# Patient Record
Sex: Male | Born: 1950
Health system: Southern US, Community
[De-identification: ages and names within clinical notes are randomized; demographics above are authoritative.]

## PROBLEM LIST (undated history)

## (undated) DIAGNOSIS — I10 Essential (primary) hypertension: Secondary | ICD-10-CM

## (undated) DIAGNOSIS — I2510 Atherosclerotic heart disease of native coronary artery without angina pectoris: Secondary | ICD-10-CM

## (undated) DIAGNOSIS — J8410 Pulmonary fibrosis, unspecified: Secondary | ICD-10-CM

## (undated) DIAGNOSIS — K8020 Calculus of gallbladder without cholecystitis without obstruction: Secondary | ICD-10-CM

## (undated) DIAGNOSIS — G4730 Sleep apnea, unspecified: Secondary | ICD-10-CM

## (undated) DIAGNOSIS — D126 Benign neoplasm of colon, unspecified: Secondary | ICD-10-CM

## (undated) DIAGNOSIS — B20 Human immunodeficiency virus [HIV] disease: Secondary | ICD-10-CM

## (undated) DIAGNOSIS — R9430 Abnormal result of cardiovascular function study, unspecified: Secondary | ICD-10-CM

## (undated) DIAGNOSIS — R002 Palpitations: Secondary | ICD-10-CM

## (undated) DIAGNOSIS — K7460 Unspecified cirrhosis of liver: Secondary | ICD-10-CM

## (undated) DIAGNOSIS — Z21 Asymptomatic human immunodeficiency virus [HIV] infection status: Secondary | ICD-10-CM

## (undated) DIAGNOSIS — Z9861 Coronary angioplasty status: Secondary | ICD-10-CM

## (undated) DIAGNOSIS — J984 Other disorders of lung: Secondary | ICD-10-CM

## (undated) DIAGNOSIS — N200 Calculus of kidney: Secondary | ICD-10-CM

## (undated) DIAGNOSIS — B1920 Unspecified viral hepatitis C without hepatic coma: Secondary | ICD-10-CM

## (undated) DIAGNOSIS — E785 Hyperlipidemia, unspecified: Secondary | ICD-10-CM

## (undated) DIAGNOSIS — E782 Mixed hyperlipidemia: Secondary | ICD-10-CM

## (undated) DIAGNOSIS — E119 Type 2 diabetes mellitus without complications: Secondary | ICD-10-CM

## (undated) DIAGNOSIS — R0602 Shortness of breath: Secondary | ICD-10-CM

## (undated) DIAGNOSIS — I350 Nonrheumatic aortic (valve) stenosis: Secondary | ICD-10-CM

## (undated) DIAGNOSIS — K648 Other hemorrhoids: Secondary | ICD-10-CM

## (undated) HISTORY — DX: Nonrheumatic aortic (valve) stenosis: I35.0

## (undated) HISTORY — DX: Palpitations: R00.2

## (undated) HISTORY — DX: Essential (primary) hypertension: I10

## (undated) HISTORY — DX: Unspecified cirrhosis of liver: K74.60

## (undated) HISTORY — DX: Hyperlipidemia, unspecified: E78.5

## (undated) HISTORY — DX: Other hemorrhoids: K64.8

## (undated) HISTORY — DX: Atherosclerotic heart disease of native coronary artery without angina pectoris: I25.10

## (undated) HISTORY — DX: Abnormal result of cardiovascular function study, unspecified: R94.30

## (undated) HISTORY — PX: POLYPECTOMY: SHX149

## (undated) HISTORY — DX: Coronary angioplasty status: Z98.61

## (undated) HISTORY — DX: Other disorders of lung: J98.4

## (undated) HISTORY — PX: PERCUTANEOUS CORONARY STENT INTERVENTION (PCI-S): SHX6016

## (undated) HISTORY — DX: Calculus of gallbladder without cholecystitis without obstruction: K80.20

## (undated) HISTORY — PX: CHOLECYSTECTOMY: SHX55

## (undated) HISTORY — DX: Mixed hyperlipidemia: E78.2

## (undated) HISTORY — DX: Shortness of breath: R06.02

## (undated) HISTORY — DX: Sleep apnea, unspecified: G47.30

## (undated) HISTORY — DX: Asymptomatic human immunodeficiency virus (hiv) infection status: Z21

## (undated) HISTORY — DX: Human immunodeficiency virus (HIV) disease: B20

## (undated) HISTORY — DX: Calculus of kidney: N20.0

## (undated) HISTORY — PX: OTHER SURGICAL HISTORY: SHX169

## (undated) HISTORY — DX: Type 2 diabetes mellitus without complications: E11.9

## (undated) HISTORY — DX: Benign neoplasm of colon, unspecified: D12.6

## (undated) HISTORY — PX: UMBILICAL HERNIA REPAIR: SHX196

## (undated) HISTORY — DX: Unspecified viral hepatitis C without hepatic coma: B19.20

---

## 2013-04-23 HISTORY — PX: CARDIAC CATHETERIZATION: SHX172

## 2013-05-02 HISTORY — PX: COLONOSCOPY: SHX174

## 2016-04-02 DIAGNOSIS — B20 Human immunodeficiency virus [HIV] disease: Secondary | ICD-10-CM | POA: Diagnosis not present

## 2016-04-06 DIAGNOSIS — E782 Mixed hyperlipidemia: Secondary | ICD-10-CM | POA: Diagnosis not present

## 2016-04-06 DIAGNOSIS — I340 Nonrheumatic mitral (valve) insufficiency: Secondary | ICD-10-CM | POA: Diagnosis not present

## 2016-04-06 DIAGNOSIS — I2510 Atherosclerotic heart disease of native coronary artery without angina pectoris: Secondary | ICD-10-CM | POA: Diagnosis not present

## 2016-04-06 DIAGNOSIS — I10 Essential (primary) hypertension: Secondary | ICD-10-CM | POA: Diagnosis not present

## 2016-04-13 DIAGNOSIS — E559 Vitamin D deficiency, unspecified: Secondary | ICD-10-CM | POA: Diagnosis not present

## 2016-04-13 DIAGNOSIS — B20 Human immunodeficiency virus [HIV] disease: Secondary | ICD-10-CM | POA: Diagnosis not present

## 2016-04-13 DIAGNOSIS — R739 Hyperglycemia, unspecified: Secondary | ICD-10-CM | POA: Diagnosis not present

## 2016-04-13 DIAGNOSIS — E782 Mixed hyperlipidemia: Secondary | ICD-10-CM | POA: Diagnosis not present

## 2016-05-06 DIAGNOSIS — Z0000 Encounter for general adult medical examination without abnormal findings: Secondary | ICD-10-CM | POA: Diagnosis not present

## 2016-05-06 DIAGNOSIS — E119 Type 2 diabetes mellitus without complications: Secondary | ICD-10-CM | POA: Diagnosis not present

## 2016-05-06 DIAGNOSIS — E785 Hyperlipidemia, unspecified: Secondary | ICD-10-CM | POA: Diagnosis not present

## 2016-05-06 DIAGNOSIS — Z7189 Other specified counseling: Secondary | ICD-10-CM | POA: Diagnosis not present

## 2016-05-06 DIAGNOSIS — B20 Human immunodeficiency virus [HIV] disease: Secondary | ICD-10-CM | POA: Diagnosis not present

## 2016-05-06 DIAGNOSIS — M2550 Pain in unspecified joint: Secondary | ICD-10-CM | POA: Diagnosis not present

## 2016-07-27 DIAGNOSIS — D225 Melanocytic nevi of trunk: Secondary | ICD-10-CM | POA: Diagnosis not present

## 2016-07-27 DIAGNOSIS — L918 Other hypertrophic disorders of the skin: Secondary | ICD-10-CM | POA: Diagnosis not present

## 2016-07-27 DIAGNOSIS — D485 Neoplasm of uncertain behavior of skin: Secondary | ICD-10-CM | POA: Diagnosis not present

## 2016-07-27 DIAGNOSIS — L821 Other seborrheic keratosis: Secondary | ICD-10-CM | POA: Diagnosis not present

## 2016-07-27 DIAGNOSIS — L919 Hypertrophic disorder of the skin, unspecified: Secondary | ICD-10-CM | POA: Diagnosis not present

## 2016-07-27 DIAGNOSIS — L570 Actinic keratosis: Secondary | ICD-10-CM | POA: Diagnosis not present

## 2016-07-27 DIAGNOSIS — L812 Freckles: Secondary | ICD-10-CM | POA: Diagnosis not present

## 2016-08-10 DIAGNOSIS — R5381 Other malaise: Secondary | ICD-10-CM | POA: Diagnosis not present

## 2016-08-10 DIAGNOSIS — E559 Vitamin D deficiency, unspecified: Secondary | ICD-10-CM | POA: Diagnosis not present

## 2016-08-10 DIAGNOSIS — E119 Type 2 diabetes mellitus without complications: Secondary | ICD-10-CM | POA: Diagnosis not present

## 2016-08-10 DIAGNOSIS — B20 Human immunodeficiency virus [HIV] disease: Secondary | ICD-10-CM | POA: Diagnosis not present

## 2016-08-28 DIAGNOSIS — E785 Hyperlipidemia, unspecified: Secondary | ICD-10-CM | POA: Diagnosis not present

## 2016-08-28 DIAGNOSIS — E119 Type 2 diabetes mellitus without complications: Secondary | ICD-10-CM | POA: Diagnosis not present

## 2016-08-28 DIAGNOSIS — B20 Human immunodeficiency virus [HIV] disease: Secondary | ICD-10-CM | POA: Diagnosis not present

## 2016-08-28 DIAGNOSIS — N401 Enlarged prostate with lower urinary tract symptoms: Secondary | ICD-10-CM | POA: Diagnosis not present

## 2016-10-08 DIAGNOSIS — G4733 Obstructive sleep apnea (adult) (pediatric): Secondary | ICD-10-CM | POA: Diagnosis not present

## 2016-10-08 DIAGNOSIS — Z113 Encounter for screening for infections with a predominantly sexual mode of transmission: Secondary | ICD-10-CM | POA: Diagnosis not present

## 2016-10-08 DIAGNOSIS — Z111 Encounter for screening for respiratory tuberculosis: Secondary | ICD-10-CM | POA: Diagnosis not present

## 2016-10-08 DIAGNOSIS — R011 Cardiac murmur, unspecified: Secondary | ICD-10-CM | POA: Diagnosis not present

## 2016-10-08 DIAGNOSIS — B20 Human immunodeficiency virus [HIV] disease: Secondary | ICD-10-CM | POA: Diagnosis not present

## 2016-10-22 DIAGNOSIS — I10 Essential (primary) hypertension: Secondary | ICD-10-CM | POA: Diagnosis not present

## 2016-10-22 DIAGNOSIS — I2510 Atherosclerotic heart disease of native coronary artery without angina pectoris: Secondary | ICD-10-CM | POA: Diagnosis not present

## 2016-10-22 DIAGNOSIS — R0602 Shortness of breath: Secondary | ICD-10-CM | POA: Diagnosis not present

## 2016-10-22 DIAGNOSIS — Z9861 Coronary angioplasty status: Secondary | ICD-10-CM | POA: Diagnosis not present

## 2016-10-26 DIAGNOSIS — I350 Nonrheumatic aortic (valve) stenosis: Secondary | ICD-10-CM | POA: Diagnosis not present

## 2016-10-26 DIAGNOSIS — I517 Cardiomegaly: Secondary | ICD-10-CM | POA: Diagnosis not present

## 2016-11-06 DIAGNOSIS — Z23 Encounter for immunization: Secondary | ICD-10-CM | POA: Diagnosis not present

## 2016-12-29 DIAGNOSIS — E119 Type 2 diabetes mellitus without complications: Secondary | ICD-10-CM | POA: Diagnosis not present

## 2016-12-29 DIAGNOSIS — R5381 Other malaise: Secondary | ICD-10-CM | POA: Diagnosis not present

## 2016-12-29 DIAGNOSIS — Z111 Encounter for screening for respiratory tuberculosis: Secondary | ICD-10-CM | POA: Diagnosis not present

## 2016-12-29 DIAGNOSIS — E559 Vitamin D deficiency, unspecified: Secondary | ICD-10-CM | POA: Diagnosis not present

## 2016-12-29 DIAGNOSIS — B20 Human immunodeficiency virus [HIV] disease: Secondary | ICD-10-CM | POA: Diagnosis not present

## 2016-12-29 DIAGNOSIS — E785 Hyperlipidemia, unspecified: Secondary | ICD-10-CM | POA: Diagnosis not present

## 2016-12-29 DIAGNOSIS — Z113 Encounter for screening for infections with a predominantly sexual mode of transmission: Secondary | ICD-10-CM | POA: Diagnosis not present

## 2017-01-25 DIAGNOSIS — M1712 Unilateral primary osteoarthritis, left knee: Secondary | ICD-10-CM | POA: Diagnosis not present

## 2017-01-25 DIAGNOSIS — M170 Bilateral primary osteoarthritis of knee: Secondary | ICD-10-CM | POA: Diagnosis not present

## 2017-01-25 DIAGNOSIS — M1711 Unilateral primary osteoarthritis, right knee: Secondary | ICD-10-CM | POA: Diagnosis not present

## 2017-04-02 ENCOUNTER — Other Ambulatory Visit: Payer: Medicare Other

## 2017-04-02 ENCOUNTER — Ambulatory Visit: Payer: Medicare Other

## 2017-04-02 ENCOUNTER — Other Ambulatory Visit (HOSPITAL_COMMUNITY)
Admission: RE | Admit: 2017-04-02 | Discharge: 2017-04-02 | Disposition: A | Discharge status: Home / Self Care | Payer: Medicare Other | Source: Ambulatory Visit | Attending: Family | Admitting: Family

## 2017-04-02 DIAGNOSIS — Z79899 Other long term (current) drug therapy: Secondary | ICD-10-CM

## 2017-04-02 DIAGNOSIS — B20 Human immunodeficiency virus [HIV] disease: Secondary | ICD-10-CM

## 2017-04-02 LAB — T-HELPER CELL (CD4) - (RCID CLINIC ONLY)
CD4 % Helper T Cell: 51 % (ref 33–55)
CD4 T Cell Abs: 690 /uL (ref 400–2700)

## 2017-04-03 LAB — URINALYSIS
BILIRUBIN URINE: NEGATIVE
Hgb urine dipstick: NEGATIVE
Ketones, ur: NEGATIVE
Leukocytes, UA: NEGATIVE
Nitrite: NEGATIVE
PROTEIN: NEGATIVE
Specific Gravity, Urine: 1.04 — ABNORMAL HIGH (ref 1.001–1.03)
pH: 6.5 (ref 5.0–8.0)

## 2017-04-03 LAB — URINE CYTOLOGY ANCILLARY ONLY
CHLAMYDIA, DNA PROBE: NEGATIVE
Neisseria Gonorrhea: NEGATIVE

## 2017-04-06 LAB — HIV-1 RNA ULTRAQUANT REFLEX TO GENTYP+

## 2017-04-07 LAB — CBC WITH DIFFERENTIAL/PLATELET
Basophils Absolute: 48 cells/uL (ref 0–200)
Basophils Relative: 1 %
EOS PCT: 2.9 %
Eosinophils Absolute: 139 cells/uL (ref 15–500)
HCT: 44.2 % (ref 38.5–50.0)
Hemoglobin: 15.4 g/dL (ref 13.2–17.1)
Lymphs Abs: 1258 cells/uL (ref 850–3900)
MCH: 32.1 pg (ref 27.0–33.0)
MCHC: 34.8 g/dL (ref 32.0–36.0)
MCV: 92.1 fL (ref 80.0–100.0)
MONOS PCT: 8.7 %
MPV: 10.6 fL (ref 7.5–12.5)
Neutro Abs: 2938 cells/uL (ref 1500–7800)
Neutrophils Relative %: 61.2 %
PLATELETS: 105 10*3/uL — AB (ref 140–400)
RBC: 4.8 10*6/uL (ref 4.20–5.80)
RDW: 13.3 % (ref 11.0–15.0)
TOTAL LYMPHOCYTE: 26.2 %
WBC mixed population: 418 cells/uL (ref 200–950)
WBC: 4.8 10*3/uL (ref 3.8–10.8)

## 2017-04-07 LAB — COMPLETE METABOLIC PANEL WITH GFR
AG Ratio: 1.5 (calc) (ref 1.0–2.5)
ALKALINE PHOSPHATASE (APISO): 105 U/L (ref 40–115)
ALT: 29 U/L (ref 9–46)
AST: 39 U/L — AB (ref 10–35)
Albumin: 3.8 g/dL (ref 3.6–5.1)
BUN: 17 mg/dL (ref 7–25)
CO2: 30 mmol/L (ref 20–32)
Calcium: 9.3 mg/dL (ref 8.6–10.3)
Chloride: 103 mmol/L (ref 98–110)
Creat: 0.7 mg/dL (ref 0.70–1.25)
GFR, Est African American: 114 mL/min/{1.73_m2} (ref >=60)
GFR, Est Non African American: 98 mL/min/{1.73_m2} (ref >=60)
GLOBULIN: 2.6 g/dL (ref 1.9–3.7)
GLUCOSE: 118 mg/dL — AB (ref 65–99)
Potassium: 4.3 mmol/L (ref 3.5–5.3)
SODIUM: 140 mmol/L (ref 135–146)
Total Bilirubin: 2.6 mg/dL — ABNORMAL HIGH (ref 0.2–1.2)
Total Protein: 6.4 g/dL (ref 6.1–8.1)

## 2017-04-07 LAB — QUANTIFERON-TB GOLD PLUS
MITOGEN-NIL: 8.4 [IU]/mL
NIL: 0.02 IU/mL
QuantiFERON-TB Gold Plus: NEGATIVE
TB1-NIL: 0.03 IU/mL
TB2-NIL: 0.03 IU/mL

## 2017-04-07 LAB — HEPATITIS B SURFACE ANTIGEN: Hepatitis B Surface Ag: NONREACTIVE

## 2017-04-07 LAB — LIPID PANEL
CHOL/HDL RATIO: 1.4 (calc) (ref <5.0)
CHOLESTEROL: 126 mg/dL (ref <200)
HDL: 90 mg/dL (ref >40)
LDL Cholesterol (Calc): 23 mg/dL (calc)
Non-HDL Cholesterol (Calc): 36 mg/dL (calc) (ref <130)
TRIGLYCERIDES: 47 mg/dL (ref <150)

## 2017-04-07 LAB — HEPATITIS C ANTIBODY
Hepatitis C Ab: REACTIVE — AB
SIGNAL TO CUT-OFF: 18 — ABNORMAL HIGH (ref <1.00)

## 2017-04-07 LAB — HEPATITIS B CORE ANTIBODY, TOTAL: Hep B Core Total Ab: REACTIVE — AB

## 2017-04-07 LAB — HCV RNA,QUANTITATIVE REAL TIME PCR
HCV QUANT LOG: NOT DETECTED {Log_IU}/mL
HCV RNA, PCR, QN: NOT DETECTED [IU]/mL

## 2017-04-07 LAB — RPR: RPR Ser Ql: NONREACTIVE

## 2017-04-07 LAB — HEPATITIS B SURFACE ANTIBODY,QUALITATIVE: Hep B S Ab: NONREACTIVE

## 2017-04-07 LAB — HLA B*5701: HLA-B 5701 W/RFLX HLA-B HIGH: NEGATIVE

## 2017-04-07 LAB — HEPATITIS A ANTIBODY, TOTAL: HEPATITIS A AB,TOTAL: REACTIVE — AB

## 2017-04-13 NOTE — Progress Notes (Signed)
Subjective:    Patient ID: Tyler Hendricks, male    DOB: 1950-12-20, 67 y.o.   MRN: [id number]  Chief Complaint  Patient presents with  . HIV Positive/AIDS  . Knee Pain  . Hypertension  . Diabetes    HPI:  Tyler Hendricks is a 67 y.o. male who presents today to establish care for HIV care from his previous provider in [place], [place].  1.) HIV - Tyler Hendricks has been HIV positive since August of 1989 when he was screened for an insurance physical. His presumed risk factor was a long history of heroin usage which he indicates he has not used since 1976. HIs CD4 count at the time was 510 and was not started on ART as his CD4 was greater than 500. Over the course of time he has been on several ART regimens including Zerit/Stauvadine-Epivir, Atripla, Complera, and Odefsey. He did have some increasing numbness and tingling of his distal lower extremities likely associated with the Zerit. He has been well controlled with his regimen of Odefsey. His previous medical records were reviewed in detail.   He continues to take the [facility] as prescribed and denies adverse side effects or missed dosages. Denies symptoms of OI including fevers, chills, headaches, nausea, vomiting, or diarrhea. Night sweats on occasion.   2.) Hypertension - Currently prescribed Hyzaar and atenolol. Reports taking the medication as prescribed and denies adverse side effects. Blood pressures at home are. Denies headache, changes in vision, chest pain, or shortness of breath.  3.) Type 2 diabetes - Currently prescribed Januvia and Synjardy. Reports taking the medications as prescribed and denies adverse side effects. Blood sugars have been  4.) Health maintenance - He has received 2 dosages of Pneumovax (06/22/14, 03/03/2013) and his last Tdap was 06/23/13. Previous tested positive for Hepatitis C and completed treatment with most recent lab work showing no Hep C RNA present.   5.) Chronic knee pain -  Previously diagnosed with  osteoarthritis of the bilateral knees. He was previously treated with cortisone injections and occasional percocet. Not currently on medications. Described as stiff in the morning and improves and then worsens towards the end of the day. Has increased levels of pain with weather.    Allergies  Allergen Reactions  . Lopressor [Metoprolol Tartrate] Rash      Outpatient Medications Prior to Visit  Medication Sig Dispense Refill  . aspirin 81 MG tablet Take 81 mg by mouth daily.    . Multiple Vitamin (MULTIVITAMIN) tablet Take 1 tablet by mouth daily.    Marland Kitchen omega-3 acid ethyl esters (LOVAZA) 1 g capsule Take 1 g by mouth daily. Take 3 caps orally Every Morning    . amLODipine (NORVASC) 2.5 MG tablet TK 1 T PO QD  2  . BREO ELLIPTA 200-25 MCG/INH AEPB INL 1 PUFF PO D  12  . clopidogrel (PLAVIX) 75 MG tablet Take 75 mg by mouth daily.  2  . JANUVIA 100 MG tablet TK 1 T PO QD  3  . losartan-hydrochlorothiazide (HYZAAR) 100-25 MG tablet Take by mouth daily.  3  . predniSONE (STERAPRED UNI-PAK 21 TAB) 10 MG (21) TBPK tablet FPD  0  . rosuvastatin (CRESTOR) 10 MG tablet Take 20 mg by mouth daily.  3  . SHINGRIX injection ADM 0.5ML IM UTD  0  . SYNJARDY XR 12.07-998 MG TB24 TK 2 TS PO QD  3  . VASCEPA 1 g CAPS TK 2 CS PO BID  11  . Vitamin D,  Ergocalciferol, (DRISDOL) 50000 units CAPS capsule TK 1 C PO WEEKLY  3  . atenolol (TENORMIN) 50 MG tablet Take 50 mg by mouth daily.  3  . oxyCODONE-acetaminophen (PERCOCET) 10-325 MG tablet TK 1 T PO QID PRN  0  . oxyCODONE-acetaminophen (PERCOCET/ROXICET) 5-325 MG tablet TK 1 T PO QID PRN  0   No facility-administered medications prior to visit.      Past Medical History:  Diagnosis Date  . CAD (coronary artery disease)   . Diabetes mellitus without complication ([place])   . Hepatitis C   . HIV infection ([place])   . Hyperlipidemia   . Hypertension   . Sleep apnea     Past Surgical History:  Procedure Laterality Date  . PERCUTANEOUS CORONARY  STENT INTERVENTION (PCI-S)       Family History  Problem Relation Age of Onset  . Emphysema Mother   . Heart attack Father 31     Social History   Socioeconomic History  . Marital status: Married    Spouse name: Not on file  . Number of children: 2  . Years of education: Not on file  . Highest education level: 10th grade  Social Needs  . Financial resource strain: Not on file  . Food insecurity - worry: Never true  . Food insecurity - inability: Never true  . Transportation needs - medical: No  . Transportation needs - non-medical: No  Occupational History  . Not on file  Tobacco Use  . Smoking status: Never Smoker  . Smokeless tobacco: Never Used  Substance and Sexual Activity  . Alcohol use: Yes    Frequency: Never    Comment: Occasional  . Drug use: No  . Sexual activity: Yes  Other Topics Concern  . Not on file  Social History Narrative  . Not on file      Review of Systems  Constitutional: Negative for chills, fatigue and fever.  Respiratory: Negative for cough, chest tightness, shortness of breath and wheezing.   Cardiovascular: Negative for chest pain, palpitations and leg swelling.  Gastrointestinal: Negative for abdominal distention, abdominal pain, anal bleeding, blood in stool, constipation, diarrhea and nausea.  Genitourinary: Negative for discharge, dysuria, flank pain, frequency, hematuria, penile pain, penile swelling, testicular pain and urgency.  Neurological: Negative for headaches.       Objective:    BP 122/87   Pulse 65   Temp 98.3 F (36.8 C) (Oral)   Ht 6\' 1"  (1.854 m)   Wt 220 lb (99.8 kg)   BMI 29.03 kg/m  Nursing note and vital signs reviewed.  Physical Exam  Constitutional: He is oriented to person, place, and time. He appears well-developed and well-nourished. No distress.  HENT:  Mouth/Throat: Oropharynx is clear and moist.  Neck: Neck supple.  Cardiovascular: Normal rate, regular rhythm, normal heart sounds and  intact distal pulses. Exam reveals no gallop and no friction rub.  No murmur heard. Pulmonary/Chest: Effort normal and breath sounds normal. No respiratory distress. He has no wheezes. He has no rales. He exhibits no tenderness.  Abdominal: Soft. Bowel sounds are normal. He exhibits no distension.  Musculoskeletal:  Bilateral knees - No obvious defomity, discoloration or edema. Range of motion normal. Distal pulses and sensation are intact and appropriate.   Lymphadenopathy:    He has no cervical adenopathy.  Neurological: He is alert and oriented to person, place, and time.  Skin: Skin is warm and dry.  Psychiatric: He has a normal mood and affect. His  behavior is normal. Judgment and thought content normal.        Assessment & Plan:   Problem List Items Addressed This Visit      Cardiovascular and Mediastinum   Essential hypertension    Well controlled today. Continue losartan-hctz and amlodipine. Encouraged to monitor blood pressure at home and follow low sodium diet. Follow up with primary care.       Relevant Medications   rosuvastatin (CRESTOR) 10 MG tablet   losartan-hydrochlorothiazide (HYZAAR) 100-25 MG tablet   VASCEPA 1 g CAPS   amLODipine (NORVASC) 2.5 MG tablet   aspirin 81 MG tablet   omega-3 acid ethyl esters (LOVAZA) 1 g capsule     Endocrine   Type 2 diabetes mellitus with hyperglycemia, without long-term current use of insulin (HCC)    Continue Synjardy and Januvia. Referred to primary care for further management.       Relevant Medications   rosuvastatin (CRESTOR) 10 MG tablet   JANUVIA 100 MG tablet   losartan-hydrochlorothiazide (HYZAAR) 100-25 MG tablet   SYNJARDY XR 12.07-998 MG TB24   aspirin 81 MG tablet   Other Relevant Orders   Ambulatory referral to Internal Medicine     Musculoskeletal and Integument   Primary osteoarthritis of both knees    Symptoms and exam consistent with primary osteoarthritis of bilateral knees. Previous cortisone  injections helped. Discussed possibility of hyaluronic acid injections with recommended follow-up with orthopedics/sports medicine. He is on Plavix which limits his anti-inflammatory medication due to increased risk of bleeding. Previously on Percocet. Will prescribe small dose of tramadol as needed 1 time. Continue non-pharmacological treatments.      Relevant Medications   traMADol (ULTRAM) 50 MG tablet   predniSONE (STERAPRED UNI-PAK 21 TAB) 10 MG (21) TBPK tablet   aspirin 81 MG tablet   Other Relevant Orders   Ambulatory referral to Internal Medicine     Other   HIV disease ([place]) - Primary    HIV is stable with current medication regimen of Odefsey. VL is undetectable and CD4 count of 690. No adverse side effects for evidence of opportunistic infection. RPR completed and negative. Due for a booster dosage of Pneumovax. Continue Odefsey with follow up CD4 count and VL in 6 months.       Relevant Medications   SHINGRIX injection   Other Relevant Orders   T-helper cell (CD4)- ([facility] clinic only)   HIV 1 RNA quant-no reflex-bld    Other Visit Diagnoses    Healthcare maintenance           I have discontinued Denney Juarez's atenolol, oxyCODONE-acetaminophen, and oxyCODONE-acetaminophen. I am also having him start on traMADol. Additionally, I am having him maintain his clopidogrel, rosuvastatin, SHINGRIX, JANUVIA, predniSONE, losartan-hydrochlorothiazide, VASCEPA, BREO ELLIPTA, Vitamin D (Ergocalciferol), SYNJARDY XR, amLODipine, aspirin, omega-3 acid ethyl esters, and multivitamin.   Meds ordered this encounter  Medications  . traMADol (ULTRAM) 50 MG tablet    Sig: Take 1 tablet (50 mg total) by mouth every 6 (six) hours as needed for severe pain.    Dispense:  10 tablet    Refill:  0    Order Specific Question:   Supervising Provider    Answer:   Carlyle Basques [[id number]]     Follow-up: Return in about 6 months (around 10/13/2017), or if symptoms worsen or fail to  improve.  Mauricio Po, [place] for Infectious Disease

## 2017-04-15 ENCOUNTER — Encounter: Payer: Self-pay | Admitting: Family

## 2017-04-15 ENCOUNTER — Ambulatory Visit (INDEPENDENT_AMBULATORY_CARE_PROVIDER_SITE_OTHER): Payer: Medicare Other | Admitting: Pharmacist

## 2017-04-15 ENCOUNTER — Ambulatory Visit (INDEPENDENT_AMBULATORY_CARE_PROVIDER_SITE_OTHER): Payer: Medicare Other | Admitting: Family

## 2017-04-15 VITALS — BP 122/87 | HR 65 | Temp 98.3°F | Ht 73.0 in | Wt 220.0 lb

## 2017-04-15 DIAGNOSIS — B20 Human immunodeficiency virus [HIV] disease: Secondary | ICD-10-CM | POA: Diagnosis not present

## 2017-04-15 DIAGNOSIS — M170 Bilateral primary osteoarthritis of knee: Secondary | ICD-10-CM | POA: Diagnosis not present

## 2017-04-15 DIAGNOSIS — E1165 Type 2 diabetes mellitus with hyperglycemia: Secondary | ICD-10-CM

## 2017-04-15 DIAGNOSIS — I10 Essential (primary) hypertension: Secondary | ICD-10-CM

## 2017-04-15 DIAGNOSIS — Z0000 Encounter for general adult medical examination without abnormal findings: Secondary | ICD-10-CM

## 2017-04-15 DIAGNOSIS — E118 Type 2 diabetes mellitus with unspecified complications: Secondary | ICD-10-CM | POA: Insufficient documentation

## 2017-04-15 MED ORDER — TRAMADOL HCL 50 MG PO TABS: 50.0000 mg | ORAL_TABLET | Freq: Four times a day (QID) | ORAL | 0 refills | Status: DC | PRN

## 2017-04-15 MED ORDER — EMTRICITAB-RILPIVIR-TENOFOV AF 200-25-25 MG PO TABS: 1.0000 | ORAL_TABLET | Freq: Every day | ORAL | 11 refills | Status: DC

## 2017-04-15 MED FILL — traMADol HCL 50 MG TABS: 50 | 2 days supply | Qty: 10 | Fill #0

## 2017-04-15 NOTE — Assessment & Plan Note (Signed)
Symptoms and exam consistent with primary osteoarthritis of bilateral knees. Previous cortisone injections helped. Discussed possibility of hyaluronic acid injections with recommended follow-up with orthopedics/sports medicine. He is on Plavix which limits his anti-inflammatory medication due to increased risk of bleeding. Previously on Percocet. Will prescribe small dose of tramadol as needed 1 time. Continue non-pharmacological treatments.

## 2017-04-15 NOTE — Patient Instructions (Signed)
Very nice to meet you!  We will continue your Odefsey.  Be sure to take it with a full meal of at least 300 calories and avoid antiacids when taking it.  Plan to follow up in 6 months or sooner if needed with blood work 2 weeks prior.   Use the tramadol as needed for breakthrough pain - recommend seeing primary care or sports medicine.

## 2017-04-15 NOTE — Assessment & Plan Note (Signed)
Well controlled today. Continue losartan-hctz and amlodipine. Encouraged to monitor blood pressure at home and follow low sodium diet. Follow up with primary care.

## 2017-04-15 NOTE — Assessment & Plan Note (Signed)
Continue Synjardy and Januvia. Referred to primary care for further management.

## 2017-04-15 NOTE — Progress Notes (Signed)
[place] for Infect[facility] Disease Pharmacy Visit  HPI: Tyler Hendricks is a 67 y.o. male who presents to the [place] clinic today to initiate care with Marya Amsler for his HIV infection.  Patient Active Problem List   Diagnosis Date Noted  . HIV disease ([place]) 04/15/2017  . Primary osteoarthritis of both knees 04/15/2017    Patient's Medications  New Prescriptions   EMTRICITABINE-RILPIVIR-TENOFOVIR AF (ODEFSEY) 200-25-25 MG TABS TABLET    Take 1 tablet by mouth daily with breakfast.   TRAMADOL (ULTRAM) 50 MG TABLET    Take 1 tablet (50 mg total) by mouth every 6 (six) hours as needed for severe pain.  Previous Medications   AMLODIPINE (NORVASC) 2.5 MG TABLET    TK 1 T PO QD   ASPIRIN 81 MG TABLET    Take 81 mg by mouth daily.   BREO ELLIPTA 200-25 MCG/INH AEPB    INL 1 PUFF PO D   CLOPIDOGREL (PLAVIX) 75 MG TABLET    Take 75 mg by mouth daily.   JANUVIA 100 MG TABLET    TK 1 T PO QD   LOSARTAN-HYDROCHLOROTHIAZIDE (HYZAAR) 100-25 MG TABLET    Take by mouth daily.   MULTIPLE VITAMIN (MULTIVITAMIN) TABLET    Take 1 tablet by mouth daily.   OMEGA-3 ACID ETHYL ESTERS (LOVAZA) 1 G CAPSULE    Take 1 g by mouth daily. Take 3 caps orally Every Morning   PREDNISONE (STERAPRED UNI-PAK 21 TAB) 10 MG (21) TBPK TABLET    FPD   ROSUVASTATIN (CRESTOR) 10 MG TABLET    Take 20 mg by mouth daily.   SHINGRIX INJECTION    ADM 0.5ML IM UTD   SYNJARDY XR 12.07-998 MG TB24    TK 2 TS PO QD   VASCEPA 1 G CAPS    TK 2 CS PO BID   VITAMIN D, ERGOCALCIFEROL, (DRISDOL) 50000 UNITS CAPS CAPSULE    TK 1 C PO WEEKLY  Modified Medications   No medications on file  Discontinued Medications   No medications on file    Allergies: Allergies  Allergen Reactions  . Lopressor [Metoprolol Tartrate] Rash    Past Medical History: No past medical history on file.  Social History: Social History   Socioeconomic History  . Marital status: Married    Spouse name: Not on file  . Number of children: 2  . Years of  education: Not on file  . Highest education level: 10th grade  Social Needs  . Financial resource strain: Not on file  . Food insecurity - worry: Never true  . Food insecurity - inability: Never true  . Transportation needs - medical: No  . Transportation needs - non-medical: No  Occupational History  . Not on file  Tobacco Use  . Smoking status: Never Smoker  . Smokeless tobacco: Never Used  Substance and Sexual Activity  . Alcohol use: Yes    Frequency: Never    Comment: Occasional  . Drug use: No  . Sexual activity: Yes  Other Topics Concern  . Not on file  Social History Narrative  . Not on file    Labs: HIV 1 RNA Quant (Copies/mL)  Date Value  04/02/2017 <20   CD4 T Cell Abs (/uL)  Date Value  04/02/2017 690   Hep B S Ab (no units)  Date Value  04/02/2017 NON-REACTIVE   Hepatitis B Surface Ag (no units)  Date Value  04/02/2017 NON-REACTIVE    Lipids:    Component Value Date/Time  CHOL 126 04/02/2017 1058   TRIG 47 04/02/2017 1058   HDL 90 04/02/2017 1058   CHOLHDL 1.4 04/02/2017 1058    Current HIV Regimen: Odefsey  Assessment: Tyler Hendricks is here today to initiate care with Marya Amsler, our ID NP, for his HIV infection.  He just transferred from [place] with his wife. He has been on [facility] for awhile and is having no issues with the medication.  His HIV viral load is undetectable.  He knows to take it with a full meal and to avoid PPIs or antacids.  I will send his [place] over to [facility] so we can coordinate care for him.  He has a few weeks left in his current bottle, so he does not need a refill yet.  Plan: - Continue Odefsey PO once daily with a meal - Send to [facility] - F/u with Marya Amsler in 6 months  Cassie L. Kuppelweiser, PharmD, [place], [place] for Infectious Disease 04/15/2017, 11:40 AM

## 2017-04-15 NOTE — Assessment & Plan Note (Signed)
HIV is stable with current medication regimen of Odefsey. VL is undetectable and CD4 count of 690. No adverse side effects for evidence of opportunistic infection. RPR completed and negative. Due for a booster dosage of Pneumovax. Continue Odefsey with follow up CD4 count and VL in 6 months.

## 2017-04-20 ENCOUNTER — Telehealth: Payer: Self-pay

## 2017-04-20 NOTE — Telephone Encounter (Signed)
Patient is calling to request a refill of Tramadol.  He says his knee is still hurting.   Please advise.   Laverle Patter, RN

## 2017-04-21 NOTE — Telephone Encounter (Signed)
Noted  

## 2017-04-21 NOTE — Telephone Encounter (Signed)
Patient called to advise he no longer needs the tramadol. He says his knees feel better today.

## 2017-04-26 ENCOUNTER — Encounter: Payer: Self-pay | Admitting: Behavioral Health

## 2017-05-10 ENCOUNTER — Telehealth: Payer: Self-pay | Admitting: *Deleted

## 2017-05-10 DIAGNOSIS — M170 Bilateral primary osteoarthritis of knee: Secondary | ICD-10-CM

## 2017-05-10 MED ORDER — TRAMADOL HCL 50 MG PO TABS: 50.0000 mg | ORAL_TABLET | Freq: Four times a day (QID) | ORAL | 0 refills | Status: DC | PRN

## 2017-05-10 NOTE — Addendum Note (Signed)
Addended by: Mauricio Po D on: 05/10/2017 01:06 PM   Modules accepted: Orders

## 2017-05-10 NOTE — Telephone Encounter (Signed)
error 

## 2017-05-10 NOTE — Telephone Encounter (Signed)
Called the patient to let him know Greg sent in refill for #20 tablets and will do no more. Patient picked up line but call dropped and when I called back he did not answer. Will inform if he calls back.

## 2017-05-10 NOTE — Telephone Encounter (Signed)
I will send in 1 additional prescription for Tramadol to his pharmacy. I recommend he contact either Orthopedics or [place] for additional follow up and treatment. No additional tramadol will be provided.

## 2017-05-10 NOTE — Telephone Encounter (Signed)
Patient had called several times this morning and left messages requesting refill on his Tramadol. Patient advised he understands the doctor said it was a 1 time fill but advised he has an appt set for April 2019 and just needs fill until then. Advised will ask the doctor and someone will give him a call back.

## 2017-05-11 ENCOUNTER — Encounter: Payer: Self-pay | Admitting: General Practice

## 2017-05-11 MED FILL — ODEFSEY 200-25-25 MG TABS: 200-25-25 | 30 days supply | Qty: 30 | Fill #0

## 2017-06-01 ENCOUNTER — Other Ambulatory Visit: Payer: Self-pay | Admitting: Family

## 2017-06-01 ENCOUNTER — Telehealth: Payer: Self-pay

## 2017-06-01 DIAGNOSIS — M170 Bilateral primary osteoarthritis of knee: Secondary | ICD-10-CM

## 2017-06-01 NOTE — Telephone Encounter (Signed)
Patient called for Tramadol refill. He states he has not established with a primary care physician and is in need of medication.   He has been told several times we can refill this medication but he continues to call the office.    Patient was informed we will not be refilling this script.  He will need to establish care with a primary care physician to get this medication.   Laverle Patter, RN

## 2017-06-09 MED FILL — ODEFSEY 200-25-25 MG TABS: 200-25-25 | 30 days supply | Qty: 30 | Fill #1

## 2017-06-24 ENCOUNTER — Ambulatory Visit (INDEPENDENT_AMBULATORY_CARE_PROVIDER_SITE_OTHER): Payer: Medicare Other | Admitting: Interventional Cardiology

## 2017-06-24 ENCOUNTER — Encounter: Payer: Self-pay | Admitting: Interventional Cardiology

## 2017-06-24 VITALS — BP 140/88 | HR 63 | Ht 73.0 in | Wt 231.8 lb

## 2017-06-24 DIAGNOSIS — I350 Nonrheumatic aortic (valve) stenosis: Secondary | ICD-10-CM

## 2017-06-24 DIAGNOSIS — I25118 Atherosclerotic heart disease of native coronary artery with other forms of angina pectoris: Secondary | ICD-10-CM | POA: Diagnosis not present

## 2017-06-24 DIAGNOSIS — E1165 Type 2 diabetes mellitus with hyperglycemia: Secondary | ICD-10-CM | POA: Diagnosis not present

## 2017-06-24 DIAGNOSIS — I10 Essential (primary) hypertension: Secondary | ICD-10-CM | POA: Diagnosis not present

## 2017-06-24 DIAGNOSIS — B20 Human immunodeficiency virus [HIV] disease: Secondary | ICD-10-CM

## 2017-06-24 NOTE — Progress Notes (Signed)
Cardiology Office Note   Date:  06/24/2017   ID:  Tyler Hendricks, DOB 04/18/1950, MRN [id number]  PCP:  Patient, No Pcp Per    No chief complaint on file.  CAD  Wt Readings from Last 3 Encounters:  06/24/17 231 lb 12.8 oz (105.1 kg)  04/15/17 220 lb (99.8 kg)       History of Present Illness: Tyler Hendricks is a 67 y.o. male who is being seen today for the evaluation of CAD at the request of [name].  Sx at the time of his stents were minimal.   He had multivessel stents placed in 2000 while in [place].  He has  Ah/o HIV as well, since 1989.  He is compliant with meds.  Viral load is nondetectable.  OM1 3.0 x 23 mm, left posterolateral 2.5 x 13 mm, diagonal 1 2.5 x 13, ostial diagonal 2.5 mm, proximal LAD- unknown size, mid LAD 3.0 x 13  - all BMS.    Last cath was 2005.  All stents were patent and there was moderate disease in the right PDA and right PLA.  In 2010, he had a monitor showing NSR with PVCs.   Denies : Chest pain. Dizziness. Leg edema. Nitroglycerin use. Orthopnea. Palpitations. Paroxysmal nocturnal dyspnea. Shortness of breath. Syncope.   Walking his dog is the most strenuous activity.   He has lost weight intentionally as well.    Past Medical History:  Diagnosis Date  . Abnormal result of cardiovascular function study, unspecified   . Atherosclerotic heart disease of native coronary artery without angina pectoris   . CAD (coronary artery disease)   . Coronary angioplasty status   . Diabetes mellitus without complication ([place])   . Essential (primary) hypertension   . Hepatitis C   . Hepatitis C   . HIV infection ([place])   . Hyperlipidemia   . Hypertension   . Mixed hyperlipidemia   . Palpitations   . Shortness of breath   . Sleep apnea     Past Surgical History:  Procedure Laterality Date  . CARDIAC CATHETERIZATION Left 04/2013  . chlecystectomy    . PERCUTANEOUS CORONARY STENT INTERVENTION (PCI-S)       Current Outpatient Medications    Medication Sig Dispense Refill  . amLODipine (NORVASC) 2.5 MG tablet Take 2.5 mg by mouth daily.    Marland Kitchen aspirin 81 MG tablet Take 81 mg by mouth daily.    Marland Kitchen atenolol (TENORMIN) 50 MG tablet Take 50 mg by mouth daily.  3  . clopidogrel (PLAVIX) 75 MG tablet Take 75 mg by mouth daily.  2  . emtricitabine-rilpivir-tenofovir AF (ODEFSEY) 200-25-25 MG TABS tablet Take 1 tablet by mouth daily with breakfast. 30 tablet 11  . emtricitabine-rilpivir-tenofovir DF (COMPLERA) 200-25-300 MG tablet Take 1 tablet by mouth daily.    . fluticasone furoate-vilanterol (BREO ELLIPTA) 200-25 MCG/INH AEPB Inhale 1 puff into the lungs daily.    Marland Kitchen losartan-hydrochlorothiazide (HYZAAR) 100-25 MG tablet Take 1 tablet by mouth daily.   3  . metFORMIN (GLUCOPHAGE) 1000 MG tablet Take 1,000 mg by mouth 2 (two) times daily with a meal.    . Multiple Vitamin (MULTIVITAMIN) tablet Take 1 tablet by mouth daily.    Marland Kitchen omega-3 acid ethyl esters (LOVAZA) 1 g capsule Take 1 g by mouth daily.     Marland Kitchen oxyCODONE-acetaminophen (PERCOCET) 10-325 MG tablet Take 1 tablet by mouth 4 (four) times daily as needed for pain.  0  . predniSONE (STERAPRED UNI-PAK 21  TAB) 10 MG (21) TBPK tablet FPD  0  . rosuvastatin (CRESTOR) 10 MG tablet Take 10 mg by mouth daily.   3  . SHINGRIX injection ADM 0.5ML IM UTD  0  . sitaGLIPtin (JANUVIA) 100 MG tablet Take 100 mg by mouth daily.    Marland Kitchen SYNJARDY XR 12.07-998 MG TB24 TK 2 TS PO QD  3  . traMADol (ULTRAM) 50 MG tablet Take 1 tablet (50 mg total) by mouth every 6 (six) hours as needed for severe pain. 20 tablet 0  . VASCEPA 1 g CAPS TK 2 CS PO BID  11  . Vitamin D, Ergocalciferol, (DRISDOL) 50000 units CAPS capsule TK 1 C PO WEEKLY  3   No current facility-administered medications for this visit.     Allergies:   Lopressor [metoprolol tartrate] and Integrilin [eptifibatide]    Social History:  The patient  reports that he has never smoked. He has never used smokeless tobacco. He reports that he drinks  alcohol. He reports that he does not use drugs.   Family History:  The patient's family history includes Emphysema in his mother; Heart attack (age of onset: 46) in his father.    ROS:  Please see the history of present illness.   Otherwise, review of systems are positive for occasional joint pain .   All other systems are reviewed and negative.    PHYSICAL EXAM: VS:  BP 140/88   Pulse 63   Ht 6\' 1"  (1.854 m)   Wt 231 lb 12.8 oz (105.1 kg)   SpO2 95%   BMI 30.58 kg/m  , BMI Body mass index is 30.58 kg/m. GEN: Well nourished, well developed, in no acute distress  HEENT: normal  Neck: no JVD, carotid bruits, or masses Cardiac: RRR; no murmurs, rubs, or gallops,no edema  Respiratory:  clear to auscultation bilaterally, normal work of breathing GI: soft, nontender, nondistended, + BS MS: no deformity or atrophy  Skin: warm and dry, no rash Neuro:  Strength and sensation are intact Psych: euthymic mood, full affect   EKG:   The ekg ordered today demonstrates NSR, RBBB   Recent Labs: 04/02/2017: ALT 29; BUN 17; Creat 0.70; Hemoglobin 15.4; Platelets 105; Potassium 4.3; Sodium 140   Lipid Panel    Component Value Date/Time   CHOL 126 04/02/2017 1058   TRIG 47 04/02/2017 1058   HDL 90 04/02/2017 1058   CHOLHDL 1.4 04/02/2017 1058   LDLCALC 23 04/02/2017 1058     Other studies Reviewed: Additional studies/ records that were reviewed today with results demonstrating: Records from [place] reviewed..   ASSESSMENT AND PLAN:  1. CAD: No angina on medical therapy.  Known moderate branch vessel disease from 2005, he has been medically managed. LDL 23 in 2019. 2. HTN: Blood pressure elevated today.  I asked him to check blood pressures either at a drugstore or at home.  If blood pressure is consistently over 130/80, he should have his amlodipine increased to 5 mg daily.  He was hesitant to make that change today.  Given his diabetes, a lower blood pressure would help preserve his  kidney function. 3. HIV: Managed at the ID clinic.  4. DM: COntinue management per PMD.   5. Aortic stenosis: Moderate in 8/18.      Current medicines are reviewed at length with the patient today.  The patient concerns regarding his medicines were addressed.  The following changes have been made:  No change  Labs/ tests ordered today include:  Orders Placed This Encounter  Procedures  . EKG 12-Lead    Recommend 150 minutes/week of aerobic exercise Low fat, low carb, high fiber diet recommended  Disposition:   FU in 6 months   Signed, Larae Grooms, MD  06/24/2017 12:26 PM    [facility] [address], [place], [place]  [address] Phone: [phone]; Fax: [phone]

## 2017-06-24 NOTE — Patient Instructions (Signed)
Medication Instructions:  Your physician recommends that you continue on your current medications as directed. Please refer to the Current Medication list given to you today.   Labwork: None ordered  Testing/Procedures: None ordered  Follow-Up: Your physician wants you to follow-up in: 6 months with Dr. Irish Lack. You will receive a reminder letter in the mail two months in advance. If you don't receive a letter, please call our office to schedule the follow-up appointment.   Any Other Special Instructions Will Be Listed Below (If Applicable).  Continue to monitor your blood pressure at home and let us know if it consistently remains above 140/90.   If you need a refill on your cardiac medications before your next appointment, please call your pharmacy.

## 2017-07-07 ENCOUNTER — Other Ambulatory Visit: Payer: Self-pay | Admitting: Pharmacist Clinician (PhC)/ Clinical Pharmacy Specialist

## 2017-07-08 MED FILL — ODEFSEY 200-25-25 MG TABS: 200-25-25 | 30 days supply | Qty: 30 | Fill #2

## 2017-08-05 MED FILL — ODEFSEY 200-25-25 MG TABS: 200-25-25 | 30 days supply | Qty: 30 | Fill #3

## 2017-08-25 ENCOUNTER — Other Ambulatory Visit (INDEPENDENT_AMBULATORY_CARE_PROVIDER_SITE_OTHER): Payer: Medicare Other

## 2017-08-25 ENCOUNTER — Ambulatory Visit (INDEPENDENT_AMBULATORY_CARE_PROVIDER_SITE_OTHER): Payer: Medicare Other | Admitting: Nurse Practitioner

## 2017-08-25 ENCOUNTER — Encounter: Payer: Self-pay | Admitting: Nurse Practitioner

## 2017-08-25 VITALS — BP 126/68 | HR 67 | Temp 98.2°F | Resp 16 | Ht 73.0 in | Wt 211.0 lb

## 2017-08-25 DIAGNOSIS — R202 Paresthesia of skin: Secondary | ICD-10-CM

## 2017-08-25 DIAGNOSIS — J45909 Unspecified asthma, uncomplicated: Secondary | ICD-10-CM | POA: Diagnosis not present

## 2017-08-25 DIAGNOSIS — E1165 Type 2 diabetes mellitus with hyperglycemia: Secondary | ICD-10-CM | POA: Diagnosis not present

## 2017-08-25 DIAGNOSIS — R252 Cramp and spasm: Secondary | ICD-10-CM

## 2017-08-25 DIAGNOSIS — E559 Vitamin D deficiency, unspecified: Secondary | ICD-10-CM | POA: Diagnosis not present

## 2017-08-25 DIAGNOSIS — G8929 Other chronic pain: Secondary | ICD-10-CM | POA: Diagnosis not present

## 2017-08-25 DIAGNOSIS — M25561 Pain in right knee: Secondary | ICD-10-CM | POA: Diagnosis not present

## 2017-08-25 DIAGNOSIS — M25562 Pain in left knee: Secondary | ICD-10-CM | POA: Diagnosis not present

## 2017-08-25 LAB — COMPREHENSIVE METABOLIC PANEL
ALT: 32 U/L (ref 0–53)
AST: 36 U/L (ref 0–37)
Albumin: 4 g/dL (ref 3.5–5.2)
Alkaline Phosphatase: 111 U/L (ref 39–117)
BILIRUBIN TOTAL: 2.6 mg/dL — AB (ref 0.2–1.2)
BUN: 12 mg/dL (ref 6–23)
CHLORIDE: 103 meq/L (ref 96–112)
CO2: 31 meq/L (ref 19–32)
CREATININE: 0.68 mg/dL (ref 0.40–1.50)
Calcium: 9.9 mg/dL (ref 8.4–10.5)
GFR: 123.65 mL/min (ref >60.00)
GLUCOSE: 127 mg/dL — AB (ref 70–99)
Potassium: 3.9 mEq/L (ref 3.5–5.1)
Sodium: 140 mEq/L (ref 135–145)
Total Protein: 6.8 g/dL (ref 6.0–8.3)

## 2017-08-25 LAB — HEMOGLOBIN A1C: HEMOGLOBIN A1C: 6 % (ref 4.6–6.5)

## 2017-08-25 LAB — MAGNESIUM: Magnesium: 1.9 mg/dL (ref 1.5–2.5)

## 2017-08-25 LAB — VITAMIN B12: Vitamin B-12: 536 pg/mL (ref 211–911)

## 2017-08-25 LAB — VITAMIN D 25 HYDROXY (VIT D DEFICIENCY, FRACTURES): VITD: 54.68 ng/mL (ref 30.00–100.00)

## 2017-08-25 NOTE — Assessment & Plan Note (Signed)
He is not currently requesting percocet refill We discussed referral to pain management for further fills of percocet ,but he declines I have requested his records from prior PCP to determine need for continued percocet and consider alternatives to treat his pain

## 2017-08-25 NOTE — Patient Instructions (Addendum)
Please head downstairs for lab work/x-rays. If any of your test results are critically abnormal, you will be contacted right away. Your results may be released to your MyChart for viewing before I am able to provide you with my response. I will contact you within a week about your test results and any recommendations for abnormalities.  Please plan to return in 6 months for a diabetes follow up, or sooner if needed.  Leg Cramps Leg cramps occur when a muscle or muscles tighten and you have no control over this tightening (involuntary muscle contraction). Muscle cramps can develop in any muscle, but the most common place is in the calf muscles of the leg. Those cramps can occur during exercise or when you are at rest. Leg cramps are painful, and they may last for a few seconds to a few minutes. Cramps may return several times before they finally stop. Usually, leg cramps are not caused by a serious medical problem. In many cases, the cause is not known. Some common causes include:  Overexertion.  Overuse from repetitive motions, or doing the same thing over and over.  Remaining in a certain position for a long period of time.  Improper preparation, form, or technique while performing a sport or an activity.  Dehydration.  Injury.  Side effects of some medicines.  Abnormally low levels of the salts and ions in your blood (electrolytes), especially potassium and calcium. These levels could be low if you are taking water pills (diuretics) or if you are pregnant.  Follow these instructions at home: Watch your condition for any changes. Taking the following actions may help to lessen any discomfort that you are feeling:  Stay well-hydrated. Drink enough fluid to keep your urine clear or pale yellow.  Try massaging, stretching, and relaxing the affected muscle. Do this for several minutes at a time.  For tight or tense muscles, use a warm towel, heating pad, or hot shower water directed to  the affected area.  If you are sore or have pain after a cramp, applying ice to the affected area may relieve discomfort. ? Put ice in a plastic bag. ? Place a towel between your skin and the bag. ? Leave the ice on for 20 minutes, 2-3 times per day.  Avoid strenuous exercise for several days if you have been having frequent leg cramps.  Make sure that your diet includes the essential minerals for your muscles to work normally.  Take medicines only as directed by your health care provider.  Contact a health care provider if:  Your leg cramps get more severe or more frequent, or they do not improve over time.  Your foot becomes cold, numb, or blue. This information is not intended to replace advice given to you by your health care provider. Make sure you discuss any questions you have with your health care provider. Document Released: 04/16/2004 Document Revised: 08/15/2015 Document Reviewed: 02/14/2014 Elsevier Interactive Patient Education  [employer].

## 2017-08-25 NOTE — Assessment & Plan Note (Signed)
Stable Continue breo  F/U for new, worsening symptoms

## 2017-08-25 NOTE — Progress Notes (Signed)
Name: Tyler Hendricks   MRN: [id number]    DOB: [birth year]   Date:08/25/2017       Progress Note  Subjective  Chief Complaint  Chief Complaint  Patient presents with  . Establish Care    leg cramps, diabetes    HPI Tyler Hendricks is here today to establish care with me as a new PCP, recently moved to [facility] state from [place]. Prior to his visit with me today, He has already established with infectious disease for routine F/U of HIV, and cardiology for routine F/U of CAD, HTN, non rheumatic aortic valve stenosis in the [place] area. He is requesting evaluation of leg cramps today We will also follow up on his diabetes, asthma,  chronic pain, and vitamin D deficiency today, problems managed by prior PCP.  Diabetes- maintained on januvia 100 daily, synjardy XR 12.07-998 2 tabs daily, metformin 1000 PO BID. Reports daily medication compliance without noted adverse medication effects. Reports he does not routinely check his blood sugar readings at home  Denies tremor, diaphoresis, polyuria, polydipsia, polyphagia.   No results found for: HGBA1C  Vitamin D deficiency- Started on Vitamin D 50,000 IU once weekly some time ago for vitamin D deficiency, does not recall how long he has been taking this dosage  Asthma- Maintained on breo once daily for history of allergic type asthma He reports he experiences occasional cough and allergy symptoms but his symptoms seem well controlled with daily breo Denies fevers, shortness of breath.  Chronic knee pain- Maintained on oxycodone-acetaminophen PRN for chronic knee pain by prior provider He reports taking oxycodone as prescribed sometimes 2-3 times a day, sometimes none a day Notices his pain is worse in certain conditions, like rainy cold weather, and that is usually when he takes the medication He says he does not take the oxycodone-acetaminophen unless he is in severe pain, and he has not found any other medication in the past that helps his pain  Leg  cramps- This is a new problem He has noticed cramping to BLE over the past few months ,occurring at night He has woken up in the morning at times with his feel "Curled up." His wife told him he seems restless at night He denies weakness, chest pain, erythema or bruising He reports occasional mild BLE edema, seems worse in the heat He tries to stay hydrated and says he drinks "plenty of water"   Patient Active Problem List   Diagnosis Date Noted  . HIV disease ([place]) 04/15/2017  . Primary osteoarthritis of both knees 04/15/2017  . Type 2 diabetes mellitus with hyperglycemia, without long-term current use of insulin ([place]) 04/15/2017  . Essential hypertension 04/15/2017    Past Surgical History:  Procedure Laterality Date  . CARDIAC CATHETERIZATION Left 04/2013  . chlecystectomy    . PERCUTANEOUS CORONARY STENT INTERVENTION ([facility])      Family History  Problem Relation Age of Onset  . Emphysema Mother        pulmonary  . Heart attack Father 62    Social History   Socioeconomic History  . Marital status: Married    Spouse name: Not on file  . Number of children: 2  . Years of education: Not on file  . Highest education level: 10th grade  Occupational History  . Not on file  Social Needs  . Financial resource strain: Not on file  . Food insecurity:    Worry: Never true    Inability: Never true  . Transportation needs:  Medical: No    Non-medical: No  Tobacco Use  . Smoking status: Never Smoker  . Smokeless tobacco: Never Used  Substance and Sexual Activity  . Alcohol use: Yes    Frequency: Never    Comment: Occasional  . Drug use: No  . Sexual activity: Yes  Lifestyle  . Physical activity:    Days per week: Not on file    Minutes per session: Not on file  . Stress: Not on file  Relationships  . Social connections:    Talks on phone: Not on file    Gets together: Not on file    Attends religious service: Not on file    Active member of club or  organization: Not on file    Attends meetings of clubs or organizations: Not on file    Relationship status: Not on file  . Intimate partner violence:    Fear of current or ex partner: No    Emotionally abused: No    Physically abused: No    Forced sexual activity: No  Other Topics Concern  . Not on file  Social History Narrative  . Not on file     Current Outpatient Medications:  .  amLODipine (NORVASC) 2.5 MG tablet, Take 2.5 mg by mouth daily., Disp: , Rfl:  .  aspirin 81 MG tablet, Take 81 mg by mouth daily., Disp: , Rfl:  .  atenolol (TENORMIN) 50 MG tablet, Take 50 mg by mouth daily., Disp: , Rfl: 3 .  clopidogrel (PLAVIX) 75 MG tablet, Take 75 mg by mouth daily., Disp: , Rfl: 2 .  emtricitabine-rilpivir-tenofovir AF (ODEFSEY) 200-25-25 MG TABS tablet, Take 1 tablet by mouth daily with breakfast., Disp: 30 tablet, Rfl: 11 .  fluticasone furoate-vilanterol (BREO ELLIPTA) 200-25 MCG/INH AEPB, Inhale 1 puff into the lungs daily., Disp: , Rfl:  .  losartan-hydrochlorothiazide (HYZAAR) 100-25 MG tablet, Take 1 tablet by mouth daily. , Disp: , Rfl: 3 .  metFORMIN (GLUCOPHAGE) 1000 MG tablet, Take 1,000 mg by mouth 2 (two) times daily with a meal., Disp: , Rfl:  .  Multiple Vitamin (MULTIVITAMIN) tablet, Take 1 tablet by mouth daily., Disp: , Rfl:  .  omega-3 acid ethyl esters (LOVAZA) 1 g capsule, Take 1 g by mouth daily. , Disp: , Rfl:  .  oxyCODONE-acetaminophen (PERCOCET) 10-325 MG tablet, Take 1 tablet by mouth 4 (four) times daily as needed for pain., Disp: , Rfl: 0 .  predniSONE (STERAPRED UNI-PAK 21 TAB) 10 MG (21) TBPK tablet, FPD, Disp: , Rfl: 0 .  rosuvastatin (CRESTOR) 10 MG tablet, Take 10 mg by mouth daily. , Disp: , Rfl: 3 .  SHINGRIX injection, ADM 0.5ML IM UTD, Disp: , Rfl: 0 .  sitaGLIPtin (JANUVIA) 100 MG tablet, Take 100 mg by mouth daily., Disp: , Rfl:  .  SYNJARDY XR 12.07-998 MG TB24, TK 2 TS PO QD, Disp: , Rfl: 3 .  VASCEPA 1 g CAPS, TK 2 CS PO BID, Disp: , Rfl:  11 .  Vitamin D, Ergocalciferol, (DRISDOL) 50000 units CAPS capsule, TK 1 C PO WEEKLY, Disp: , Rfl: 3  Allergies  Allergen Reactions  . Lopressor [Metoprolol Tartrate] Rash and Other (See Comments)    Bleeding (non-specific)   . Integrilin [Eptifibatide] Other (See Comments)    Bleeding (non-specific)     Review of Systems  Constitutional: Negative for diaphoresis and fever.  HENT: Negative for congestion.   Respiratory: Positive for cough. Negative for shortness of breath.   Cardiovascular: Negative  for chest pain.  Musculoskeletal: Positive for joint pain.  Skin: Negative for rash.  Neurological: Negative for tremors and weakness.  Endo/Heme/Allergies: Positive for environmental allergies. Negative for polydipsia. Does not bruise/bleed easily.    Objective  Vitals:   08/25/17 1343  BP: 126/68  Pulse: 67  Resp: 16  Temp: 98.2 F (36.8 C)  TempSrc: Oral  SpO2: 95%  Weight: 211 lb (95.7 kg)  Height: 6\' 1"  (1.854 m)    Body mass index is 27.84 kg/m.  Physical Exam Vital signs reviewed. Constitutional: Patient appears well-developed and well-nourished. No distress.  HENT: Head: Normocephalic and atraumatic.  Nose: Nose normal. Mouth/Throat: Oropharynx is clear and moist. No oropharyngeal exudate.  Eyes: Conjunctivae and EOM are normal. Pupils are equal, round, and reactive to light. No scleral icterus.  Neck: Normal range of motion. Neck supple.  Cardiovascular: Normal rate, regular rhythm and normal heart sounds.  No murmur heard. No BLE edema. Distal pulses intact. Pulmonary/Chest: Effort normal and breath sounds normal. No respiratory distress. Musculoskeletal: Normal range of motion. No gross deformities Neurological: He is alert and oriented to person, place, and time. Coordination, balance, strength, speech and gait are normal.  Skin: Skin is warm and dry. No rash noted. No erythema.  Psychiatric: Patient has a normal mood and affect. behavior is normal.  Judgment and thought content normal.   PHQ2/9: Depression screen PHQ 2/9 04/15/2017  Decreased Interest 0  Down, Depressed, Hopeless 0  PHQ - 2 Score 0    Fall Risk: Fall Risk  04/15/2017  Falls in the past year? No    Assessment & Plan RTC in 6 months for F/U: DM, CPE if stable  Leg cramps, Paresthesia Discussed home management of leg cramps including adequate hydration, daily stretching, and return precautions and printed additional information on AVS Update labs  F/U with further recommendations pending lab results - Vitamin B12; Future - Comprehensive metabolic panel; Future - VITAMIN D 25 Hydroxy (Vit-D Deficiency, Fractures); Future - Magnesium; Future

## 2017-08-25 NOTE — Assessment & Plan Note (Signed)
Continue current meds Update A1c F/U with further recommendations pending lab results - Hemoglobin A1c; Future

## 2017-08-25 NOTE — Assessment & Plan Note (Signed)
Update labs F/U with further recommendations pending lab results - VITAMIN D 25 Hydroxy (Vit-D Deficiency, Fractures); Future 

## 2017-08-31 ENCOUNTER — Other Ambulatory Visit: Payer: Self-pay | Admitting: Family

## 2017-08-31 ENCOUNTER — Other Ambulatory Visit: Payer: Self-pay | Admitting: Nurse Practitioner

## 2017-08-31 ENCOUNTER — Telehealth: Payer: Self-pay | Admitting: Nurse Practitioner

## 2017-08-31 DIAGNOSIS — M25562 Pain in left knee: Principal | ICD-10-CM

## 2017-08-31 DIAGNOSIS — G8929 Other chronic pain: Secondary | ICD-10-CM

## 2017-08-31 DIAGNOSIS — M25561 Pain in right knee: Principal | ICD-10-CM

## 2017-08-31 DIAGNOSIS — R17 Unspecified jaundice: Secondary | ICD-10-CM

## 2017-08-31 MED ORDER — TRAMADOL HCL 50 MG PO TABS: 50.0000 mg | ORAL_TABLET | Freq: Three times a day (TID) | ORAL | 0 refills | Status: DC | PRN

## 2017-08-31 NOTE — Telephone Encounter (Signed)
I have sent a prescription for tramadol 50mg  every 8 hours as needed for pain. Also please let him know that his vitamin D level is back to normal. I would like for him to stop the vitamin D 50,000 once weekly, and start over the counter vitamin D 1000 IU daily. Please have him return in about 3 months for a follow up visit so I can see how he is doing and recheck his vitamin D and A1c levels.

## 2017-09-01 ENCOUNTER — Other Ambulatory Visit: Payer: Self-pay | Admitting: Family

## 2017-09-01 DIAGNOSIS — G8929 Other chronic pain: Secondary | ICD-10-CM

## 2017-09-01 DIAGNOSIS — M25561 Pain in right knee: Principal | ICD-10-CM

## 2017-09-01 DIAGNOSIS — M25562 Pain in left knee: Principal | ICD-10-CM

## 2017-09-01 MED ORDER — TRAMADOL HCL 50 MG PO TABS: 50.0000 mg | ORAL_TABLET | Freq: Three times a day (TID) | ORAL | 0 refills | Status: DC | PRN

## 2017-09-01 MED FILL — ODEFSEY 200-25-25 MG TABS: 200-25-25 | 30 days supply | Qty: 30 | Fill #4

## 2017-09-01 NOTE — Telephone Encounter (Signed)
LVM letting pt know response below. Can you please resend tramadol to [facility] on [address] and I will cancel the rx that was send to the one on [address]? Thank you!

## 2017-09-22 ENCOUNTER — Ambulatory Visit
Admission: RE | Admit: 2017-09-22 | Discharge: 2017-09-22 | Disposition: A | Discharge status: Home / Self Care | Payer: Medicare Other | Source: Ambulatory Visit | Attending: Family | Admitting: Family

## 2017-09-22 ENCOUNTER — Other Ambulatory Visit: Payer: Self-pay | Admitting: Family

## 2017-09-22 ENCOUNTER — Other Ambulatory Visit: Payer: Self-pay | Admitting: Nurse Practitioner

## 2017-09-22 DIAGNOSIS — R17 Unspecified jaundice: Secondary | ICD-10-CM

## 2017-09-22 DIAGNOSIS — K7460 Unspecified cirrhosis of liver: Secondary | ICD-10-CM

## 2017-09-22 DIAGNOSIS — G8929 Other chronic pain: Secondary | ICD-10-CM

## 2017-09-22 DIAGNOSIS — M25562 Pain in left knee: Principal | ICD-10-CM

## 2017-09-22 DIAGNOSIS — M25561 Pain in right knee: Principal | ICD-10-CM

## 2017-09-22 MED ORDER — TRAMADOL HCL 50 MG PO TABS: 50.0000 mg | ORAL_TABLET | Freq: Three times a day (TID) | ORAL | 0 refills | Status: DC | PRN

## 2017-09-22 NOTE — Telephone Encounter (Signed)
Tramadol refill Last OV:08/25/17 Last refill:09/01/17 30 tab/0 refill OBO:[id number] Pharmacy: [facility] [place], [place] AT [address] [phone] (Phone) [phone] (Fax)

## 2017-09-22 NOTE — Telephone Encounter (Signed)
Copied from [place] [id number]. Topic: Quick Communication - Rx Refill/Question >> Sep 22, 2017 12:43 PM Synthia Innocent wrote: Medication: traMADol (ULTRAM) 50 MG tablet   Has the patient contacted their pharmacy? Yes.   (Agent: If no, request that the patient contact the pharmacy for the refill.) (Agent: If yes, when and what did the pharmacy advise?)  Preferred Pharmacy (with phone number or street name): [place]  Agent: Please be advised that RX refills may take up to 3 business days. We ask that you follow-up with your pharmacy.

## 2017-10-07 MED FILL — ODEFSEY 200-25-25 MG TABS: 200-25-25 | 30 days supply | Qty: 30 | Fill #5

## 2017-10-11 ENCOUNTER — Other Ambulatory Visit: Payer: Medicare Other

## 2017-10-11 DIAGNOSIS — B20 Human immunodeficiency virus [HIV] disease: Secondary | ICD-10-CM | POA: Diagnosis not present

## 2017-10-12 LAB — T-HELPER CELL (CD4) - (RCID CLINIC ONLY)
CD4 T CELL HELPER: 40 % (ref 33–55)
CD4 T Cell Abs: 440 /uL (ref 400–2700)

## 2017-10-13 LAB — HIV-1 RNA QUANT-NO REFLEX-BLD
HIV 1 RNA Quant: <20 copies/mL
HIV-1 RNA Quant, Log: <1.3 Log copies/mL

## 2017-10-15 ENCOUNTER — Other Ambulatory Visit: Payer: Self-pay | Admitting: Family

## 2017-10-15 DIAGNOSIS — K7460 Unspecified cirrhosis of liver: Secondary | ICD-10-CM

## 2017-10-25 ENCOUNTER — Encounter: Payer: Self-pay | Admitting: Family

## 2017-10-25 ENCOUNTER — Ambulatory Visit (INDEPENDENT_AMBULATORY_CARE_PROVIDER_SITE_OTHER): Payer: Medicare Other | Admitting: Family

## 2017-10-25 VITALS — BP 150/84 | HR 61 | Temp 98.0°F | Wt 219.0 lb

## 2017-10-25 DIAGNOSIS — H9313 Tinnitus, bilateral: Secondary | ICD-10-CM

## 2017-10-25 DIAGNOSIS — B20 Human immunodeficiency virus [HIV] disease: Secondary | ICD-10-CM | POA: Diagnosis not present

## 2017-10-25 DIAGNOSIS — H9319 Tinnitus, unspecified ear: Secondary | ICD-10-CM | POA: Insufficient documentation

## 2017-10-25 DIAGNOSIS — G4733 Obstructive sleep apnea (adult) (pediatric): Secondary | ICD-10-CM | POA: Diagnosis not present

## 2017-10-25 DIAGNOSIS — I25118 Atherosclerotic heart disease of native coronary artery with other forms of angina pectoris: Secondary | ICD-10-CM

## 2017-10-25 DIAGNOSIS — I10 Essential (primary) hypertension: Secondary | ICD-10-CM | POA: Diagnosis not present

## 2017-10-25 DIAGNOSIS — E1165 Type 2 diabetes mellitus with hyperglycemia: Secondary | ICD-10-CM

## 2017-10-25 NOTE — Patient Instructions (Addendum)
Nice to see you!  Please continue to take your medications as prescribed.  Start wearing your CPAP. If you need a referral, please let me know.  Plan to follow up in 6 months or sooner if needed with lab work 1-2 weeks prior to appointment.  A referral to ENT has been sent for the ringing in your ears. If you do not hear back from our office or the ENT office in 2 weeks, please let us know.

## 2017-10-25 NOTE — Assessment & Plan Note (Signed)
Tyler Hendricks has poorly controlled sleep apnea as he does not currently wear his CPAP at night. He has noted increased levels of drowsiness in the afternoon and even at the dinner table. He declines referral to sleep medicine presently. Encouraged him to wear his CPAP at night as this is an additional risk factor for coronary artery disease as well as heart failure.

## 2017-10-25 NOTE — Progress Notes (Signed)
Subjective:    Patient ID: Tyler Hendricks, male    DOB: 02-27-1951, 67 y.o.   MRN: [id number]  Chief Complaint  Patient presents with  . Follow-up    6 months + Pt stated  having ringing ears/humming sound---2 months.     HPI:  Tyler Hendricks is a 67 y.o. male who presents today for routine follow up of his HIV disease.   1.) HIV - Tyler Hendricks was last seen in the office on 04/15/17. He was well maintained on Odefsey with a viral load that was undetectable and CD4 count of 690. His most recent blood work completed on 10/11/17 with a viral load that continued to be undetectable and a CD4 count of 440. Kidney, liver and electrolytes appear within the normal ranges.  Tyler Hendricks has been taking his Odefsey as prescribed with no adverse side effects. No missed dosages. He has no problems taking or obtaining his medications. Denies fevers, chills, night sweats, headaches, changes in vision, neck pain/stiffness, nausea, diarrhea, vomiting, lesions or rashes.   2.) Tinnitus - This is a new problem. Associated symptom of ringing located in his posterior head/ears has been going on for several years. He describes listening to headphones when he was younger at very loud volumes. No fevers, chills, ear pain, or discharge. Would like to be further evaluated at ENT.   3.) Sleep apnea - Tyler Hendricks was previously diagnosed with sleep apena and has his CPAP machine at home and not currently using it. He has had increased fatigue recently and describes falling asleep in the afternoons and also at the dinner table. He has not had new equipment in the last 2 years and is not currently followed by a sleep medicine provider. He has previously not really liked the mask and is not a nasal pillow candidate secondary to being a mouth breather.    Allergies  Allergen Reactions  . Lopressor [Metoprolol Tartrate] Rash and Other (See Comments)    Bleeding (non-specific)   . Integrilin [Eptifibatide] Other (See Comments)   Bleeding (non-specific)      Outpatient Medications Prior to Visit  Medication Sig Dispense Refill  . amLODipine (NORVASC) 2.5 MG tablet Take 2.5 mg by mouth daily.    Marland Kitchen aspirin 81 MG tablet Take 81 mg by mouth daily.    Marland Kitchen atenolol (TENORMIN) 50 MG tablet Take 50 mg by mouth daily.  3  . clopidogrel (PLAVIX) 75 MG tablet Take 75 mg by mouth daily.  2  . emtricitabine-rilpivir-tenofovir AF (ODEFSEY) 200-25-25 MG TABS tablet Take 1 tablet by mouth daily with breakfast. 30 tablet 11  . fluticasone furoate-vilanterol (BREO ELLIPTA) 200-25 MCG/INH AEPB Inhale 1 puff into the lungs daily.    Marland Kitchen losartan-hydrochlorothiazide (HYZAAR) 100-25 MG tablet Take 1 tablet by mouth daily.   3  . Multiple Vitamin (MULTIVITAMIN) tablet Take 1 tablet by mouth daily.    Marland Kitchen omega-3 acid ethyl esters (LOVAZA) 1 g capsule Take 1 g by mouth daily.     . rosuvastatin (CRESTOR) 10 MG tablet Take 10 mg by mouth daily.   3  . sitaGLIPtin (JANUVIA) 100 MG tablet Take 100 mg by mouth daily.    Marland Kitchen SYNJARDY XR 12.07-998 MG TB24 TK 2 TS PO QD  3  . VASCEPA 1 g CAPS TK 2 CS PO BID  11  . SHINGRIX injection ADM 0.5ML IM UTD  0  . oxyCODONE-acetaminophen (PERCOCET) 10-325 MG tablet Take 1 tablet by mouth 4 (four) times daily as needed  for pain.  0  . traMADol (ULTRAM) 50 MG tablet Take 1 tablet (50 mg total) by mouth every 8 (eight) hours as needed. (Patient not taking: Reported on 10/25/2017) 30 tablet 0   No facility-administered medications prior to visit.      Past Medical History:  Diagnosis Date  . Abnormal result of cardiovascular function study, unspecified   . Atherosclerotic heart disease of native coronary artery without angina pectoris   . CAD (coronary artery disease)   . Coronary angioplasty status   . Diabetes mellitus without complication ([place])   . Essential (primary) hypertension   . Hepatitis C   . Hepatitis C   . HIV infection ([place])   . Hyperlipidemia   . Hypertension   . Mixed hyperlipidemia   .  Palpitations   . Shortness of breath   . Sleep apnea      Past Surgical History:  Procedure Laterality Date  . CARDIAC CATHETERIZATION Left 04/2013  . chlecystectomy    . PERCUTANEOUS CORONARY STENT INTERVENTION (PCI-S)         Review of Systems  Constitutional: Negative for appetite change, chills, fatigue, fever and unexpected weight change.  Eyes: Negative for visual disturbance.  Respiratory: Negative for cough, chest tightness, shortness of breath and wheezing.   Cardiovascular: Negative for chest pain and leg swelling.  Gastrointestinal: Negative for abdominal pain, constipation, diarrhea, nausea and vomiting.  Genitourinary: Negative for dysuria, flank pain, frequency, genital sores, hematuria and urgency.  Skin: Negative for rash.  Allergic/Immunologic: Negative for immunocompromised state.  Neurological: Negative for dizziness and headaches.      Objective:    BP (!) 150/84 (BP Location: Left Arm, Patient Position: Sitting, Cuff Size: Normal)   Pulse 61   Temp 98 F (36.7 C)   Wt 219 lb (99.3 kg)   BMI 28.89 kg/m  Nursing note and vital signs reviewed.  Physical Exam  Constitutional: He is oriented to person, place, and time. He appears well-developed and well-nourished. No distress.  HENT:  Right Ear: Hearing, tympanic membrane, external ear and ear canal normal.  Left Ear: Hearing, tympanic membrane, external ear and ear canal normal.  Cardiovascular: Normal rate, regular rhythm, normal heart sounds and intact distal pulses. Exam reveals no gallop and no friction rub.  No murmur heard. Pulmonary/Chest: Effort normal and breath sounds normal. No stridor. No respiratory distress. He has no wheezes. He has no rales. He exhibits no tenderness.  Neurological: He is alert and oriented to person, place, and time.  Skin: Skin is warm and dry.  Psychiatric: He has a normal mood and affect. His behavior is normal. Judgment and thought content normal.         Assessment & Plan:   Problem List Items Addressed This Visit      Cardiovascular and Mediastinum   Essential hypertension    Blood pressure elevated today with previous blood pressures within the recommended range. Continue to take medications as prescribed and monitor blood pressure at home. If average remains elevated recommend follow up with PCP.         Respiratory   Obstructive sleep apnea    Tyler Hendricks has poorly controlled sleep apnea as he does not currently wear his CPAP at night. He has noted increased levels of drowsiness in the afternoon and even at the dinner table. He declines referral to sleep medicine presently. Encouraged him to wear his CPAP at night as this is an additional risk factor for coronary artery disease as well  as heart failure.         Endocrine   Type 2 diabetes mellitus with hyperglycemia, without long-term current use of insulin (HCC)    Well controlled A1c of 6.0. Continue current medications and follow up with PCP.         Other   HIV disease ([place]) - Primary    Tyler Hendricks has well controlled HIV disease with his current medication regimen with a viral load that is undetectable and CD4 count of 440. He is adherent with his current medication regimen with no adverse side effects. He has no symptoms/signs of opportunistic infection/progressive HIV disease through history or physical exam. He does remain at significantly increased risk for coronary artery disease and kidney disease with multiple risk factors including HIV, hypertension, and diabetes. Continue current dosage of Odefsey. Plan for follow up office visit in 6 months or sooner if needed with blood work 1-2 weeks prior to appointment. Recommend nurse visit in September to obtain influenza vaccination.       Relevant Orders   T-helper cell (CD4)- ([facility] clinic only)   CBC   HIV 1 RNA quant-no reflex-bld   Comprehensive metabolic panel   Lipid panel   RPR   Tinnitus    Tinnitus of unclear  origin with normal ear exam and no evidence of blockage by ceruman. Per patient request will refer to ENT for further hearing evaluation and treatment as needed.           I have discontinued [facility], oxyCODONE-acetaminophen, and traMADol. I am also having him maintain his emtricitabine-rilpivir-tenofovir AF, clopidogrel, rosuvastatin, losartan-hydrochlorothiazide, VASCEPA, SYNJARDY XR, aspirin, omega-3 acid ethyl esters, multivitamin, amLODipine, fluticasone furoate-vilanterol, sitaGLIPtin, and atenolol.   Follow-up: Return in about 6 months (around 04/27/2018), or if symptoms worsen or fail to improve.   [name], MSN, FNP-C Nurse Practitioner [facility] Office phone: [phone] Pager: [place] number: [phone]

## 2017-10-25 NOTE — Assessment & Plan Note (Signed)
Well controlled A1c of 6.0. Continue current medications and follow up with PCP.

## 2017-10-25 NOTE — Assessment & Plan Note (Signed)
Tinnitus of unclear origin with normal ear exam and no evidence of blockage by ceruman. Per patient request will refer to ENT for further hearing evaluation and treatment as needed.

## 2017-10-25 NOTE — Assessment & Plan Note (Signed)
Tyler Hendricks has well controlled HIV disease with his current medication regimen with a viral load that is undetectable and CD4 count of 440. He is adherent with his current medication regimen with no adverse side effects. He has no symptoms/signs of opportunistic infection/progressive HIV disease through history or physical exam. He does remain at significantly increased risk for coronary artery disease and kidney disease with multiple risk factors including HIV, hypertension, and diabetes. Continue current dosage of Odefsey. Plan for follow up office visit in 6 months or sooner if needed with blood work 1-2 weeks prior to appointment. Recommend nurse visit in September to obtain influenza vaccination.

## 2017-10-25 NOTE — Assessment & Plan Note (Signed)
Blood pressure elevated today with previous blood pressures within the recommended range. Continue to take medications as prescribed and monitor blood pressure at home. If average remains elevated recommend follow up with PCP.

## 2017-11-03 DIAGNOSIS — H9311 Tinnitus, right ear: Secondary | ICD-10-CM | POA: Diagnosis not present

## 2017-11-03 DIAGNOSIS — H903 Sensorineural hearing loss, bilateral: Secondary | ICD-10-CM | POA: Diagnosis not present

## 2017-11-03 DIAGNOSIS — H90A22 Sensorineural hearing loss, unilateral, left ear, with restricted hearing on the contralateral side: Secondary | ICD-10-CM | POA: Diagnosis not present

## 2017-11-03 DIAGNOSIS — H90A31 Mixed conductive and sensorineural hearing loss, unilateral, right ear with restricted hearing on the contralateral side: Secondary | ICD-10-CM | POA: Diagnosis not present

## 2017-11-09 MED FILL — ODEFSEY 200-25-25 MG TABS: 200-25-25 | 30 days supply | Qty: 30 | Fill #6

## 2017-11-16 ENCOUNTER — Telehealth: Payer: Self-pay | Admitting: Nurse Practitioner

## 2017-11-16 NOTE — Telephone Encounter (Signed)
-----   Message from Marrian Salvage, [place] sent at 11/16/2017 10:02 AM EDT ----- Yes- okay to cancel referral; will you send back to me so we can turn this into an encounter to document that he is opting against treatment as directed? Thanks- ----- Message ----- From: Cora Daniels D Sent: 11/12/2017  10:14 AM EDT To: Marrian Salvage, FNP  You put in a referral for pt for liver specialist for his cirrhosis. I called [facility] and they spoke to him on 8/5 and he stated he didn't know why this referral was placed since he already knew he had cirrhosis and said he would contact us about it and call them back if he was going to schedule. He has not called [place] about it yet.  Can I cancel referral for now?  Please advise

## 2017-11-16 NOTE — Telephone Encounter (Signed)
See note below-- patient is declining referral to liver specialist at this time.

## 2017-12-03 ENCOUNTER — Telehealth: Payer: Self-pay | Admitting: Nurse Practitioner

## 2017-12-03 NOTE — Telephone Encounter (Signed)
Copied from [place] [id number]. Topic: Quick Communication - Rx Refill/Question >> Dec 03, 2017  2:28 PM Synthia Innocent wrote: Medication: traMADol (ULTRAM) 50 MG tablet   Has the patient contacted their pharmacy? Yes.   (Agent: If no, request that the patient contact the pharmacy for the refill.) (Agent: If yes, when and what did the pharmacy advise?)  Preferred Pharmacy (with phone number or street name): [facility] on [address]: Please be advised that RX refills may take up to 3 business days. We ask that you follow-up with your pharmacy.

## 2017-12-03 NOTE — Telephone Encounter (Signed)
Tramadol refill. Not on current medication list Last Refill:09/22/17 #30 Last OV: 08/25/17 PCP: Pricilla Holm Pharmacy:[facility] on [address]

## 2017-12-06 NOTE — Telephone Encounter (Signed)
Pt informed of below.  

## 2017-12-06 NOTE — Telephone Encounter (Signed)
It looks like he spoke with [facility] triage and stated Tramadol is for knee pain. He states he has been taking it for 2 months.   I checked [place] Controlled database and it looks like he has been prescribed this for more than 2 mos and also has Oxy/APAP 10/325 mg. I printed report for your review.  Please advise.

## 2017-12-06 NOTE — Telephone Encounter (Signed)
Per records, it looks like he was sent 180 oxy/APAP  on 11/15/17 by another provider for his knee pain. No tramadol refill sent.

## 2017-12-07 ENCOUNTER — Other Ambulatory Visit: Payer: Self-pay | Admitting: Nurse Practitioner

## 2017-12-08 MED FILL — ODEFSEY 200-25-25 MG TABS: 200-25-25 | 30 days supply | Qty: 30 | Fill #7

## 2017-12-15 NOTE — Progress Notes (Signed)
Cardiology Office Note   Date:  12/16/2017   ID:  Tyler Hendricks, DOB 05/19/1950, MRN [id number]  PCP:  Lance Sell, NP    No chief complaint on file.  CAD  Wt Readings from Last 3 Encounters:  12/16/17 258 lb 3.2 oz (117.1 kg)  10/25/17 219 lb (99.3 kg)  08/25/17 211 lb (95.7 kg)       History of Present Illness: Tyler Hendricks is a 67 y.o. male  Who multivessel stents placed in 2000 while in [place].  He has  Ah/o HIV as well, since 1989.  He is compliant with meds.  Viral load is nondetectable.  OM1 3.0 x 23 mm, left posterolateral 2.5 x 13 mm, diagonal 1 2.5 x 13, ostial diagonal 2.5 mm, proximal LAD- unknown size, mid LAD 3.0 x 13  - all BMS.    Last cath was 2005.  All stents were patent and there was moderate disease in the right PDA and right PLA.  In 2010, he had a monitor showing NSR with PVCs.   Since the last visit, he notes leg cramps.  Denies : Chest pain. Dizziness. Leg edema. Nitroglycerin use. Orthopnea. Palpitations. Paroxysmal nocturnal dyspnea. Shortness of breath. Syncope.   No bleeding problems.   Past Medical History:  Diagnosis Date  . Abnormal result of cardiovascular function study, unspecified   . Atherosclerotic heart disease of native coronary artery without angina pectoris   . CAD (coronary artery disease)   . Coronary angioplasty status   . Diabetes mellitus without complication ([place])   . Essential (primary) hypertension   . Hepatitis C   . Hepatitis C   . HIV infection ([place])   . Hyperlipidemia   . Hypertension   . Mixed hyperlipidemia   . Palpitations   . Shortness of breath   . Sleep apnea     Past Surgical History:  Procedure Laterality Date  . CARDIAC CATHETERIZATION Left 04/2013  . chlecystectomy    . PERCUTANEOUS CORONARY STENT INTERVENTION (PCI-S)       Current Outpatient Medications  Medication Sig Dispense Refill  . amLODipine (NORVASC) 2.5 MG tablet Take 2.5 mg by mouth daily.    Marland Kitchen aspirin 81 MG tablet  Take 81 mg by mouth daily.    Marland Kitchen atenolol (TENORMIN) 50 MG tablet Take 50 mg by mouth daily.  3  . clopidogrel (PLAVIX) 75 MG tablet TAKE 1 TABLET BY MOUTH DAILY 90 tablet 0  . emtricitabine-rilpivir-tenofovir AF (ODEFSEY) 200-25-25 MG TABS tablet Take 1 tablet by mouth daily with breakfast. 30 tablet 11  . losartan-hydrochlorothiazide (HYZAAR) 100-25 MG tablet Take 1 tablet by mouth daily.   3  . Multiple Vitamin (MULTIVITAMIN) tablet Take 1 tablet by mouth daily.    Marland Kitchen omega-3 acid ethyl esters (LOVAZA) 1 g capsule Take 1 g by mouth daily.     . rosuvastatin (CRESTOR) 10 MG tablet Take 10 mg by mouth daily.   3  . sitaGLIPtin (JANUVIA) 100 MG tablet Take 100 mg by mouth daily.    Marland Kitchen SYNJARDY XR 12.07-998 MG TB24 TK 2 TS PO QD  3  . VASCEPA 1 g CAPS TK 2 CS PO BID  11   No current facility-administered medications for this visit.     Allergies:   Lopressor [metoprolol tartrate] and Integrilin [eptifibatide]    Social History:  The patient  reports that he has never smoked. He has never used smokeless tobacco. He reports that he drinks alcohol. He reports that he  does not use drugs.   Family History:  The patient's family history includes Emphysema in his mother; Heart attack (age of onset: 95) in his father.    ROS:  Please see the history of present illness.   Otherwise, review of systems are positive for leg cramps.   All other systems are reviewed and negative.    PHYSICAL EXAM: VS:  BP 108/62   Pulse 62   Ht 6\' 1"  (1.854 m)   Wt 258 lb 3.2 oz (117.1 kg)   SpO2 95%   BMI 34.07 kg/m  , BMI Body mass index is 34.07 kg/m. GEN: Well nourished, well developed, in no acute distress  HEENT: normal  Neck: no JVD, carotid bruits, or masses Cardiac: RRR; 3/6 systolic murmurs; no rubs, or gallops,no edema  Respiratory:  clear to auscultation bilaterally, normal work of breathing GI: soft, nontender, nondistended, + BS MS: no deformity or atrophy  Skin: warm and dry, no rash Neuro:   Strength and sensation are intact Psych: euthymic mood, full affect   EKG:   The ekg ordered in 4/19 demonstrates NSR, RBBB   Recent Labs: 04/02/2017: Hemoglobin 15.4; Platelets 105 08/25/2017: ALT 32; BUN 12; Creatinine, Ser 0.68; Magnesium 1.9; Potassium 3.9; Sodium 140   Lipid Panel    Component Value Date/Time   CHOL 126 04/02/2017 1058   TRIG 47 04/02/2017 1058   HDL 90 04/02/2017 1058   CHOLHDL 1.4 04/02/2017 1058   LDLCALC 23 04/02/2017 1058     Other studies Reviewed: Additional studies/ records that were reviewed today with results demonstrating: .   ASSESSMENT AND PLAN:  1. CAD:  Medical management of branch vessel disease of late.  Noi angona.  Continue aggressive secondary prevention.  2. HTN: The current medical regimen is effective;  continue present plan and medications. 3. HIV: [facility]. 4. DM: A1C 6.0.  The current medical regimen is effective;  continue present plan and medications. 5. Aortic stenosis: moderate in 8/18.   Current medicines are reviewed at length with the patient today.  The patient concerns regarding his medicines were addressed.  The following changes have been made:  No change  Labs/ tests ordered today include:  No orders of the defined types were placed in this encounter.   Recommend 150 minutes/week of aerobic exercise Low fat, low carb, high fiber diet recommended  Disposition:   FU in 1 year   Signed, Larae Grooms, MD  12/16/2017 10:28 AM    [facility] [address], [place], [place]  [address] Phone: [phone]; Fax: [phone]

## 2017-12-16 ENCOUNTER — Ambulatory Visit (INDEPENDENT_AMBULATORY_CARE_PROVIDER_SITE_OTHER): Payer: Medicare Other | Admitting: Interventional Cardiology

## 2017-12-16 ENCOUNTER — Encounter: Payer: Self-pay | Admitting: Interventional Cardiology

## 2017-12-16 VITALS — BP 108/62 | HR 62 | Ht 73.0 in | Wt 258.2 lb

## 2017-12-16 DIAGNOSIS — I10 Essential (primary) hypertension: Secondary | ICD-10-CM | POA: Diagnosis not present

## 2017-12-16 DIAGNOSIS — I350 Nonrheumatic aortic (valve) stenosis: Secondary | ICD-10-CM | POA: Diagnosis not present

## 2017-12-16 DIAGNOSIS — E1165 Type 2 diabetes mellitus with hyperglycemia: Secondary | ICD-10-CM

## 2017-12-16 DIAGNOSIS — B20 Human immunodeficiency virus [HIV] disease: Secondary | ICD-10-CM

## 2017-12-16 DIAGNOSIS — I25118 Atherosclerotic heart disease of native coronary artery with other forms of angina pectoris: Secondary | ICD-10-CM

## 2017-12-16 NOTE — Patient Instructions (Signed)

## 2017-12-20 DIAGNOSIS — Z23 Encounter for immunization: Secondary | ICD-10-CM | POA: Diagnosis not present

## 2017-12-30 ENCOUNTER — Telehealth: Payer: Self-pay | Admitting: Interventional Cardiology

## 2017-12-30 NOTE — Telephone Encounter (Signed)
Returned call to patient. Patient states that he received a letter from his pharmacy stating that there was a recall on his losartan. Patient is taking losartan-hctz 100-25 mg QD. Called and spoke to [place] at [facility] on [address]. He states that their supply was not affected by that particular lot number.   Called the patient back and left a detailed message on his VM (DPR on file) letting the patient know that his losartan-hctz was not affected by the recall. Instructed the patient to continue taking his meds as he has been and to call back with any questions.

## 2017-12-30 NOTE — Telephone Encounter (Signed)
New Message          Patient call today stating there is a recall on  "Losartin" Lot # [id number]. Pls call to advise

## 2018-01-10 MED FILL — ODEFSEY 200-25-25 MG TABS: 200-25-25 | 30 days supply | Qty: 30 | Fill #8

## 2018-02-01 ENCOUNTER — Encounter: Payer: Self-pay | Admitting: Nurse Practitioner

## 2018-02-01 NOTE — Telephone Encounter (Signed)
Documented flu shot.Marland KitchenJohny Hendricks

## 2018-02-07 MED FILL — ODEFSEY 200-25-25 MG TABS: 200-25-25 | 30 days supply | Qty: 30 | Fill #9

## 2018-02-25 ENCOUNTER — Ambulatory Visit (INDEPENDENT_AMBULATORY_CARE_PROVIDER_SITE_OTHER): Payer: Medicare Other | Admitting: Nurse Practitioner

## 2018-02-25 ENCOUNTER — Encounter: Payer: Self-pay | Admitting: Nurse Practitioner

## 2018-02-25 ENCOUNTER — Other Ambulatory Visit (INDEPENDENT_AMBULATORY_CARE_PROVIDER_SITE_OTHER): Payer: Medicare Other

## 2018-02-25 VITALS — BP 134/80 | HR 67 | Ht 73.0 in | Wt 218.0 lb

## 2018-02-25 DIAGNOSIS — E1165 Type 2 diabetes mellitus with hyperglycemia: Secondary | ICD-10-CM

## 2018-02-25 DIAGNOSIS — E559 Vitamin D deficiency, unspecified: Secondary | ICD-10-CM

## 2018-02-25 DIAGNOSIS — Z125 Encounter for screening for malignant neoplasm of prostate: Secondary | ICD-10-CM

## 2018-02-25 DIAGNOSIS — L989 Disorder of the skin and subcutaneous tissue, unspecified: Secondary | ICD-10-CM

## 2018-02-25 DIAGNOSIS — I2510 Atherosclerotic heart disease of native coronary artery without angina pectoris: Secondary | ICD-10-CM

## 2018-02-25 DIAGNOSIS — L609 Nail disorder, unspecified: Secondary | ICD-10-CM | POA: Diagnosis not present

## 2018-02-25 DIAGNOSIS — Z1211 Encounter for screening for malignant neoplasm of colon: Secondary | ICD-10-CM | POA: Diagnosis not present

## 2018-02-25 DIAGNOSIS — I25118 Atherosclerotic heart disease of native coronary artery with other forms of angina pectoris: Secondary | ICD-10-CM | POA: Insufficient documentation

## 2018-02-25 LAB — HEMOGLOBIN A1C: Hgb A1c MFr Bld: 6.2 % (ref 4.6–6.5)

## 2018-02-25 LAB — CBC
HCT: 46.9 % (ref 39.0–52.0)
Hemoglobin: 16.5 g/dL (ref 13.0–17.0)
MCHC: 35.1 g/dL (ref 30.0–36.0)
MCV: 94.7 fl (ref 78.0–100.0)
Platelets: 110 10*3/uL — ABNORMAL LOW (ref 150.0–400.0)
RBC: 4.96 Mil/uL (ref 4.22–5.81)
RDW: 14 % (ref 11.5–15.5)
WBC: 4.7 10*3/uL (ref 4.0–10.5)

## 2018-02-25 LAB — PSA, MEDICARE: PSA: 0.12 ng/ml (ref 0.10–4.00)

## 2018-02-25 LAB — VITAMIN D 25 HYDROXY (VIT D DEFICIENCY, FRACTURES): VITD: 38.17 ng/mL (ref 30.00–100.00)

## 2018-02-25 NOTE — Progress Notes (Addendum)
Tyler Hendricks is a 67 y.o. male with the following history as recorded in [employer]:  Patient Active Problem List   Diagnosis Date Noted  . Obstructive sleep apnea 10/25/2017  . Tinnitus 10/25/2017  . Chronic pain of both knees 08/25/2017  . Extrinsic asthma without complication 22/29/7989  . Vitamin D deficiency 08/25/2017  . HIV disease ([place]) 04/15/2017  . Primary osteoarthritis of both knees 04/15/2017  . Type 2 diabetes mellitus with hyperglycemia, without long-term current use of insulin ([place]) 04/15/2017  . Essential hypertension 04/15/2017    Current Outpatient Medications  Medication Sig Dispense Refill  . amLODipine (NORVASC) 2.5 MG tablet Take 2.5 mg by mouth daily.    Marland Kitchen aspirin 81 MG tablet Take 81 mg by mouth daily.    Marland Kitchen atenolol (TENORMIN) 50 MG tablet Take 50 mg by mouth daily.  3  . clopidogrel (PLAVIX) 75 MG tablet TAKE 1 TABLET BY MOUTH DAILY 90 tablet 0  . emtricitabine-rilpivir-tenofovir AF (ODEFSEY) 200-25-25 MG TABS tablet Take 1 tablet by mouth daily with breakfast. 30 tablet 11  . losartan-hydrochlorothiazide (HYZAAR) 100-25 MG tablet Take 1 tablet by mouth daily.   3  . Multiple Vitamin (MULTIVITAMIN) tablet Take 1 tablet by mouth daily.    Marland Kitchen omega-3 acid ethyl esters (LOVAZA) 1 g capsule Take 1 g by mouth daily.     Marland Kitchen oxyCODONE-acetaminophen (PERCOCET) 10-325 MG tablet   0  . rosuvastatin (CRESTOR) 10 MG tablet Take 10 mg by mouth daily.   3  . sitaGLIPtin (JANUVIA) 100 MG tablet Take 100 mg by mouth daily.    Marland Kitchen SYNJARDY XR 12.07-998 MG TB24 TK 2 TS PO QD  3  . VASCEPA 1 g CAPS TK 2 CS PO BID  11   No current facility-administered medications for this visit.     Allergies: Lopressor [metoprolol tartrate] and Integrilin [eptifibatide]  Past Medical History:  Diagnosis Date  . Abnormal result of cardiovascular function study, unspecified   . Atherosclerotic heart disease of native coronary artery without angina pectoris   . CAD (coronary artery disease)   .  Coronary angioplasty status   . Diabetes mellitus without complication ([place])   . Essential (primary) hypertension   . Hepatitis C   . Hepatitis C   . HIV infection ([place])   . Hyperlipidemia   . Hypertension   . Mixed hyperlipidemia   . Palpitations   . Shortness of breath   . Sleep apnea     Past Surgical History:  Procedure Laterality Date  . CARDIAC CATHETERIZATION Left 04/2013  . chlecystectomy    . PERCUTANEOUS CORONARY STENT INTERVENTION (PCI-S)      Family History  Problem Relation Age of Onset  . Emphysema Mother        pulmonary  . Heart attack Father 19    Social History   Tobacco Use  . Smoking status: Never Smoker  . Smokeless tobacco: Never Used  Substance Use Topics  . Alcohol use: Yes    Frequency: Never    Comment: Occasional     Subjective:  Tyler Hendricks is here today for 6 month follow up of chronic conditions, health maintenance. He is also requesting referral to dermatology today for skin check and podiatry for routine foot care  Diabetes- maintained on  januvia 100 daily, synjardy XR 12.07-998 2 tabs daily. Per his medication list, He was actually taking 4000mg  metformin daily prior to 08/25/17 office visit, was instructed to decrease to 1000 BID and return in 3 months  for follow up to recheck A1c but he did not return until today, he tells me today he does not think he was ever on more than 2000 mg of metformin, he stopped his metformin when he started synjardy Reports daily medication compliance without adverse medication effects. Reports he does not routinely check his blood sugar readings at home. Denies tremor, diaphoresis, polyuria, polydipsia, polyphagia.  Lab Results  Component Value Date   HGBA1C 6.0 08/25/2017   Cad- maintained on plavix, takes daily as prescribed without noted adverse effects, no abnormal bruising or bleeding followed regularly by cardiology  Vitamin D deficiency- Switched to vitamin D 1000 IU daily after normal vitamin D  level in 08/25/17, he says he has been taking vitamin D supplement daily  ROS- See HPI  Objective:  Vitals:   02/25/18 0905  BP: 134/80  Pulse: 67  SpO2: 96%  Weight: 218 lb (98.9 kg)  Height: 6\' 1"  (1.854 m)    General: Well developed, well nourished, in no acute distress  Skin : Warm and dry.  Head: Normocephalic and atraumatic  Eyes: Sclera and conjunctiva clear; pupils round and reactive to light; extraocular movements intact  Oropharynx: Pink, supple. No suspicious lesions  Neck: Supple Lungs: Respirations unlabored; clear to auscultation bilaterally without wheeze, rales, rhonchi CVS exam: normal rate and regular rhythm, S1 and S2 normal. Murmur heard Extremities: No edema, cyanosis, clubbing  Vessels: Symmetric bilaterally  Neurologic: Alert and oriented; speech intact; face symmetrical; moves all extremities well; CNII-XII intact without focal deficit  Psychiatric: Normal mood and affect.  Assessment:  1. Type 2 diabetes mellitus with hyperglycemia, without long-term current use of insulin ([place])   2. Vitamin D deficiency   3. Screening for colon cancer   4. Screening for prostate cancer   5. Coronary artery disease without angina pectoris, unspecified vessel or lesion type, unspecified whether native or transplanted heart   6. Skin problem     Plan:   Return in about 6 months (around 08/27/2018) for routine follow up.  Orders Placed This Encounter  Procedures  . CBC    Standing Status:   Future    Standing Expiration Date:   02/26/2019  . VITAMIN D 25 Hydroxy (Vit-D Deficiency, Fractures)    Standing Status:   Future    Standing Expiration Date:   02/25/2019  . Hemoglobin A1c    Standing Status:   Future    Standing Expiration Date:   02/26/2019  . PSA, Medicare    Standing Status:   Future    Standing Expiration Date:   02/26/2019  . Ambulatory referral to Gastroenterology    Referral Priority:   Routine    Referral Type:   Consultation    Referral Reason:    Specialty Services Required    Number of Visits Requested:   1  . Ambulatory referral to Dermatology    Referral Priority:   Routine    Referral Type:   Consultation    Referral Reason:   Specialty Services Required    Requested Specialty:   Dermatology    Number of Visits Requested:   1    Requested Prescriptions    No prescriptions requested or ordered in this encounter    Reviewed health maintenance: Lipid panel up to date Screening for colon cancer- Ambulatory referral to Gastroenterology Screening for prostate cancer- PSA, Medicare; Future  Skin problem - Ambulatory referral to Dermatology  Nail problem - Ambulatory referral to Podiatry

## 2018-02-25 NOTE — Assessment & Plan Note (Signed)
Continue vitamin D supplement Update labs - VITAMIN D 25 Hydroxy (Vit-D Deficiency, Fractures); Future 

## 2018-02-25 NOTE — Patient Instructions (Signed)
Head downstairs for lab work  I will see you back in 6 months! Happy holidays!

## 2018-02-25 NOTE — Addendum Note (Signed)
Addended by: Lance Sell on: 02/25/2018 12:01 PM   Modules accepted: Orders

## 2018-02-25 NOTE — Assessment & Plan Note (Signed)
Continue plavix Continue regular follow up with cardiology  - CBC; Future

## 2018-02-25 NOTE — Assessment & Plan Note (Signed)
Continue current medications Update A1c - Hemoglobin A1c; Future

## 2018-03-05 ENCOUNTER — Other Ambulatory Visit: Payer: Self-pay | Admitting: Nurse Practitioner

## 2018-03-09 ENCOUNTER — Ambulatory Visit (INDEPENDENT_AMBULATORY_CARE_PROVIDER_SITE_OTHER): Payer: Medicare Other | Admitting: Podiatry

## 2018-03-09 ENCOUNTER — Encounter: Payer: Self-pay | Admitting: Podiatry

## 2018-03-09 VITALS — BP 136/74

## 2018-03-09 DIAGNOSIS — I25118 Atherosclerotic heart disease of native coronary artery with other forms of angina pectoris: Secondary | ICD-10-CM

## 2018-03-09 DIAGNOSIS — M79674 Pain in right toe(s): Secondary | ICD-10-CM

## 2018-03-09 DIAGNOSIS — M79675 Pain in left toe(s): Secondary | ICD-10-CM | POA: Diagnosis not present

## 2018-03-09 DIAGNOSIS — B351 Tinea unguium: Secondary | ICD-10-CM

## 2018-03-09 DIAGNOSIS — E1142 Type 2 diabetes mellitus with diabetic polyneuropathy: Secondary | ICD-10-CM

## 2018-03-09 NOTE — Patient Instructions (Signed)
Diabetes Mellitus and Foot Care  Foot care is an important part of your health, especially when you have diabetes. Diabetes may cause you to have problems because of poor blood flow (circulation) to your feet and legs, which can cause your skin to:   Become thinner and drier.   Break more easily.   Heal more slowly.   Peel and crack.  You may also have nerve damage (neuropathy) in your legs and feet, causing decreased feeling in them. This means that you may not notice minor injuries to your feet that could lead to more serious problems. Noticing and addressing any potential problems early is the best way to prevent future foot problems.  How to care for your feet  Foot hygiene   Wash your feet daily with warm water and mild soap. Do not use hot water. Then, pat your feet and the areas between your toes until they are completely dry. Do not soak your feet as this can dry your skin.   Trim your toenails straight across. Do not dig under them or around the cuticle. File the edges of your nails with an emery board or nail file.   Apply a moisturizing lotion or petroleum jelly to the skin on your feet and to dry, brittle toenails. Use lotion that does not contain alcohol and is unscented. Do not apply lotion between your toes.  Shoes and socks   Wear clean socks or stockings every day. Make sure they are not too tight. Do not wear knee-high stockings since they may decrease blood flow to your legs.   Wear shoes that fit properly and have enough cushioning. Always look in your shoes before you put them on to be sure there are no objects inside.   To break in new shoes, wear them for just a few hours a day. This prevents injuries on your feet.  Wounds, scrapes, corns, and calluses   Check your feet daily for blisters, cuts, bruises, sores, and redness. If you cannot see the bottom of your feet, use a mirror or ask someone for help.   Do not cut corns or calluses or try to remove them with medicine.   If you  find a minor scrape, cut, or break in the skin on your feet, keep it and the skin around it clean and dry. You may clean these areas with mild soap and water. Do not clean the area with peroxide, alcohol, or iodine.   If you have a wound, scrape, corn, or callus on your foot, look at it several times a day to make sure it is healing and not infected. Check for:  ? Redness, swelling, or pain.  ? Fluid or blood.  ? Warmth.  ? Pus or a bad smell.  General instructions   Do not cross your legs. This may decrease blood flow to your feet.   Do not use heating pads or hot water bottles on your feet. They may burn your skin. If you have lost feeling in your feet or legs, you may not know this is happening until it is too late.   Protect your feet from hot and cold by wearing shoes, such as at the beach or on hot pavement.   Schedule a complete foot exam at least once a year (annually) or more often if you have foot problems. If you have foot problems, report any cuts, sores, or bruises to your health care provider immediately.  Contact a health care provider if:     You have a medical condition that increases your risk of infection and you have any cuts, sores, or bruises on your feet.   You have an injury that is not healing.   You have redness on your legs or feet.   You feel burning or tingling in your legs or feet.   You have pain or cramps in your legs and feet.   Your legs or feet are numb.   Your feet always feel cold.   You have pain around a toenail.  Get help right away if:   You have a wound, scrape, corn, or callus on your foot and:  ? You have pain, swelling, or redness that gets worse.  ? You have fluid or blood coming from the wound, scrape, corn, or callus.  ? Your wound, scrape, corn, or callus feels warm to the touch.  ? You have pus or a bad smell coming from the wound, scrape, corn, or callus.  ? You have a fever.  ? You have a red line going up your leg.  Summary   Check your feet every day  for cuts, sores, red spots, swelling, and blisters.   Moisturize feet and legs daily.   Wear shoes that fit properly and have enough cushioning.   If you have foot problems, report any cuts, sores, or bruises to your health care provider immediately.   Schedule a complete foot exam at least once a year (annually) or more often if you have foot problems.  This information is not intended to replace advice given to you by your health care provider. Make sure you discuss any questions you have with your health care provider.  Document Released: 03/06/2000 Document Revised: 04/21/2017 Document Reviewed: 04/10/2016  Elsevier Interactive Patient Education  2019 [employer]

## 2018-03-09 NOTE — Progress Notes (Signed)
This patient presents the office stating that he has severe pain noted on the outside border big toenail right foot.  Patient states that he has been experiencing pain and discomfort for the last 4 to 6 weeks due to this nail problem.  Patient denies any history of trauma or injury to the toenail.  Patient states that he has been soaking his toe and resting his toe pain continues.  Patient is presently taking Plavix.  Patient also relates that he has neuropathy due to taking diabetic medicine previously.  He denies any pus or drainage from the right great toenail.  He presents the office today for an evaluation and treatment of his painful nail.  General Appearance  Alert, conversant and in no acute stress.  Vascular  Dorsalis pedis and posterior tibial  pulses are palpable  bilaterally.  Capillary return is within normal limits  bilaterally. Temperature is within normal limits  bilaterally.  Neurologic  Senn-Weinstein monofilament wire test diminished   bilaterally. Muscle power within normal limits bilaterally.  Nails Thick disfigured discolored nails with subungual debris   hallux toenails   bilaterally. No evidence of bacterial infection or drainage bilaterally. Marked incurvation both borders right hallux due to pincer toenail.  Orthopedic  No limitations of motion  feet .  No crepitus or effusions noted.  No bony pathology or digital deformities noted.  Skin  normotropic skin with no porokeratosis noted bilaterally.  No signs of infections or ulcers noted.    Onychomycosis  Hallux  B/L  IE.  Debride nails hallux  B/L.  Diabetic foot exam performed.  RTC 3 months.  Nail spicule removed lateral border right hallux.   Gardiner Barefoot DPM

## 2018-03-10 ENCOUNTER — Telehealth: Payer: Self-pay | Admitting: Gastroenterology

## 2018-03-10 MED FILL — ODEFSEY 200-25-25 MG TABS: 200-25-25 | 30 days supply | Qty: 30 | Fill #10

## 2018-03-10 NOTE — Telephone Encounter (Signed)
RECORDS RECEIVED FROM [facility] LAST COLONOSCOPY PLACED ON DOD 12/12 TO BE REVIEWED.

## 2018-03-25 ENCOUNTER — Encounter: Payer: Self-pay | Admitting: Gastroenterology

## 2018-03-25 NOTE — Telephone Encounter (Signed)
Called Tyler Hendricks to schedule Direct Colonoscopy. Tells me he takes a lot of medications and Plavix is one of them. He tells me he would rather meet and establish with his physician prior to scheduling Colonoscopy. Appointment sheduled for Friday 04-22-2018. New pt letter mailed.

## 2018-04-14 MED FILL — ODEFSEY 200-25-25 MG TABS: 200-25-25 | 30 days supply | Qty: 30 | Fill #11

## 2018-04-15 ENCOUNTER — Encounter: Payer: Self-pay | Admitting: *Deleted

## 2018-04-22 ENCOUNTER — Encounter: Payer: Self-pay | Admitting: Gastroenterology

## 2018-04-22 ENCOUNTER — Other Ambulatory Visit: Payer: Medicare Other

## 2018-04-22 ENCOUNTER — Telehealth: Payer: Self-pay

## 2018-04-22 ENCOUNTER — Ambulatory Visit (INDEPENDENT_AMBULATORY_CARE_PROVIDER_SITE_OTHER): Payer: Medicare Other | Admitting: Gastroenterology

## 2018-04-22 VITALS — BP 112/60 | HR 76 | Ht 71.0 in | Wt 214.0 lb

## 2018-04-22 DIAGNOSIS — K7400 Hepatic fibrosis, unspecified: Secondary | ICD-10-CM

## 2018-04-22 DIAGNOSIS — R194 Change in bowel habit: Secondary | ICD-10-CM | POA: Diagnosis not present

## 2018-04-22 DIAGNOSIS — Z86010 Personal history of colonic polyps: Secondary | ICD-10-CM | POA: Diagnosis not present

## 2018-04-22 DIAGNOSIS — K439 Ventral hernia without obstruction or gangrene: Secondary | ICD-10-CM

## 2018-04-22 DIAGNOSIS — D731 Hypersplenism: Secondary | ICD-10-CM

## 2018-04-22 DIAGNOSIS — D696 Thrombocytopenia, unspecified: Secondary | ICD-10-CM | POA: Diagnosis not present

## 2018-04-22 DIAGNOSIS — R768 Other specified abnormal immunological findings in serum: Secondary | ICD-10-CM

## 2018-04-22 NOTE — Telephone Encounter (Signed)
OK to hold plavix 5 days prior to procedure 

## 2018-04-22 NOTE — Telephone Encounter (Signed)
Request for surgical clearance:     Endoscopy Procedure  What type of surgery is being performed?     colonoscopy  When is this surgery scheduled?     05/26/2018  What type of clearance is required ?   Pharmacy  Are there any medications that need to be held prior to surgery and how long? Plavix 5 days   Practice name and name of physician performing surgery?      [facility]  What is your office phone and fax number?      Phone- [phone]  [phone]  Anesthesia type (None, local, MAC, general) ?       MAC

## 2018-04-22 NOTE — Patient Instructions (Addendum)
If you are age 68 or older, your body mass index should be between 23-30. Your Body mass index is 29.85 kg/m. If this is out of the aforementioned range listed, please consider follow up with your Primary Care Provider.  If you are age 53 or younger, your body mass index should be between 19-25. Your Body mass index is 29.85 kg/m. If this is out of the aformentioned range listed, please consider follow up with your Primary Care Provider.   Your provider has requested that you go to the basement level for lab work before leaving today. Press "B" on the elevator. The lab is located at the first door on the left as you exit the elevator.  You have been scheduled for a colonoscopy. Please follow written instructions given to you at your visit today.  Please pick up your prep supplies at the pharmacy within the next 1-3 days. If you use inhalers (even only as needed), please bring them with you on the day of your procedure.  Your physician has requested that you go to www.startemmi.com and enter the access code given to you at your visit today. This web site gives a general overview about your procedure. However, you should still follow specific instructions given to you by our office regarding your preparation for the procedure.  We will contact Dr. Miachel Roux regarding holding your Plavix for 5 days prior to colonoscopy.   Hold diabetic medication morning of procedure.   Thank you for choosing me and [facility].  Dr. Rush Landmark

## 2018-04-22 NOTE — Progress Notes (Signed)
[place] VISIT   Primary Care Provider Lance Sell, NP [place] [address] [phone]  Referring Provider Lance Sell, NP [address], [place] [address] [phone]  Patient Profile: Tyler Hendricks is a 68 y.o. male with a pmh significant for CAD (on Plavix), diabetes, hypertension, HIV, hyperlipidemia, hemorrhoids, colon polyps, sleep apnea, reported hepatitis C with negative PCR (found after visit and review of chart suggesting prior treatment).  The patient presents to the [facility] for an evaluation and management of problem(s) noted below:  Problem List 1. Hx of colonic polyp   2. Change in bowel habit   3. Ventral hernia without obstruction or gangrene   4. Liver fibrosis   5. Thrombocytopenia ([place])   6. Hypersplenism   7. Positive hepatitis C antibody test     History of Present Illness: This is the patient's first visit to the outpatient of our GI clinic.  The patient's referral was for discussion of outpatient colonoscopy.  The patient states that he has not really noticed any change in his bowel habits however his wife interjects and states that she has noticed a change in his bowel habits.  Use the restroom 2-3 times per day.  He had a 30 pound weight loss which has been mostly intentional over the course the last few months.  He has never had any hematochezia.  He denies any melena.  He is on Plavix but has not had a recent PCI intervention years.  He has infrequent abdominal discomfort in his mid abdomen which he has not clearly delineated what that is.  He has a family history significant for esophageal cancer in his brothers.  He denies any dysphagia or odynophagia.  He denies any heartburn or reflux.  The patient does not take any other significant nonsteroidals other than his aspirin.  GI Review of Systems Positive as above Negative for change in tenesmus or  rectal urgency  Review of Systems General: Denies fevers/chills HEENT: Denies oral lesions Cardiovascular: Denies chest pain Pulmonary: Denies shortness of breath Gastroenterological: See HPI Genitourinary: Denies darkened urine Hematological: Positive for easy bruising/bleeding due to Plavix per report Endocrine: Denies temperature intolerance Dermatological: Denies jaundice Psychological: Mood is stable   Medications Current Outpatient Medications  Medication Sig Dispense Refill  . amLODipine (NORVASC) 2.5 MG tablet Take 2.5 mg by mouth daily.    Marland Kitchen aspirin 81 MG tablet Take 81 mg by mouth daily.    Marland Kitchen atenolol (TENORMIN) 50 MG tablet Take 50 mg by mouth daily.  3  . clopidogrel (PLAVIX) 75 MG tablet TAKE 1 TABLET BY MOUTH DAILY 90 tablet 1  . emtricitabine-rilpivir-tenofovir AF (ODEFSEY) 200-25-25 MG TABS tablet Take 1 tablet by mouth daily with breakfast. 30 tablet 11  . losartan-hydrochlorothiazide (HYZAAR) 100-25 MG tablet Take 1 tablet by mouth daily.   3  . Multiple Vitamin (MULTIVITAMIN) tablet Take 1 tablet by mouth daily.    Marland Kitchen omega-3 acid ethyl esters (LOVAZA) 1 g capsule Take 1 g by mouth daily.     . rosuvastatin (CRESTOR) 10 MG tablet Take 10 mg by mouth daily.   3  . sitaGLIPtin (JANUVIA) 100 MG tablet Take 100 mg by mouth daily.    Marland Kitchen SYNJARDY XR 12.07-998 MG TB24 TK 2 TS PO QD  3  . VASCEPA 1 g CAPS TK 2 CS PO BID  11  . polyethylene glycol-electrolytes (NULYTELY/GOLYTELY) 420 g solution Take 4,000 mLs by mouth  once for 1 dose. For colonoscopy prep 4000 mL 0   No current facility-administered medications for this visit.     Allergies Allergies  Allergen Reactions  . Lopressor [Metoprolol Tartrate] Rash and Other (See Comments)    Bleeding (non-specific)   . Integrilin [Eptifibatide] Other (See Comments)    Bleeding (non-specific)    Histories Past Medical History:  Diagnosis Date  . Abnormal result of cardiovascular function study, unspecified   .  Atherosclerotic heart disease of native coronary artery without angina pectoris   . CAD (coronary artery disease)   . Coronary angioplasty status   . Diabetes mellitus without complication ([place])   . Essential (primary) hypertension   . Gallstones   . Hepatic cirrhosis ([place])   . Hepatitis C   . HIV infection ([place])   . Hyperlipidemia   . Hypertension   . Internal hemorrhoids   . Kidney stones   . Mixed hyperlipidemia   . Palpitations   . Shortness of breath   . Sleep apnea    CPAP  . Tubular adenoma of colon    Past Surgical History:  Procedure Laterality Date  . CARDIAC CATHETERIZATION Left 04/2013  . chlecystectomy    . PERCUTANEOUS CORONARY STENT INTERVENTION (PCI-S)    . UMBILICAL HERNIA REPAIR     Social History   Socioeconomic History  . Marital status: Married    Spouse name: Not on file  . Number of children: 1  . Years of education: Not on file  . Highest education level: 10th grade  Occupational History  . Occupation: retired  Scientific laboratory technician  . Financial resource strain: Not on file  . Food insecurity:    Worry: Never true    Inability: Never true  . Transportation needs:    Medical: No    Non-medical: No  Tobacco Use  . Smoking status: Former Smoker    Types: Cigarettes    Last attempt to quit: 1994    Years since quitting: 26.1  . Smokeless tobacco: Never Used  Substance and Sexual Activity  . Alcohol use: Yes    Frequency: Never    Comment: social  . Drug use: No  . Sexual activity: Yes  Lifestyle  . Physical activity:    Days per week: Not on file    Minutes per session: Not on file  . Stress: Not on file  Relationships  . Social connections:    Talks on phone: Not on file    Gets together: Not on file    Attends religious service: Not on file    Active member of club or organization: Not on file    Attends meetings of clubs or organizations: Not on file    Relationship status: Not on file  . Intimate partner violence:    Fear of  current or ex partner: No    Emotionally abused: No    Physically abused: No    Forced sexual activity: No  Other Topics Concern  . Not on file  Social History Narrative  . Not on file   Family History  Problem Relation Age of Onset  . Emphysema Mother        pulmonary  . Heart attack Father 34  . Lung cancer Sister   . Esophageal cancer Brother   . Esophageal cancer Brother   . Colon cancer Neg Hx   . Inflammatory bowel disease Neg Hx   . Liver disease Neg Hx   . Pancreatic cancer Neg Hx   .  Rectal cancer Neg Hx   . Stomach cancer Neg Hx   I have reviewed his medical, social, and family history in detail and updated the electronic medical record as necessary.    PHYSICAL EXAMINATION  BP 112/60 (BP Location: Left Arm, Patient Position: Sitting, Cuff Size: Normal)   Pulse 76   Ht 5\' 11"  (1.803 m) Comment: height measured without shoes  Wt 214 lb (97.1 kg)   BMI 29.85 kg/m  Wt Readings from Last 3 Encounters:  04/22/18 214 lb (97.1 kg)  02/25/18 218 lb (98.9 kg)  12/16/17 258 lb 3.2 oz (117.1 kg)  GEN: NAD, appears stated age, doesn't appear chronically ill, accompanied by wife PSYCH: Cooperative, without pressured speech EYE: Conjunctivae pink, sclerae anicteric ENT: MMM, without oral ulcers, no erythema or exudates noted NECK: Supple, enlarged neck girth CV: RR without R/Gs  RESP: CTAB posteriorly, without wheezing GI: NABS, soft, NT/ND, without rebound or guarding, no HSM appreciated MSK/EXT: Minimal 1/2+ pedal edema bilateral early SKIN: No jaundice, no spider angiomata NEURO:  Alert & Oriented x 3, no focal deficits   REVIEW OF DATA  I reviewed the following data at the time of this encounter:  GI Procedures and Studies  2015 Colonoscopy The perianal and digital rectal exams were normal.  Diffuse area of mildly erythematous mucosa was found in the ascending colon and in the cecum.Marland Kitchen  Biopsies were obtained.  The terminal ileum was normal.  Sessile polyp was  found in sigmoid colon that was 10 mm in size and removed with a hot snare.  Nonbleeding internal hemorrhoids were found during retroflexion were small.  Laboratory Studies  Reviewed in epic Suggestion of underlying thrombocytopenia  Imaging Studies  July 2019 ultrasound IMPRESSION: Status post cholecystectomy. Moderate splenomegaly. Findings consistent with hepatic cirrhosis. No focal lesion is noted.   ASSESSMENT  Mr. Wisham is a 68 y.o. male with a pmh significant for CAD (on Plavix), diabetes, hypertension, HIV, hyperlipidemia, hemorrhoids, colon polyps, sleep apnea, reported hepatitis C with negative PCR (found after visit and review of chart suggesting prior treatment).  The patient is seen today for evaluation and management of:  1. Hx of colonic polyp   2. Change in bowel habit   3. Ventral hernia without obstruction or gangrene   4. Liver fibrosis   5. Thrombocytopenia ([place])   6. Hypersplenism   7. Positive hepatitis C antibody test    This is a hemodynamically and clinically stable patient who was referred solely for colonoscopy and was almost a direct:.  However due to his medical complexity and multiple medications and Plavix use he was set up for a pre-clinic visit.  Not until after the completion of our visit today have I been able to fully review the patient's chart.  He actually has a history of hepatitis C and was treated outside of the area however we did not talk about this during the patient's visit and it did not come up from the patient or his wife.  They did not describe any history of liver disease in him and did not circle that.  His HIV diagnosis was in the late 80s.  He has been stable however.  However as I look at his ultrasound from last year it looks like he actually has echotexture that suggestive of cirrhosis as well as hyper spleen is him and he has thrombocytopenia.  Together this is very suggestive of a cirrhotic morphology.  We do not have an INR as I did  not plan  on obtaining that today.  We that being said I think a colonoscopy as we had initially planned is most reasonable for this patient will move forward with that.  However the patient will need to get additional lab work.  I also think that the patient should undergo an upper endoscopy as well and we will reach out to the patient and wife and describe the reasoning for that so that we can do a variceal screening.  I need to get some additional labs.  We will need to work on we will need to get approval for the patient to be off of Plavix for at least 5 days and potentially 2 to 3 days after the procedure depending on findings of polyps or not.  From a clinical standpoint when I examined him I did not see evidence of clinical decompensation.  However the patient will also need a abdominal ultrasound since it has been 6 months since his last one.  And this is for [facility] screening purposes.  We will discuss with the patient in further detail on future visits to ensure that we are following protocol for patients who may have underlying cirrhosis.  Based on the history that I do have now after reviewing things I suspect he has HCV related cirrhosis and most recently had negative viral load.  There has been a slight change in his bowel habits per the patient's wife's report we will obtain stool studies as well as rule out celiac disease and consider during the time of his colonoscopy obtaining biopsies to rule out microscopic/collagenous colitis.  The patient will also initiate fiber supplementation.  All patient questions were answered, to the best of my ability, and the patient agrees to the aforementioned plan of action with follow-up as indicated.   PLAN  Laboratories as outlined below Plan for colonoscopy and now we will add on endoscopy Will also set up for patient to have additional labs drawn to evaluate and see if he has gained immunity to hepatitis B if not the need for revaccination/reimmunization  protocol Fiber supplementation to begin once daily Additional work-up will be required including a liver ultrasound   Orders Placed This Encounter  Procedures  . Stool Culture  . Ova and parasite examination  . TSH  . C-reactive protein  . IgA  . Tissue transglutaminase, IgA  . Clostridium difficile Toxin B, Qualitative, Real-Time PCR  . Ambulatory referral to Gastroenterology    New Prescriptions   POLYETHYLENE GLYCOL-ELECTROLYTES (NULYTELY/GOLYTELY) 420 G SOLUTION    Take 4,000 mLs by mouth once for 1 dose. For colonoscopy prep   Modified Medications   No medications on file    Planned Follow Up: No follow-ups on file.   Justice Britain, MD [facility] Office # [phone]

## 2018-04-22 NOTE — Telephone Encounter (Signed)
   Primary Cardiologist: Larae Grooms, MD  Chart reviewed as part of pre-operative protocol coverage. Pt last seen by Dr. Irish Lack 12/16/17. I attempted to reach pt by phone and left a message to call back to complete preop assessment. If he is w/o angina, he can be cleared. In the meantime, I will route to Dr. Irish Lack to address Plavix hold.   Lyda Jester, PA-C 04/22/2018, 3:51 PM

## 2018-04-26 NOTE — Telephone Encounter (Signed)
   Primary Cardiologist: Larae Grooms, MD  Chart reviewed as part of pre-operative protocol coverage. Patient was contacted 04/26/2018 in reference to pre-operative risk assessment for pending surgery as outlined below.  Tyler Hendricks was last seen on 11/2017 by Dr. Irish Lack.  Since that day, Tyler Hendricks has done well. He denies any new symptoms and changes to his medical history. He can complete more than 4.0 METS.   Per Dr. Irish Lack, patient may hold plavix 5 days prior to procedure. Pt is aware.  Therefore, based on ACC/AHA guidelines, the patient would be at acceptable risk for the planned procedure without further cardiovascular testing.   I will route this recommendation to the requesting party via Epic fax function and remove from pre-op pool.  Please call with questions.  Tami Lin Duke, PA 04/26/2018, 11:15 AM

## 2018-04-26 NOTE — Telephone Encounter (Signed)
Pt has been informed to hold Plavix 5 days prior to procedure. Pt states that he understands.

## 2018-04-27 ENCOUNTER — Encounter: Payer: Self-pay | Admitting: Gastroenterology

## 2018-04-27 ENCOUNTER — Other Ambulatory Visit: Payer: Self-pay

## 2018-04-27 ENCOUNTER — Other Ambulatory Visit: Payer: Medicare Other

## 2018-04-27 DIAGNOSIS — D696 Thrombocytopenia, unspecified: Secondary | ICD-10-CM | POA: Insufficient documentation

## 2018-04-27 DIAGNOSIS — K439 Ventral hernia without obstruction or gangrene: Secondary | ICD-10-CM | POA: Insufficient documentation

## 2018-04-27 DIAGNOSIS — D731 Hypersplenism: Secondary | ICD-10-CM | POA: Insufficient documentation

## 2018-04-27 DIAGNOSIS — R7689 Other specified abnormal immunological findings in serum: Secondary | ICD-10-CM | POA: Insufficient documentation

## 2018-04-27 DIAGNOSIS — K7400 Hepatic fibrosis, unspecified: Secondary | ICD-10-CM | POA: Insufficient documentation

## 2018-04-27 DIAGNOSIS — Z860100 Personal history of colon polyps, unspecified: Secondary | ICD-10-CM | POA: Insufficient documentation

## 2018-04-27 DIAGNOSIS — R194 Change in bowel habit: Secondary | ICD-10-CM | POA: Insufficient documentation

## 2018-04-27 DIAGNOSIS — R768 Other specified abnormal immunological findings in serum: Secondary | ICD-10-CM | POA: Insufficient documentation

## 2018-04-27 DIAGNOSIS — B20 Human immunodeficiency virus [HIV] disease: Secondary | ICD-10-CM | POA: Diagnosis not present

## 2018-04-27 MED ORDER — PEG 3350-KCL-NA BICARB-NACL 420 G PO SOLR: 4000.0000 mL | Freq: Once | ORAL | 0 refills | Status: AC

## 2018-04-27 NOTE — Progress Notes (Signed)
After further review of the patient's chart on completion of his notes it looks like the patient likely has underlying cirrhosis. He is due for colonoscopy as we discussed in our consultation note last week.  However he should also have an upper endoscopy done for variceal screening. The patient also needs to have an abdominal ultrasound since his been 6 months since his last one. Need to obtain repeat labs as well: HCV RNA PCR, hepatitis B surface antibody I will relay this to my staff to reach out to the patient so that we can have the above noted and done for the patient in the coming weeks and for the EGD to be added onto colonoscopy.  Justice Britain, MD [facility] Office # [phone]

## 2018-04-28 ENCOUNTER — Encounter: Payer: Medicare Other | Admitting: Gastroenterology

## 2018-04-28 LAB — T-HELPER CELL (CD4) - (RCID CLINIC ONLY)
CD4 % Helper T Cell: 51 % (ref 33–55)
CD4 T Cell Abs: 670 /uL (ref 400–2700)

## 2018-04-28 MED FILL — PEG-3350 SOLUTION: 420 | 1 days supply | Qty: 4000 | Fill #0

## 2018-04-29 ENCOUNTER — Other Ambulatory Visit: Payer: Self-pay

## 2018-04-29 ENCOUNTER — Telehealth: Payer: Self-pay

## 2018-04-29 DIAGNOSIS — Z86010 Personal history of colonic polyps: Secondary | ICD-10-CM

## 2018-04-29 DIAGNOSIS — R194 Change in bowel habit: Secondary | ICD-10-CM

## 2018-04-29 DIAGNOSIS — K7460 Unspecified cirrhosis of liver: Secondary | ICD-10-CM

## 2018-04-29 LAB — COMPREHENSIVE METABOLIC PANEL
AG Ratio: 1.3 (calc) (ref 1.0–2.5)
ALT: 30 U/L (ref 9–46)
AST: 34 U/L (ref 10–35)
Albumin: 3.7 g/dL (ref 3.6–5.1)
Alkaline phosphatase (APISO): 105 U/L (ref 35–144)
BUN: 19 mg/dL (ref 7–25)
CO2: 28 mmol/L (ref 20–32)
Calcium: 9.4 mg/dL (ref 8.6–10.3)
Chloride: 109 mmol/L (ref 98–110)
Creat: 0.7 mg/dL (ref 0.70–1.25)
Globulin: 2.8 g/dL (calc) (ref 1.9–3.7)
Glucose, Bld: 137 mg/dL — ABNORMAL HIGH (ref 65–99)
Potassium: 4.5 mmol/L (ref 3.5–5.3)
Sodium: 146 mmol/L (ref 135–146)
Total Bilirubin: 2.5 mg/dL — ABNORMAL HIGH (ref 0.2–1.2)
Total Protein: 6.5 g/dL (ref 6.1–8.1)

## 2018-04-29 LAB — CBC
HEMATOCRIT: 47 % (ref 38.5–50.0)
Hemoglobin: 16.3 g/dL (ref 13.2–17.1)
MCH: 32.9 pg (ref 27.0–33.0)
MCHC: 34.7 g/dL (ref 32.0–36.0)
MCV: 94.9 fL (ref 80.0–100.0)
MPV: 10.4 fL (ref 7.5–12.5)
Platelets: 93 10*3/uL — ABNORMAL LOW (ref 140–400)
RBC: 4.95 10*6/uL (ref 4.20–5.80)
RDW: 13.1 % (ref 11.0–15.0)
WBC: 4.8 10*3/uL (ref 3.8–10.8)

## 2018-04-29 LAB — HIV-1 RNA QUANT-NO REFLEX-BLD
HIV 1 RNA Quant: <20 copies/mL
HIV-1 RNA Quant, Log: <1.3 Log copies/mL

## 2018-04-29 LAB — LIPID PANEL
Cholesterol: 147 mg/dL (ref <200)
HDL: 85 mg/dL (ref >=40)
LDL CHOLESTEROL (CALC): 48 mg/dL
Non-HDL Cholesterol (Calc): 62 mg/dL (calc) (ref <130)
Total CHOL/HDL Ratio: 1.7 (calc) (ref <5.0)
Triglycerides: 61 mg/dL (ref <150)

## 2018-04-29 LAB — RPR: RPR Ser Ql: NONREACTIVE

## 2018-04-29 NOTE — Progress Notes (Unsigned)
SW pt this afternoon, he agrees to have EGD added to Colon per our discussion. EGD has been added, he would like to wait for abd ultrasound and additional labs as requested until after procedures.

## 2018-04-29 NOTE — Telephone Encounter (Signed)
See additional note under orders only.

## 2018-04-29 NOTE — Progress Notes (Signed)
Thank you for the update. GM 

## 2018-05-09 ENCOUNTER — Other Ambulatory Visit: Payer: Self-pay | Admitting: Pharmacist

## 2018-05-09 ENCOUNTER — Telehealth: Payer: Self-pay

## 2018-05-09 DIAGNOSIS — B20 Human immunodeficiency virus [HIV] disease: Secondary | ICD-10-CM

## 2018-05-09 NOTE — Telephone Encounter (Signed)
Patient requesting refill on Odefsey.   [name], [place]

## 2018-05-11 ENCOUNTER — Encounter: Payer: Self-pay | Admitting: Family

## 2018-05-11 ENCOUNTER — Ambulatory Visit (INDEPENDENT_AMBULATORY_CARE_PROVIDER_SITE_OTHER): Payer: Medicare Other | Admitting: Family

## 2018-05-11 VITALS — BP 118/71 | HR 61 | Temp 98.0°F | Ht 71.0 in | Wt 222.8 lb

## 2018-05-11 DIAGNOSIS — G4733 Obstructive sleep apnea (adult) (pediatric): Secondary | ICD-10-CM

## 2018-05-11 DIAGNOSIS — Z0000 Encounter for general adult medical examination without abnormal findings: Secondary | ICD-10-CM | POA: Diagnosis not present

## 2018-05-11 DIAGNOSIS — E1165 Type 2 diabetes mellitus with hyperglycemia: Secondary | ICD-10-CM | POA: Diagnosis not present

## 2018-05-11 DIAGNOSIS — B20 Human immunodeficiency virus [HIV] disease: Secondary | ICD-10-CM | POA: Diagnosis not present

## 2018-05-11 NOTE — Progress Notes (Signed)
Subjective:    Patient ID: Tyler Hendricks, male    DOB: 1950/06/09, 68 y.o.   MRN: [id number]  Chief Complaint  Patient presents with  . HIV Positive/AIDS     HPI:  Tyler Hendricks is a 68 y.o. male who presents today for routine follow-up of HIV disease.  [name] was last seen in the office on 10/25/2017 with good adherence and tolerance to his ART regimen of Odefsey.  CD4 count was 440 with a viral load that remains undetectable.  He was also noted to have tinnitus and referred to ear nose and throat.  Most recent blood work completed on 04/27/2018 with a viral load that remains suppressed and undetectable with CD4 count of 670.  Glucose slightly elevated at 137 with normal kidney function, liver function, electrolytes.  LDL cholesterol of 48 with HDL cholesterol of 85.  [name] continues to take his Vernell Leep as prescribed with no missed doses or adverse side effects. He is feeling well overall. Wife reports that he is falling asleep a lot including when sitting and eating or talking. Denies fevers, chills, night sweats, headaches, changes in vision, neck pain/stiffness, nausea, diarrhea, vomiting, lesions or rashes.  Continues to be covered through [employer] and has no problems obtaining his medications from [facility]. Monogamous relationship with stable housing and no problems accessing food. Denies recreational or illicit drug use.    Allergies  Allergen Reactions  . Lopressor [Metoprolol Tartrate] Rash and Other (See Comments)    Bleeding (non-specific)   . Integrilin [Eptifibatide] Other (See Comments)    Bleeding (non-specific)      Outpatient Medications Prior to Visit  Medication Sig Dispense Refill  . amLODipine (NORVASC) 2.5 MG tablet Take 2.5 mg by mouth daily.    Marland Kitchen aspirin 81 MG tablet Take 81 mg by mouth daily.    Marland Kitchen atenolol (TENORMIN) 50 MG tablet Take 50 mg by mouth daily.  3  . clopidogrel (PLAVIX) 75 MG tablet TAKE 1 TABLET BY MOUTH  DAILY 90 tablet 1  . emtricitabine-rilpivir-tenofovir AF (ODEFSEY) 200-25-25 MG TABS tablet Take 1 tablet by mouth daily with breakfast. 30 tablet 11  . losartan-hydrochlorothiazide (HYZAAR) 100-25 MG tablet Take 1 tablet by mouth daily.   3  . Multiple Vitamin (MULTIVITAMIN) tablet Take 1 tablet by mouth daily.    Marland Kitchen omega-3 acid ethyl esters (LOVAZA) 1 g capsule Take 1 g by mouth daily.     . rosuvastatin (CRESTOR) 10 MG tablet Take 10 mg by mouth daily.   3  . sitaGLIPtin (JANUVIA) 100 MG tablet Take 100 mg by mouth daily.    Marland Kitchen SYNJARDY XR 12.07-998 MG TB24 TK 2 TS PO QD  3  . VASCEPA 1 g CAPS TK 2 CS PO BID  11   No facility-administered medications prior to visit.      Past Medical History:  Diagnosis Date  . Abnormal result of cardiovascular function study, unspecified   . Atherosclerotic heart disease of native coronary artery without angina pectoris   . CAD (coronary artery disease)   . Coronary angioplasty status   . Diabetes mellitus without complication ([place])   . Essential (primary) hypertension   . Gallstones   . Hepatic cirrhosis ([place])   . Hepatitis C   . HIV infection ([place])   . Hyperlipidemia   . Hypertension   . Internal hemorrhoids   . Kidney stones   . Mixed hyperlipidemia   . Palpitations   . Shortness of breath   .  Sleep apnea    CPAP  . Tubular adenoma of colon      Past Surgical History:  Procedure Laterality Date  . CARDIAC CATHETERIZATION Left 04/2013  . chlecystectomy    . PERCUTANEOUS CORONARY STENT INTERVENTION (PCI-S)    . UMBILICAL HERNIA REPAIR         Review of Systems  Constitutional: Negative for appetite change, chills, fatigue, fever and unexpected weight change.  Eyes: Negative for visual disturbance.  Respiratory: Negative for cough, chest tightness, shortness of breath and wheezing.   Cardiovascular: Negative for chest pain and leg swelling.  Gastrointestinal: Negative for abdominal pain, constipation, diarrhea, nausea and  vomiting.  Genitourinary: Negative for dysuria, flank pain, frequency, genital sores, hematuria and urgency.  Skin: Negative for rash.  Allergic/Immunologic: Negative for immunocompromised state.  Neurological: Negative for dizziness and headaches.      Objective:    BP 118/71   Pulse 61   Temp 98 F (36.7 C)   Ht 5\' 11"  (1.803 m)   Wt 222 lb 12.8 oz (101.1 kg)   BMI 31.07 kg/m  Nursing note and vital signs reviewed.  Physical Exam Constitutional:      General: He is not in acute distress.    Appearance: He is well-developed.  Eyes:     Conjunctiva/sclera: Conjunctivae normal.  Neck:     Musculoskeletal: Neck supple.  Cardiovascular:     Rate and Rhythm: Normal rate and regular rhythm.     Heart sounds: Normal heart sounds. No murmur. No friction rub. No gallop.   Pulmonary:     Effort: Pulmonary effort is normal. No respiratory distress.     Breath sounds: Normal breath sounds. No wheezing or rales.  Chest:     Chest wall: No tenderness.  Abdominal:     General: Bowel sounds are normal.     Palpations: Abdomen is soft.     Tenderness: There is no abdominal tenderness.  Lymphadenopathy:     Cervical: No cervical adenopathy.  Skin:    General: Skin is warm and dry.     Findings: No rash.  Neurological:     Mental Status: He is alert and oriented to person, place, and time.  Psychiatric:        Behavior: Behavior normal.        Thought Content: Thought content normal.        Judgment: Judgment normal.        Assessment & Plan:   Problem List Items Addressed This Visit      Respiratory   Obstructive sleep apnea    Previously diagnosed with sleep apnea and not currently using his CPAP machine as it is uncomfortable. Equipment is about 68 years old and has not had a sleep study recently. Will refer to sleep medicine for evaluation and treatment of sleep apnea and increased somnulence.       Relevant Orders   Ambulatory referral to Neurology     Endocrine    Type 2 diabetes mellitus with hyperglycemia, without long-term current use of insulin ([place])    Most recent A1c of 6.2 indicating adequate control of diabetes. Discussed importance of nutritional choices. Continue medications with changes as needed per primary team.         Other   HIV disease ([place]) - Primary    Mr. Kampa has well controlled HIV disease with good adherence and tolerance to his ART regimen of Odefsey. No signs/symptoms or opportunistic infection or progressive HIV disease at present. Continue  current dose of Odefsey. Plan for office follow up in 6 months or sooner if needed with lab work 1-2 weeks prior to appointment.       Relevant Orders   T-helper cell (CD4)- ([facility] clinic only)   HIV-1 RNA quant-no reflex-bld   CBC   Comprehensive metabolic panel   Lipid panel   RPR   Healthcare maintenance     Upcoming colon cancer screening  Due for dental exam with referral placed to [facility] dental clinic for routine dental care.   Monogamous relationship. Emphasized importance of safe sexual practice to reduce risk of transmission and acquisition of STI.           I am having Georges Lynch maintain his emtricitabine-rilpivir-tenofovir AF, rosuvastatin, losartan-hydrochlorothiazide, VASCEPA, SYNJARDY XR, aspirin, omega-3 acid ethyl esters, multivitamin, amLODipine, sitaGLIPtin, atenolol, and clopidogrel.   No orders of the defined types were placed in this encounter.    Follow-up: Return in about 6 months (around 11/09/2018), or if symptoms worsen or fail to improve.   [name], MSN, FNP-C Nurse Practitioner [facility] Office phone: [phone] Pager: [place] number: [phone]

## 2018-05-11 NOTE — Assessment & Plan Note (Signed)
Previously diagnosed with sleep apnea and not currently using his CPAP machine as it is uncomfortable. Equipment is about 68 years old and has not had a sleep study recently. Will refer to sleep medicine for evaluation and treatment of sleep apnea and increased somnulence.

## 2018-05-11 NOTE — Patient Instructions (Addendum)
Nice to see you.   Please continue to take your [facility] as prescribed daily.   We will get you scheduled to see the dentist here for oral care.  We will also refer you to sleep medicine for your daytime sleepiness and establish for sleep apnea.   Plan for follow up office visit in 6 months or sooner if needed with lab work 1-2 weeks prior to appointment.

## 2018-05-11 NOTE — Assessment & Plan Note (Signed)
Mr. Aldaco has well controlled HIV disease with good adherence and tolerance to his ART regimen of Odefsey. No signs/symptoms or opportunistic infection or progressive HIV disease at present. Continue current dose of Odefsey. Plan for office follow up in 6 months or sooner if needed with lab work 1-2 weeks prior to appointment.

## 2018-05-11 NOTE — Assessment & Plan Note (Signed)
   Upcoming colon cancer screening  Due for dental exam with referral placed to [facility] dental clinic for routine dental care.   Monogamous relationship. Emphasized importance of safe sexual practice to reduce risk of transmission and acquisition of STI.

## 2018-05-11 NOTE — Assessment & Plan Note (Signed)
Most recent A1c of 6.2 indicating adequate control of diabetes. Discussed importance of nutritional choices. Continue medications with changes as needed per primary team.

## 2018-05-18 MED FILL — ODEFSEY 200-25-25 MG TABS: 200-25-25 | 30 days supply | Qty: 30 | Fill #0

## 2018-05-23 ENCOUNTER — Other Ambulatory Visit: Payer: Self-pay

## 2018-05-23 ENCOUNTER — Ambulatory Visit: Payer: Medicare Other | Admitting: Family

## 2018-05-23 ENCOUNTER — Encounter: Payer: Self-pay | Admitting: Nurse Practitioner

## 2018-05-23 ENCOUNTER — Ambulatory Visit (INDEPENDENT_AMBULATORY_CARE_PROVIDER_SITE_OTHER): Payer: Medicare Other | Admitting: Nurse Practitioner

## 2018-05-23 VITALS — BP 138/80 | HR 88 | Temp 99.0°F | Ht 71.0 in | Wt 216.0 lb

## 2018-05-23 DIAGNOSIS — R6889 Other general symptoms and signs: Secondary | ICD-10-CM

## 2018-05-23 DIAGNOSIS — J111 Influenza due to unidentified influenza virus with other respiratory manifestations: Secondary | ICD-10-CM | POA: Diagnosis not present

## 2018-05-23 LAB — POC INFLUENZA A&B (BINAX/QUICKVUE)
INFLUENZA A, POC: POSITIVE — AB
Influenza B, POC: NEGATIVE

## 2018-05-23 MED ORDER — OSELTAMIVIR PHOSPHATE 75 MG PO CAPS: 75.0000 mg | ORAL_CAPSULE | Freq: Two times a day (BID) | ORAL | 0 refills | Status: DC

## 2018-05-23 MED ORDER — BENZONATATE 100 MG PO CAPS: 100.0000 mg | ORAL_CAPSULE | Freq: Two times a day (BID) | ORAL | 0 refills | Status: DC | PRN

## 2018-05-23 NOTE — Patient Instructions (Addendum)
For nasal and head congestion may take Sudafed PE 10 mg every 4 hour as needed. Saline nasal spray used frequently. For drainage may use Allegra, Claritin or Zyrtec. If you need stronger medicine to stop drainage may take Chlor-Trimeton 2-4 mg every 4 hours. This may cause drowsiness. Ibuprofen 600 mg every 6 hours as needed for pain, discomfort or fever. Drink plenty of fluids and stay well-hydrated.  Tessalon as needed for cough.  Please take tamiflu twice daily for 5 days as prescribed.  Please follow up for fevers over 101, if your symptoms get worse, or if your symptoms dont get better in 1 week.   Influenza, Adult Influenza is also called "the flu." It is an infection in the lungs, nose, and throat (respiratory tract). It is caused by a virus. The flu causes symptoms that are similar to symptoms of a cold. It also causes a high fever and body aches. The flu spreads easily from person to person (is contagious). Getting a flu shot (influenza vaccination) every year is the best way to prevent the flu. What are the causes? This condition is caused by the influenza virus. You can get the virus by:  Breathing in droplets that are in the air from the cough or sneeze of a person who has the virus.  Touching something that has the virus on it (is contaminated) and then touching your mouth, nose, or eyes. What increases the risk? Certain things may make you more likely to get the flu. These include:  Not washing your hands often.  Having close contact with many people during cold and flu season.  Touching your mouth, eyes, or nose without first washing your hands.  Not getting a flu shot every year. You may have a higher risk for the flu, along with serious problems such as a lung infection (pneumonia), if you:  Are older than 65.  Are pregnant.  Have a weakened disease-fighting system (immune system) because of a disease or taking certain medicines.  Have a long-term (chronic)  illness, such as: ? Heart, kidney, or lung disease. ? Diabetes. ? Asthma.  Have a liver disorder.  Are very overweight (morbidly obese).  Have anemia. This is a condition that affects your red blood cells. What are the signs or symptoms? Symptoms usually begin suddenly and last 4-14 days. They may include:  Fever and chills.  Headaches, body aches, or muscle aches.  Sore throat.  Cough.  Runny or stuffy (congested) nose.  Chest discomfort.  Not wanting to eat as much as normal (poor appetite).  Weakness or feeling tired (fatigue).  Dizziness.  Feeling sick to your stomach (nauseous) or throwing up (vomiting). How is this treated? If the flu is found early, you can be treated with medicine that can help reduce how bad the illness is and how long it lasts (antiviral medicine). This may be given by mouth (orally) or through an IV tube. Taking care of yourself at home can help your symptoms get better. Your doctor may suggest:  Taking over-the-counter medicines.  Drinking plenty of fluids. The flu often goes away on its own. If you have very bad symptoms or other problems, you may be treated in a hospital. Follow these instructions at home:     Activity  Rest as needed. Get plenty of sleep.  Stay home from work or school as told by your doctor. ? Do not leave home until you do not have a fever for 24 hours without taking medicine. ? Leave  home only to visit your doctor. Eating and drinking  Take an ORS (oral rehydration solution). This is a drink that is sold at pharmacies and stores.  Drink enough fluid to keep your pee (urine) pale yellow.  Drink clear fluids in small amounts as you are able. Clear fluids include: ? Water. ? Ice chips. ? Fruit juice that has water added (diluted fruit juice). ? Low-calorie sports drinks.  Eat bland, easy-to-digest foods in small amounts as you are able. These foods include: ? Bananas. ? Applesauce. ? Rice. ? Lean  meats. ? Toast. ? Crackers.  Do not eat or drink: ? Fluids that have a lot of sugar or caffeine. ? Alcohol. ? Spicy or fatty foods. General instructions  Take over-the-counter and prescription medicines only as told by your doctor.  Use a cool mist humidifier to add moisture to the air in your home. This can make it easier for you to breathe.  Cover your mouth and nose when you cough or sneeze.  Wash your hands with soap and water often, especially after you cough or sneeze. If you cannot use soap and water, use alcohol-based hand sanitizer.  Keep all follow-up visits as told by your doctor. This is important. How is this prevented?   Get a flu shot every year. You may get the flu shot in late summer, fall, or winter. Ask your doctor when you should get your flu shot.  Avoid contact with people who are sick during fall and winter (cold and flu season). Contact a doctor if:  You get new symptoms.  You have: ? Chest pain. ? Watery poop (diarrhea). ? A fever.  Your cough gets worse.  You start to have more mucus.  You feel sick to your stomach.  You throw up. Get help right away if you:  Have shortness of breath.  Have trouble breathing.  Have skin or nails that turn a bluish color.  Have very bad pain or stiffness in your neck.  Get a sudden headache.  Get sudden pain in your face or ear.  Cannot eat or drink without throwing up. Summary  Influenza ("the flu") is an infection in the lungs, nose, and throat. It is caused by a virus.  Take over-the-counter and prescription medicines only as told by your doctor.  Getting a flu shot every year is the best way to avoid getting the flu. This information is not intended to replace advice given to you by your health care provider. Make sure you discuss any questions you have with your health care provider. Document Released: 12/17/2007 Document Revised: 08/25/2017 Document Reviewed: 08/25/2017 Elsevier  Interactive Patient Education  2019 [employer].

## 2018-05-23 NOTE — Progress Notes (Signed)
Tyler Hendricks is a 68 y.o. male with the following history as recorded in EpicCare:  Patient Active Problem List   Diagnosis Date Noted  . Healthcare maintenance 05/11/2018  . Hx of colonic polyp 04/27/2018  . Change in bowel habit 04/27/2018  . Ventral hernia without obstruction or gangrene 04/27/2018  . Liver fibrosis 04/27/2018  . Thrombocytopenia ([place]) 04/27/2018  . Hypersplenism 04/27/2018  . Positive hepatitis C antibody test 04/27/2018  . Coronary artery disease without angina pectoris 02/25/2018  . Obstructive sleep apnea 10/25/2017  . Tinnitus 10/25/2017  . Chronic pain of both knees 08/25/2017  . Extrinsic asthma without complication 95/63/8756  . Vitamin D deficiency 08/25/2017  . HIV disease ([place]) 04/15/2017  . Primary osteoarthritis of both knees 04/15/2017  . Type 2 diabetes mellitus with hyperglycemia, without long-term current use of insulin ([place]) 04/15/2017  . Essential hypertension 04/15/2017    Current Outpatient Medications  Medication Sig Dispense Refill  . amLODipine (NORVASC) 2.5 MG tablet Take 2.5 mg by mouth daily.    Marland Kitchen aspirin 81 MG tablet Take 81 mg by mouth daily.    Marland Kitchen atenolol (TENORMIN) 50 MG tablet Take 50 mg by mouth daily.  3  . clopidogrel (PLAVIX) 75 MG tablet TAKE 1 TABLET BY MOUTH DAILY 90 tablet 1  . losartan-hydrochlorothiazide (HYZAAR) 100-25 MG tablet Take 1 tablet by mouth daily.   3  . Multiple Vitamin (MULTIVITAMIN) tablet Take 1 tablet by mouth daily.    . ODEFSEY 200-25-25 MG TABS tablet TAKE 1 TABLET BY MOUTH DAILY WITH BREAKFAST. 30 tablet 4  . omega-3 acid ethyl esters (LOVAZA) 1 g capsule Take 1 g by mouth daily.     . rosuvastatin (CRESTOR) 10 MG tablet Take 10 mg by mouth daily.   3  . sitaGLIPtin (JANUVIA) 100 MG tablet Take 100 mg by mouth daily.    Marland Kitchen SYNJARDY XR 12.07-998 MG TB24 TK 2 TS PO QD  3  . VASCEPA 1 g CAPS TK 2 CS PO BID  11  . benzonatate (TESSALON) 100 MG capsule Take 1 capsule (100 mg total) by mouth 2 (two)  times daily as needed for cough. 20 capsule 0  . oseltamivir (TAMIFLU) 75 MG capsule Take 1 capsule (75 mg total) by mouth 2 (two) times daily. 10 capsule 0   No current facility-administered medications for this visit.     Allergies: Lopressor [metoprolol tartrate] and Integrilin [eptifibatide]  Past Medical History:  Diagnosis Date  . Abnormal result of cardiovascular function study, unspecified   . Atherosclerotic heart disease of native coronary artery without angina pectoris   . CAD (coronary artery disease)   . Coronary angioplasty status   . Diabetes mellitus without complication ([place])   . Essential (primary) hypertension   . Gallstones   . Hepatic cirrhosis ([place])   . Hepatitis C   . HIV infection ([place])   . Hyperlipidemia   . Hypertension   . Internal hemorrhoids   . Kidney stones   . Mixed hyperlipidemia   . Palpitations   . Shortness of breath   . Sleep apnea    CPAP  . Tubular adenoma of colon     Past Surgical History:  Procedure Laterality Date  . CARDIAC CATHETERIZATION Left 04/2013  . chlecystectomy    . PERCUTANEOUS CORONARY STENT INTERVENTION (PCI-S)    . UMBILICAL HERNIA REPAIR      Family History  Problem Relation Age of Onset  . Emphysema Mother  pulmonary  . Heart attack Father 74  . Lung cancer Sister   . Esophageal cancer Brother   . Esophageal cancer Brother   . Colon cancer Neg Hx   . Inflammatory bowel disease Neg Hx   . Liver disease Neg Hx   . Pancreatic cancer Neg Hx   . Rectal cancer Neg Hx   . Stomach cancer Neg Hx     Social History   Tobacco Use  . Smoking status: Former Smoker    Types: Cigarettes    Last attempt to quit: 1994    Years since quitting: 26.1  . Smokeless tobacco: Never Used  Substance Use Topics  . Alcohol use: Yes    Frequency: Never    Comment: social     Subjective:  Tyler Hendricks is here today requesting evaluation of acute complaint of cough/cold symptoms, which first began about 1-2 days  ago Reports: fevers, chills, malaise, body aches, headaches, nasuea Denies: syncope, confusion, sinus pain/pressure, nasal congestion /drainage, sore throat, chest pain, shortness of breath, wheezing, abdominal pain, vomiting, diarrhea Smoker? Former. Tried at home: nyquil, dayquil with minimal relief Improvement/worsening since onset?  ROS- See HPI   Objective:  Vitals:   05/23/18 0921  BP: 138/80  Pulse: 88  Temp: 99 F (37.2 C)  TempSrc: Oral  SpO2: 94%  Weight: 216 lb (98 kg)  Height: 5\' 11"  (1.803 m)    General: Well developed, well nourished, in no acute distress  Skin : Warm and dry.  Head: Normocephalic and atraumatic  Eyes: Sclera and conjunctiva clear; pupils round and reactive to light; extraocular movements intact  Ears: External normal; canals clear; tympanic membranes normal  Oropharynx: Pink, supple. No suspicious lesions  Neck: Supple Lungs: Respirations unlabored; clear to auscultation bilaterally without wheeze, rales, rhonchi  CVS exam: normal rate and regular rhythm, S1 and S2 normal.  Extremities: No edema, cyanosis, clubbing  Vessels: Symmetric bilaterally  Neurologic: Alert and oriented; speech intact; face symmetrical; moves all extremities well; CNII-XII intact without focal deficit  Psychiatric: Normal mood and affect.  Assessment:  1. Flu   2. Flu-like symptoms     Plan:   POC flu testing positive for flu A Tamiflu course sent, along with tessalon for cough- med dosing, side effects discussed Home management, OTC medications, red flags and strict return precautions including when to seek immediate care discussed and printed on AVS He will f/u for new, worsening symptoms or if symptoms persist after tamiflu course/or more than 1 week  No follow-ups on file.  Orders Placed This Encounter  Procedures  . POC Influenza A&B (Binax test)    Requested Prescriptions   Signed Prescriptions Disp Refills  . oseltamivir (TAMIFLU) 75 MG capsule 10  capsule 0    Sig: Take 1 capsule (75 mg total) by mouth 2 (two) times daily.  . benzonatate (TESSALON) 100 MG capsule 20 capsule 0    Sig: Take 1 capsule (100 mg total) by mouth 2 (two) times daily as needed for cough.

## 2018-05-26 ENCOUNTER — Encounter: Payer: Medicare Other | Admitting: Gastroenterology

## 2018-06-02 ENCOUNTER — Encounter: Payer: Medicare Other | Admitting: Gastroenterology

## 2018-06-07 DIAGNOSIS — L821 Other seborrheic keratosis: Secondary | ICD-10-CM | POA: Diagnosis not present

## 2018-06-07 DIAGNOSIS — D1801 Hemangioma of skin and subcutaneous tissue: Secondary | ICD-10-CM | POA: Diagnosis not present

## 2018-06-07 DIAGNOSIS — D229 Melanocytic nevi, unspecified: Secondary | ICD-10-CM | POA: Diagnosis not present

## 2018-06-07 DIAGNOSIS — L570 Actinic keratosis: Secondary | ICD-10-CM | POA: Diagnosis not present

## 2018-06-07 DIAGNOSIS — L578 Other skin changes due to chronic exposure to nonionizing radiation: Secondary | ICD-10-CM | POA: Diagnosis not present

## 2018-06-08 ENCOUNTER — Ambulatory Visit (INDEPENDENT_AMBULATORY_CARE_PROVIDER_SITE_OTHER): Payer: Medicare Other | Admitting: Podiatry

## 2018-06-08 ENCOUNTER — Other Ambulatory Visit: Payer: Self-pay

## 2018-06-08 ENCOUNTER — Encounter: Payer: Self-pay | Admitting: Podiatry

## 2018-06-08 DIAGNOSIS — M79674 Pain in right toe(s): Secondary | ICD-10-CM

## 2018-06-08 DIAGNOSIS — M79675 Pain in left toe(s): Secondary | ICD-10-CM | POA: Diagnosis not present

## 2018-06-08 DIAGNOSIS — B351 Tinea unguium: Secondary | ICD-10-CM | POA: Diagnosis not present

## 2018-06-08 DIAGNOSIS — E1142 Type 2 diabetes mellitus with diabetic polyneuropathy: Secondary | ICD-10-CM

## 2018-06-08 NOTE — Progress Notes (Signed)
Complaint:  Visit Type: Patient returns to my office for continued preventative foot care services. Complaint: Patient states" my nails have grown long and thick and become painful to walk and wear shoes".  He says the toenails on both feet are ingrown and painful.  Patient has been diagnosed with DM with no foot complications. The patient presents for preventative foot care services. No changes to ROS  Podiatric Exam: Vascular: dorsalis pedis and posterior tibial pulses are palpable bilateral. Capillary return is immediate. Temperature gradient is WNL. Skin turgor WNL  Sensorium: Normal Semmes Weinstein monofilament test. Normal tactile sensation bilaterally. Nail Exam: Pt has thick disfigured discolored nails with subungual debris noted hallux  Bilateral. Ulcer Exam: There is no evidence of ulcer or pre-ulcerative changes or infection. Orthopedic Exam: Muscle tone and strength are WNL. No limitations in general ROM. No crepitus or effusions noted. Foot type and digits show no abnormalities. Bony prominences are unremarkable. Skin: No Porokeratosis. No infection or ulcers  Diagnosis:  Onychomycosis, , Pain in right toe, pain in left toes  Treatment & Plan Procedures and Treatment: Consent by patient was obtained for treatment procedures.   Debridement of mycotic and hypertrophic toenails, 1 through 5 bilateral and clearing of subungual debris. No ulceration, no infection noted. Iatrogenic lesion followed by cauterization left hallux.  DSD. Return Visit-Office Procedure: Patient instructed to return to the office for a follow up visit 3 months for continued evaluation and treatment.    Gardiner Barefoot DPM

## 2018-06-15 ENCOUNTER — Telehealth: Payer: Self-pay | Admitting: Nurse Practitioner

## 2018-06-15 MED ORDER — BENZONATATE 100 MG PO CAPS: 100.0000 mg | ORAL_CAPSULE | Freq: Two times a day (BID) | ORAL | 0 refills | Status: DC | PRN

## 2018-06-15 NOTE — Telephone Encounter (Signed)
Please advise in PCP's absence. See 05/23/18 OV with PCP. Thanks!

## 2018-06-15 NOTE — Telephone Encounter (Signed)
Copied from [place] [id number]. Topic: Quick Communication - See Telephone Encounter >> Jun 15, 2018  9:56 AM Burchel, Abbi R wrote: CRM for notification. See Telephone encounter for: 06/15/18.  Pt requesting call from [id number] cough.  Please call pt:  [phone]

## 2018-06-15 NOTE — Telephone Encounter (Signed)
See 06/15/18 refill encounter. Closing encounter.

## 2018-06-15 NOTE — Telephone Encounter (Signed)
Refill sent to pharmacy.   

## 2018-06-15 NOTE — Telephone Encounter (Signed)
Left detailed message informing pt of below.  

## 2018-06-15 NOTE — Telephone Encounter (Signed)
Copied from [place] [id number] [id number]. Topic: Quick Communication - Rx Refill/Question >> Jun 15, 2018  9:52 AM Burchel, Abbi R wrote: Medication: benzonatate (TESSALON) 100 MG capsule  Pt states he is almost out of the pearls and is still coughing.  Please refill.   Preferred Pharmacy: [facility] [place], [place] - [address] AT [address]  [phone] (Phone) [phone] (Fax)  Pt was advised that RX refills may take up to 3 business days. We ask that you follow-up with your pharmacy.

## 2018-06-15 NOTE — Telephone Encounter (Signed)
Patient still wants a call from [facility] regarding his cough.

## 2018-06-16 MED FILL — ODEFSEY 200-25-25 MG TABS: 200-25-25 | 30 days supply | Qty: 30 | Fill #1

## 2018-06-18 ENCOUNTER — Other Ambulatory Visit: Payer: Self-pay | Admitting: Nurse Practitioner

## 2018-06-18 MED ORDER — FLUTICASONE FUROATE-VILANTEROL 200-25 MCG/INH IN AEPB: 1.0000 | INHALATION_SPRAY | Freq: Every day | RESPIRATORY_TRACT | 0 refills | Status: DC

## 2018-06-18 MED FILL — BREO ELLIPTA 200-25 MCG INH: 200-25 | 30 days supply | Qty: 60 | Fill #0

## 2018-06-23 DIAGNOSIS — M25561 Pain in right knee: Secondary | ICD-10-CM | POA: Diagnosis not present

## 2018-06-23 DIAGNOSIS — M25562 Pain in left knee: Secondary | ICD-10-CM | POA: Diagnosis not present

## 2018-06-23 NOTE — Telephone Encounter (Signed)
Patient states the tessalon pearls are not helping his cough - requesting a cough syrup. He would like the nurse to call him [phone]

## 2018-06-24 ENCOUNTER — Encounter: Payer: Self-pay | Admitting: Internal Medicine

## 2018-06-24 ENCOUNTER — Ambulatory Visit (INDEPENDENT_AMBULATORY_CARE_PROVIDER_SITE_OTHER): Payer: Medicare Other | Admitting: Internal Medicine

## 2018-06-24 DIAGNOSIS — R059 Cough, unspecified: Secondary | ICD-10-CM

## 2018-06-24 DIAGNOSIS — J45909 Unspecified asthma, uncomplicated: Secondary | ICD-10-CM | POA: Diagnosis not present

## 2018-06-24 DIAGNOSIS — R05 Cough: Secondary | ICD-10-CM | POA: Diagnosis not present

## 2018-06-24 DIAGNOSIS — R053 Chronic cough: Secondary | ICD-10-CM | POA: Insufficient documentation

## 2018-06-24 MED ORDER — PROMETHAZINE-DM 6.25-15 MG/5ML PO SYRP: 5.0000 mL | ORAL_SOLUTION | Freq: Four times a day (QID) | ORAL | 0 refills | Status: DC | PRN

## 2018-06-24 NOTE — Telephone Encounter (Signed)
Since it has been a month since he saw Ashleigh for these symptoms should we see if patient is able to do a virtual visit or are we able to send in a cough syrup?

## 2018-06-24 NOTE — Telephone Encounter (Signed)
Would need virtual visit as most cough medications are controlled.

## 2018-06-24 NOTE — Telephone Encounter (Signed)
Can you set patient up for a virtual visit if wanting cough syrup. Thank you

## 2018-06-24 NOTE — Progress Notes (Signed)
Virtual Visit via Video Note  I connected with Georges Lynch on 06/24/18 at  3:40 PM EDT by a video enabled telemedicine application and verified that I am speaking with the correct person using two identifiers.   I discussed the limitations of evaluation and management by telemedicine and the availability of in person appointments. The patient expressed understanding and agreed to proceed.  History of Present Illness: The patient is a 68 y.o. YO man with visit for cough. Started about 1 month ago and treated as flu at that time. Has been taking tessalon perles since that time for cough. He does have concurrent HIV, asthma, and cirrhosis. Has stable symptoms and worse during the day. Not coughing much at night time. Denies fevers or chills or SOB or headaches or body aches. Overall it is stable. Has tried tessalon perles which has not helped much. Also taking zyrtec generic daily for the last week or so with minimal if any improvement.Takes breo daily and denies missing doses.   Observations/Objective: Appearance: normal, breathing appears normal, coughing during visit without production and did get slightly dyspneic while coughing for an extended time, normal grooming, abdomen not visualized, throat normal, mental status is A and O times 3  Assessment and Plan: See problem oriented charting  Follow Up Instructions: rx for promethazine/dm cough syrup and if no improvement may need CXR and/or FENO for evaluation  I discussed the assessment and treatment plan with the patient. The patient was provided an opportunity to ask questions and all were answered. The patient agreed with the plan and demonstrated an understanding of the instructions.   The patient was advised to call back or seek an in-person evaluation if the symptoms worsen or if the condition fails to improve as anticipated.  Hoyt Koch, MD

## 2018-06-24 NOTE — Telephone Encounter (Signed)
Virtual Visit has been made for today at 340p

## 2018-06-24 NOTE — Assessment & Plan Note (Addendum)
Rx for promethazine/dm cough syrup for cough. If not resolving would be reasonable to consider CXR in several weeks. Does not sound like coronavirus at this time. Given his cirrhosis in history and he admits large abdomen normally concern for some ascites or pleural effusion causing irritation and coughing could be in the differential. Has concurrent HIV but last viral load undetected and CD4 count normal range.

## 2018-06-24 NOTE — Assessment & Plan Note (Signed)
No real sign of flare today. Would like him to follow up in a couple of months for FENO and sooner if worsening. Taking breo daily.

## 2018-06-27 ENCOUNTER — Telehealth: Payer: Self-pay | Admitting: Neurology

## 2018-06-27 NOTE — Telephone Encounter (Signed)
Due to current COVID 19 pandemic, our office is severely reducing in office visits for at least the next 2 weeks, in order to minimize the risk to our patients and healthcare providers.   Called patient to offer a virtual visit with Dr. Rexene Alberts. Walked patient through the virtual visit process and stayed on the phone while he downloaded cisco app to his phone. I advised him to check his e-mail for an e-mail from [place] regarding further instructions for his appointment.  Pt understands that although there may be some limitations with this type of visit, we will take all precautions to reduce any security or privacy concerns.  Pt understands that this will be treated like an in office visit and we will file with pt's insurance, and there may be a patient responsible charge related to this service.  Pt's email is [email or web address]. Pt understands that the cisco webex software must be downloaded and operational on the device pt plans to use for the visit.

## 2018-06-27 NOTE — Telephone Encounter (Signed)
I called pt. Pt's meds, allergies, and PMH were updated.  Pt reports that he had a sleep study about one year ago. Pt reports that he was told to start a cpap. Pt does not use the cpap every night. He does not like his mask. Pt reports that he does not have a DME.  Pt reports that his recent weight is 211lbs and he is 5' 11.25.  Pt was instructed on how to measure his neck circumference.  [place] Sleepiness Scale 0= would never doze 1= slight chance of dozing 2= moderate chance of dozing 3= high chance of dozing  Sitting and reading: 1 Watching TV: 3 Sitting inactive in a public place (ex. Theater or meeting): 2 As a passenger in a car for an hour without a break: 1 Lying down to rest in the afternoon: 1 Sitting and talking to someone: 1 Sitting quietly after lunch (no alcohol): 2 In a car, while stopped in traffic: 0 Total: 11  FSS: 33

## 2018-06-29 ENCOUNTER — Encounter: Payer: Self-pay | Admitting: Neurology

## 2018-06-29 ENCOUNTER — Other Ambulatory Visit: Payer: Self-pay

## 2018-06-29 ENCOUNTER — Ambulatory Visit (INDEPENDENT_AMBULATORY_CARE_PROVIDER_SITE_OTHER): Payer: Medicare Other | Admitting: Neurology

## 2018-06-29 DIAGNOSIS — Z789 Other specified health status: Secondary | ICD-10-CM

## 2018-06-29 DIAGNOSIS — G4733 Obstructive sleep apnea (adult) (pediatric): Secondary | ICD-10-CM

## 2018-06-29 DIAGNOSIS — R351 Nocturia: Secondary | ICD-10-CM | POA: Diagnosis not present

## 2018-06-29 DIAGNOSIS — G4719 Other hypersomnia: Secondary | ICD-10-CM | POA: Diagnosis not present

## 2018-06-29 NOTE — Progress Notes (Signed)
Star Age, MD, PhD [facility] [address], [address] [place], [place] [address]   Virtual Visit via Video Note on 06/29/2018:  I connected with Mr. Sanguinetti on 06/29/18 at  9:00 AM EDT by a video enabled telemedicine application and verified that I am speaking with the correct person using two identifiers.   I discussed the limitations of evaluation and management by telemedicine and the availability of in person appointments. The patient expressed understanding and agreed to proceed.  History of Present Illness:  Mr. Kenry Daubert is a 68 year old right-handed gentleman with an underlying medical history of hypertension, hyperlipidemia, HIV, history of hepatitis C, liver cirrhosis, history of gallstones, diabetes, coronary artery disease with history of coronary angioplasty, history of kidney stones and obesity, with whom I am conducting a virtual, video based new patient visit via Webex in lieu of a face-to-face visit for evaluation of his sleep disorder, in particular, reevaluation of his obstructive sleep apnea to establish care. The patient is unaccompanied today (wife in the background) and joins via laptop from home. He is referred by his ID NP, Mauricio Po and I reviewed his office note from 05/11/2018. He recently saw Dr. Sharlet Salina for a virtual visit on 06/24/2018 for a one-month history of cough. I reviewed the note. His Epworth sleepiness score is 11 out of 24, fatigue severity score is 33 out of 63.  He [place] sleep [place] of state, I was able to review a PSG report from 09/14/2015 which was done in [place], [place]. Was 234 pounds at the time for a BMI of 30.9. Sleep efficiency was 79.4%, sleep latency 11 minutes, REM latency 33.5 minutes. AHI was 14 per hour REM AHI was 44.5 per hour. The study was apparently conducted with an oral appliance in place and he was noted to have persistent sleep apnea with oral appliance in place and  prominent PVCs were noted. Was advised to follow-up with cardiology. He had a CPAP titration study at the same location on 06/27/2013 he was titrated from CPAP of 4 cm to 15 cm and then on BiPAP of 15/11 and 17/13 cm. Frequent PVCs were seen. Patient was reported to tolerate CPAP well and CPAP of 12 cm was recommended. He had a baseline sleep study on 06/12/2013 at the same location which demonstrated an AHI of 12.5 per hour, REM related AHI of 39.3 per hour. O2 nadir appeared to be 79%. A CPAP compliance download is not available for my review. he reports that he has a chip in the machine. We could try to get access to a download later on. He has had trouble tolerating the pressure and the mass, has been using a full facemask most recently, machine is almost 68 years old. In the beginning he felt some improvement, was less tired and started off with a nasal mask but was a mouth breather and switched over to a full facemask. He would be willing to get back on a CPAP or AutoPap and would be willing to get repeat evaluated. He has been trying to lose weight to some degree. He retired in 2011, was a heavy Company secretary. Current bedtime around 10 and rise time between 4 and 5. Lives with his wife, has a biological daughter in this area, moved to [place] about a year ago in March. Has stepchildren in [place]. He has no family history of OSA. He quit smoking in 1999. He has a dog in the  household, watches TV in his bedroom until it is time to sleep, does like to read before sleeping. He denies morning headaches but has significant nocturia about 3 times per average night. His cough has been a little better since he took the cough medicine. He reports being up-to-date for all vaccines and he was treated for hep C in the past. He has seen a liver specialist.   His Past Medical History Is Significant For: Past Medical History:  Diagnosis Date   Abnormal result of cardiovascular function study, unspecified     Atherosclerotic heart disease of native coronary artery without angina pectoris    CAD (coronary artery disease)    Coronary angioplasty status    Diabetes mellitus without complication ([place])    Essential (primary) hypertension    Gallstones    Hepatic cirrhosis (HCC)    Hepatitis C    HIV infection ([place])    Hyperlipidemia    Hypertension    Internal hemorrhoids    Kidney stones    Mixed hyperlipidemia    Palpitations    Shortness of breath    Sleep apnea    CPAP   Tubular adenoma of colon     His Past Surgical History Is Significant For: Past Surgical History:  Procedure Laterality Date   CARDIAC CATHETERIZATION Left 04/2013   chlecystectomy     PERCUTANEOUS CORONARY STENT INTERVENTION (PCI-S)     UMBILICAL HERNIA REPAIR      His Family History Is Significant For: Family History  Problem Relation Age of Onset   Emphysema Mother        pulmonary   Heart attack Father 26   Lung cancer Sister    Esophageal cancer Brother    Esophageal cancer Brother    Colon cancer Neg Hx    Inflammatory bowel disease Neg Hx    Liver disease Neg Hx    Pancreatic cancer Neg Hx    Rectal cancer Neg Hx    Stomach cancer Neg Hx     His Social History Is Significant For: Social History   Socioeconomic History   Marital status: Married    Spouse name: Not on file   Number of children: 1   Years of education: Not on file   Highest education level: 10th grade  Occupational History   Occupation: retired  Scientist, 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:    Worry: Never true    Inability: Never true   Transportation needs:    Medical: No    Non-medical: No  Tobacco Use   Smoking status: Former Smoker    Types: Cigarettes    Last attempt to quit: 1994    Years since quitting: 26.2   Smokeless tobacco: Never Used  Substance and Sexual Activity   Alcohol use: Yes    Frequency: Never    Comment: social    Drug use: No   Sexual activity: Yes  Lifestyle   Physical activity:    Days per week: Not on file    Minutes per session: Not on file   Stress: Not on file  Relationships   Social connections:    Talks on phone: Not on file    Gets together: Not on file    Attends religious service: Not on file    Active member of club or organization: Not on file    Attends meetings of clubs or organizations: Not on file    Relationship status: Not on file  Other Topics Concern   Not on file  Social History Narrative   Not on file    His Allergies Are:  Allergies  Allergen Reactions   Lopressor [Metoprolol Tartrate] Rash and Other (See Comments)    Bleeding (non-specific)    Integrilin [Eptifibatide] Other (See Comments)    Bleeding (non-specific)  :   His Current Medications Are:  Outpatient Encounter Medications as of 06/29/2018  Medication Sig   amLODipine (NORVASC) 2.5 MG tablet Take 2.5 mg by mouth daily.   aspirin 81 MG tablet Take 81 mg by mouth daily.   atenolol (TENORMIN) 50 MG tablet Take 50 mg by mouth daily.   benzonatate (TESSALON) 100 MG capsule Take 1 capsule (100 mg total) by mouth 2 (two) times daily as needed for cough.   clopidogrel (PLAVIX) 75 MG tablet TAKE 1 TABLET BY MOUTH DAILY   fluticasone furoate-vilanterol (BREO ELLIPTA) 200-25 MCG/INH AEPB Inhale 1 puff into the lungs daily.   losartan-hydrochlorothiazide (HYZAAR) 100-25 MG tablet Take 1 tablet by mouth daily.    Multiple Vitamin (MULTIVITAMIN) tablet Take 1 tablet by mouth daily.   ODEFSEY 200-25-25 MG TABS tablet TAKE 1 TABLET BY MOUTH DAILY WITH BREAKFAST.   omega-3 acid ethyl esters (LOVAZA) 1 g capsule Take 1 g by mouth daily.    oxyCODONE-acetaminophen (PERCOCET/ROXICET) 5-325 MG tablet TK 1 T PO QID PRN   promethazine-dextromethorphan (PROMETHAZINE-DM) 6.25-15 MG/5ML syrup Take 5 mLs by mouth 4 (four) times daily as needed for cough.   rosuvastatin (CRESTOR) 10 MG tablet Take 10 mg  by mouth daily.    sitaGLIPtin (JANUVIA) 100 MG tablet Take 100 mg by mouth daily.   SYNJARDY XR 12.07-998 MG TB24 TK 2 TS PO QD   VASCEPA 1 g CAPS TK 2 CS PO BID   No facility-administered encounter medications on file as of 06/29/2018.   :   Review of Systems:  Out of a complete 14 point review of systems, all are reviewed and negative with the exception of these symptoms as listed below:  Observations/Objective: The most recent available vital signs for my review are from 05/23/2018: Blood pressure 138/80, pulse 88, temp 99, weight 216 pounds for a BMI of 30.13. His most recent weight at home by self report is 211 lb, neck circumference by self-report is 19 inches. He is pleasant, conversant, in no acute distress, extraocular movements are intact, no corrective eyeglasses. Face is symmetric, speech clear without dysarthria or voice tremor. Airway examination reveals a Mallampati class II, tonsils are not fully visualized, wider tongue noted, tongue protrudes centrally and palate elevates symmetrically. He is able to stand and walk freely. Upper body movements and upper extremity coordination are preserved.  Assessment and Plan: In summary, Khambrel Amsden is a very pleasant 68 y.o.-year old male with whom I am conducting a virtual, video based new patient visit via Webex in lieu of a face-to-face visit for evaluation of an underlying organic sleep disorder, in particular, reevaluation of his prior diagnosis of moderate obstructive sleep apnea. The patient's medical history and physical exam (albeit limited with current video-based evaluation) are concerning for a diagnosis of obstructive sleep apnea. I discussed with the patient the diagnosis of OSA, its prognosis and treatment options. I explained in particular the risks and ramifications of untreated moderate to severe OSA, especially with respect to developing cardiovascular disease down the Road, including congestive heart failure, difficult  to treat hypertension, cardiac arrhythmias, or stroke. Symptoms of untreated OSA were discussed, including daytime sleepiness, nocturia,  and others. We talked about the importance of weight control.  I recommended the following at this time: home sleep test.  I explained the sleep test procedure to the patient and he indicated that he would be willing to try CPAP or autoPAP, if the need arises.he should be eligible for a new equipment.  I answered all his questions today and the patient was in agreement. I plan to see the patient back after the sleep study is completed. Star Age, MD, PhD  Follow Up Instructions: 1. HST, single user, disposable unit, cloud-based data collection and retrieval. Sleep lab staff will reach out to patient to arrange for sending the unit to home address and for tutorial, making sure patient is comfortable with the unit and smart phone app. 2. Consider AutoPap therapy if home sleep test positive for obstructive sleep apnea. 3. Follow-up after starting AutoPap therapy, follow-up to be scheduled according to set-up date. 4. Pursue healthy lifestyle, good sleep hygiene, weight loss. 5. Call for any interim questions or concerns.  I discussed the assessment and treatment plan with the patient. The patient was provided an opportunity to ask questions and all were answered. The patient agreed with the plan and demonstrated an understanding of the instructions.   The patient was advised to call back or seek an in-person evaluation if the symptoms worsen or if the condition fails to improve as anticipated.  I provided 30 minutes of non-face-to-face time during this encounter.   Star Age, MD

## 2018-06-29 NOTE — Patient Instructions (Signed)
Given verbally, during today's virtual video-based encounter, with verbal feedback received.   

## 2018-07-12 MED FILL — BREO ELLIPTA 200-25 MCG INH: 200-25 | 30 days supply | Qty: 60 | Fill #1

## 2018-07-12 MED FILL — ODEFSEY 200-25-25 MG TABS: 200-25-25 | 30 days supply | Qty: 30 | Fill #2

## 2018-07-18 ENCOUNTER — Other Ambulatory Visit: Payer: Self-pay

## 2018-07-18 ENCOUNTER — Ambulatory Visit (INDEPENDENT_AMBULATORY_CARE_PROVIDER_SITE_OTHER): Payer: Medicare Other | Admitting: Neurology

## 2018-07-18 DIAGNOSIS — G4733 Obstructive sleep apnea (adult) (pediatric): Secondary | ICD-10-CM | POA: Diagnosis not present

## 2018-07-18 DIAGNOSIS — G4719 Other hypersomnia: Secondary | ICD-10-CM

## 2018-07-18 DIAGNOSIS — Z789 Other specified health status: Secondary | ICD-10-CM

## 2018-07-18 DIAGNOSIS — R351 Nocturia: Secondary | ICD-10-CM

## 2018-07-19 ENCOUNTER — Telehealth: Payer: Self-pay | Admitting: Internal Medicine

## 2018-07-19 NOTE — Telephone Encounter (Signed)
Copied from [place] [id number]. Topic: Quick Communication - Rx Refill/Question >> Jul 19, 2018 10:04 AM Celene Kras A wrote: Medication: benzonatate (TESSALON) 100 MG capsule  Has the patient contacted their pharmacy? No. Pt states his cough will not go away, and is also requesting a codeine based cough medicine. Pt states they work better for him. Please advise.  (Agent: If no, request that the patient contact the pharmacy for the refill.) (Agent: If yes, when and what did the pharmacy advise?)  Preferred Pharmacy (with phone number or street name): [facility] [address] [place] [address] Phone: [phone] Fax: [phone] Not a 24 hour pharmacy; exact hours not known.    Agent: Please be advised that RX refills may take up to 3 business days. We ask that you follow-up with your pharmacy.

## 2018-07-20 NOTE — Telephone Encounter (Signed)
You saw him last on 06/24/18 for same sxs. Do you want him to have CXR now? Ok to refill  Benzonatate and codeine cough syrup? Please advise. Thanks!

## 2018-07-20 NOTE — Telephone Encounter (Signed)
Patient prefers early am appt on 07/21/18. Virtual visit scheduled with Dr. Sharlet Salina @ 8:00 am.

## 2018-07-20 NOTE — Telephone Encounter (Signed)
Would recommend follow up visit with someone if not feeling improved since that visit was almost a month ago

## 2018-07-21 ENCOUNTER — Ambulatory Visit (INDEPENDENT_AMBULATORY_CARE_PROVIDER_SITE_OTHER): Payer: Medicare Other | Admitting: Internal Medicine

## 2018-07-21 ENCOUNTER — Encounter: Payer: Self-pay | Admitting: Internal Medicine

## 2018-07-21 DIAGNOSIS — R05 Cough: Secondary | ICD-10-CM

## 2018-07-21 DIAGNOSIS — J45909 Unspecified asthma, uncomplicated: Secondary | ICD-10-CM

## 2018-07-21 DIAGNOSIS — R059 Cough, unspecified: Secondary | ICD-10-CM

## 2018-07-21 MED ORDER — BENZONATATE 200 MG PO CAPS: 200.0000 mg | ORAL_CAPSULE | Freq: Three times a day (TID) | ORAL | 1 refills | Status: DC | PRN

## 2018-07-21 MED ORDER — PROMETHAZINE-DM 6.25-15 MG/5ML PO SYRP: 5.0000 mL | ORAL_SOLUTION | Freq: Four times a day (QID) | ORAL | 0 refills | Status: DC | PRN

## 2018-07-21 NOTE — Progress Notes (Signed)
Virtual Visit via Video Note  I connected with Tyler Hendricks on 07/21/18 at  8:00 AM EDT by a video enabled telemedicine application and verified that I am speaking with the correct person using two identifiers.   I discussed the limitations of evaluation and management by telemedicine and the availability of in person appointments. The patient expressed understanding and agreed to proceed.  History of Present Illness: The patient is a 68 y.o. man with visit for persistent cough. Started a couple of months ago at least. He is not sure of exact timeline. Denies fevers or chills. Denies SOB. Has concurrent asthma and is on breo although he denies that diagnosis. Has a lot of allergies which he feels is the cause. He did take promethazine/dm cough medicine in the past with good success. Some success with tessalon perles in the past. Not using rescue inhaler. Has concurrent HIV and cirrhosis and large abdominal girth. Denies that this is increasing recently. Denies covid symptoms or contacts. Is practicing social distancing but works outside and does not wear mask to help with pollen. Overall it is stable. Work like refill on cough syrup as this helped more than the perles.   Observations/Objective: Appearance: normal, breathing appears normal, casual grooming, abdomen does appear distended, throat normal, mental status is A and O times 3  Assessment and Plan: See problem oriented charting  Follow Up Instructions: rx for tessalon perles and promethazine/dm cough syrup  I discussed the assessment and treatment plan with the patient. The patient was provided an opportunity to ask questions and all were answered. The patient agreed with the plan and demonstrated an understanding of the instructions.   The patient was advised to call back or seek an in-person evaluation if the symptoms worsen or if the condition fails to improve as anticipated.  Tyler Koch, MD

## 2018-07-21 NOTE — Assessment & Plan Note (Signed)
No signs of flare today, he denies this diagnosis.

## 2018-07-21 NOTE — Assessment & Plan Note (Signed)
Rx for tessalon perles and promethazine cough syrup. Talked to him about allergy treatment and wearing mask outdoors if possible so he is not ingesting as much pollen. No signs of coronavirus or asthma flare. Could be related to abdominal girth from cirrhosis as well.

## 2018-07-26 ENCOUNTER — Telehealth: Payer: Self-pay

## 2018-07-26 NOTE — Addendum Note (Signed)
Addended by: Star Age on: 07/26/2018 08:07 AM   Modules accepted: Orders

## 2018-07-26 NOTE — Telephone Encounter (Signed)
-----   Message from Star Age, MD sent at 07/26/2018  8:07 AM EDT ----- Patient referred by Mauricio Po, NP (in ID), seen virtually by me on 06/29/18, HST on 07/18/18.    Please call and notify the patient that the recent home sleep test showed obstructive sleep apnea in the severe range. While I recommend treatment for this in the form CPAP, we are not yet bringing patients in for in-lab testing for CPAP titration studies, due to the virus pandemic; therefore, I suggest we start him on autoPAP at home, which means, that we don't have to bring him in for a sleep study with CPAP, but will let him start using an autoPAP machine at home, through a DME company (of patient's choice, or as per insurance requirement, as per in [employer], if there are such restrictions, depending on insurance carrier). The DME representative will educate the patient on how to use the machine, how to put the mask on, etc. I have placed an order in the chart. Please send referral, talk to patient, send report to referring MD. We will need a FU in sleep clinic for 10 weeks post-PAP set up, please arrange that with me or one of our NPs.  Also, please remind patient about the importance of compliance with PAP usage, he has been on CPAP, so should be aware, that compliance with treatment is an Designer, industrial/product, but good compliance also helps [place] track improvements in patient's sleep related complaints and objective improvements, such as BP and weight for example or nocturia or headaches, etc. For concerns and questions about how to clean the PAP machine and the supplies and how frequently to change the hose, mask and filters, etc., patient can call the DME company, for more information, education and troubleshooting. Especially in the current situation, I recommend, patients be extra mindful about hand hygiene, handling the PAP equipment only with clean hands, wipe the mask daily, keep little one and four-legged companions (and any  other pets for that matter) away from the machine and mask at all times.     Thanks,   Star Age, MD, PhD [facility])

## 2018-07-26 NOTE — Progress Notes (Signed)
Home sleep test encounter.

## 2018-07-26 NOTE — Progress Notes (Signed)
Patient referred by Mauricio Po, NP (in ID), seen virtually by me on 06/29/18, HST on 07/18/18.    Please call and notify the patient that the recent home sleep test showed obstructive sleep apnea in the severe range. While I recommend treatment for this in the form CPAP, we are not yet bringing patients in for in-lab testing for CPAP titration studies, due to the virus pandemic; therefore, I suggest we start him on autoPAP at home, which means, that we don't have to bring him in for a sleep study with CPAP, but will let him start using an autoPAP machine at home, through a DME company (of patient's choice, or as per insurance requirement, as per in [employer], if there are such restrictions, depending on insurance carrier). The DME representative will educate the patient on how to use the machine, how to put the mask on, etc. I have placed an order in the chart. Please send referral, talk to patient, send report to referring MD. We will need a FU in sleep clinic for 10 weeks post-PAP set up, please arrange that with me or one of our NPs.  Also, please remind patient about the importance of compliance with PAP usage, he has been on CPAP, so should be aware, that compliance with treatment is an Designer, industrial/product, but good compliance also helps [place] track improvements in patient's sleep related complaints and objective improvements, such as BP and weight for example or nocturia or headaches, etc. For concerns and questions about how to clean the PAP machine and the supplies and how frequently to change the hose, mask and filters, etc., patient can call the DME company, for more information, education and troubleshooting. Especially in the current situation, I recommend, patients be extra mindful about hand hygiene, handling the PAP equipment only with clean hands, wipe the mask daily, keep little one and four-legged companions (and any other pets for that matter) away from the machine and mask at all times.     Thanks,   Star Age, MD, PhD [facility])

## 2018-07-26 NOTE — Procedures (Signed)
Patient Information     First Name: Tyler Last Name: Hendricks ID: [id number]  Birth Date: [birth year] Age: 68 Gender: Male  Referring Provider: Mauricio Po, NP BMI: 30.13 (71/216lbs)   Neck Circ.:  19 Epworth: 11/24   Sleep Study Information     Study Date: Jul 18, 2018 S/H/A Version: [id number] / 4.1.1528 / 63  History:  68 year old man with a history of hypertension, hyperlipidemia, HIV, history of hepatitis C, liver cirrhosis, history of gallstones, diabetes and coronary artery disease with history of coronary angioplasty, history of kidney stones and obesity, who presents for re-evaluation of his prior diagnosis of OSA.   Diagnosis: OSA  Recommendations:  This home sleep test demonstrates severe obstructive sleep apnea with a total AHI of 50.3/hour and O2 nadir of 81%. Treatment with positive airway pressure (PAP) - in the form of CPAP - is recommended. This will require, ideally, a full night CPAP titration study for proper treatment settings, O2 monitoring and mask fitting. Based on the severity of the sleep disordered breathing, an attended titration study is indicated. However, under the current circumstances (i.e. the COVID-19 pandemic), in order to ensure continuity of care and for the safety of the patient and healthcare professionals, he will be advised to proceed with an autoPAP titration/trial at home. A proper overnight, lab-attended PAP titration study with CPAP may be helpful or needed down the road to optimize treatment, when considered safe. Please note, that untreated obstructive sleep apnea may carry additional perioperative morbidity. Patients with significant obstructive sleep apnea should receive perioperative PAP therapy and the surgeons and particularly the anesthesiologist should be informed of the diagnosis and the severity of the sleep disordered breathing. Patient will be reminded regarding compliance with the PAP machine and to be mindful of cleanliness with the  equipment and timely with supply changes (i.e. changing filter, mask, hose, humidifier chamber on an ongoing basis, as recommended, and cleaning parts that touch the face and nose daily, etc). The patient should be cautioned not to drive, work at heights, or operate dangerous or heavy equipment when tired or sleepy. Review and reiteration of good sleep hygiene measures should be pursued with any patient. Other causes of the patient's symptoms, including circadian rhythm disturbances, an underlying mood disorder, medication effect and/or an underlying medical problem cannot be ruled out based on this test. Clinical correlation is recommended. The patient and his referring provider will be notified of the test results. The patient will be seen in follow up in sleep clinic at [facility], either for a face-to-face or virtual visit, whichever feasible and recommended at the time.  I certify that I have reviewed the raw data recording prior to the issuance of this report in accordance with the standards of the American Academy of Sleep Medicine (AASM).  Star Age, MD, PhD [facility]) Diplomat, ABPN (Neurology and Sleep)                Sleep Summary    Oxygen Saturation Statistics     Start Study Time: End Study Time: Total Recording Time:  10:35:03 PM    4:48:34 AM   6 hrs, 13 min  Total Sleep Time % REM of Sleep Time:  5 hrs, 28 min  12.8    Mean:  92                  Minimum: 81  Maximum:  97   Mean of Desaturations Nadirs (%): 89  Oxygen Desatur. %:   4-9 10-20 >20 Total  Events Number Total   105  4 96.3 3.7  0 0.0  109 100.0  Oxygen Saturation: <90 <=88 <85 <80 <70  Duration (minutes): Sleep % 14.7 4.5  8.4 0.7  2.5 0.2 0.0 0.0 0.0 0.0     Respiratory Indices      Total Events REM NREM All Night  pRDI:  180  pAHI:  180 ODI:  109  pAHIc:  3  % CSR: 0.0 47.4 47.4 54.5 2.4 50.7 50.7 27.2 0.6 50.3 50.3 30.4 0.8       Pulse Rate  Statistics during Sleep (BPM)      Mean: 66 Minimum: 37 Maximum: 85      Indices are calculated using technically valid sleep time of  3 hrs, 34 min. pRDI/pAHI are calculated using oxi desaturations ? 3%  Body Position Statistics  Position Supine Prone Right Left Non-Supine  Sleep (min) 328.5 0.0 0.0 0.0 0.0  Sleep % 100.0 0.0 0.0 0.0 0.0  pRDI 50.3 N/A N/A N/A N/A  pAHI 50.3 N/A N/A N/A N/A  ODI 30.4 N/A N/A N/A N/A     Snoring Statistics Snoring Level (dB) >40 >50 >60 >70 >80 >Threshold (45)  Sleep (min) 78.8 2.7 0.6 0.1 0.0 9.0  Sleep % 24.0 0.8 0.2 0.0 0.0 2.7    Mean: 41 dB Sleep Stages Chart                                                                            pAHI=50.3                                                                                         Mild              Moderate                    Severe                                                    5              15                    30

## 2018-07-26 NOTE — Telephone Encounter (Signed)
I called pt, spoke to pt's wife, Santiago Glad, per DPR. I advised pt that Dr. Rexene Alberts reviewed their sleep study results and found that pt has severe osa. Dr. Rexene Alberts recommends that pt start an auto pap at home. I reviewed PAP compliance expectations with the pt's wife. Pt's wife is agreeable to pt starting an auto-PAP. I advised pt's wife that an order will be sent to a DME, [facility], and [facility] will call the pt within about one week after they file with the pt's insurance. [facility] will show the pt how to use the machine, fit for masks, and troubleshoot the auto-PAP if needed. A follow up appt was made for insurance purposes with Jinny Blossom, NP on 10/10/18 at 3:00pm. Pt's wife verbalized understanding to arrive 15 minutes early and bring their auto-PAP. A letter with all of this information in it will be sent to the pt's mychart account as a reminder. I verified with the pt's wife that the address we have on file is correct. Pt's wife verbalized understanding of results. Pt's wife had no questions at this time but was encouraged to call back if questions arise. I have sent the order to [facility] and have received confirmation that they have received the order.

## 2018-08-17 ENCOUNTER — Ambulatory Visit: Payer: Medicare Other | Admitting: Podiatry

## 2018-08-17 MED FILL — BREO ELLIPTA 200-25 MCG INH: 200-25 | 30 days supply | Qty: 60 | Fill #2

## 2018-08-17 MED FILL — ODEFSEY 200-25-25 MG TABS: 200-25-25 | 30 days supply | Qty: 30 | Fill #3

## 2018-09-06 ENCOUNTER — Other Ambulatory Visit: Payer: Self-pay

## 2018-09-06 ENCOUNTER — Ambulatory Visit: Payer: Medicare Other | Admitting: *Deleted

## 2018-09-06 VITALS — Ht 72.0 in | Wt 214.0 lb

## 2018-09-06 DIAGNOSIS — K7460 Unspecified cirrhosis of liver: Secondary | ICD-10-CM

## 2018-09-06 DIAGNOSIS — Z86010 Personal history of colonic polyps: Secondary | ICD-10-CM

## 2018-09-06 NOTE — Progress Notes (Signed)
Patient's pre-visit was done today over the phone with the patient due to COVID-19 pandemic. Name,DOB and address verified. Insurance verified. Packet of Prep instructions mailed to patient including copy of a consent form and pre-procedure patient acknowledgement form-pt is aware.  Patient understands to call us back with any questions or concerns. Patient has Golytely at home. Patient denies any allergies to eggs or soy. Patient denies any problems with anesthesia/sedation. Patient denies any oxygen use at home. Patient denies taking any diet/weight loss medications or blood thinners. EMMI education assisgned to patient on colonoscopy, this was explained and instructions given to patient. Pt is aware that care partner will wait in the car during parking lot; if they feel like they will be too hot to wait in the car; they may wait in the lobby.  We want them to wear a mask (we do not have any that we can provide them), practice social distancing, and we will check their temperatures when they get here.  I did remind patient that their care partner needs to stay in the parking lot the entire time. Pt will wear mask into building

## 2018-09-07 ENCOUNTER — Ambulatory Visit (INDEPENDENT_AMBULATORY_CARE_PROVIDER_SITE_OTHER): Payer: Medicare Other | Admitting: Family

## 2018-09-07 ENCOUNTER — Encounter: Payer: Self-pay | Admitting: Family

## 2018-09-07 ENCOUNTER — Other Ambulatory Visit (INDEPENDENT_AMBULATORY_CARE_PROVIDER_SITE_OTHER): Payer: Medicare Other

## 2018-09-07 VITALS — BP 134/78 | HR 60 | Temp 98.1°F | Ht 72.0 in | Wt 222.8 lb

## 2018-09-07 DIAGNOSIS — Z23 Encounter for immunization: Secondary | ICD-10-CM

## 2018-09-07 DIAGNOSIS — R58 Hemorrhage, not elsewhere classified: Secondary | ICD-10-CM

## 2018-09-07 DIAGNOSIS — T148XXA Other injury of unspecified body region, initial encounter: Secondary | ICD-10-CM

## 2018-09-07 LAB — CBC WITH DIFFERENTIAL/PLATELET
Basophils Absolute: 0.1 10*3/uL (ref 0.0–0.1)
Basophils Relative: 1.1 % (ref 0.0–3.0)
Eosinophils Absolute: 0.2 10*3/uL (ref 0.0–0.7)
Eosinophils Relative: 3.3 % (ref 0.0–5.0)
HCT: 46.8 % (ref 39.0–52.0)
Hemoglobin: 16.6 g/dL (ref 13.0–17.0)
Lymphocytes Relative: 27.6 % (ref 12.0–46.0)
Lymphs Abs: 1.6 10*3/uL (ref 0.7–4.0)
MCHC: 35.4 g/dL (ref 30.0–36.0)
MCV: 97.4 fl (ref 78.0–100.0)
Monocytes Absolute: 0.5 10*3/uL (ref 0.1–1.0)
Monocytes Relative: 8 % (ref 3.0–12.0)
Neutro Abs: 3.6 10*3/uL (ref 1.4–7.7)
Neutrophils Relative %: 60 % (ref 43.0–77.0)
Platelets: 111 10*3/uL — ABNORMAL LOW (ref 150.0–400.0)
RBC: 4.81 Mil/uL (ref 4.22–5.81)
RDW: 13.6 % (ref 11.5–15.5)
WBC: 6 10*3/uL (ref 4.0–10.5)

## 2018-09-07 MED ORDER — DOXYCYCLINE HYCLATE 100 MG PO TABS: 100.0000 mg | ORAL_TABLET | Freq: Two times a day (BID) | ORAL | 0 refills | Status: DC

## 2018-09-07 NOTE — Progress Notes (Signed)
Tyler Hendricks is a 68 y.o. male with the following history as recorded in Epic[employer]:  Patient Active Problem List   Diagnosis Date Noted  . Cough 06/24/2018  . Healthcare maintenance 05/11/2018  . Hx of colonic polyp 04/27/2018  . Change in bowel habit 04/27/2018  . Ventral hernia without obstruction or gangrene 04/27/2018  . Liver fibrosis 04/27/2018  . Thrombocytopenia ([place]) 04/27/2018  . Hypersplenism 04/27/2018  . Positive hepatitis C antibody test 04/27/2018  . Coronary artery disease without angina pectoris 02/25/2018  . Obstructive sleep apnea 10/25/2017  . Tinnitus 10/25/2017  . Chronic pain of both knees 08/25/2017  . Extrinsic asthma without complication 83/15/1761  . Vitamin D deficiency 08/25/2017  . HIV disease ([place]) 04/15/2017  . Primary osteoarthritis of both knees 04/15/2017  . Type 2 diabetes mellitus with hyperglycemia, without long-term current use of insulin ([place]) 04/15/2017  . Essential hypertension 04/15/2017    Current Outpatient Medications  Medication Sig Dispense Refill  . amLODipine (NORVASC) 2.5 MG tablet Take 2.5 mg by mouth daily.    Marland Kitchen aspirin 81 MG tablet Take 81 mg by mouth daily.    Marland Kitchen atenolol (TENORMIN) 50 MG tablet Take 50 mg by mouth daily.  3  . benzonatate (TESSALON) 200 MG capsule Take 1 capsule (200 mg total) by mouth 3 (three) times daily as needed for cough. 90 capsule 1  . clopidogrel (PLAVIX) 75 MG tablet TAKE 1 TABLET BY MOUTH DAILY 90 tablet 1  . fluticasone furoate-vilanterol (BREO ELLIPTA) 200-25 MCG/INH AEPB Inhale 1 puff into the lungs daily. 12 each 0  . losartan-hydrochlorothiazide (HYZAAR) 100-25 MG tablet Take 1 tablet by mouth daily.   3  . Multiple Vitamin (MULTIVITAMIN) tablet Take 1 tablet by mouth daily.    . ODEFSEY 200-25-25 MG TABS tablet TAKE 1 TABLET BY MOUTH DAILY WITH BREAKFAST. 30 tablet 4  . omega-3 acid ethyl esters (LOVAZA) 1 g capsule Take 1 g by mouth daily.     Marland Kitchen oxyCODONE-acetaminophen (PERCOCET/ROXICET)  5-325 MG tablet TK 1 T PO QID PRN    . promethazine-dextromethorphan (PROMETHAZINE-DM) 6.25-15 MG/5ML syrup Take 5 mLs by mouth 4 (four) times daily as needed for cough. 75 mL 0  . rosuvastatin (CRESTOR) 10 MG tablet Take 10 mg by mouth daily.   3  . sitaGLIPtin (JANUVIA) 100 MG tablet Take 100 mg by mouth daily.    Marland Kitchen SYNJARDY XR 12.07-998 MG TB24 TK 2 TS PO QD  3  . VASCEPA 1 g CAPS TK 2 CS PO BID  11  . doxycycline (VIBRA-TABS) 100 MG tablet Take 1 tablet (100 mg total) by mouth 2 (two) times daily. 14 tablet 0   No current facility-administered medications for this visit.     Allergies: Lopressor [metoprolol tartrate] and Integrilin [eptifibatide]  Past Medical History:  Diagnosis Date  . Abnormal result of cardiovascular function study, unspecified   . Atherosclerotic heart disease of native coronary artery without angina pectoris   . CAD (coronary artery disease)   . Coronary angioplasty status   . Diabetes mellitus without complication ([place])   . Essential (primary) hypertension   . Gallstones   . Hepatic cirrhosis ([place])   . Hepatitis C   . HIV infection ([place])   . Hyperlipidemia   . Hypertension   . Internal hemorrhoids   . Kidney stones   . Mixed hyperlipidemia   . Palpitations   . Shortness of breath   . Sleep apnea    CPAP  . Tubular adenoma of  colon     Past Surgical History:  Procedure Laterality Date  . CARDIAC CATHETERIZATION Left 04/2013  . chlecystectomy    . COLONOSCOPY  05/02/2013  . PERCUTANEOUS CORONARY STENT INTERVENTION (PCI-S)    . POLYPECTOMY    . UMBILICAL HERNIA REPAIR      Family History  Problem Relation Age of Onset  . Emphysema Mother        pulmonary  . Heart attack Father 52  . Lung cancer Sister   . Esophageal cancer Brother   . Esophageal cancer Brother   . Colon cancer Neg Hx   . Inflammatory bowel disease Neg Hx   . Liver disease Neg Hx   . Pancreatic cancer Neg Hx   . Rectal cancer Neg Hx   . Stomach cancer Neg Hx   . Colon  polyps Neg Hx     Social History   Tobacco Use  . Smoking status: Former Smoker    Types: Cigarettes    Quit date: 1994    Years since quitting: 26.4  . Smokeless tobacco: Never Used  Substance Use Topics  . Alcohol use: Yes    Frequency: Never    Comment: social-occ beer    Subjective:  Patient notes he was doing yard work at [employer] course where he works 3 days ago; was injured while cutting up a pine tree; his wife is a Therapist, sports and has applied steri-strips and been changing bandage regularly; patient is concerned that unable to get the area to stop bleeding easily; does take Plavix and aspirin due to history of CAD; has continued to take both medications;   Objective:  Vitals:   09/07/18 1001  BP: 134/78  Pulse: 60  Temp: 98.1 F (36.7 C)  TempSrc: Oral  SpO2: 94%  Weight: 222 lb 12.8 oz (101.1 kg)  Height: 6' (1.829 m)    General: Well developed, well nourished, in no acute distress  Skin : Warm and dry. Large laceration noted over right shin- steri-strips in place; active bleeding noted;  Head: Normocephalic and atraumatic  Lungs: Respirations unlabored;  Neurologic: Alert and oriented; speech intact; face symmetrical; moves all extremities well; CNII-XII intact without focal deficit  Assessment:  1. Traumatic injury to skin or subcutaneous tissue   2. Bleeding     Plan:  Update Tdap; check CBC today; Rx for Doxycycline 100 mg bid x 7 days; patient will try holding Plavix x 3 days and continue to apply pressure. Follow-up worse, no better.   No follow-ups on file.  Orders Placed This Encounter  Procedures  . Tdap vaccine greater than or equal to 7yo IM  . CBC w/Diff    Standing Status:   Future    Number of Occurrences:   1    Standing Expiration Date:   09/07/2019    Requested Prescriptions   Signed Prescriptions Disp Refills  . doxycycline (VIBRA-TABS) 100 MG tablet 14 tablet 0    Sig: Take 1 tablet (100 mg total) by mouth 2 (two) times daily.

## 2018-09-07 NOTE — Patient Instructions (Signed)
Hold your Plavix for the next 3 days- continue your Aspirin; Continue to apply pressure dressings to the wound;  We are checking your hemoglobin to make sure you have not lost too much blood.

## 2018-09-08 ENCOUNTER — Other Ambulatory Visit: Payer: Self-pay | Admitting: *Deleted

## 2018-09-08 MED ORDER — CLOPIDOGREL BISULFATE 75 MG PO TABS: 75.0000 mg | ORAL_TABLET | Freq: Every day | ORAL | 1 refills | Status: DC

## 2018-09-14 DIAGNOSIS — E119 Type 2 diabetes mellitus without complications: Secondary | ICD-10-CM | POA: Diagnosis not present

## 2018-09-14 DIAGNOSIS — H0102B Squamous blepharitis left eye, upper and lower eyelids: Secondary | ICD-10-CM | POA: Diagnosis not present

## 2018-09-14 DIAGNOSIS — H0102A Squamous blepharitis right eye, upper and lower eyelids: Secondary | ICD-10-CM | POA: Diagnosis not present

## 2018-09-14 DIAGNOSIS — H35033 Hypertensive retinopathy, bilateral: Secondary | ICD-10-CM | POA: Diagnosis not present

## 2018-09-14 DIAGNOSIS — H2513 Age-related nuclear cataract, bilateral: Secondary | ICD-10-CM | POA: Diagnosis not present

## 2018-09-14 LAB — HM DIABETES EYE EXAM

## 2018-09-15 ENCOUNTER — Encounter: Payer: Self-pay | Admitting: Internal Medicine

## 2018-09-21 MED FILL — ODEFSEY 200-25-25 MG TABS: 200-25-25 | 30 days supply | Qty: 30 | Fill #4

## 2018-09-26 ENCOUNTER — Encounter: Payer: Self-pay | Admitting: Physician Assistant

## 2018-09-26 ENCOUNTER — Encounter (INDEPENDENT_AMBULATORY_CARE_PROVIDER_SITE_OTHER): Payer: Self-pay

## 2018-09-26 ENCOUNTER — Telehealth: Payer: Medicare Other | Admitting: Physician Assistant

## 2018-09-26 DIAGNOSIS — R059 Cough, unspecified: Secondary | ICD-10-CM

## 2018-09-26 DIAGNOSIS — G4489 Other headache syndrome: Secondary | ICD-10-CM

## 2018-09-26 DIAGNOSIS — R05 Cough: Secondary | ICD-10-CM

## 2018-09-26 DIAGNOSIS — R6889 Other general symptoms and signs: Secondary | ICD-10-CM

## 2018-09-26 DIAGNOSIS — Z20822 Contact with and (suspected) exposure to covid-19: Secondary | ICD-10-CM

## 2018-09-26 MED ORDER — PREDNISONE 10 MG PO TABS: 10.0000 mg | ORAL_TABLET | Freq: Every day | ORAL | 0 refills | Status: DC

## 2018-09-26 MED ORDER — BENZONATATE 100 MG PO CAPS: 100.0000 mg | ORAL_CAPSULE | Freq: Two times a day (BID) | ORAL | 0 refills | Status: DC | PRN

## 2018-09-26 NOTE — Progress Notes (Signed)
We are sorry that you are not feeling well.  Here is how we plan to help!  Based on your presentation I believe you most likely have A cough due to bacteria.  When patients have a fever and a productive cough with a change in color or increased sputum production, we are concerned about bacterial bronchitis.  If left untreated it can progress to pneumonia.  If your symptoms do not improve with your treatment plan it is important that you contact your provider.   I have prescribed Azithromyin 250 mg: two tablets now and then one tablet daily for 4 additonal days    In addition you may use A prescription cough medication called Tessalon Perles 100mg . You may take 1-2 capsules every 8 hours as needed for your cough.  Prednisone 10 mg daily for 6 days (see taper instructions below)  From your responses in the eVisit questionnaire you describe inflammation in the upper respiratory tract which is causing a significant cough.  This is commonly called Bronchitis and has four common causes:    Allergies  Viral Infections  Acid Reflux  Bacterial Infection Allergies, viruses and acid reflux are treated by controlling symptoms or eliminating the cause. An example might be a cough caused by taking certain blood pressure medications. You stop the cough by changing the medication. Another example might be a cough caused by acid reflux. Controlling the reflux helps control the cough.  [name],  As per our conversation, you stated that the current cough started in mid June about 2.5 weeks ago. I will prescied steroids to treat bronchitis. It is advised that you follow up with your doctor if your cough continues, worsens, or if you develop any new symptoms.    USE OF BRONCHODILATOR ("RESCUE") INHALERS: There is a risk from using your bronchodilator too frequently.  The risk is that over-reliance on a medication which only relaxes the muscles surrounding the breathing tubes can reduce the effectiveness of  medications prescribed to reduce swelling and congestion of the tubes themselves.  Although you feel brief relief from the bronchodilator inhaler, your asthma may actually be worsening with the tubes becoming more swollen and filled with mucus.  This can delay other crucial treatments, such as oral steroid medications. If you need to use a bronchodilator inhaler daily, several times per day, you should discuss this with your provider.  There are probably better treatments that could be used to keep your asthma under control.     HOME CARE . Only take medications as instructed by your medical team. . Complete the entire course of an antibiotic. . Drink plenty of fluids and get plenty of rest. . Avoid close contacts especially the very young and the elderly . Cover your mouth if you cough or cough into your sleeve. . Always remember to wash your hands . A steam or ultrasonic humidifier can help congestion.   GET HELP RIGHT AWAY IF: . You develop worsening fever. . You become short of breath . You cough up blood. . Your symptoms persist after you have completed your treatment plan MAKE SURE YOU   Understand these instructions.  Will watch your condition.  Will get help right away if you are not doing well or get worse.  Your e-visit answers were reviewed by a board certified advanced clinical practitioner to complete your personal care plan.  Depending on the condition, your plan could have included both over the counter or prescription medications. If there is a problem please  reply  once you have received a response from your provider. Your safety is important to [place].  If you have drug allergies check your prescription carefully.    You can use MyChart to ask questions about today's visit, request a non-urgent call back, or ask for a work or school excuse for 24 hours related to this e-Visit. If it has been greater than 24 hours you will need to follow up with your provider, or enter a new  e-Visit to address those concerns. You will get an e-mail in the next two days asking about your experience.  I hope that your e-visit has been valuable and will speed your recovery. Thank you for using e-visits.  I spent 5-10 minutes on review and completion of this note- [name] [facility]

## 2018-09-26 NOTE — Progress Notes (Signed)
E-Visit for Corona Virus Screening   Your current symptoms could be consistent with the coronavirus.  Call your health care provider or local health department to request and arrange formal testing. Many health care providers can now test patients at their office but not all are.  Please quarantine yourself while awaiting your test results.  Tyler Hendricks [phone], [place], [place] [phone] or visit [email or web address]  and You have been enrolled in [place] for COVID-19.  Daily you will receive a questionnaire within the [place] website. Our COVID-19 response team will be monitoring your responses daily.    [name] Call the  Health Department at [phone] for appointment testing for Covid 19. I do recommend following up with your doctor for further workup of your chronic cough. Please call your doctor's office for further workup. I have prescribed Tessalon Perles for your cough.    COVID-19 is a respiratory illness with symptoms that are similar to the flu. Symptoms are typically mild to moderate, but there have been cases of severe illness and death due to the virus. The following symptoms may appear 2-14 days after exposure: . Fever . Cough . Shortness of breath or difficulty breathing . Chills . Repeated shaking with chills . Muscle pain . Headache . Sore throat . New loss of taste or smell . Fatigue . Congestion or runny nose . Nausea or vomiting . Diarrhea  It is vitally important that if you feel that you have an infection such as this virus or any other virus that you stay home and away from places where you may spread it to others.  You should self-quarantine for 14 days if you have symptoms that could potentially be coronavirus or have been in close contact a with a person diagnosed with COVID-19 within the last 2  weeks. You should avoid contact with people age 31 and older.   You should wear a mask or cloth face covering over your nose and mouth if you must be around other people or animals, including pets (even at home). Try to stay at least 6 feet away from other people. This will protect the people around you.  You can use medication such as A prescription cough medication called Tessalon Perles 100 mg. You may take 1-2 capsules every 8 hours as needed for cough  You may also take acetaminophen (Tylenol) as needed for fever.   Reduce your risk of any infection by using the same precautions used for avoiding the common cold or flu:  Marland Kitchen Wash your hands often with soap and warm water for at least 20 seconds.  If soap and water are not readily available, use an alcohol-based hand sanitizer with at least 60% alcohol.  . If coughing or sneezing, cover your mouth and nose by coughing or sneezing into the elbow areas of your shirt or coat, into a tissue or into your sleeve (not your hands). . Avoid shaking hands with others and consider head nods or verbal greetings only. . Avoid touching your eyes, nose, or mouth with unwashed hands.  . Avoid close contact with people who are sick. . Avoid places or events with large numbers of people in one location, like concerts or sporting events. . Carefully consider travel plans you have or are making. . If you are planning any travel outside or inside the [place], visit the CDC's Travelers' Health webpage for the latest health notices. . If you have some symptoms but  not all symptoms, continue to monitor at home and seek medical attention if your symptoms worsen. . If you are having a medical emergency, call 911.  HOME CARE . Only take medications as instructed by your medical team. . Drink plenty of fluids and get plenty of rest. . A steam or ultrasonic humidifier can help if you have congestion.   GET HELP RIGHT AWAY IF YOU HAVE EMERGENCY WARNING SIGNS** FOR  COVID-19. If you or someone is showing any of these signs seek emergency medical care immediately. Call 911 or proceed to your closest emergency facility if: . You develop worsening high fever. . Trouble breathing . Bluish lips or face . Persistent pain or pressure in the chest . New confusion . Inability to wake or stay awake . You cough up blood. . Your symptoms become more severe  **This list is not all possible symptoms. Contact your medical provider for any symptoms that are sever or concerning to you.   MAKE SURE YOU   Understand these instructions.  Will watch your condition.  Will get help right away if you are not doing well or get worse.  Your e-visit answers were reviewed by a board certified advanced clinical practitioner to complete your personal care plan.  Depending on the condition, your plan could have included both over the counter or prescription medications.  If there is a problem please reply once you have received a response from your provider.  Your safety is important to [place].  If you have drug allergies check your prescription carefully.    You can use MyChart to ask questions about today's visit, request a non-urgent call back, or ask for a work or school excuse for 24 hours related to this e-Visit. If it has been greater than 24 hours you will need to follow up with your provider, or enter a new e-Visit to address those concerns. You will get an e-mail in the next two days asking about your experience.  I hope that your e-visit has been valuable and will speed your recovery. Thank you for using e-visits.   I spent 5-10 minutes on review and completion of this note- [name] [facility]

## 2018-09-26 NOTE — Progress Notes (Signed)
E-Visit for Corona Virus Screening   Your current symptoms could be consistent with the coronavirus.  Call your health care provider or local health department to request and arrange formal testing. Many health care providers can now test patients at their office but not all are.  Please quarantine yourself while awaiting your test results.  [place] [phone], [place], [place] [phone] or visit [email or web address]  and You have been enrolled in [facility] for COVID-19.  Daily you will receive a questionnaire within the [facility] website. Our COVID-19 response team will be monitoring your responses daily.    COVID-19 is a respiratory illness with symptoms that are similar to the flu. Symptoms are typically mild to moderate, but there have been cases of severe illness and death due to the virus. The following symptoms may appear 2-14 days after exposure: . Fever . Cough . Shortness of breath or difficulty breathing . Chills . Repeated shaking with chills . Muscle pain . Headache . Sore throat . New loss of taste or smell . Fatigue . Congestion or runny nose . Nausea or vomiting . Diarrhea  It is vitally important that if you feel that you have an infection such as this virus or any other virus that you stay home and away from places where you may spread it to others.  You should self-quarantine for 14 days if you have symptoms that could potentially be coronavirus or have been in close contact a with a person diagnosed with COVID-19 within the last 2 weeks. You should avoid contact with people age 65 and older.   You should wear a mask or cloth face covering over your nose and mouth if you must be around other people or animals, including pets (even at home). Try to stay at least 6 feet away from other people. This will protect the  people around you.  You can use medication such as A prescription cough medication called Tessalon Perles 100 mg. You may take 1-2 capsules every 8 hours as needed for cough  You may also take acetaminophen (Tylenol) as needed for fever.   Reduce your risk of any infection by using the same precautions used for avoiding the common cold or flu:  Marland Kitchen Wash your hands often with soap and warm water for at least 20 seconds.  If soap and water are not readily available, use an alcohol-based hand sanitizer with at least 60% alcohol.  . If coughing or sneezing, cover your mouth and nose by coughing or sneezing into the elbow areas of your shirt or coat, into a tissue or into your sleeve (not your hands). . Avoid shaking hands with others and consider head nods or verbal greetings only. . Avoid touching your eyes, nose, or mouth with unwashed hands.  . Avoid close contact with people who are sick. . Avoid places or events with large numbers of people in one location, like concerts or sporting events. . Carefully consider travel plans you have or are making. . If you are planning any travel outside or inside the [place], visit the CDC's Travelers' Health webpage for the latest health notices. . If you have some symptoms but not all symptoms, continue to monitor at home and seek medical attention if your symptoms worsen. . If you are having a medical emergency, call 911.  HOME CARE . Only take medications as instructed by your medical team. . Drink plenty of fluids and get plenty of rest. .  A steam or ultrasonic humidifier can help if you have congestion.   GET HELP RIGHT AWAY IF YOU HAVE EMERGENCY WARNING SIGNS** FOR COVID-19. If you or someone is showing any of these signs seek emergency medical care immediately. Call 911 or proceed to your closest emergency facility if: . You develop worsening high fever. . Trouble breathing . Bluish lips or face . Persistent pain or pressure in the chest . New  confusion . Inability to wake or stay awake . You cough up blood. . Your symptoms become more severe  **This list is not all possible symptoms. Contact your medical provider for any symptoms that are sever or concerning to you.   MAKE SURE YOU   Understand these instructions.  Will watch your condition.  Will get help right away if you are not doing well or get worse.  Your e-visit answers were reviewed by a board certified advanced clinical practitioner to complete your personal care plan.  Depending on the condition, your plan could have included both over the counter or prescription medications.  If there is a problem please reply once you have received a response from your provider.  Your safety is important to [place].  If you have drug allergies check your prescription carefully.    You can use MyChart to ask questions about today's visit, request a non-urgent call back, or ask for a work or school excuse for 24 hours related to this e-Visit. If it has been greater than 24 hours you will need to follow up with your provider, or enter a new e-Visit to address those concerns. You will get an e-mail in the next two days asking about your experience.  I hope that your e-visit has been valuable and will speed your recovery. Thank you for using e-visits.   I spent 5-10 minutes on review and completion of this note- [name] [facility]

## 2018-09-27 ENCOUNTER — Encounter: Payer: Self-pay | Admitting: Internal Medicine

## 2018-09-27 ENCOUNTER — Encounter (INDEPENDENT_AMBULATORY_CARE_PROVIDER_SITE_OTHER): Payer: Self-pay

## 2018-09-27 ENCOUNTER — Telehealth: Payer: Self-pay | Admitting: Gastroenterology

## 2018-09-27 NOTE — Telephone Encounter (Signed)
Spoke with pts wife and procedure was rescheduled to 11/01/18@9 :30am. She is aware.

## 2018-09-27 NOTE — Telephone Encounter (Signed)
Thank you for the update. Please let the patient's wife Appreciative of trying to optimize her husband's care. I just reviewed the chart. He is also on antibiotics at this point in time as well as the prednisone. My preference would be for him to have completed all antibiotics and go through this particular route of medications before we consider doing his endoscopy and colonoscopy just because of the slight aspiration risk in trying to minimize potential complications. My preference would be to reschedule in approximately 4 weeks. Thank you. GM

## 2018-09-27 NOTE — Telephone Encounter (Signed)
Left message to call back  

## 2018-09-27 NOTE — Telephone Encounter (Signed)
Wife wanted to make sure pt is ok for procedure on 09/29/18. He has a chronic cough and has been being treated with prednisone, cough meds on and off for a while. He has no fever. Pt is being treated by [facility]. Discussed with her that Dr. Rush Landmark will review this and if there is a problem with the procedure we will call them back.

## 2018-09-27 NOTE — Telephone Encounter (Signed)
Left message for pt to call back  °

## 2018-09-29 ENCOUNTER — Encounter: Payer: Medicare Other | Admitting: Gastroenterology

## 2018-09-29 ENCOUNTER — Encounter (INDEPENDENT_AMBULATORY_CARE_PROVIDER_SITE_OTHER): Payer: Self-pay

## 2018-09-30 ENCOUNTER — Encounter (INDEPENDENT_AMBULATORY_CARE_PROVIDER_SITE_OTHER): Payer: Self-pay

## 2018-10-01 ENCOUNTER — Encounter (INDEPENDENT_AMBULATORY_CARE_PROVIDER_SITE_OTHER): Payer: Self-pay

## 2018-10-01 DIAGNOSIS — L03115 Cellulitis of right lower limb: Secondary | ICD-10-CM | POA: Diagnosis not present

## 2018-10-03 ENCOUNTER — Encounter (INDEPENDENT_AMBULATORY_CARE_PROVIDER_SITE_OTHER): Payer: Self-pay

## 2018-10-04 ENCOUNTER — Encounter (INDEPENDENT_AMBULATORY_CARE_PROVIDER_SITE_OTHER): Payer: Self-pay

## 2018-10-04 ENCOUNTER — Ambulatory Visit (INDEPENDENT_AMBULATORY_CARE_PROVIDER_SITE_OTHER): Payer: Medicare Other | Admitting: Internal Medicine

## 2018-10-04 ENCOUNTER — Other Ambulatory Visit: Payer: Self-pay

## 2018-10-04 ENCOUNTER — Encounter: Payer: Self-pay | Admitting: Internal Medicine

## 2018-10-04 DIAGNOSIS — L03115 Cellulitis of right lower limb: Secondary | ICD-10-CM | POA: Diagnosis not present

## 2018-10-04 MED ORDER — CEPHALEXIN 500 MG PO CAPS: 500.0000 mg | ORAL_CAPSULE | Freq: Three times a day (TID) | ORAL | 0 refills | Status: DC

## 2018-10-04 MED ORDER — TRAMADOL HCL 50 MG PO TABS: 50.0000 mg | ORAL_TABLET | Freq: Three times a day (TID) | ORAL | 0 refills | Status: AC | PRN

## 2018-10-04 NOTE — Patient Instructions (Signed)
We will change the antibiotic to a medicine called keflex. Take 1 pill 3 times a day for 10 days.   Call or come back in 2-3 days if redness is not getting better.   We have sent in tramadol to take up to 3 times a day as needed for the pain. The pain should go down when the redness is clearing.

## 2018-10-04 NOTE — Progress Notes (Signed)
   Subjective:   Patient ID: Tyler Hendricks, male    DOB: February 19, 1951, 68 y.o.   MRN: [id number]  HPI The patient is a 68 YO man coming in for urgent care follow up (in for right lower leg cellulitis, given bactrim on 10/01/18, has been taking regularly). He feels that the area is not improving at all. Denies fevers or chills. Redness on the right leg is not spreading but not receding at all. Some decrease in swelling but he has been propping his leg up at all times. It is still hurting 8/10 all the time. He is not taking opioids any longer and has reported to have weaned himself off this. He has tried tylenol for the pain and this did not do anything. Did have a gash on the right leg which may be where the cellulitis started. Denies any travel or long periods of incapacitation.   PMH, [facility], social history reviewed and updated  Review of Systems  Constitutional: Negative.   HENT: Negative.   Eyes: Negative.   Respiratory: Negative for cough, chest tightness and shortness of breath.   Cardiovascular: Positive for leg swelling. Negative for chest pain and palpitations.  Gastrointestinal: Negative for abdominal distention, abdominal pain, constipation, diarrhea, nausea and vomiting.  Musculoskeletal: Positive for arthralgias, gait problem and myalgias.  Skin: Positive for color change and rash.  Neurological: Negative for dizziness, tremors, weakness, light-headedness and numbness.  Psychiatric/Behavioral: Negative.     Objective:  Physical Exam Constitutional:      Appearance: He is well-developed.  HENT:     Head: Normocephalic and atraumatic.  Neck:     Musculoskeletal: Normal range of motion.  Cardiovascular:     Rate and Rhythm: Normal rate and regular rhythm.  Pulmonary:     Effort: Pulmonary effort is normal. No respiratory distress.     Breath sounds: Normal breath sounds. No wheezing or rales.  Abdominal:     General: Bowel sounds are normal. There is no distension.   Palpations: Abdomen is soft.     Tenderness: There is no abdominal tenderness. There is no rebound.  Musculoskeletal:        General: Tenderness present.     Right lower leg: Edema present.     Comments: Right leg with tight skin and swelling, red rash indicative of cellulitis on the right leg covering the foot and up to just below the knee, painful to touch and to walk on the leg, not consistent with gout.   Skin:    General: Skin is warm and dry.  Neurological:     Mental Status: He is alert and oriented to person, place, and time.     Coordination: Coordination normal.     Vitals:   10/04/18 1554  BP: 118/70  Pulse: 66  Temp: 98.1 F (36.7 C)  TempSrc: Oral  SpO2: 95%  Weight: 215 lb (97.5 kg)  Height: 6' (1.829 m)    Assessment & Plan:

## 2018-10-05 ENCOUNTER — Encounter (INDEPENDENT_AMBULATORY_CARE_PROVIDER_SITE_OTHER): Payer: Self-pay

## 2018-10-05 DIAGNOSIS — L03115 Cellulitis of right lower limb: Secondary | ICD-10-CM | POA: Insufficient documentation

## 2018-10-05 NOTE — Assessment & Plan Note (Signed)
Redness on the leg to just below the knee and on the foot. He has not seen improvement with 3 days bactrim. Will switch to keflex TID for 10 days. If no improvement in 2 days call and he may require ER or hospital for IV therapy at that time.

## 2018-10-06 ENCOUNTER — Encounter (INDEPENDENT_AMBULATORY_CARE_PROVIDER_SITE_OTHER): Payer: Self-pay

## 2018-10-06 ENCOUNTER — Ambulatory Visit: Payer: Medicare Other | Admitting: Internal Medicine

## 2018-10-07 ENCOUNTER — Encounter (INDEPENDENT_AMBULATORY_CARE_PROVIDER_SITE_OTHER): Payer: Self-pay

## 2018-10-08 ENCOUNTER — Encounter (INDEPENDENT_AMBULATORY_CARE_PROVIDER_SITE_OTHER): Payer: Self-pay

## 2018-10-09 ENCOUNTER — Encounter (INDEPENDENT_AMBULATORY_CARE_PROVIDER_SITE_OTHER): Payer: Self-pay

## 2018-10-10 ENCOUNTER — Encounter: Payer: Self-pay | Admitting: Family Medicine

## 2018-10-10 ENCOUNTER — Ambulatory Visit: Payer: Self-pay

## 2018-10-10 ENCOUNTER — Other Ambulatory Visit: Payer: Self-pay

## 2018-10-10 ENCOUNTER — Encounter: Payer: Self-pay | Admitting: Adult Health

## 2018-10-10 ENCOUNTER — Encounter: Payer: Self-pay | Admitting: Internal Medicine

## 2018-10-10 ENCOUNTER — Encounter: Payer: Medicare Other | Admitting: Adult Health

## 2018-10-10 ENCOUNTER — Ambulatory Visit (INDEPENDENT_AMBULATORY_CARE_PROVIDER_SITE_OTHER): Payer: Medicare Other | Admitting: Internal Medicine

## 2018-10-10 ENCOUNTER — Ambulatory Visit (INDEPENDENT_AMBULATORY_CARE_PROVIDER_SITE_OTHER): Payer: Medicare Other | Admitting: Family Medicine

## 2018-10-10 VITALS — BP 134/86 | HR 78 | Temp 98.1°F | Ht 72.0 in | Wt 213.0 lb

## 2018-10-10 DIAGNOSIS — G8929 Other chronic pain: Secondary | ICD-10-CM

## 2018-10-10 DIAGNOSIS — M25571 Pain in right ankle and joints of right foot: Secondary | ICD-10-CM

## 2018-10-10 DIAGNOSIS — I10 Essential (primary) hypertension: Secondary | ICD-10-CM

## 2018-10-10 DIAGNOSIS — L03115 Cellulitis of right lower limb: Secondary | ICD-10-CM

## 2018-10-10 DIAGNOSIS — L259 Unspecified contact dermatitis, unspecified cause: Secondary | ICD-10-CM | POA: Diagnosis not present

## 2018-10-10 MED ORDER — PREDNISONE 10 MG PO TABS: ORAL_TABLET | ORAL | 0 refills | Status: DC

## 2018-10-10 MED ORDER — SULFAMETHOXAZOLE-TRIMETHOPRIM 800-160 MG PO TABS: 1.0000 | ORAL_TABLET | Freq: Two times a day (BID) | ORAL | 0 refills | Status: DC

## 2018-10-10 MED ORDER — TRAMADOL HCL 50 MG PO TABS: 50.0000 mg | ORAL_TABLET | Freq: Four times a day (QID) | ORAL | 0 refills | Status: DC | PRN

## 2018-10-10 NOTE — Progress Notes (Signed)
[facility] [address], [place] [address] Phone: [phone] Subjective:   I Kandace Blitz am serving as a Education administrator for Dr. Hulan Saas.   CC: Right ankle pain  [id number]  Elwyn Klosinski is a 68 y.o. male coming in with complaint of right ankle pain. Dr. Jenny Reichmann suspects a ganglion cyst of the right ankle. States he thought it was cellulitis. Has been walking close to normal as of yesterday. Has been taking antibiotics.   Onset- acute (1 week ago) Location - joint  Character-dull, throbbing.  Is making some improvement. Aggravating factors-  Reliving factors-antibiotics Therapies tried-icing and antibiotics Severity-8 out of 10 initially but now 3 out of 10     Past Medical History:  Diagnosis Date  . Abnormal result of cardiovascular function study, unspecified   . Atherosclerotic heart disease of native coronary artery without angina pectoris   . CAD (coronary artery disease)   . Coronary angioplasty status   . Diabetes mellitus without complication ([place])   . Essential (primary) hypertension   . Gallstones   . Hepatic cirrhosis ([place])   . Hepatitis C   . HIV infection ([place])   . Hyperlipidemia   . Hypertension   . Internal hemorrhoids   . Kidney stones   . Mixed hyperlipidemia   . Palpitations   . Shortness of breath   . Sleep apnea    CPAP  . Tubular adenoma of colon    Past Surgical History:  Procedure Laterality Date  . CARDIAC CATHETERIZATION Left 04/2013  . chlecystectomy    . COLONOSCOPY  05/02/2013  . PERCUTANEOUS CORONARY STENT INTERVENTION (PCI-S)    . POLYPECTOMY    . UMBILICAL HERNIA REPAIR     Social History   Socioeconomic History  . Marital status: Married    Spouse name: Not on file  . Number of children: 1  . Years of education: Not on file  . Highest education level: 10th grade  Occupational History  . Occupation: retired  Scientific laboratory technician  . Financial resource strain: Not on file  . Food  insecurity    Worry: Never true    Inability: Never true  . Transportation needs    Medical: No    Non-medical: No  Tobacco Use  . Smoking status: Former Smoker    Types: Cigarettes    Quit date: 1994    Years since quitting: 26.5  . Smokeless tobacco: Never Used  Substance and Sexual Activity  . Alcohol use: Yes    Frequency: Never    Comment: social-occ beer  . Drug use: No  . Sexual activity: Yes  Lifestyle  . Physical activity    Days per week: Not on file    Minutes per session: Not on file  . Stress: Not on file  Relationships  . Social Herbalist on phone: Not on file    Gets together: Not on file    Attends religious service: Not on file    Active member of club or organization: Not on file    Attends meetings of clubs or organizations: Not on file    Relationship status: Not on file  Other Topics Concern  . Not on file  Social History Narrative  . Not on file   Allergies  Allergen Reactions  . Lopressor [Metoprolol Tartrate] Rash and Other (See Comments)    Bleeding (non-specific)   . Integrilin [Eptifibatide] Other (See Comments)    Bleeding (non-specific)  Family History  Problem Relation Age of Onset  . Emphysema Mother        pulmonary  . Heart attack Father 47  . Lung cancer Sister   . Esophageal cancer Brother   . Esophageal cancer Brother   . Colon cancer Neg Hx   . Inflammatory bowel disease Neg Hx   . Liver disease Neg Hx   . Pancreatic cancer Neg Hx   . Rectal cancer Neg Hx   . Stomach cancer Neg Hx   . Colon polyps Neg Hx     Current Outpatient Medications (Endocrine & Metabolic):  .  predniSONE (DELTASONE) 10 MG tablet, 3 tabs by mouth per day for 3 days,2tabs per day for 3 days,1tab per day for 3 days .  sitaGLIPtin (JANUVIA) 100 MG tablet, Take 100 mg by mouth daily. Marland Kitchen  SYNJARDY XR 12.07-998 MG TB24, TK 2 TS PO QD  Current Outpatient Medications (Cardiovascular):  .  amLODipine (NORVASC) 2.5 MG tablet, Take 2.5 mg by  mouth daily. Marland Kitchen  atenolol (TENORMIN) 50 MG tablet, Take 50 mg by mouth daily. Marland Kitchen  losartan-hydrochlorothiazide (HYZAAR) 100-25 MG tablet, Take 1 tablet by mouth daily.  Marland Kitchen  omega-3 acid ethyl esters (LOVAZA) 1 g capsule, Take 1 g by mouth daily.  .  rosuvastatin (CRESTOR) 10 MG tablet, Take 10 mg by mouth daily.  Marland Kitchen  VASCEPA 1 g CAPS, TK 2 CS PO BID  Current Outpatient Medications (Respiratory):  .  benzonatate (TESSALON) 100 MG capsule, Take 1 capsule (100 mg total) by mouth 2 (two) times daily as needed for cough. .  benzonatate (TESSALON) 200 MG capsule, Take 1 capsule (200 mg total) by mouth 3 (three) times daily as needed for cough. .  fluticasone furoate-vilanterol (BREO ELLIPTA) 200-25 MCG/INH AEPB, Inhale 1 puff into the lungs daily. .  promethazine-dextromethorphan (PROMETHAZINE-DM) 6.25-15 MG/5ML syrup, Take 5 mLs by mouth 4 (four) times daily as needed for cough.  Current Outpatient Medications (Analgesics):  .  aspirin 81 MG tablet, Take 81 mg by mouth daily. .  traMADol (ULTRAM) 50 MG tablet, Take 1 tablet (50 mg total) by mouth every 6 (six) hours as needed.  Current Outpatient Medications (Hematological):  .  clopidogrel (PLAVIX) 75 MG tablet, Take 1 tablet (75 mg total) by mouth daily.  Current Outpatient Medications (Other):  Marland Kitchen  Multiple Vitamin (MULTIVITAMIN) tablet, Take 1 tablet by mouth daily. .  ODEFSEY 200-25-25 MG TABS tablet, TAKE 1 TABLET BY MOUTH DAILY WITH BREAKFAST. Marland Kitchen  sulfamethoxazole-trimethoprim (BACTRIM DS) 800-160 MG tablet, Take 1 tablet by mouth 2 (two) times daily.    Past medical history, social, surgical and family history all reviewed in electronic medical record.  No pertanent information unless stated regarding to the chief complaint.   Review of Systems:  No headache, visual changes, nausea, vomiting, diarrhea, constipation, dizziness, abdominal pain, skin rash, fevers, chills, night sweats, weight loss, swollen lymph nodes, body aches, joint  swelling, muscle aches, chest pain, shortness of breath, mood changes.   Objective  There were no vitals taken for this visit. Systems examined below as of    General: No apparent distress alert and oriented x3 mood and affect normal, dressed appropriately.  HEENT: Pupils equal, extraocular movements intact  Respiratory: Patient's speak in full sentences and does not appear short of breath  Cardiovascular: 2+ lower extremity edema, right ankle does have some erythema noted and is tender to palpation compared to the left Skin: Warm dry intact with no signs of infection or  rash on extremities or on axial skeleton.  Abdomen: Soft nontender  Neuro: Cranial nerves II through XII are intact, neurovascularly intact in all extremities with 2+ DTRs and 2+ pulses.  Lymph: No lymphadenopathy of posterior or anterior cervical chain or axillae bilaterally.  Gait antalgic MSK:  tender with full range of motion and good stability and symmetric strength and tone of shoulders, elbows, wrist, hip, knee bilaterally.  Mild to moderate arthritic changes of multiple joints Right ankle has erythema noted.  Decreased range of motion in all planes.  Mild pain over the navicular bone.  Patient is dorsalis pedis pulses noted.  Limited musculoskeletal ultrasound was performed and interpreted by Lyndal Pulley  Patient's limited neck ultrasound shows the patient does have moderate arthritic changes of the ankle as well as the midfoot.  Patient's navicular bone has what appears to be a old fracture noted.  Nondisplaced. Impression: Cellulitis underlying arthritis   Impression and Recommendations:     This case required medical decision making of moderate complexity. The above documentation has been reviewed and is accurate and complete Lyndal Pulley, DO       Note: This dictation was prepared with Dragon dictation along with smaller phrase technology. Any transcriptional errors that result from this process are  unintentional.

## 2018-10-10 NOTE — Patient Instructions (Addendum)
The "differential" for your problem includes cellulitis vs ganglion cyst  OK to stop the cephalexin antibiotic  Please take all new medication as prescribed - the septra DS, and the prednisone  Please continue all other medications as before, including the tramadol as needed for pain  Please have the pharmacy call with any other refills you may need.  Please keep your appointments with your specialists as you may have planned  You will be contacted regarding the referral for: Sport Medicine if you are not able to be seen today

## 2018-10-10 NOTE — Progress Notes (Signed)
Subjective:    Patient ID: Tyler Hendricks, male    DOB: June 11, 1950, 68 y.o.   MRN: [id number]  HPI  Here with c/o > 1 wk primarily right ankle medial and anterior pain and swelling (with lesser swelling to foot and mid calf or slightly higher), sharp, mod to severe, worse to sit too long, better after walking a bit, without fever, trauma, hx of gout.  [place] he works part time in his retirement.  Wife had him call last wk, and has been cephalexin per PCP but not improved pain and swelling.  On plavix for mult cardiac stents, no hx of DVT. Does not have unstable ankle or falls.  Pt denies chest pain, increased sob or doe, wheezing, orthopnea, PND, increased LE swelling, palpitations, dizziness or syncope.  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 Abnormal result of cardiovascular function study, unspecified   . Atherosclerotic heart disease of native coronary artery without angina pectoris   . CAD (coronary artery disease)   . Coronary angioplasty status   . Diabetes mellitus without complication ([place])   . Essential (primary) hypertension   . Gallstones   . Hepatic cirrhosis ([place])   . Hepatitis C   . HIV infection ([place])   . Hyperlipidemia   . Hypertension   . Internal hemorrhoids   . Kidney stones   . Mixed hyperlipidemia   . Palpitations   . Shortness of breath   . Sleep apnea    CPAP  . Tubular adenoma of colon    Past Surgical History:  Procedure Laterality Date  . CARDIAC CATHETERIZATION Left 04/2013  . chlecystectomy    . COLONOSCOPY  05/02/2013  . PERCUTANEOUS CORONARY STENT INTERVENTION (PCI-S)    . POLYPECTOMY    . UMBILICAL  HERNIA REPAIR      reports that he quit smoking about 26 years ago. His smoking use included cigarettes. He has never used smokeless tobacco. He reports current alcohol use. He reports that he does not use drugs. family history includes Emphysema in his mother; Esophageal cancer in his brother and brother; Heart attack (age of onset: 42) in his father; Lung cancer in his sister. Allergies  Allergen Reactions  . Lopressor [Metoprolol Tartrate] Rash and Other (See Comments)    Bleeding (non-specific)   . Integrilin [Eptifibatide] Other (See Comments)    Bleeding (non-specific)   Current Outpatient Medications on File Prior to Visit  Medication Sig Dispense Refill  . amLODipine (NORVASC) 2.5 MG tablet Take 2.5 mg by mouth daily.    Marland Kitchen aspirin 81 MG tablet Take 81 mg by mouth daily.    Marland Kitchen atenolol (TENORMIN) 50 MG tablet Take 50 mg by mouth daily.  3  . benzonatate (TESSALON) 100 MG capsule Take 1 capsule (100 mg total) by mouth 2 (two) times daily as needed for cough. 20 capsule 0  . benzonatate (TESSALON) 200 MG capsule Take 1 capsule (200 mg total) by mouth 3 (three) times daily as needed for cough. 90 capsule 1  . cephALEXin (KEFLEX) 500 MG capsule Take 1 capsule (500 mg total) by mouth 3 (three)  times daily. 30 capsule 0  . clopidogrel (PLAVIX) 75 MG tablet Take 1 tablet (75 mg total) by mouth daily. 90 tablet 1  . fluticasone furoate-vilanterol (BREO ELLIPTA) 200-25 MCG/INH AEPB Inhale 1 puff into the lungs daily. 12 each 0  . losartan-hydrochlorothiazide (HYZAAR) 100-25 MG tablet Take 1 tablet by mouth daily.   3  . Multiple Vitamin (MULTIVITAMIN) tablet Take 1 tablet by mouth daily.    . ODEFSEY 200-25-25 MG TABS tablet TAKE 1 TABLET BY MOUTH DAILY WITH BREAKFAST. 30 tablet 4  . omega-3 acid ethyl esters (LOVAZA) 1 g capsule Take 1 g by mouth daily.     Marland Kitchen oxyCODONE-acetaminophen (PERCOCET/ROXICET) 5-325 MG tablet TK 1 T PO QID PRN    . predniSONE (DELTASONE) 10 MG tablet Take 1 tablet (10  mg total) by mouth daily with breakfast. Take 6 pills today, decrease by one pill daily until finished. 21 tablet 0  . promethazine-dextromethorphan (PROMETHAZINE-DM) 6.25-15 MG/5ML syrup Take 5 mLs by mouth 4 (four) times daily as needed for cough. 75 mL 0  . rosuvastatin (CRESTOR) 10 MG tablet Take 10 mg by mouth daily.   3  . sitaGLIPtin (JANUVIA) 100 MG tablet Take 100 mg by mouth daily.    Marland Kitchen SYNJARDY XR 12.07-998 MG TB24 TK 2 TS PO QD  3  . VASCEPA 1 g CAPS TK 2 CS PO BID  11   No current facility-administered medications on file prior to visit.    Review of Systems  Constitutional: Negative for other unusual diaphoresis or sweats HENT: Negative for ear discharge or swelling Eyes: Negative for other worsening visual disturbances Respiratory: Negative for stridor or other swelling  Gastrointestinal: Negative for worsening distension or other blood Genitourinary: Negative for retention or other urinary change Musculoskeletal: Negative for other MSK pain or swelling Skin: Negative for color change or other new lesions Neurological: Negative for worsening tremors and other numbness  Psychiatric/Behavioral: Negative for worsening agitation or other fatigue All other system neg per pt    Objective:   Physical Exam BP 134/86   Pulse 78   Temp 98.1 F (36.7 C) (Oral)   Ht 6' (1.829 m)   Wt 213 lb (96.6 kg)   SpO2 95%   BMI 28.89 kg/m  VS noted,  Constitutional: Pt appears in NAD HENT: Head: NCAT.  Right Ear: External ear normal.  Left Ear: External ear normal.  Eyes: . Pupils are equal, round, and reactive to light. Conjunctivae and EOM are normal Nose: without d/c or deformity Neck: Neck supple. Gross normal ROM Cardiovascular: Normal rate and regular rhythm.   Pulmonary/Chest: Effort normal and breath sounds without rales or wheezing.  RLE with primarily medial warmth, non discrete erythema , swelling and mild to mod tender without ulcer, red streaks or drainage;swelling is  present in lesser fashion to right foot and also distal leg to above mid calf, but no calf tender or cords; dorsalis pedis 1+ bilat There is also typical contact derm type "lines" of rash to the left ant forearm Neurological: Pt is alert. At baseline orientation, motor grossly intact Skin: Skin is warm. No rashes, other new lesions, no LE edema Psychiatric: Pt behavior is normal without agitation  No other exam findings Lab Results  Component Value Date   WBC 6.0 09/07/2018   HGB 16.6 09/07/2018   HCT 46.8 09/07/2018   PLT 111.0 (L) 09/07/2018   GLUCOSE 137 (H) 04/27/2018   CHOL 147 04/27/2018   TRIG 61 04/27/2018   HDL  85 04/27/2018   LDLCALC 48 04/27/2018   ALT 30 04/27/2018   AST 34 04/27/2018   NA 146 04/27/2018   K 4.5 04/27/2018   CL 109 04/27/2018   CREATININE 0.70 04/27/2018   BUN 19 04/27/2018   CO2 28 04/27/2018   PSA 0.12 02/25/2018   HGBA1C 6.2 02/25/2018       Assessment & Plan:

## 2018-10-10 NOTE — Assessment & Plan Note (Signed)
Ok for predpac asd, suspect may help the right ankle as well, to f/u any worsening symptoms or concerns

## 2018-10-10 NOTE — Assessment & Plan Note (Signed)
Etiology unclear, I suspect ganglion cyst vs cellulitis, will change to septra ds course, tramadol prn, but also try to have sports medicine see him as well

## 2018-10-10 NOTE — Assessment & Plan Note (Signed)
stable overall by history and exam, recent data reviewed with pt, and pt to continue medical treatment as before,  to f/u any worsening symptoms or concerns  

## 2018-10-10 NOTE — Assessment & Plan Note (Signed)
Patient has signs and symptoms more consistent with a cellulitis that is not completely resolved at this time.  We discussed with patient, but also peripheral vascular disease is within the differential.  Do not see any type of cyst formation noted at the moment.  Mild underlying arthritis.  Discussed proper shoes, compression socks, topical anti-inflammatories and icing regimen.  Encourage patient to take the Bactrim and finished that in the near future.  Follow-up again 4 to 8 weeks

## 2018-10-10 NOTE — Patient Instructions (Signed)
Good to see you Mild cellulitis and arthritis of the foot 4,000-5,000 Vitamin D Ice 20 mins 2 times daily  Aircast for the ankle Spenco orthotics "total support" See me again in 4 weeks

## 2018-10-13 ENCOUNTER — Ambulatory Visit (INDEPENDENT_AMBULATORY_CARE_PROVIDER_SITE_OTHER): Payer: Medicare Other | Admitting: Adult Health

## 2018-10-13 ENCOUNTER — Encounter: Payer: Self-pay | Admitting: Adult Health

## 2018-10-13 ENCOUNTER — Other Ambulatory Visit: Payer: Self-pay

## 2018-10-13 VITALS — BP 109/60 | HR 61 | Temp 97.8°F | Ht 72.0 in | Wt 215.0 lb

## 2018-10-13 DIAGNOSIS — Z9989 Dependence on other enabling machines and devices: Secondary | ICD-10-CM | POA: Diagnosis not present

## 2018-10-13 DIAGNOSIS — G4733 Obstructive sleep apnea (adult) (pediatric): Secondary | ICD-10-CM

## 2018-10-13 NOTE — Patient Instructions (Signed)

## 2018-10-13 NOTE — Progress Notes (Addendum)
PATIENT: Tyler Hendricks DOB: 25-Aug-1950  REASON FOR VISIT: follow up HISTORY FROM: patient  HISTORY OF PRESENT ILLNESS: Today 10/13/18: [name] is a 68 year old male with a history of obstructive sleep apnea on CPAP.  He returns today for follow-up.  His download indicates that he use his machine 23 out of 30 days for compliance of 77%.  He uses machine greater than 4 hours 11 days for compliance of 37%.  On average he uses his machine 3 hours and 56 minutes.  His residual AHI is 4.2 on 7 to 15 cm of water with EPR of 2.  His leak in the 95th percentile is 9.8.  He reports that some nights he does not get more than 4 hours of sleep.  He states that he is getting more used to the mask.  His Epworth sleepiness score is 2 and fatigue severity score is 16.  He returns today for follow-up.   HISTORY Mr. Herron Fero is a 68 year old right-handed gentleman with an underlying medical history of hypertension, hyperlipidemia, HIV, history of hepatitis C, liver cirrhosis, history of gallstones, diabetes, coronary artery disease with history of coronary angioplasty, history of kidney stones and obesity, with whom I am conducting a virtual, video based new patient visit via Webex in lieu of a face-to-face visit for evaluation of his sleep disorder, in particular, reevaluation of his obstructive sleep apnea to establish care. The patient is unaccompanied today (wife in the background) and joins via laptop from home. He is referred by his ID NP, Mauricio Po and I reviewed his office note from 05/11/2018. He recently saw Dr. Sharlet Salina for a virtual visit on 06/24/2018 for a one-month history of cough. I reviewed the note. His Epworth sleepiness score is 11 out of 24, fatigue severity score is 33 out of 63.  He had sleep study testing out of state, I was able to review a PSG report from 09/14/2015 which was done in [place], [place]. Was 234 pounds at the time for a BMI of 30.9. Sleep efficiency was  79.4%, sleep latency 11 minutes, REM latency 33.5 minutes. AHI was 14 per hour REM AHI was 44.5 per hour. The study was apparently conducted with an oral appliance in place and he was noted to have persistent sleep apnea with oral appliance in place and prominent PVCs were noted. Was advised to follow-up with cardiology. He had a CPAP titration study at the same location on 06/27/2013 he was titrated from CPAP of 4 cm to 15 cm and then on BiPAP of 15/11 and 17/13 cm. Frequent PVCs were seen. Patient was reported to tolerate CPAP well and CPAP of 12 cm was recommended. He had a baseline sleep study on 06/12/2013 at the same location which demonstrated an AHI of 12.5 per hour, REM related AHI of 39.3 per hour. O2 nadir appeared to be 79%. A CPAP compliance download is not available for my review.he reports that he has a chip in the machine. We could try to get access to a download later on. He has had trouble tolerating the pressure and the mass, has been using a full facemask most recently, machine is almost 68 years old. In the beginning he felt some improvement, was less tired and started off with a nasal mask but was a mouth breather and switched over to a full facemask. He would be willing to get back on a CPAP or AutoPap and would be willing to get repeat evaluated. He has been trying  to lose weight to some degree. He retired in 2011, was a heavy Company secretary. Current bedtime around 10 and rise time between 4 and 5. Lives with his wife, has a biological daughter in this area, moved to [place] about a year ago in March. Has stepchildren in [place]. He has no family history of OSA. He quit smoking in 1999. He has a dog in the household, watches TV in his bedroom until it is time to sleep, does like to read before sleeping. He denies morning headaches but has significant nocturia about 3 times per average night. His cough has been a little better since he took the cough medicine. He reports being  up-to-date for all vaccines and he was treated for hep C in the past. He has seen a liver specialist.   REVIEW OF SYSTEMS: Out of a complete 14 system review of symptoms, the patient complains only of the following symptoms, and all other reviewed systems are negative.  See HPI  ALLERGIES: Allergies  Allergen Reactions   Lopressor [Metoprolol Tartrate] Rash and Other (See Comments)    Bleeding (non-specific)    Integrilin [Eptifibatide] Other (See Comments)    Bleeding (non-specific)    HOME MEDICATIONS: Outpatient Medications Prior to Visit  Medication Sig Dispense Refill   amLODipine (NORVASC) 2.5 MG tablet Take 2.5 mg by mouth daily.     aspirin 81 MG tablet Take 81 mg by mouth daily.     atenolol (TENORMIN) 50 MG tablet Take 50 mg by mouth daily.  3   benzonatate (TESSALON) 100 MG capsule Take 1 capsule (100 mg total) by mouth 2 (two) times daily as needed for cough. 20 capsule 0   benzonatate (TESSALON) 200 MG capsule Take 1 capsule (200 mg total) by mouth 3 (three) times daily as needed for cough. 90 capsule 1   clopidogrel (PLAVIX) 75 MG tablet Take 1 tablet (75 mg total) by mouth daily. 90 tablet 1   fluticasone furoate-vilanterol (BREO ELLIPTA) 200-25 MCG/INH AEPB Inhale 1 puff into the lungs daily. 12 each 0   losartan-hydrochlorothiazide (HYZAAR) 100-25 MG tablet Take 1 tablet by mouth daily.   3   Multiple Vitamin (MULTIVITAMIN) tablet Take 1 tablet by mouth daily.     ODEFSEY 200-25-25 MG TABS tablet TAKE 1 TABLET BY MOUTH DAILY WITH BREAKFAST. 30 tablet 4   omega-3 acid ethyl esters (LOVAZA) 1 g capsule Take 1 g by mouth daily.      predniSONE (DELTASONE) 10 MG tablet 3 tabs by mouth per day for 3 days,2tabs per day for 3 days,1tab per day for 3 days 18 tablet 0   promethazine-dextromethorphan (PROMETHAZINE-DM) 6.25-15 MG/5ML syrup Take 5 mLs by mouth 4 (four) times daily as needed for cough. 75 mL 0   rosuvastatin (CRESTOR) 10 MG tablet Take 10 mg by  mouth daily.   3   sitaGLIPtin (JANUVIA) 100 MG tablet Take 100 mg by mouth daily.     sulfamethoxazole-trimethoprim (BACTRIM DS) 800-160 MG tablet Take 1 tablet by mouth 2 (two) times daily. 20 tablet 0   SYNJARDY XR 12.07-998 MG TB24 TK 2 TS PO QD  3   traMADol (ULTRAM) 50 MG tablet Take 1 tablet (50 mg total) by mouth every 6 (six) hours as needed. 30 tablet 0   VASCEPA 1 g CAPS TK 2 CS PO BID  11   No facility-administered medications prior to visit.     PAST MEDICAL HISTORY: Past Medical History:  Diagnosis Date   Abnormal  result of cardiovascular function study, unspecified    Atherosclerotic heart disease of native coronary artery without angina pectoris    CAD (coronary artery disease)    Coronary angioplasty status    Diabetes mellitus without complication ([place])    Essential (primary) hypertension    Gallstones    Hepatic cirrhosis ([facility])    Hepatitis C    HIV infection ([place])    Hyperlipidemia    Hypertension    Internal hemorrhoids    Kidney stones    Mixed hyperlipidemia    Palpitations    Shortness of breath    Sleep apnea    CPAP   Tubular adenoma of colon     PAST SURGICAL HISTORY: Past Surgical History:  Procedure Laterality Date   CARDIAC CATHETERIZATION Left 04/2013   chlecystectomy     COLONOSCOPY  05/02/2013   PERCUTANEOUS CORONARY STENT INTERVENTION (PCI-S)     POLYPECTOMY     UMBILICAL HERNIA REPAIR      FAMILY HISTORY: Family History  Problem Relation Age of Onset   Emphysema Mother        pulmonary   Heart attack Father 22   Lung cancer Sister    Esophageal cancer Brother    Esophageal cancer Brother    Colon cancer Neg Hx    Inflammatory bowel disease Neg Hx    Liver disease Neg Hx    Pancreatic cancer Neg Hx    Rectal cancer Neg Hx    Stomach cancer Neg Hx    Colon polyps Neg Hx     SOCIAL HISTORY: Social History   Socioeconomic History   Marital status: Married    Spouse name: Not  on file   Number of children: 1   Years of education: Not on file   Highest education level: 10th grade  Occupational History   Occupation: retired  Scientist, 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    Worry: Never true    Inability: Never true   Transportation needs    Medical: No    Non-medical: No  Tobacco Use   Smoking status: Former Smoker    Types: Cigarettes    Quit date: 1994    Years since quitting: 26.5   Smokeless tobacco: Never Used  Substance and Sexual Activity   Alcohol use: Yes    Frequency: Never    Comment: social-occ beer   Drug use: No   Sexual activity: Yes  Lifestyle   Physical activity    Days per week: Not on file    Minutes per session: Not on file   Stress: Not on file  Relationships   Social connections    Talks on phone: Not on file    Gets together: Not on file    Attends religious service: Not on file    Active member of club or organization: Not on file    Attends meetings of clubs or organizations: Not on file    Relationship status: Not on file   Intimate partner violence    Fear of current or ex partner: No    Emotionally abused: No    Physically abused: No    Forced sexual activity: No  Other Topics Concern   Not on file  Social History Narrative   Not on file      PHYSICAL EXAM  Vitals:   10/13/18 1019  BP: 109/60  Pulse: 61  Temp: 97.8 F (36.6 C)  Weight: 215 lb (97.5 kg)  Height: 6' (1.829 m)   Body mass index is 29.16 kg/m.  Generalized: Well developed, in no acute distress  Chest: Lungs clear to auscultation bilaterally  Neurological examination  Mentation: Alert oriented to time, place, history taking. Follows all commands speech and language fluent Cranial nerve II-XII: Pupils were equal round reactive to light.  Facial sensation and strength were normal. Uvula tongue midline. Head turning and shoulder shrug  were normal and symmetric. Motor: The motor testing  reveals 5 over 5 strength of all 4 extremities. Good symmetric motor tone is noted throughout.  Sensory: Sensory testing is intact to soft touch on all 4 extremities. No evidence of extinction is noted.  Coordination: Cerebellar testing reveals good finger-nose-finger and heel-to-shin bilaterally.  Gait and station: Gait is normal.   DIAGNOSTIC DATA (LABS, IMAGING, TESTING) - I reviewed patient records, labs, notes, testing and imaging myself where available.  Lab Results  Component Value Date   WBC 6.0 09/07/2018   HGB 16.6 09/07/2018   HCT 46.8 09/07/2018   MCV 97.4 09/07/2018   PLT 111.0 (L) 09/07/2018      Component Value Date/Time   NA 146 04/27/2018 0857   K 4.5 04/27/2018 0857   CL 109 04/27/2018 0857   CO2 28 04/27/2018 0857   GLUCOSE 137 (H) 04/27/2018 0857   BUN 19 04/27/2018 0857   CREATININE 0.70 04/27/2018 0857   CALCIUM 9.4 04/27/2018 0857   PROT 6.5 04/27/2018 0857   ALBUMIN 4.0 08/25/2017 1448   AST 34 04/27/2018 0857   ALT 30 04/27/2018 0857   ALKPHOS 111 08/25/2017 1448   BILITOT 2.5 (H) 04/27/2018 0857   GFRNONAA 98 04/02/2017 1058   GFRAA 114 04/02/2017 1058   Lab Results  Component Value Date   CHOL 147 04/27/2018   HDL 85 04/27/2018   LDLCALC 48 04/27/2018   TRIG 61 04/27/2018   CHOLHDL 1.7 04/27/2018   Lab Results  Component Value Date   HGBA1C 6.2 02/25/2018   Lab Results  Component Value Date   VITAMINB12 536 08/25/2017   No results found for: TSH    ASSESSMENT AND PLAN 68 y.o. year old male  has a past medical history of Abnormal result of cardiovascular function study, unspecified, Atherosclerotic heart disease of native coronary artery without angina pectoris, CAD (coronary artery disease), Coronary angioplasty status, Diabetes mellitus without complication ([place]), Essential (primary) hypertension, Gallstones, Hepatic cirrhosis ([place]), Hepatitis C, HIV infection ([place]), Hyperlipidemia, Hypertension, Internal hemorrhoids, Kidney stones,  Mixed hyperlipidemia, Palpitations, Shortness of breath, Sleep apnea, and Tubular adenoma of colon. here with:  1.  Obstructive sleep apnea on CPAP  The patient CPAP download shows suboptimal compliance with good treatment of his apnea.  He is encouraged to try to use machine nightly and greater than 4 hours each night.  He is advised that if his symptoms worsen or he develops new symptoms he should let us know.  He will follow-up in 6 months or sooner if needed.   I spent 15 minutes with the patient. 50% of this time was spent reviewing CPAP download   Ward Givens, MSN, NP-C 10/13/2018, 10:54 AM [facility] [address], [place], [place] [address] [phone]  I reviewed the above note and documentation by the Nurse Practitioner and agree with the history, physical exam, assessment and plan as outlined above. I was immediately available for consultation. Star Age, MD, PhD [facility])

## 2018-10-26 ENCOUNTER — Other Ambulatory Visit: Payer: Self-pay | Admitting: Family

## 2018-10-26 DIAGNOSIS — B20 Human immunodeficiency virus [HIV] disease: Secondary | ICD-10-CM

## 2018-10-27 MED FILL — ODEFSEY 200-25-25 MG TABS: 200-25-25 | 30 days supply | Qty: 30 | Fill #0

## 2018-10-31 ENCOUNTER — Telehealth: Payer: Self-pay | Admitting: Gastroenterology

## 2018-10-31 NOTE — Telephone Encounter (Signed)
Pt returned call and answered “No” to all questions.  °  °Pt made aware of that care partner may come to the lobby during the procedure but will need to provide their own mask. ° ° °

## 2018-10-31 NOTE — Telephone Encounter (Signed)

## 2018-11-01 ENCOUNTER — Ambulatory Visit (AMBULATORY_SURGERY_CENTER): Payer: Medicare Other | Admitting: Gastroenterology

## 2018-11-01 ENCOUNTER — Other Ambulatory Visit: Payer: Self-pay

## 2018-11-01 ENCOUNTER — Encounter: Payer: Self-pay | Admitting: Gastroenterology

## 2018-11-01 VITALS — BP 148/90 | HR 70 | Temp 98.8°F | Resp 13 | Ht 72.0 in | Wt 214.0 lb

## 2018-11-01 DIAGNOSIS — K3189 Other diseases of stomach and duodenum: Secondary | ICD-10-CM | POA: Diagnosis not present

## 2018-11-01 DIAGNOSIS — K7460 Unspecified cirrhosis of liver: Secondary | ICD-10-CM

## 2018-11-01 DIAGNOSIS — D122 Benign neoplasm of ascending colon: Secondary | ICD-10-CM

## 2018-11-01 DIAGNOSIS — D124 Benign neoplasm of descending colon: Secondary | ICD-10-CM

## 2018-11-01 DIAGNOSIS — K2970 Gastritis, unspecified, without bleeding: Secondary | ICD-10-CM

## 2018-11-01 DIAGNOSIS — I8510 Secondary esophageal varices without bleeding: Secondary | ICD-10-CM

## 2018-11-01 DIAGNOSIS — K2950 Unspecified chronic gastritis without bleeding: Secondary | ICD-10-CM | POA: Diagnosis not present

## 2018-11-01 DIAGNOSIS — Z86010 Personal history of colonic polyps: Secondary | ICD-10-CM | POA: Diagnosis not present

## 2018-11-01 DIAGNOSIS — D123 Benign neoplasm of transverse colon: Secondary | ICD-10-CM

## 2018-11-01 MED ORDER — OMEPRAZOLE 40 MG PO CPDR: 40.0000 mg | DELAYED_RELEASE_CAPSULE | Freq: Every day | ORAL | 3 refills | Status: DC

## 2018-11-01 MED ORDER — SODIUM CHLORIDE 0.9 % IV SOLN: 500.0000 mL | Freq: Once | INTRAVENOUS | Status: DC

## 2018-11-01 NOTE — Op Note (Signed)
[facility] Patient Name: Tyler Hendricks Procedure Date: 11/01/2018 10:23 AM MRN: [id number] Endoscopist: Justice Britain , MD Age: 68 Referring MD:  Date of Birth: 05/10/1950 Gender: Male Account #: [id number] Procedure:                Upper GI endoscopy Indications:              Portal hypertension with suspected esophageal                            varices Medicines:                Monitored Anesthesia Care Procedure:                Pre-Anesthesia Assessment:                           - Prior to the procedure, a History and Physical                            was performed, and patient medications and                            allergies were reviewed. The patient's tolerance of                            previous anesthesia was also reviewed. The risks                            and benefits of the procedure and the sedation                            options and risks were discussed with the patient.                            All questions were answered, and informed consent                            was obtained. Prior Anticoagulants: The patient has                            taken Plavix (clopidogrel), last dose was 5 days                            prior to procedure. ASA Grade Assessment: III - A                            patient with severe systemic disease. After                            reviewing the risks and benefits, the patient was                            deemed in satisfactory condition to undergo the  procedure.                           After obtaining informed consent, the endoscope was                            passed under direct vision. Throughout the                            procedure, the patient's blood pressure, pulse, and                            oxygen saturations were monitored continuously. The                            Endoscope was introduced through the mouth, and                             advanced to the second part of duodenum. The upper                            GI endoscopy was accomplished without difficulty.                            The patient tolerated the procedure. Scope In: Scope Out: Findings:                 No gross lesions were noted in the proximal                            esophagus and in the mid esophagus.                           Grade I varices were found in the distal esophagus.                           Diffuse moderate mucosal changes characterized by                            congestion, erythema, friability (with contact                            bleeding), nodularity and altered texture were                            found in the cardia, in the gastric fundus and in                            the gastric body. May be all consistent with portal                            gastropathy, however there was some atrophic                            appearance to the  mucosa as well.                           Normal mucosa was found in the gastric antrum.                           Biopsies were taken with a cold forceps in the                            cardia, in the gastric body, at the incisura and in                            the gastric antrum for histology and Helicobacter                            pylori testing.                           Patchy mildly erythematous mucosa without active                            bleeding was found in the duodenal bulb, in the                            D1/D2 sweep.                           No gross lesions were noted in the second portion                            of the duodenum. Complications:            No immediate complications. Estimated Blood Loss:     Estimated blood loss was minimal. Impression:               - No gross lesions in proximal/middle esophagus.                            Grade I esophageal varices distally.                           - Congested, erythematous, friable (with contact                             bleeding), nodular and texture changed mucosa in                            the cardia, gastric fundus and gastric body. Normal                            mucosa was found in the antrum. Biopsied for HP.                           - Erythematous duodenopathy in bulb and D1/D2 sweep.                           -  No gross lesions in the second portion of the                            duodenum. Recommendation:           - Proceed to scheduled colonoscopy.                           - Await pathology results.                           - Initiate Omeprazole 40 mg daily.                           - Patient on Atenolol currently, but may discuss in                            future with Cardiology/PCP team consideration of                            Carvedilol transition for primary prevention of                            variceal bleeding in future.                           - Repeat EGD in 3-years for surveillance.                           - The findings and recommendations were discussed                            with the patient. Justice Britain, MD 11/01/2018 11:22:17 AM

## 2018-11-01 NOTE — Progress Notes (Signed)
Pt's states no medical or surgical changes since previsit or office visit. 

## 2018-11-01 NOTE — Op Note (Signed)
[facility] Patient Name: Tyler Hendricks Procedure Date: 11/01/2018 10:23 AM MRN: [id number] Endoscopist: Justice Britain , MD Age: 68 Referring MD:  Date of Birth: 1951/02/02 Gender: Male Account #: [id number] Procedure:                Colonoscopy Indications:              High risk colon cancer surveillance: Personal                            history of colonic polyps Medicines:                Monitored Anesthesia Care Procedure:                Pre-Anesthesia Assessment:                           - Prior to the procedure, a History and Physical                            was performed, and patient medications and                            allergies were reviewed. The patient's tolerance of                            previous anesthesia was also reviewed. The risks                            and benefits of the procedure and the sedation                            options and risks were discussed with the patient.                            All questions were answered, and informed consent                            was obtained. Prior Anticoagulants: The patient has                            taken Plavix (clopidogrel), last dose was 5 days                            prior to procedure. ASA Grade Assessment: III - A                            patient with severe systemic disease. After                            reviewing the risks and benefits, the patient was                            deemed in satisfactory condition to undergo the  procedure.                           After obtaining informed consent, the colonoscope                            was passed under direct vision. Throughout the                            procedure, the patient's blood pressure, pulse, and                            oxygen saturations were monitored continuously. The                            Colonoscope was introduced through the anus and        advanced to the 5 cm into the ileum. The                            colonoscopy was performed without difficulty. The                            patient tolerated the procedure. The quality of the                            bowel preparation was adequate. The terminal ileum,                            ileocecal valve, appendiceal orifice, and rectum                            were photographed. Scope In: 10:40:22 AM Scope Out: 11:13:57 AM Scope Withdrawal Time: 0 hours 31 minutes 27 seconds  Total Procedure Duration: 0 hours 33 minutes 35 seconds  Findings:                 The digital rectal exam findings include                            hemorrhoids. Pertinent negatives include no                            palpable rectal lesions.                           The terminal ileum and ileocecal valve appeared                            normal. Biopsies were taken with a cold forceps for                            histology.                           Patchy moderate inflammation characterized by  altered vascularity, congestion (edema), erythema,                            friability, granularity and linear erosions was                            found in the proximal transverse colon, in the mid                            transverse colon, in the ascending colon and in the                            cecum. Biopsies were taken with a cold forceps for                            histology to rule out chronic colitis.                           Normal mucosa was found in the rectum, in the                            recto-sigmoid colon, in the sigmoid colon and in                            the descending colon. Biopsies were taken with a                            cold forceps for histology for left-sided colon                            rule out. Biopsies were taken with a cold forceps                            for histology to rule out proctitis.                            Nine sessile polyps were found in the descending                            colon (1), transverse colon (2), hepatic flexure                            (1) and ascending colon (5). The polyps were 2 to 6                            mm in size. These polyps were removed with a cold                            snare. Resection and retrieval were complete.                           Non-bleeding non-thrombosed external and internal  hemorrhoids were found during retroflexion, during                            perianal exam and during digital exam. Complications:            No immediate complications. Estimated Blood Loss:     Estimated blood loss was minimal. Impression:               - Hemorrhoids found on digital rectal exam.                           - The examined portion of the ileum was normal.                            Biopsied.                           - Patchy moderate inflammation was found in the                            proximal transverse colon, in the mid transverse                            colon, in the ascending colon and in the cecum                            secondary to left-sided colitis. Biopsied to rule                            out chronic colitis.                           - Normal mucosa in the rectum, in the recto-sigmoid                            colon, in the sigmoid colon and in the descending                            colon. Biopsied.                           - Nine 2 to 6 mm polyps in the descending colon, in                            the transverse colon, at the hepatic flexure and in                            the ascending colon, removed with a cold snare.                            Resected and retrieved.                           - Non-bleeding non-thrombosed external and internal  hemorrhoids. Recommendation:           - The patient will be observed post-procedure,                             until all discharge criteria are met.                           - Discharge patient to home.                           - Patient has a contact number available for                            emergencies. The signs and symptoms of potential                            delayed complications were discussed with the                            patient. Return to normal activities tomorrow.                            Written discharge instructions were provided to the                            patient.                           - High fiber diet.                           - Use FiberCon 1 tablet PO daily.                           - Await pathology results.                           - Repeat colonoscopy in 3 years for surveillance                            based on pathology results.                           - To decrease risk of post-procedural bleeding, do                            no restart Plavix for 72 hours.                           - The findings and recommendations were discussed                            with the patient. Justice Britain, MD 11/01/2018 11:28:56 AM

## 2018-11-01 NOTE — Progress Notes (Signed)
Called to room to assist during endoscopic procedure.  Patient ID and intended procedure confirmed with present staff. Received instructions for my participation in the procedure from the performing physician.  

## 2018-11-01 NOTE — Patient Instructions (Addendum)
PICK UP YOUR NEW PRESCRIPTION OMEPRAZOLE 40 MG TAKE THIS MEDICATION EVERY AM, 20-30 MINUTES PRIOR TO MORNING MEAL . PRESCRIPTION SENT TO [facility]  START A HIGH FIBER DIET, USE FIBER CON 1 TABLET DAILY.  HANDOUTS [place]  RESTART YOUR PLAVIX IN 24 HOURS, Friday, 11/04/2018     YOU HAD AN ENDOSCOPIC PROCEDURE TODAY AT [facility]:   Refer to the procedure report that was given to you for any specific questions about what was found during the examination.  If the procedure report does not answer your questions, please call your gastroenterologist to clarify.  If you requested that your care partner not be given the details of your procedure findings, then the procedure report has been included in a sealed envelope for you to review at your convenience later.  YOU SHOULD EXPECT: Some feelings of bloating in the abdomen. Passage of more gas than usual.  Walking can help get rid of the air that was put into your GI tract during the procedure and reduce the bloating. If you had a lower endoscopy (such as a colonoscopy or flexible sigmoidoscopy) you may notice spotting of blood in your stool or on the toilet paper. If you underwent a bowel prep for your procedure, you may not have a normal bowel movement for a few days.  Please Note:  You might notice some irritation and congestion in your nose or some drainage.  This is from the oxygen used during your procedure.  There is no need for concern and it should clear up in a day or so.  SYMPTOMS TO REPORT IMMEDIATELY:   Following lower endoscopy (colonoscopy or flexible sigmoidoscopy):  Excessive amounts of blood in the stool  Significant tenderness or worsening of abdominal pains  Swelling of the abdomen that is new, acute  Fever of 100F or higher   Following upper endoscopy (EGD)  Vomiting of blood or coffee ground material  New chest pain or pain under the shoulder blades  Painful or  persistently difficult swallowing  New shortness of breath  Fever of 100F or higher  Black, tarry-looking stools  For urgent or emergent issues, a gastroenterologist can be reached at any hour by calling [phone].   DIET:  We do recommend a small meal at first, but then you may proceed to your regular diet.  Drink plenty of fluids but you should avoid alcoholic beverages for 24 hours.  ACTIVITY:  You should plan to take it easy for the rest of today and you should NOT DRIVE or use heavy machinery until tomorrow (because of the sedation medicines used during the test).    FOLLOW UP: Our staff will call the number listed on your records 48-72 hours following your procedure to check on you and address any questions or concerns that you may have regarding the information given to you following your procedure. If we do not reach you, we will leave a message.  We will attempt to reach you two times.  During this call, we will ask if you have developed any symptoms of COVID 19. If you develop any symptoms (ie: fever, flu-like symptoms, shortness of breath, cough etc.) before then, please call [phone].  If you test positive for Covid 19 in the 2 weeks post procedure, please call and report this information to [place].    If any biopsies were taken you will be contacted by phone or by letter within the next 1-3 weeks.  Please call us at [phone] if you have not heard about the biopsies in 3 weeks.    SIGNATURES/CONFIDENTIALITY: You and/or your care partner have signed paperwork which will be entered into your electronic medical record.  These signatures attest to the fact that that the information above on your After Visit Summary has been reviewed and is understood.  Full responsibility of the confidentiality of this discharge information lies with you and/or your care-partner.

## 2018-11-01 NOTE — Progress Notes (Signed)
PT taken to PACU. Monitors in place. VSS. Report given to RN. 

## 2018-11-03 ENCOUNTER — Telehealth: Payer: Self-pay

## 2018-11-03 ENCOUNTER — Telehealth: Payer: Self-pay | Admitting: *Deleted

## 2018-11-03 NOTE — Telephone Encounter (Signed)
  Follow up Call-  Call back number 11/01/2018  Post procedure Call Back phone  # [phone]  Permission to leave phone message Yes  Some recent data might be hidden     Patient questions:  Do you have a fever, pain , or abdominal swelling? No. Pain Score  0 *  Have you tolerated food without any problems? Yes.    Have you been able to return to your normal activities? Yes  Do you have any questions about your discharge instructions: Diet   No. Medications  No. Follow up visit  No.  Do you have questions or concerns about your Care? No.  Actions: * If pain score is 4 or above: No action needed, pain <4.  1. Have you developed a fever since your procedure? No  2.   Have you had an respiratory symptoms (SOB or cough) since your procedure? No  3.   Have you tested positive for COVID 19 since your procedure No  4.   Have you had any family members/close contacts diagnosed with the COVID 19 since your procedure?  No   If yes to any of these questions please route to Joylene John, RN and Alphonsa Gin, RN.

## 2018-11-03 NOTE — Telephone Encounter (Signed)
  Follow up Call-  Call back number 11/01/2018  Post procedure Call Back phone  # [phone]  Permission to leave phone message Yes  Some recent data might be hidden     Patient questions:  Message left to call us if necessary.

## 2018-11-07 ENCOUNTER — Other Ambulatory Visit: Payer: Self-pay

## 2018-11-07 ENCOUNTER — Encounter: Payer: Self-pay | Admitting: Gastroenterology

## 2018-11-07 DIAGNOSIS — K7460 Unspecified cirrhosis of liver: Secondary | ICD-10-CM

## 2018-11-07 NOTE — Progress Notes (Signed)
Left message on machine to call back  

## 2018-11-07 NOTE — Progress Notes (Signed)
Patient had recent upper endoscopy and colonoscopy completed. Patient has evidence of portal hypertension in the setting of varicose veins of the esophagus. He needs to have imaging for [facility] screening. Also found to have gastric intestinal metaplasia which will require follow-up endoscopy. Tubular adenomas found on colonoscopy which would require follow-up colonoscopy in a few years. I think it would be best if we get the patient back into clinic in the next 4 to 5 weeks so that we can go over things with he and his wife as to next steps in his care and evaluation. He has not had abdominal imaging in quite a few months (within a year) so we need to get him up-to-date for [facility] screening. Patty, please schedule a liver Doppler ultrasound to evaluate the liver parenchyma and evaluate for [facility] and ensure good flow of the portal vasculature.  Thank you. GM

## 2018-11-07 NOTE — Progress Notes (Signed)
You have been scheduled for an abdominal ultrasound at [facility] (1st floor of hospital) on 11/11/18 at 9 am. Please arrive 15 minutes prior to your appointment for registration. Make certain not to have anything to eat or drink 6 hours prior to your appointment. Should you need to reschedule your appointment, please contact radiology at [phone]. This test typically takes about 30 minutes to perform.

## 2018-11-07 NOTE — Progress Notes (Signed)
The patient has been notified of this information and all questions answered.

## 2018-11-07 NOTE — Progress Notes (Signed)
ROV 11/29/18 at 1010 am with Dr Rush Landmark

## 2018-11-09 ENCOUNTER — Ambulatory Visit (INDEPENDENT_AMBULATORY_CARE_PROVIDER_SITE_OTHER): Payer: Medicare Other | Admitting: Family Medicine

## 2018-11-09 ENCOUNTER — Encounter: Payer: Self-pay | Admitting: Family Medicine

## 2018-11-09 ENCOUNTER — Other Ambulatory Visit: Payer: Self-pay

## 2018-11-09 ENCOUNTER — Other Ambulatory Visit: Payer: Medicare Other

## 2018-11-09 ENCOUNTER — Telehealth: Payer: Self-pay | Admitting: Gastroenterology

## 2018-11-09 ENCOUNTER — Ambulatory Visit: Payer: Self-pay

## 2018-11-09 VITALS — BP 128/78 | HR 76 | Ht 72.0 in

## 2018-11-09 DIAGNOSIS — M76829 Posterior tibial tendinitis, unspecified leg: Secondary | ICD-10-CM

## 2018-11-09 DIAGNOSIS — G8929 Other chronic pain: Secondary | ICD-10-CM | POA: Diagnosis not present

## 2018-11-09 DIAGNOSIS — M25571 Pain in right ankle and joints of right foot: Secondary | ICD-10-CM

## 2018-11-09 DIAGNOSIS — B20 Human immunodeficiency virus [HIV] disease: Secondary | ICD-10-CM

## 2018-11-09 NOTE — Progress Notes (Signed)
[facility] [address] [place], [place] [address] Phone: [phone] Subjective:   Fontaine No, am serving as a scribe for Dr. Hulan Saas.  I'm seeing this patient by the request  of:    CC: Right ankle pain  RWE:[id number]   10/10/2018 Patient has signs and symptoms more consistent with a cellulitis that is not completely resolved at this time.  We discussed with patient, but also peripheral vascular disease is within the differential.  Do not see any type of cyst formation noted at the moment.  Mild underlying arthritis.  Discussed proper shoes, compression socks, topical anti-inflammatories and icing regimen.  Encourage patient to take the Bactrim and finished that in the near future.  Follow-up again 4 to 8 weeks  Update 11/09/2018 Lavere Stork is a 68 y.o. male coming in with complaint of right ankle pain. Pain is better but does have pain with walking and standing over medial malleolus. Feels that he is favoring ankle when he walks.  Patient states that the swelling is improved but now still having pain and 1 very specific place.  Patient states that certain range of motion does have more difficulty.    Past Medical History:  Diagnosis Date  . Abnormal result of cardiovascular function study, unspecified   . Atherosclerotic heart disease of native coronary artery without angina pectoris   . CAD (coronary artery disease)   . Coronary angioplasty status   . Diabetes mellitus without complication ([place])   . Essential (primary) hypertension   . Gallstones   . Hepatic cirrhosis ([place])   . Hepatitis C   . HIV infection ([place])   . Hyperlipidemia   . Hypertension   . Internal hemorrhoids   . Kidney stones   . Mixed hyperlipidemia   . Palpitations   . Shortness of breath   . Sleep apnea    CPAP  . Tubular adenoma of colon    Past Surgical History:  Procedure Laterality Date  . CARDIAC CATHETERIZATION Left 04/2013  . chlecystectomy    .  COLONOSCOPY  05/02/2013  . PERCUTANEOUS CORONARY STENT INTERVENTION (PCI-S)    . POLYPECTOMY    . UMBILICAL HERNIA REPAIR     Social History   Socioeconomic History  . Marital status: Married    Spouse name: Not on file  . Number of children: 1  . Years of education: Not on file  . Highest education level: 10th grade  Occupational History  . Occupation: retired  Scientific laboratory technician  . Financial resource strain: Not on file  . Food insecurity    Worry: Never true    Inability: Never true  . Transportation needs    Medical: No    Non-medical: No  Tobacco Use  . Smoking status: Former Smoker    Types: Cigarettes    Quit date: 1994    Years since quitting: 26.6  . Smokeless tobacco: Never Used  Substance and Sexual Activity  . Alcohol use: Yes    Frequency: Never    Comment: social-occ beer  . Drug use: No  . Sexual activity: Yes  Lifestyle  . Physical activity    Days per week: Not on file    Minutes per session: Not on file  . Stress: Not on file  Relationships  . Social Herbalist on phone: Not on file    Gets together: Not on file    Attends religious service: Not on file    Active  member of club or organization: Not on file    Attends meetings of clubs or organizations: Not on file    Relationship status: Not on file  Other Topics Concern  . Not on file  Social History Narrative  . Not on file   Allergies  Allergen Reactions  . Lopressor [Metoprolol Tartrate] Rash and Other (See Comments)    Bleeding (non-specific)   . Integrilin [Eptifibatide] Other (See Comments)    Bleeding (non-specific)   Family History  Problem Relation Age of Onset  . Emphysema Mother        pulmonary  . Heart attack Father 25  . Lung cancer Sister   . Esophageal cancer Brother   . Esophageal cancer Brother   . Colon cancer Neg Hx   . Inflammatory bowel disease Neg Hx   . Liver disease Neg Hx   . Pancreatic cancer Neg Hx   . Rectal cancer Neg Hx   . Stomach cancer  Neg Hx   . Colon polyps Neg Hx     Current Outpatient Medications (Endocrine & Metabolic):  .  sitaGLIPtin (JANUVIA) 100 MG tablet, Take 100 mg by mouth daily. Marland Kitchen  SYNJARDY XR 12.07-998 MG TB24, TK 2 TS PO QD .  predniSONE (DELTASONE) 10 MG tablet, 3 tabs by mouth per day for 3 days,2tabs per day for 3 days,1tab per day for 3 days  Current Outpatient Medications (Cardiovascular):  .  amLODipine (NORVASC) 2.5 MG tablet, Take 2.5 mg by mouth daily. Marland Kitchen  atenolol (TENORMIN) 50 MG tablet, Take 50 mg by mouth daily. Marland Kitchen  losartan-hydrochlorothiazide (HYZAAR) 100-25 MG tablet, Take 1 tablet by mouth daily.  Marland Kitchen  omega-3 acid ethyl esters (LOVAZA) 1 g capsule, Take 1 g by mouth daily.  .  rosuvastatin (CRESTOR) 10 MG tablet, Take 10 mg by mouth daily.  Marland Kitchen  VASCEPA 1 g CAPS, TK 2 CS PO BID  Current Outpatient Medications (Respiratory):  .  fluticasone furoate-vilanterol (BREO ELLIPTA) 200-25 MCG/INH AEPB, Inhale 1 puff into the lungs daily. .  benzonatate (TESSALON) 100 MG capsule, Take 1 capsule (100 mg total) by mouth 2 (two) times daily as needed for cough. .  benzonatate (TESSALON) 200 MG capsule, Take 1 capsule (200 mg total) by mouth 3 (three) times daily as needed for cough. .  promethazine-dextromethorphan (PROMETHAZINE-DM) 6.25-15 MG/5ML syrup, Take 5 mLs by mouth 4 (four) times daily as needed for cough.  Current Outpatient Medications (Analgesics):  .  aspirin 81 MG tablet, Take 81 mg by mouth daily. .  traMADol (ULTRAM) 50 MG tablet, Take 1 tablet (50 mg total) by mouth every 6 (six) hours as needed.  Current Outpatient Medications (Hematological):  .  clopidogrel (PLAVIX) 75 MG tablet, Take 1 tablet (75 mg total) by mouth daily.  Current Outpatient Medications (Other):  Marland Kitchen  Multiple Vitamin (MULTIVITAMIN) tablet, Take 1 tablet by mouth daily. .  ODEFSEY 200-25-25 MG TABS tablet, TAKE 1 TABLET BY MOUTH DAILY WITH BREAKFAST. Marland Kitchen  omeprazole (PRILOSEC) 40 MG capsule, Take 1 capsule (40 mg  total) by mouth daily. Marland Kitchen  sulfamethoxazole-trimethoprim (BACTRIM DS) 800-160 MG tablet, Take 1 tablet by mouth 2 (two) times daily.    Past medical history, social, surgical and family history all reviewed in electronic medical record.  No pertanent information unless stated regarding to the chief complaint.   Review of Systems:  No headache, visual changes, nausea, vomiting, diarrhea, constipation, dizziness, abdominal pain, skin rash, fevers, chills, night sweats, weight loss, swollen lymph nodes, body aches,  joint swelling,, chest pain, shortness of breath, mood changes.  Positive muscle aches  Objective  Blood pressure 128/78, pulse 76, height 6' (1.829 m), SpO2 94 %.    General: No apparent distress alert and oriented x3 mood and affect normal, dressed appropriately.  HEENT: Pupils equal, extraocular movements intact  Respiratory: Patient's speak in full sentences and does not appear short of breath  Cardiovascular: Trace lower extremity edema, mildly tender, no erythema  Skin: Warm dry intact with no signs of infection or rash on extremities or on axial skeleton.  Abdomen: Soft nontender  Neuro: Cranial nerves II through XII are intact, neurovascularly intact in all extremities with 2+ DTRs and 2+ pulses.  Lymph: No lymphadenopathy of posterior or anterior cervical chain or axillae bilaterally.  Gait antalgic MSK:  tender with mild limited range of motion and good stability and symmetric strength and tone of shoulders, elbows, wrist, hip, knee and ankles bilaterally.   Right ankle exam shows the patient does still have a small effusion of the joint.  Patient noted skin significantly improved.  Pain over the posterior tibialis tendon just inferior to the medial malleolus.  Minimal pain over the navicular bone.  Underlying arthritic changes of ankle joint noted with mild limited range of motion.  Limited musculoskeletal ultrasound was performed and interpreted by Lyndal Pulley   Limited ultrasound of patient's ankle shows the patient is in moderate to severe narrowing of the medial joint space of the talar dome.  No loose bodies noted.  Significant hypoechoic changes of the posterior tibialis tendon.  Patient does have mild increase in Doppler flow in this area.  Soft tissue swelling is improved.   Impression and Recommendations:     This case required medical decision making of moderate complexity. The above documentation has been reviewed and is accurate and complete Lyndal Pulley, DO       Note: This dictation was prepared with Dragon dictation along with smaller phrase technology. Any transcriptional errors that result from this process are unintentional.

## 2018-11-09 NOTE — Telephone Encounter (Signed)
The pt has been scheduled for a Liver US and had questions regarding the prep.  I advised him to be NPO for 6 hours and follow any further instructions regarding drinking 32 oz water and have a full bladder given to him by [facility] radiology.  The pt has been advised of the information and verbalized understanding.

## 2018-11-09 NOTE — Patient Instructions (Signed)
Heel lift 1/8 in in shoe Ice after walking dog See me again in 6 weeks, if not better we will consider more advanced treatment

## 2018-11-09 NOTE — Telephone Encounter (Signed)
Pt requested a call to discuss procedure scheduled at [facility] 11/11/18.

## 2018-11-09 NOTE — Assessment & Plan Note (Signed)
Underlying arthritis of the ankle.  Patient continue with, topical anti-inflammatories, icing regimen, continue the compression, discussed which activities to do which wants to avoid.  Patient will come back in 6 weeks if continues to have pain we will consider the possibility of injection.  For reminder patient is hepatitis C and HIV positive

## 2018-11-10 LAB — T-HELPER CELL (CD4) - (RCID CLINIC ONLY)
CD4 % Helper T Cell: 57 % (ref 33–65)
CD4 T Cell Abs: 793 /uL (ref 400–1790)

## 2018-11-11 ENCOUNTER — Other Ambulatory Visit: Payer: Self-pay

## 2018-11-11 ENCOUNTER — Ambulatory Visit (HOSPITAL_COMMUNITY)
Admission: RE | Admit: 2018-11-11 | Discharge: 2018-11-11 | Disposition: A | Discharge status: Home / Self Care | Payer: Medicare Other | Source: Ambulatory Visit | Attending: Gastroenterology | Admitting: Gastroenterology

## 2018-11-11 DIAGNOSIS — K7460 Unspecified cirrhosis of liver: Secondary | ICD-10-CM | POA: Diagnosis not present

## 2018-11-11 DIAGNOSIS — R161 Splenomegaly, not elsewhere classified: Secondary | ICD-10-CM | POA: Diagnosis not present

## 2018-11-12 LAB — CBC
HCT: 45.4 % (ref 38.5–50.0)
Hemoglobin: 15.9 g/dL (ref 13.2–17.1)
MCH: 32.6 pg (ref 27.0–33.0)
MCHC: 35 g/dL (ref 32.0–36.0)
MCV: 93 fL (ref 80.0–100.0)
MPV: 10.6 fL (ref 7.5–12.5)
Platelets: 105 10*3/uL — ABNORMAL LOW (ref 140–400)
RBC: 4.88 10*6/uL (ref 4.20–5.80)
RDW: 12.8 % (ref 11.0–15.0)
WBC: 5 10*3/uL (ref 3.8–10.8)

## 2018-11-12 LAB — COMPREHENSIVE METABOLIC PANEL
AG Ratio: 1.3 (calc) (ref 1.0–2.5)
ALT: 22 U/L (ref 9–46)
AST: 29 U/L (ref 10–35)
Albumin: 3.7 g/dL (ref 3.6–5.1)
Alkaline phosphatase (APISO): 97 U/L (ref 35–144)
BUN/Creatinine Ratio: 29 (calc) — ABNORMAL HIGH (ref 6–22)
BUN: 18 mg/dL (ref 7–25)
CO2: 28 mmol/L (ref 20–32)
Calcium: 9.2 mg/dL (ref 8.6–10.3)
Chloride: 105 mmol/L (ref 98–110)
Creat: 0.63 mg/dL — ABNORMAL LOW (ref 0.70–1.25)
Globulin: 2.8 g/dL (calc) (ref 1.9–3.7)
Glucose, Bld: 130 mg/dL — ABNORMAL HIGH (ref 65–99)
Potassium: 3.9 mmol/L (ref 3.5–5.3)
Sodium: 139 mmol/L (ref 135–146)
Total Bilirubin: 2.7 mg/dL — ABNORMAL HIGH (ref 0.2–1.2)
Total Protein: 6.5 g/dL (ref 6.1–8.1)

## 2018-11-12 LAB — LIPID PANEL
Cholesterol: 149 mg/dL (ref <200)
HDL: 72 mg/dL (ref >=40)
LDL Cholesterol (Calc): 63 mg/dL (calc)
Non-HDL Cholesterol (Calc): 77 mg/dL (calc) (ref <130)
Total CHOL/HDL Ratio: 2.1 (calc) (ref <5.0)
Triglycerides: 64 mg/dL (ref <150)

## 2018-11-12 LAB — HIV-1 RNA QUANT-NO REFLEX-BLD
HIV 1 RNA Quant: <20 copies/mL
HIV-1 RNA Quant, Log: <1.3 Log copies/mL

## 2018-11-12 LAB — RPR: RPR Ser Ql: NONREACTIVE

## 2018-11-17 DIAGNOSIS — Z23 Encounter for immunization: Secondary | ICD-10-CM | POA: Diagnosis not present

## 2018-11-23 ENCOUNTER — Encounter: Payer: Self-pay | Admitting: Family

## 2018-11-23 ENCOUNTER — Telehealth: Payer: Self-pay | Admitting: Pharmacy Technician

## 2018-11-23 ENCOUNTER — Ambulatory Visit (INDEPENDENT_AMBULATORY_CARE_PROVIDER_SITE_OTHER): Payer: Medicare Other | Admitting: Family

## 2018-11-23 ENCOUNTER — Other Ambulatory Visit: Payer: Self-pay

## 2018-11-23 VITALS — BP 113/69 | HR 59 | Temp 98.1°F

## 2018-11-23 DIAGNOSIS — E1165 Type 2 diabetes mellitus with hyperglycemia: Secondary | ICD-10-CM

## 2018-11-23 DIAGNOSIS — I10 Essential (primary) hypertension: Secondary | ICD-10-CM | POA: Diagnosis not present

## 2018-11-23 DIAGNOSIS — Z0000 Encounter for general adult medical examination without abnormal findings: Secondary | ICD-10-CM | POA: Diagnosis not present

## 2018-11-23 DIAGNOSIS — B20 Human immunodeficiency virus [HIV] disease: Secondary | ICD-10-CM | POA: Diagnosis not present

## 2018-11-23 DIAGNOSIS — Z113 Encounter for screening for infections with a predominantly sexual mode of transmission: Secondary | ICD-10-CM

## 2018-11-23 MED ORDER — BICTEGRAVIR-EMTRICITAB-TENOFOV 50-200-25 MG PO TABS: 1.0000 | ORAL_TABLET | Freq: Every day | ORAL | 5 refills | Status: DC

## 2018-11-23 NOTE — Progress Notes (Signed)
Subjective:    Patient ID: Tyler Hendricks, male    DOB: 12/06/1950, 68 y.o.   MRN: [id number]  Chief Complaint  Patient presents with  . HIV Positive/AIDS     HPI:  Tyler Hendricks is a 68 y.o. male with HIV disease who was last seen in the office on 05/11/18 with good adherence and tolerance to his ART regimen of Odefsey. Blood work at the time showed a viral load that was undetectable and CD4 count of 670. Most recent blood work completed on 11/09/18 with continued viral suppression and is undetectable with CD4 count of 793. Renal function, electrolytes and liver function within the normal ranges. He received his influenza vaccination on 11/17/18. Awaiting dental screening with [facility].   [name] continues to take his Vernell Leep as prescribed no adverse side effects or missed doses since his last office visit.  Overall doing very well with no new concerns/complaints.  He was started on omeprazole recently following colonoscopy/endoscopy.Denies fevers, chills, night sweats, headaches, changes in vision, neck pain/stiffness, nausea, diarrhea, vomiting, lesions or rashes.  [name] has no problems obtaining his medication from the pharmacy and remains covered through Medicare.  Denies feelings of being down, depressed, or hopeless recently.  No recreational or illicit drug use, alcohol consumption, or tobacco use.  He remains in a monogamous relationship with his wife.   Allergies  Allergen Reactions  . Lopressor [Metoprolol Tartrate] Rash and Other (See Comments)    Bleeding (non-specific)   . Integrilin [Eptifibatide] Other (See Comments)    Bleeding (non-specific)      Outpatient Medications Prior to Visit  Medication Sig Dispense Refill  . amLODipine (NORVASC) 2.5 MG tablet Take 2.5 mg by mouth daily.    Marland Kitchen aspirin 81 MG tablet Take 81 mg by mouth daily.    Marland Kitchen atenolol (TENORMIN) 50 MG tablet Take 50 mg by mouth daily.  3  . clopidogrel (PLAVIX) 75 MG tablet Take 1 tablet (75 mg  total) by mouth daily. 90 tablet 1  . fluticasone furoate-vilanterol (BREO ELLIPTA) 200-25 MCG/INH AEPB Inhale 1 puff into the lungs daily. 12 each 0  . losartan-hydrochlorothiazide (HYZAAR) 100-25 MG tablet Take 1 tablet by mouth daily.   3  . Multiple Vitamin (MULTIVITAMIN) tablet Take 1 tablet by mouth daily.    Marland Kitchen omega-3 acid ethyl esters (LOVAZA) 1 g capsule Take 1 g by mouth daily.     Marland Kitchen omeprazole (PRILOSEC) 40 MG capsule Take 1 capsule (40 mg total) by mouth daily. 30 capsule 3  . rosuvastatin (CRESTOR) 10 MG tablet Take 10 mg by mouth daily.   3  . sitaGLIPtin (JANUVIA) 100 MG tablet Take 100 mg by mouth daily.    Marland Kitchen SYNJARDY XR 12.07-998 MG TB24 TK 2 TS PO QD  3  . VASCEPA 1 g CAPS TK 2 CS PO BID  11  . ODEFSEY 200-25-25 MG TABS tablet TAKE 1 TABLET BY MOUTH DAILY WITH BREAKFAST. 30 tablet 4  . benzonatate (TESSALON) 100 MG capsule Take 1 capsule (100 mg total) by mouth 2 (two) times daily as needed for cough. 20 capsule 0  . benzonatate (TESSALON) 200 MG capsule Take 1 capsule (200 mg total) by mouth 3 (three) times daily as needed for cough. 90 capsule 1  . predniSONE (DELTASONE) 10 MG tablet 3 tabs by mouth per day for 3 days,2tabs per day for 3 days,1tab per day for 3 days 18 tablet 0  . promethazine-dextromethorphan (PROMETHAZINE-DM) 6.25-15 MG/5ML syrup Take 5 mLs  by mouth 4 (four) times daily as needed for cough. 75 mL 0  . sulfamethoxazole-trimethoprim (BACTRIM DS) 800-160 MG tablet Take 1 tablet by mouth 2 (two) times daily. 20 tablet 0  . traMADol (ULTRAM) 50 MG tablet Take 1 tablet (50 mg total) by mouth every 6 (six) hours as needed. 30 tablet 0   No facility-administered medications prior to visit.      Past Medical History:  Diagnosis Date  . Abnormal result of cardiovascular function study, unspecified   . Atherosclerotic heart disease of native coronary artery without angina pectoris   . CAD (coronary artery disease)   . Coronary angioplasty status   . Diabetes  mellitus without complication ([place])   . Essential (primary) hypertension   . Gallstones   . Hepatic cirrhosis ([place])   . Hepatitis C   . HIV infection ([place])   . Hyperlipidemia   . Hypertension   . Internal hemorrhoids   . Kidney stones   . Mixed hyperlipidemia   . Palpitations   . Shortness of breath   . Sleep apnea    CPAP  . Tubular adenoma of colon      Past Surgical History:  Procedure Laterality Date  . CARDIAC CATHETERIZATION Left 04/2013  . chlecystectomy    . COLONOSCOPY  05/02/2013  . PERCUTANEOUS CORONARY STENT INTERVENTION (PCI-S)    . POLYPECTOMY    . UMBILICAL HERNIA REPAIR         Review of Systems  Constitutional: Negative for appetite change, chills, fatigue, fever and unexpected weight change.  Eyes: Negative for visual disturbance.  Respiratory: Negative for cough, chest tightness, shortness of breath and wheezing.   Cardiovascular: Negative for chest pain and leg swelling.  Gastrointestinal: Negative for abdominal pain, constipation, diarrhea, nausea and vomiting.  Genitourinary: Negative for dysuria, flank pain, frequency, genital sores, hematuria and urgency.  Skin: Negative for rash.  Allergic/Immunologic: Negative for immunocompromised state.  Neurological: Negative for dizziness and headaches.      Objective:    BP 113/69   Pulse (!) 59   Temp 98.1 F (36.7 C)  Nursing note and vital signs reviewed.  Physical Exam Constitutional:      General: He is not in acute distress.    Appearance: He is well-developed. He is obese.     Comments: Seated in the chair; pleasant and sleepy.   Eyes:     Conjunctiva/sclera: Conjunctivae normal.  Neck:     Musculoskeletal: Neck supple.  Cardiovascular:     Rate and Rhythm: Normal rate and regular rhythm.     Heart sounds: Normal heart sounds. No murmur. No friction rub. No gallop.   Pulmonary:     Effort: Pulmonary effort is normal. No respiratory distress.     Breath sounds: Normal breath  sounds. No wheezing or rales.  Chest:     Chest wall: No tenderness.  Abdominal:     General: Bowel sounds are normal.     Palpations: Abdomen is soft.     Tenderness: There is no abdominal tenderness.  Lymphadenopathy:     Cervical: No cervical adenopathy.  Skin:    General: Skin is warm and dry.     Findings: No rash.  Neurological:     Mental Status: He is alert and oriented to person, place, and time.  Psychiatric:        Behavior: Behavior normal.        Thought Content: Thought content normal.        Judgment: Judgment  normal.      Depression screen [facility] 2/9 11/23/2018 10/04/2018 04/15/2017  Decreased Interest 0 0 0  Down, Depressed, Hopeless 0 0 0  PHQ - 2 Score 0 0 0       Assessment & Plan:   Problem List Items Addressed This Visit      Cardiovascular and Mediastinum   Essential hypertension    Blood pressure well controlled and below goal of 140/90 with current medication regimen. Continue to monitor blood pressure at home with changes per primary care.         Endocrine   Type 2 diabetes mellitus with hyperglycemia, without long-term current use of insulin (HCC)    Type 2 diabetes is well controlled with the most recent A1c of 6.2. Continue dietary and lifestyle management.         Other   HIV disease ([place]) - Primary    Mr. Roche has well-controlled HIV disease with good adherence and tolerance to his ART regimen Odefsey.  No signs/symptoms of opportunistic infection or progressive HIV disease at present.  Unfortunately with starting omeprazole there is a contraindication for the papaverine part of Odefsey.  Will discontinue Odefsey and start [place].  Pharmacy evaluating for financial assistance as needed.  Plan for follow-up in 6 months or sooner if needed with lab work 1 to 2 weeks prior to appointment.      Relevant Medications   bictegravir-emtricitabine-tenofovir AF (BIKTARVY) 50-200-25 MG TABS tablet   Other Relevant Orders   T-helper cell (CD4)-  ([facility] clinic only)   HIV-1 RNA quant-no reflex-bld   Comprehensive metabolic panel   Healthcare maintenance     Influenza vaccination up-to-date.  Discussed importance of safe sexual practice to reduce risk of acquisition/transmission of STI.  Remains in monogamous relationship.  Colon cancer screening completed.  Awaiting [facility] referral for dental care.        Other Visit Diagnoses    Screening for STDs (sexually transmitted diseases)       Relevant Orders   RPR       I have discontinued Betty Dorough's benzonatate, promethazine-dextromethorphan, benzonatate, sulfamethoxazole-trimethoprim, traMADol, predniSONE, and Odefsey. I am also having him start on bictegravir-emtricitabine-tenofovir AF. Additionally, I am having him maintain his rosuvastatin, losartan-hydrochlorothiazide, Vascepa, Synjardy XR, aspirin, omega-3 acid ethyl esters, multivitamin, amLODipine, sitaGLIPtin, atenolol, fluticasone furoate-vilanterol, clopidogrel, and omeprazole.   Meds ordered this encounter  Medications  . bictegravir-emtricitabine-tenofovir AF (BIKTARVY) 50-200-25 MG TABS tablet    Sig: Take 1 tablet by mouth daily.    Dispense:  30 tablet    Refill:  5    Order Specific Question:   Supervising Provider    Answer:   Carlyle Basques [[id number]]     Follow-up: Return in about 6 months (around 05/23/2019), or if symptoms worsen or fail to improve.   [name], MSN, FNP-C Nurse Practitioner [facility] [place] number: [phone]

## 2018-11-23 NOTE — Assessment & Plan Note (Addendum)
   Influenza vaccination up-to-date.  Discussed importance of safe sexual practice to reduce risk of acquisition/transmission of STI.  Remains in monogamous relationship.  Colon cancer screening completed.  Awaiting [facility] referral for dental care.

## 2018-11-23 NOTE — Assessment & Plan Note (Signed)
[name] has well-controlled HIV disease with good adherence and tolerance to his ART regimen Odefsey.  No signs/symptoms of opportunistic infection or progressive HIV disease at present.  Unfortunately with starting omeprazole there is a contraindication for the papaverine part of Odefsey.  Will discontinue Odefsey and start [place].  Pharmacy evaluating for financial assistance as needed.  Plan for follow-up in 6 months or sooner if needed with lab work 1 to 2 weeks prior to appointment.

## 2018-11-23 NOTE — Telephone Encounter (Signed)
[facility] Patient Advocate Encounter   I was successful in securing patient a $7500 grant from [employer] [place] (PAF) to provide copayment coverage for Biktarvy. This will make the out of pocket cost $0.     I have spoken with the patient and the new medication will be shipped from [facility] 09/03 to arrive to the home address on 09/04.    The billing information is as follows: RxBin: [id number] PCN: [id number] Member ID: [id number] Group ID: [id number] Dates of Eligibility: 11/23/2018 through 11/23/2019  Patient knows to call the office with questions or concerns.  Bartholomew Crews, CPhT Specialty Pharmacy Patient [facility] for [facility] Phone: [phone] Fax: [phone] 11/23/2018 2:54 PM

## 2018-11-23 NOTE — Assessment & Plan Note (Signed)
Type 2 diabetes is well controlled with the most recent A1c of 6.2. Continue dietary and lifestyle management.

## 2018-11-23 NOTE — Assessment & Plan Note (Signed)
Blood pressure well controlled and below goal of 140/90 with current medication regimen. Continue to monitor blood pressure at home with changes per primary care.

## 2018-11-23 NOTE — Patient Instructions (Signed)
Nice to see you!  We will change your medication from [facility] to [facility] to prevent interaction with omeprazole.   Please continue to take your medication daily.  Plan for follow up in 6 months or sooner if needed with lab work 1-2 weeks prior to appointment.   Have a great day and stay safe!

## 2018-11-24 MED FILL — BIKTARVY 50-200-25 MG TABS: 50-200-25 | 30 days supply | Qty: 30 | Fill #0

## 2018-11-25 DIAGNOSIS — J3089 Other allergic rhinitis: Secondary | ICD-10-CM | POA: Diagnosis not present

## 2018-11-25 DIAGNOSIS — R05 Cough: Secondary | ICD-10-CM | POA: Diagnosis not present

## 2018-11-25 DIAGNOSIS — J3081 Allergic rhinitis due to animal (cat) (dog) hair and dander: Secondary | ICD-10-CM | POA: Diagnosis not present

## 2018-11-29 ENCOUNTER — Ambulatory Visit: Payer: Medicare Other | Admitting: Gastroenterology

## 2018-12-08 ENCOUNTER — Ambulatory Visit
Admission: RE | Admit: 2018-12-08 | Discharge: 2018-12-08 | Disposition: A | Discharge status: Home / Self Care | Payer: Medicare Other | Source: Ambulatory Visit | Attending: Allergy and Immunology | Admitting: Allergy and Immunology

## 2018-12-08 ENCOUNTER — Other Ambulatory Visit: Payer: Self-pay | Admitting: Allergy and Immunology

## 2018-12-08 DIAGNOSIS — R059 Cough, unspecified: Secondary | ICD-10-CM

## 2018-12-08 DIAGNOSIS — R05 Cough: Secondary | ICD-10-CM

## 2018-12-08 DIAGNOSIS — L814 Other melanin hyperpigmentation: Secondary | ICD-10-CM | POA: Diagnosis not present

## 2018-12-08 DIAGNOSIS — L821 Other seborrheic keratosis: Secondary | ICD-10-CM | POA: Diagnosis not present

## 2018-12-08 DIAGNOSIS — L218 Other seborrheic dermatitis: Secondary | ICD-10-CM | POA: Diagnosis not present

## 2018-12-20 ENCOUNTER — Ambulatory Visit: Payer: Medicare Other | Admitting: Family Medicine

## 2018-12-20 MED FILL — BIKTARVY 50-200-25 MG TABS: 50-200-25 | 30 days supply | Qty: 30 | Fill #1

## 2019-01-05 ENCOUNTER — Encounter: Payer: Self-pay | Admitting: Gastroenterology

## 2019-01-05 ENCOUNTER — Other Ambulatory Visit (INDEPENDENT_AMBULATORY_CARE_PROVIDER_SITE_OTHER): Payer: Medicare Other

## 2019-01-05 ENCOUNTER — Telehealth: Payer: Self-pay

## 2019-01-05 ENCOUNTER — Ambulatory Visit (INDEPENDENT_AMBULATORY_CARE_PROVIDER_SITE_OTHER): Payer: Medicare Other | Admitting: Gastroenterology

## 2019-01-05 VITALS — BP 118/64 | HR 79 | Temp 97.1°F | Ht 71.0 in | Wt 229.2 lb

## 2019-01-05 DIAGNOSIS — I8510 Secondary esophageal varices without bleeding: Secondary | ICD-10-CM | POA: Diagnosis not present

## 2019-01-05 DIAGNOSIS — Z1159 Encounter for screening for other viral diseases: Secondary | ICD-10-CM

## 2019-01-05 DIAGNOSIS — Z86010 Personal history of colonic polyps: Secondary | ICD-10-CM

## 2019-01-05 DIAGNOSIS — K7460 Unspecified cirrhosis of liver: Secondary | ICD-10-CM

## 2019-01-05 DIAGNOSIS — K3189 Other diseases of stomach and duodenum: Secondary | ICD-10-CM

## 2019-01-05 DIAGNOSIS — K439 Ventral hernia without obstruction or gangrene: Secondary | ICD-10-CM | POA: Diagnosis not present

## 2019-01-05 DIAGNOSIS — K31A0 Gastric intestinal metaplasia, unspecified: Secondary | ICD-10-CM

## 2019-01-05 DIAGNOSIS — I8500 Esophageal varices without bleeding: Secondary | ICD-10-CM | POA: Diagnosis not present

## 2019-01-05 DIAGNOSIS — R768 Other specified abnormal immunological findings in serum: Secondary | ICD-10-CM

## 2019-01-05 DIAGNOSIS — K22719 Barrett's esophagus with dysplasia, unspecified: Secondary | ICD-10-CM

## 2019-01-05 DIAGNOSIS — R194 Change in bowel habit: Secondary | ICD-10-CM

## 2019-01-05 LAB — COMPREHENSIVE METABOLIC PANEL
ALT: 29 U/L (ref 0–53)
AST: 39 U/L — ABNORMAL HIGH (ref 0–37)
Albumin: 3.9 g/dL (ref 3.5–5.2)
Alkaline Phosphatase: 115 U/L (ref 39–117)
BUN: 22 mg/dL (ref 6–23)
CO2: 24 mEq/L (ref 19–32)
Calcium: 9.7 mg/dL (ref 8.4–10.5)
Chloride: 105 mEq/L (ref 96–112)
Creatinine, Ser: 0.9 mg/dL (ref 0.40–1.50)
GFR: 83.84 mL/min (ref >60.00)
Glucose, Bld: 137 mg/dL — ABNORMAL HIGH (ref 70–99)
Potassium: 3.8 mEq/L (ref 3.5–5.1)
Sodium: 139 mEq/L (ref 135–145)
Total Bilirubin: 3 mg/dL — ABNORMAL HIGH (ref 0.2–1.2)
Total Protein: 6.8 g/dL (ref 6.0–8.3)

## 2019-01-05 LAB — IGA: IgA: 367 mg/dL (ref 68–378)

## 2019-01-05 LAB — CBC
HCT: 44.6 % (ref 39.0–52.0)
Hemoglobin: 15.8 g/dL (ref 13.0–17.0)
MCHC: 35.4 g/dL (ref 30.0–36.0)
MCV: 93.7 fl (ref 78.0–100.0)
Platelets: 105 10*3/uL — ABNORMAL LOW (ref 150.0–400.0)
RBC: 4.76 Mil/uL (ref 4.22–5.81)
RDW: 13.9 % (ref 11.5–15.5)
WBC: 5.8 10*3/uL (ref 4.0–10.5)

## 2019-01-05 LAB — PROTIME-INR
INR: 1.2 ratio — ABNORMAL HIGH (ref 0.8–1.0)
Prothrombin Time: 13.7 s — ABNORMAL HIGH (ref 9.6–13.1)

## 2019-01-05 NOTE — Telephone Encounter (Signed)
Okay to hold aspirin and plavix for 5 days pre-operative.

## 2019-01-05 NOTE — Progress Notes (Signed)
[place] VISIT   Primary Care Provider Hoyt Koch, MD [place] [id number] [phone]  Patient Profile: Algis Lehenbauer is a 68 y.o. male with a pmh significant for CAD (on Plavix), diabetes, hypertension, HIV, hyperlipidemia, hemorrhoids, colon polyps, sleep apnea, Cirrhosis with manifested portal hypertension with varices, Positive HCV Antibody (negative RNA and no treatment), HBV Core Antibody positive.  The patient presents to the [facility] for an evaluation and management of problem(s) noted below:  Problem List 1. Cirrhosis of liver without ascites, unspecified hepatic cirrhosis type ([place])   2. Secondary esophageal varices without bleeding ([place])   3. Intestinal metaplasia of gastric mucosa   4. Hx of colonic polyp   5. Ventral hernia without obstruction or gangrene   6. Positive hepatitis C antibody test   7. Screening for viral disease   8. Change in bowel habit   9. Hepatitis B core antibody positive     History of Present Illness Please see initial consultation note for full details.  Interval History The patient returns for planned follow up.  He did well with undergoing his EGD/Colonoscopy.  Findings confirm evidence of portal hypertension with varices noted.  Recent U/S shows evidence of no HCC.  Patient has no complaints overall.  Still feels that he has a distended abdomen but no more than prior.  Weight has been stable overall.  No swallowing issues.  The patient denies any issues with jaundice, scleral icterus, pruritus, darkened/amber urine, clay-colored stools, LE edema, hematemesis, coffee-ground emesis, confusion, new generalized pruritus.  His last bilirubin was elevated in the 2 range.  His previous changes in bowel habits have stabilized.  GI Review of Systems Positive as above Negative for change in bowel habits, melena, hematochezia  Review of Systems General: Denies  fevers/chills Cardiovascular: Denies chest pain/palpitations Pulmonary: Denies shortness of breath Gastroenterological: See HPI Genitourinary: Denies darkened urine Hematological: Positive for easy bruising/bleeding due to Plavix use Dermatological: Denies jaundice Psychological: Mood is stable   Medications Current Outpatient Medications  Medication Sig Dispense Refill   amLODipine (NORVASC) 2.5 MG tablet Take 2.5 mg by mouth daily.     aspirin 81 MG tablet Take 81 mg by mouth daily.     atenolol (TENORMIN) 50 MG tablet Take 50 mg by mouth daily.  3   bictegravir-emtricitabine-tenofovir AF (BIKTARVY) 50-200-25 MG TABS tablet Take 1 tablet by mouth daily. 30 tablet 5   budesonide-formoterol (SYMBICORT) 80-4.5 MCG/ACT inhaler      clopidogrel (PLAVIX) 75 MG tablet Take 1 tablet (75 mg total) by mouth daily. 90 tablet 1   losartan-hydrochlorothiazide (HYZAAR) 100-25 MG tablet Take 1 tablet by mouth daily.   3   Multiple Vitamin (MULTIVITAMIN) tablet Take 1 tablet by mouth daily.     omega-3 acid ethyl esters (LOVAZA) 1 g capsule Take 1 g by mouth daily.      omeprazole (PRILOSEC) 40 MG capsule Take 1 capsule (40 mg total) by mouth daily. 30 capsule 3   rosuvastatin (CRESTOR) 10 MG tablet Take 10 mg by mouth daily.   3   sitaGLIPtin (JANUVIA) 100 MG tablet Take 100 mg by mouth daily.     Spacer/Aero-Holding Chambers (AEROCHAMBER PLUS FLO-VU LARGE) MISC See admin instructions.     SYNJARDY XR 12.07-998 MG TB24 TK 2 TS PO QD  3   VASCEPA 1 g CAPS TK 2 CS PO BID  11   No current facility-administered medications for this visit.     Allergies  Allergies  Allergen Reactions   Lopressor [Metoprolol Tartrate] Rash and Other (See Comments)    Bleeding (non-specific)    Integrilin [Eptifibatide] Other (See Comments)    Bleeding (non-specific)    Histories Past Medical History:  Diagnosis Date   Abnormal result of cardiovascular function study, unspecified     Atherosclerotic heart disease of native coronary artery without angina pectoris    CAD (coronary artery disease)    Coronary angioplasty status    Diabetes mellitus without complication (HCC)    Essential (primary) hypertension    Gallstones    Hepatic cirrhosis (HCC)    Hepatitis C    HIV infection ([place])    Hyperlipidemia    Hypertension    Internal hemorrhoids    Kidney stones    Mixed hyperlipidemia    Palpitations    Shortness of breath    Sleep apnea    CPAP   Tubular adenoma of colon    Past Surgical History:  Procedure Laterality Date   CARDIAC CATHETERIZATION Left 04/2013   chlecystectomy     COLONOSCOPY  05/02/2013   PERCUTANEOUS CORONARY STENT INTERVENTION (PCI-S)     POLYPECTOMY     UMBILICAL HERNIA REPAIR     Social History   Socioeconomic History   Marital status: Married    Spouse name: Not on file   Number of children: 1   Years of education: Not on file   Highest education level: 10th grade  Occupational History   Occupation: retired  Scientist, 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    Worry: Never true    Inability: Never true   Transportation needs    Medical: No    Non-medical: No  Tobacco Use   Smoking status: Former Smoker    Types: Cigarettes    Quit date: 1994    Years since quitting: 26.8   Smokeless tobacco: Never Used  Substance and Sexual Activity   Alcohol use: Yes    Frequency: Never    Comment: social-occ beer   Drug use: No   Sexual activity: Yes  Lifestyle   Physical activity    Days per week: Not on file    Minutes per session: Not on file   Stress: Not on file  Relationships   Social connections    Talks on phone: Not on file    Gets together: Not on file    Attends religious service: Not on file    Active member of club or organization: Not on file    Attends meetings of clubs or organizations: Not on file    Relationship status: Not on file    Intimate partner violence    Fear of current or ex partner: No    Emotionally abused: No    Physically abused: No    Forced sexual activity: No  Other Topics Concern   Not on file  Social History Narrative   Not on file   Family History  Problem Relation Age of Onset   Emphysema Mother        pulmonary   Heart attack Father 50   Lung cancer Sister    Esophageal cancer Brother    Esophageal cancer Brother    Colon cancer Neg Hx    Inflammatory bowel disease Neg Hx    Liver disease Neg Hx    Pancreatic cancer Neg Hx    Rectal cancer Neg Hx    Stomach cancer Neg Hx  Colon polyps Neg Hx   I have reviewed his medical, social, and family history in detail and updated the electronic medical record as necessary.    PHYSICAL EXAMINATION  BP 118/64    Pulse 79    Temp (!) 97.1 F (36.2 C)    Ht 5' 11"  (1.803 m)    Wt 229 lb 3.2 oz (104 kg)    BMI 31.97 kg/m  Wt Readings from Last 3 Encounters:  01/05/19 229 lb 3.2 oz (104 kg)  11/01/18 214 lb (97.1 kg)  10/13/18 215 lb (97.5 kg)  GEN: NAD, appears stated age, doesn't appear chronically ill PSYCH: Cooperative, without pressured speech EYE: Sclerae pale with possible developing icterus ENT: MMM, without oral ulcers, no erythema or exudates noted CV: RR without R/Gs  RESP: CTAB posteriorly, without wheezing GI: NABS, soft, protuberant abdomen, distended, negative fluid wave, negative for shifting dullness, unable to appreciate hepatosplenomegaly due to body habitus, ventral diastases present MSK/EXT: Minimal lower extremity edema bilaterally SKIN: No jaundice, no spider angiomata NEURO:  Alert & Oriented x 3, no focal deficits   REVIEW OF DATA  I reviewed the following data at the time of this encounter:  GI Procedures and Studies  August 2020 EGD - No gross lesions in proximal/middle esophagus. Grade I esophageal varices distally. - Congested, erythematous, friable (with contact bleeding), nodular and texture  changed mucosa in the cardia, gastric fundus and gastric body. Normal mucosa was found in the antrum. Biopsied for HP. - Erythematous duodenopathy in bulb and D1/D2 sweep. - No gross lesions in the second portion of the duodenum.  August 2020 Colonoscopy - Hemorrhoids found on digital rectal exam. - The examined portion of the ileum was normal. Biopsied. - Patchy moderate inflammation was found in the proximal transverse colon, in the mid transverse colon, in the ascending colon and in the cecum secondary to left-sided colitis. Biopsied to rule out chronic colitis. - Normal mucosa in the rectum, in the recto-sigmoid colon, in the sigmoid colon and in the descending colon. Biopsied. - Nine 2 to 6 mm polyps in the descending colon, in the transverse colon, at the hepatic flexure and in the ascending colon, removed with a cold snare. Resected and retrieved. - Non-bleeding non-thrombosed external and internal hemorrhoids.  Laboratory Studies  Reviewed those in epic  Imaging Studies  August 2020 Liver Doppler U/S IMPRESSION: 1. Hepatic cirrhosis with splenomegaly suggesting an element of underlying portal hypertension. 2. Portal veins remain patent with normal directional flow. 3. No discrete liver lesion or mass identified. 4. No evidence of ascites.    ASSESSMENT  Mr. Cisek is a 68 y.o. male with a pmh significant for CAD (on Plavix), diabetes, hypertension, HIV, hyperlipidemia, hemorrhoids, colon polyps, sleep apnea, Cirrhosis with manifested portal hypertension with varices, Positive HCV Antibody (negative RNA and no treatment), HBV Core Antibody positive.  The patient is seen today for evaluation and management of:  1. Cirrhosis of liver without ascites, unspecified hepatic cirrhosis type ([place])   2. Secondary esophageal varices without bleeding ([place])   3. Intestinal metaplasia of gastric mucosa   4. Hx of colonic polyp   5. Ventral hernia without obstruction or gangrene   6.  Positive hepatitis C antibody test   7. Screening for viral disease   8. Change in bowel habit   9. Hepatitis B core antibody positive    The patient is hemodynamically and clinically stable.  His most recent endoscopy verified that he does have varicose veins and thus concern  for underlying advanced fibrosis/cirrhosis is now confirmed.  He previously has had negative hep C viral loads.  He is hep B core antibody positive but hep B surface antigen negative.  He is hep A immune.  He will benefit from hep B immunization but defers on that currently.  We will consider discussing it further in the future.  Ultrasound imaging is up-to-date for [facility] screening and he will be due in February 2021 for next [facility] screening.  We will plan to consider a triple phase liver CT abdomen in 2021 to screen intermittently with a CT scan for [facility].  Patient also has elevated bilirubin but never obtained his INR.  We will plan to obtain meld labs and update accordingly.  The patient is currently on a selective beta-blocker atenolol we will reach out to his primary care provider and cardiologist to see if he may be able to be transitioned from atenolol to carvedilol due to his history of heart disease but also an effort of trying to decrease progression of his varices and minimize risk of bleeding.  We will also ask the infectious disease team to ensure there are no issues with his current HAART medication and potential transition to carvedilol.  I do not believe he is a high priority for liver transplantation as I suspect his meld is low currently but we will see how his numbers look.  He has gastric intestinal metaplasia and will require a full EGD with gastric mapping to identify overall mild in degree of intestinal metaplasia as well as complete or incomplete status.  That will be scheduled in a few months with the patient coming off Plavix as he did previously.  His prior changes in bowel habits or back to normal we will focus on  fiber supplementation for bulking as necessary.  All patient questions were answered, to the best of my ability, and the patient agrees to the aforementioned plan of action with follow-up as indicated.   PLAN  Volume -Not clear patient needs diuretics currently -Obtain standing weight 2-3 times per week (patient currently does not do at all) -If things progress we will plan on obtaining abdominal ultrasound to better clarify any evidence of ascites -1500-2000 mg Na diet Infection -No evidence of SBP as no evidence of clear ascites present Bleeding -Up-to-date for esophageal varix screening next in 2023 -Discussed possible transition of atenolol to carvedilol with PCP/cardiologist Encephalopathy -None Screening -Up-to-date, next in February 2021 Transplant -Likely low meld but will see the labs and discuss in future Vaccination -HAV immune -Needs hepatitis B vaccination/immunization but defers on it -Prevnar/Pneumovax as per PCP should be given Other -Promoted intake of 1.5 g/kg/day of Protein (Ensures/Boosts at each meal)   Laboratories as outlined below Plan for repeat EGD for gastric intestinal metaplasia mapping Continue fiber supplementation   Orders Placed This Encounter  Procedures   SARS Coronavirus 2 ([facility] ONLY)   CBC   Comp Met (CMET)   INR/PT   IgA   Tissue transglutaminase, IgA   Ambulatory referral to Gastroenterology    New Prescriptions   No medications on file   Modified Medications   No medications on file    Planned Follow Up: No follow-ups on file.   Justice Britain, MD [facility] Office # [phone]

## 2019-01-05 NOTE — Telephone Encounter (Signed)
Request for surgical clearance:     Endoscopy Procedure  What type of surgery is being performed?     EGD   When is this surgery scheduled?     01/31/19  What type of clearance is required ?   Medicine Clearance   Are there any medications that need to be held prior to surgery and how long? Plavix x5 days prior to procedure  Practice name and name of physician performing surgery?      [facility]  What is your office phone and fax number?      Phone- [phone]  [phone]  Anesthesia type (None, local, MAC, general) ?       MAC

## 2019-01-05 NOTE — Patient Instructions (Addendum)
Your provider has requested that you go to the basement level for lab work before leaving today. Press "B" on the elevator. The lab is located at the first door on the left as you exit the elevator.  If you are age 68 or older, your body mass index should be between 23-30. Your Body mass index is 31.97 kg/m. If this is out of the aforementioned range listed, please consider follow up with your Primary Care Provider.  If you are age 68 or younger, your body mass index should be between 19-25. Your Body mass index is 31.97 kg/m. If this is out of the aformentioned range listed, please consider follow up with your Primary Care Provider.   Due to recent COVID-19 restrictions implemented by [employer] and state authorities and in an effort to keep both patients and staff as safe as possible, [place] requires COVID-19 testing prior to any scheduled endoscopic procedure. The testing center is located at [address] [place], [place] [address] in the [facility] suite.  Your appointment has been scheduled for 01/27/19 on 9:30am.   Please bring your insurance cards to this appointment. You will require your COVID screen 2 business days prior to your endoscopic procedure.  You are not required to quarantine after your screening.  You will only receive a phone call with the results if it is POSITIVE.  If you do not receive a call the day before your procedure you should begin your prep, if ordered, and you should report to the endo center for your procedure at your designated appointment arrival time ( one hour prior to the procedure time). There is no cost to you for the screening on the day of the swab.  [facility] will file with your insurance company for the testing.    You may receive an automated phone call prior to your procedure or have a message in your MyChart that you have an appointment for a BP/15 at the [facility], please  disregard this message.  Your testing will be at the [address] , [facility] location.   You have been scheduled for an endoscopy. Please follow written instructions given to you at your visit today. If you use inhalers (even only as needed), please bring them with you on the day of your procedure.   I will send clearance to your primary care requesting clearance to hold your plavix for 5 days prior to your procedure.   Thank you for choosing me and [facility].  Dr. Rush Landmark

## 2019-01-06 LAB — TISSUE TRANSGLUTAMINASE, IGA: (tTG) Ab, IgA: 1 U/mL

## 2019-01-06 NOTE — Telephone Encounter (Signed)
Pt informed okay to hold plavix x5 days prior to procedure. Pt voiced understanding.

## 2019-01-08 ENCOUNTER — Encounter: Payer: Self-pay | Admitting: Gastroenterology

## 2019-01-08 DIAGNOSIS — K31A0 Gastric intestinal metaplasia, unspecified: Secondary | ICD-10-CM | POA: Insufficient documentation

## 2019-01-08 DIAGNOSIS — R768 Other specified abnormal immunological findings in serum: Secondary | ICD-10-CM

## 2019-01-08 DIAGNOSIS — I8510 Secondary esophageal varices without bleeding: Secondary | ICD-10-CM | POA: Insufficient documentation

## 2019-01-08 DIAGNOSIS — K7460 Unspecified cirrhosis of liver: Secondary | ICD-10-CM | POA: Insufficient documentation

## 2019-01-08 DIAGNOSIS — K3189 Other diseases of stomach and duodenum: Secondary | ICD-10-CM | POA: Insufficient documentation

## 2019-01-08 DIAGNOSIS — Z1159 Encounter for screening for other viral diseases: Secondary | ICD-10-CM | POA: Insufficient documentation

## 2019-01-08 HISTORY — DX: Other specified abnormal immunological findings in serum: R76.8

## 2019-01-10 ENCOUNTER — Other Ambulatory Visit: Payer: Self-pay | Admitting: Allergy and Immunology

## 2019-01-10 ENCOUNTER — Other Ambulatory Visit: Payer: Self-pay

## 2019-01-10 ENCOUNTER — Ambulatory Visit
Admission: RE | Admit: 2019-01-10 | Discharge: 2019-01-10 | Disposition: A | Discharge status: Home / Self Care | Payer: Medicare Other | Source: Ambulatory Visit | Attending: Allergy and Immunology | Admitting: Allergy and Immunology

## 2019-01-10 DIAGNOSIS — J189 Pneumonia, unspecified organism: Secondary | ICD-10-CM

## 2019-01-10 DIAGNOSIS — K7460 Unspecified cirrhosis of liver: Secondary | ICD-10-CM

## 2019-01-12 ENCOUNTER — Encounter: Payer: Self-pay | Admitting: Gastroenterology

## 2019-01-13 ENCOUNTER — Encounter: Payer: Medicare Other | Admitting: Gastroenterology

## 2019-01-16 NOTE — Progress Notes (Signed)
Cardiology Office Note   Date:  01/18/2019   ID:  Tyler Hendricks, DOB 1950-05-01, MRN [id number]  PCP:  Hoyt Koch, MD    No chief complaint on file.  CAD  Wt Readings from Last 3 Encounters:  01/18/19 228 lb 1.9 oz (103.5 kg)  01/05/19 229 lb 3.2 oz (104 kg)  11/01/18 214 lb (97.1 kg)       History of Present Illness: Tyler Hendricks is a 68 y.o. male  Who has multivessel stents placed in 2000 while in [place]. He has Ah/o HIV as well, since 1989. He is compliant with meds. Viral load is nondetectable.  OM1 3.0 x 23 mm, left posterolateral 2.5 x 13 mm, diagonal 1 2.5 x 13, ostial diagonal 2.5 mm, proximal LAD- unknown size, mid LAD 3.0 x 13 - all BMS.   Last cath was 2005. All stents were patent and there was moderate disease in the right PDA and right PLA.  In 2010, he had a monitor showing NSR with PVCs.  He has had leg cramps.   Since the last visit, he does wear a mask and stay out of crowds.    Denies : Chest pain. Dizziness. Leg edema. Nitroglycerin use. Orthopnea. Palpitations. Paroxysmal nocturnal dyspnea. Shortness of breath. Syncope.   He walks several times a day.  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   He has gained weight.  He has been eating more.      Past Medical History:  Diagnosis Date  . Abnormal result of cardiovascular function study, unspecified   . Atherosclerotic heart disease of native coronary artery without angina pectoris   . CAD (coronary artery disease)   . Coronary angioplasty status   . Diabetes mellitus without complication ([place])   . Essential (primary) hypertension   . Gallstones   . Hepatic cirrhosis ([place])   . Hepatitis C   . HIV infection ([place])   . Hyperlipidemia   . Hypertension   . Internal hemorrhoids   . Kidney stones   . Mixed hyperlipidemia   . Palpitations   . Shortness of breath   . Sleep apnea    CPAP  . Tubular adenoma of colon     Past Surgical History:   Procedure Laterality Date  . CARDIAC CATHETERIZATION Left 04/2013  . chlecystectomy    . COLONOSCOPY  05/02/2013  . PERCUTANEOUS CORONARY STENT INTERVENTION (PCI-S)    . POLYPECTOMY    . UMBILICAL HERNIA REPAIR       Current Outpatient Medications  Medication Sig Dispense Refill  . aspirin 81 MG tablet Take 81 mg by mouth daily.    Marland Kitchen losartan-hydrochlorothiazide (HYZAAR) 100-25 MG tablet Take 1 tablet by mouth daily.   3  . Multiple Vitamin (MULTIVITAMIN) tablet Take 1 tablet by mouth daily.    . rosuvastatin (CRESTOR) 10 MG tablet Take 10 mg by mouth daily.   3  . sitaGLIPtin (JANUVIA) 100 MG tablet Take 100 mg by mouth daily.    Marland Kitchen SYNJARDY XR 12.07-998 MG TB24 TK 2 TS PO QD  3   No current facility-administered medications for this visit.     Allergies:   Lopressor [metoprolol tartrate] and Integrilin [eptifibatide]    Social History:  The patient  reports that he quit smoking about 26 years ago. His smoking use included cigarettes. He has never used smokeless tobacco. He reports current alcohol use. He reports that he does not use drugs.   Family  History:  The patient's family history includes Emphysema in his mother; Esophageal cancer in his brother and brother; Heart attack (age of onset: 71) in his father; Lung cancer in his sister.    ROS:  Please see the history of present illness.   Otherwise, review of systems are positive for weight gain.   All other systems are reviewed and negative.    PHYSICAL EXAM: VS:  BP 132/84   Pulse 64   Ht 5\' 11"  (1.803 m)   Wt 228 lb 1.9 oz (103.5 kg)   SpO2 92%   BMI 31.82 kg/m  , BMI Body mass index is 31.82 kg/m. GEN: Well nourished, well developed, in no acute distress  HEENT: normal  Neck: no JVD, carotid bruits, or masses Cardiac: RRR; 2/6 systolic murmur, no rubs, or gallops,no edema  Respiratory:  clear to auscultation bilaterally, normal work of breathing GI: soft, nontender, nondistended, + BS MS: no deformity or  atrophy  Skin: warm and dry, no rash Neuro:  Strength and sensation are intact Psych: euthymic mood, full affect   EKG:   The ekg ordered today demonstrates NSR, RBBB   Recent Labs: 01/05/2019: ALT 29; BUN 22; Creatinine, Ser 0.90; Hemoglobin 15.8; Platelets 105.0; Potassium 3.8; Sodium 139   Lipid Panel    Component Value Date/Time   CHOL 149 11/09/2018 0948   TRIG 64 11/09/2018 0948   HDL 72 11/09/2018 0948   CHOLHDL 2.1 11/09/2018 0948   LDLCALC 63 11/09/2018 0948     Other studies Reviewed: Additional studies/ records that were reviewed today with results demonstrating: labs, 2018 echo revewied .   ASSESSMENT AND PLAN:  1. CAD: No angina.  Continue aggressive secondary prevention.  COuld consider stopping aspirin and continuing clopidogrel monotherapy. 2. HTN: The current medical regimen is effective;  continue present plan and medications. 3. HIV: Well controlled per his report. 4. DM: A1C 6.2 in 2019.  Cut out soda and replace with water.  Attempt to lose weight. 5. Aortic stenosis: Moderate in 2018 by echo in [place].  Mean gradient 19 mm Hg.   Current medicines are reviewed at length with the patient today.  The patient concerns regarding his medicines were addressed.  The following changes have been made:  No change  Labs/ tests ordered today include:  No orders of the defined types were placed in this encounter.   Recommend 150 minutes/week of aerobic exercise Low fat, low carb, high fiber diet recommended  Disposition:   FU in 1 year   Signed, Larae Grooms, MD  01/18/2019 8:48 AM    [facility] [place], [place], [place]  [address] Phone: [phone]; Fax: [phone]

## 2019-01-18 ENCOUNTER — Ambulatory Visit (INDEPENDENT_AMBULATORY_CARE_PROVIDER_SITE_OTHER): Payer: Medicare Other | Admitting: Interventional Cardiology

## 2019-01-18 ENCOUNTER — Other Ambulatory Visit: Payer: Self-pay

## 2019-01-18 ENCOUNTER — Encounter: Payer: Self-pay | Admitting: Interventional Cardiology

## 2019-01-18 VITALS — BP 132/84 | HR 64 | Ht 71.0 in | Wt 228.1 lb

## 2019-01-18 DIAGNOSIS — I10 Essential (primary) hypertension: Secondary | ICD-10-CM | POA: Diagnosis not present

## 2019-01-18 DIAGNOSIS — I350 Nonrheumatic aortic (valve) stenosis: Secondary | ICD-10-CM

## 2019-01-18 DIAGNOSIS — I25118 Atherosclerotic heart disease of native coronary artery with other forms of angina pectoris: Secondary | ICD-10-CM

## 2019-01-18 DIAGNOSIS — B20 Human immunodeficiency virus [HIV] disease: Secondary | ICD-10-CM

## 2019-01-18 DIAGNOSIS — E1165 Type 2 diabetes mellitus with hyperglycemia: Secondary | ICD-10-CM

## 2019-01-18 NOTE — Patient Instructions (Signed)
Medication Instructions:  Your physician recommends that you continue on your current medications as directed. Please refer to the Current Medication list given to you today.   Lab Work: None ordered  If you have labs (blood work) drawn today and your tests are completely normal, you will receive your results only by: Marland Kitchen MyChart Message (if you have MyChart) OR . A paper copy in the mail If you have any lab test that is abnormal or we need to change your treatment, we will call you to review the results.  Testing/Procedures: None ordered  Follow-Up: At [facility], you and your health needs are our priority.  As part of our continuing mission to provide you with exceptional heart care, we have created designated Provider Care Teams.  These Care Teams include your primary Cardiologist (physician) and Advanced Practice Providers (APPs -  Physician Assistants and Nurse Practitioners) who all work together to provide you with the care you need, when you need it.  Your next appointment:   12 months  The format for your next appointment:   In Person  Provider:   You may see Larae Grooms, MD or one of the following Advanced Practice Providers on your designated Care Team:    Melina Copa, PA-C  Ermalinda Barrios, PA-C   Other Instructions  Heart-Healthy Eating Plan Heart-healthy meal planning includes:  Eating less unhealthy fats.  Eating more healthy fats.  Making other changes in your diet. Talk with your doctor or a diet specialist (dietitian) to create an eating plan that is right for you. What is my plan? Your doctor may recommend an eating plan that includes:  Total fat: ______% or less of total calories a day.  Saturated fat: ______% or less of total calories a day.  Cholesterol: less than _________mg a day. What are tips for following this plan? Cooking Avoid frying your food. Try to bake, boil, grill, or broil it instead. You can also reduce fat by:  Removing  the skin from poultry.  Removing all visible fats from meats.  Steaming vegetables in water or broth. Meal planning   At meals, divide your plate into four equal parts: ? Fill one-half of your plate with vegetables and green salads. ? Fill one-fourth of your plate with whole grains. ? Fill one-fourth of your plate with lean protein foods.  Eat 4-5 servings of vegetables per day. A serving of vegetables is: ? 1 cup of raw or cooked vegetables. ? 2 cups of raw leafy greens.  Eat 4-5 servings of fruit per day. A serving of fruit is: ? 1 medium whole fruit. ?  cup of dried fruit. ?  cup of fresh, frozen, or canned fruit. ?  cup of 100% fruit juice.  Eat more foods that have soluble fiber. These are apples, broccoli, carrots, beans, peas, and barley. Try to get 20-30 g of fiber per day.  Eat 4-5 servings of nuts, legumes, and seeds per week: ? 1 serving of dried beans or legumes equals  cup after being cooked. ? 1 serving of nuts is  cup. ? 1 serving of seeds equals 1 tablespoon. General information  Eat more home-cooked food. Eat less restaurant, buffet, and fast food.  Limit or avoid alcohol.  Limit foods that are high in starch and sugar.  Avoid fried foods.  Lose weight if you are overweight.  Keep track of how much salt (sodium) you eat. This is important if you have high blood pressure. Ask your doctor to tell  you more about this.  Try to add vegetarian meals each week. Fats  Choose healthy fats. These include olive oil and canola oil, flaxseeds, walnuts, almonds, and seeds.  Eat more omega-3 fats. These include salmon, mackerel, sardines, tuna, flaxseed oil, and ground flaxseeds. Try to eat fish at least 2 times each week.  Check food labels. Avoid foods with trans fats or high amounts of saturated fat.  Limit saturated fats. ? These are often found in animal products, such as meats, butter, and cream. ? These are also found in plant foods, such as palm  oil, palm kernel oil, and coconut oil.  Avoid foods with partially hydrogenated oils in them. These have trans fats. Examples are stick margarine, some tub margarines, cookies, crackers, and other baked goods. What foods can I eat? Fruits All fresh, canned (in natural juice), or frozen fruits. Vegetables Fresh or frozen vegetables (raw, steamed, roasted, or grilled). Green salads. Grains Most grains. Choose whole wheat and whole grains most of the time. Rice and pasta, including brown rice and pastas made with whole wheat. Meats and other proteins Lean, well-trimmed beef, veal, pork, and lamb. Chicken and [place] without skin. All fish and shellfish. Wild duck, rabbit, pheasant, and venison. Egg whites or low-cholesterol egg substitutes. Dried beans, peas, lentils, and tofu. Seeds and most nuts. Dairy Low-fat or nonfat cheeses, including ricotta and mozzarella. Skim or 1% milk that is liquid, powdered, or evaporated. Buttermilk that is made with low-fat milk. Nonfat or low-fat yogurt. Fats and oils Non-hydrogenated (trans-free) margarines. Vegetable oils, including soybean, sesame, sunflower, olive, peanut, safflower, corn, canola, and cottonseed. Salad dressings or mayonnaise made with a vegetable oil. Beverages Mineral water. Coffee and tea. Diet carbonated beverages. Sweets and desserts Sherbet, gelatin, and fruit ice. Small amounts of dark chocolate. Limit all sweets and desserts. Seasonings and condiments All seasonings and condiments. The items listed above may not be a complete list of foods and drinks you can eat. Contact a dietitian for more options. What foods should I avoid? Fruits Canned fruit in heavy syrup. Fruit in cream or butter sauce. Fried fruit. Limit coconut. Vegetables Vegetables cooked in cheese, cream, or butter sauce. Fried vegetables. Grains Breads that are made with saturated or trans fats, oils, or whole milk. Croissants. Sweet rolls. Donuts. High-fat  crackers, such as cheese crackers. Meats and other proteins Fatty meats, such as hot dogs, ribs, sausage, bacon, rib-eye roast or steak. High-fat deli meats, such as salami and bologna. Caviar. Domestic duck and goose. Organ meats, such as liver. Dairy Cream, sour cream, cream cheese, and creamed cottage cheese. Whole-milk cheeses. Whole or 2% milk that is liquid, evaporated, or condensed. Whole buttermilk. Cream sauce or high-fat cheese sauce. Yogurt that is made from whole milk. Fats and oils Meat fat, or shortening. Cocoa butter, hydrogenated oils, palm oil, coconut oil, palm kernel oil. Solid fats and shortenings, including bacon fat, salt pork, lard, and butter. Nondairy cream substitutes. Salad dressings with cheese or sour cream. Beverages Regular sodas and juice drinks with added sugar. Sweets and desserts Frosting. Pudding. Cookies. Cakes. Pies. Milk chocolate or white chocolate. Buttered syrups. Full-fat ice cream or ice cream drinks. The items listed above may not be a complete list of foods and drinks to avoid. Contact a dietitian for more information. Summary  Heart-healthy meal planning includes eating less unhealthy fats, eating more healthy fats, and making other changes in your diet.  Eat a balanced diet. This includes fruits and vegetables, low-fat or nonfat dairy, lean  protein, nuts and legumes, whole grains, and heart-healthy oils and fats. This information is not intended to replace advice given to you by your health care provider. Make sure you discuss any questions you have with your health care provider. Document Released: 09/08/2011 Document Revised: 05/13/2017 Document Reviewed: 04/16/2017 Elsevier Patient Education  2020 [employer].

## 2019-01-19 MED FILL — BIKTARVY 50-200-25 MG TABS: 50-200-25 | 30 days supply | Qty: 30 | Fill #2

## 2019-01-21 NOTE — Progress Notes (Signed)
I had discussed this patient's case with his care team including cardiology and his PCP and ID.  Everyone was in agreement that we could consider transitioning the patient from his nonselective beta-blocker to carvedilol.  However, after discussing with the patient, he wanted to not change his medications because his blood pressure is finally under good control.  I have discussed with him previously that his current beta-blockade will not prevent [place] from having progression of his small varices and thus may not be as effective.  However, he does not want to change his medicines at this point in time.  There is no contraindication in the future from the use of his HAART medications or his PCP or his cardiologist to transition to carvedilol if needed in the future.  This will remain his documentation of such.  Justice Britain, MD [facility] Office # [id number]

## 2019-01-27 ENCOUNTER — Other Ambulatory Visit: Payer: Self-pay | Admitting: Gastroenterology

## 2019-01-27 DIAGNOSIS — Z1159 Encounter for screening for other viral diseases: Secondary | ICD-10-CM | POA: Diagnosis not present

## 2019-01-30 LAB — SARS CORONAVIRUS 2 (TAT 6-24 HRS): SARS Coronavirus 2: NEGATIVE

## 2019-01-31 ENCOUNTER — Other Ambulatory Visit: Payer: Self-pay

## 2019-01-31 ENCOUNTER — Encounter: Payer: Self-pay | Admitting: Gastroenterology

## 2019-01-31 ENCOUNTER — Ambulatory Visit (AMBULATORY_SURGERY_CENTER): Payer: Medicare Other | Admitting: Gastroenterology

## 2019-01-31 VITALS — BP 130/78 | HR 89 | Temp 98.2°F | Resp 13 | Ht 71.0 in | Wt 228.0 lb

## 2019-01-31 DIAGNOSIS — Z1211 Encounter for screening for malignant neoplasm of colon: Secondary | ICD-10-CM | POA: Diagnosis not present

## 2019-01-31 DIAGNOSIS — K7460 Unspecified cirrhosis of liver: Secondary | ICD-10-CM

## 2019-01-31 DIAGNOSIS — K3189 Other diseases of stomach and duodenum: Secondary | ICD-10-CM | POA: Diagnosis not present

## 2019-01-31 DIAGNOSIS — I8510 Secondary esophageal varices without bleeding: Secondary | ICD-10-CM | POA: Diagnosis not present

## 2019-01-31 DIAGNOSIS — K2950 Unspecified chronic gastritis without bleeding: Secondary | ICD-10-CM

## 2019-01-31 DIAGNOSIS — I8500 Esophageal varices without bleeding: Secondary | ICD-10-CM | POA: Diagnosis not present

## 2019-01-31 MED ORDER — SODIUM CHLORIDE 0.9 % IV SOLN: 500.0000 mL | Freq: Once | INTRAVENOUS | Status: DC

## 2019-01-31 MED ORDER — OMEPRAZOLE 20 MG PO CPDR: 20.0000 mg | DELAYED_RELEASE_CAPSULE | Freq: Every day | ORAL | 0 refills | Status: DC

## 2019-01-31 NOTE — Progress Notes (Signed)
Pt's states no medical or surgical changes since previsit or office visit.  Lc - temp Cw - vitals

## 2019-01-31 NOTE — Progress Notes (Signed)
To PACU, VSS. Report to [email or web address] 

## 2019-01-31 NOTE — Patient Instructions (Signed)
YOU HAD AN ENDOSCOPIC PROCEDURE TODAY AT [facility]:   Refer to the procedure report that was given to you for any specific questions about what was found during the examination.  If the procedure report does not answer your questions, please call your gastroenterologist to clarify.  If you requested that your care partner not be given the details of your procedure findings, then the procedure report has been included in a sealed envelope for you to review at your convenience later.  YOU SHOULD EXPECT: Some feelings of bloating in the abdomen. Passage of more gas than usual.  Walking can help get rid of the air that was put into your GI tract during the procedure and reduce the bloating. If you had a lower endoscopy (such as a colonoscopy or flexible sigmoidoscopy) you may notice spotting of blood in your stool or on the toilet paper. If you underwent a bowel prep for your procedure, you may not have a normal bowel movement for a few days.  Please Note:  You might notice some irritation and congestion in your nose or some drainage.  This is from the oxygen used during your procedure.  There is no need for concern and it should clear up in a day or so.  SYMPTOMS TO REPORT IMMEDIATELY:    Following upper endoscopy (EGD)  Vomiting of blood or coffee ground material  New chest pain or pain under the shoulder blades  Painful or persistently difficult swallowing  New shortness of breath  Fever of 100F or higher  Black, tarry-looking stools  For urgent or emergent issues, a gastroenterologist can be reached at any hour by calling [phone].   DIET:  We do recommend a small meal at first, but then you may proceed to your regular diet.  Drink plenty of fluids but you should avoid alcoholic beverages for 24 hours.  MEDICATIONS: Continue present medications. Restart Plavix in 48 hours to decrease the risk of post-intervention bleeding. Start Omeprazole 20 mg by mouth daily for 1  month, and then you can stop in order to allow healing of the stomach post biopsies.  Please see handouts given to you by your recovery nurse.  ACTIVITY:  You should plan to take it easy for the rest of today and you should NOT DRIVE or use heavy machinery until tomorrow (because of the sedation medicines used during the test).    FOLLOW UP: Our staff will call the number listed on your records 48-72 hours following your procedure to check on you and address any questions or concerns that you may have regarding the information given to you following your procedure. If we do not reach you, we will leave a message.  We will attempt to reach you two times.  During this call, we will ask if you have developed any symptoms of COVID 19. If you develop any symptoms (ie: fever, flu-like symptoms, shortness of breath, cough etc.) before then, please call [phone].  If you test positive for Covid 19 in the 2 weeks post procedure, please call and report this information to [place].    If any biopsies were taken you will be contacted by phone or by letter within the next 1-3 weeks.  Please call us at [phone] if you have not heard about the biopsies in 3 weeks.   Thank you for allowing [place] to provide for your healthcare needs today.   SIGNATURES/CONFIDENTIALITY: You and/or your care partner have signed paperwork which will be entered  into your electronic medical record.  These signatures attest to the fact that that the information above on your After Visit Summary has been reviewed and is understood.  Full responsibility of the confidentiality of this discharge information lies with you and/or your care-partner.

## 2019-01-31 NOTE — Op Note (Signed)
[facility] Patient Name: Tyler Hendricks Procedure Date: 01/31/2019 10:25 AM MRN: [id number] Endoscopist: Justice Britain , MD Age: 68 Referring MD:  Date of Birth: Jul 29, 1950 Gender: Male Account #: [id number] Procedure:                Upper GI endoscopy Indications:              Intestinal metaplasia, Follow-up of intestinal                            metaplasia Medicines:                Monitored Anesthesia Care Procedure:                Pre-Anesthesia Assessment:                           - Prior to the procedure, a History and Physical                            was performed, and patient medications and                            allergies were reviewed. The patient's tolerance of                            previous anesthesia was also reviewed. The risks                            and benefits of the procedure and the sedation                            options and risks were discussed with the patient.                            All questions were answered, and informed consent                            was obtained. Prior Anticoagulants: The patient has                            taken no previous anticoagulant or antiplatelet                            agents. ASA Grade Assessment: III - A patient with                            severe systemic disease. After reviewing the risks                            and benefits, the patient was deemed in                            satisfactory condition to undergo the procedure.  After obtaining informed consent, the endoscope was                            passed under direct vision. Throughout the                            procedure, the patient's blood pressure, pulse, and                            oxygen saturations were monitored continuously. The                            Endoscope was introduced through the mouth, and                            advanced to the second part of duodenum.  The upper                            GI endoscopy was accomplished without difficulty.                            The patient tolerated the procedure. Scope In: Scope Out: Findings:                 Grade I varices were found in the distal esophagus.                           No gross lesions were noted in the entire esophagus.                           Moderate, diffuse portal hypertensive gastropathy                            with some nodularity was found in the entire                            examined stomach. Because of history of Intestinal                            metaplasia, Gastric mapping was performed. Biopsies                            were taken with a cold forceps for histology from                            the antrum. Biopsies were taken with a cold forceps                            for histology from the incisura. Biopsies were                            taken with a cold forceps for histology from the  greater curve. Biopsies were taken with a cold                            forceps for histology from the lesser curve.                            Biopsies were taken with a cold forceps for                            histology from the fundus. Biopsies were taken with                            a cold forceps for histology from the cardia.                           No gross lesions were noted in the duodenal bulb,                            in the first portion of the duodenum and in the                            second portion of the duodenum. Complications:            No immediate complications. Estimated Blood Loss:     Estimated blood loss was minimal. Impression:               - Grade I esophageal varices.                           - No gross lesions in esophagus.                           - Portal hypertensive gastropathy. Biopsied for                            history of intestinal metaplasia as gastric mapping.                            - No gross lesions in the duodenal bulb, in the                            first portion of the duodenum and in the second                            portion of the duodenum. Recommendation:           - The patient will be observed post-procedure,                            until all discharge criteria are met.                           - Discharge patient to home.                           -  Patient has a contact number available for                            emergencies. The signs and symptoms of potential                            delayed complications were discussed with the                            patient. Return to normal activities tomorrow.                            Written discharge instructions were provided to the                            patient.                           - Resume previous diet.                           - Continue present medications.                           - Await pathology results.                           - Start Omeprazole 20 mg daily x 69-monthand then                            can stop in order to allow healing of stomach post                            biopsies.                           - Restart Plavix in 48 hours to decrease risk of                            post-intervention bleeding.                           - Repeat upper endoscopy 1-3 years for surveillance                            based on pathology results adn findings of complete                            vs incomplete vs diffuse intestinal metaplasia.                           - The findings and recommendations were discussed                            with the patient. GJustice Britain MD 01/31/2019 10:47:07 AM

## 2019-01-31 NOTE — Progress Notes (Signed)
Called to room to assist during endoscopic procedure.  Patient ID and intended procedure confirmed with present staff. Received instructions for my participation in the procedure from the performing physician.  

## 2019-02-02 ENCOUNTER — Encounter: Payer: Self-pay | Admitting: Gastroenterology

## 2019-02-02 ENCOUNTER — Telehealth: Payer: Self-pay

## 2019-02-02 NOTE — Telephone Encounter (Signed)
  Follow up Call-  Call back number 01/31/2019 11/01/2018  Post procedure Call Back phone  # [phone]  Permission to leave phone message Yes Yes  Some recent data might be hidden     Patient questions:  Do you have a fever, pain , or abdominal swelling? No. Pain Score  0 *  Have you tolerated food without any problems? Yes.    Have you been able to return to your normal activities? Yes.    Do you have any questions about your discharge instructions: Diet   No. Medications  No. Follow up visit  No.  Do you have questions or concerns about your Care? No.  Actions: * If pain score is 4 or above: 1. No action needed, pain <4.Have you developed a fever since your procedure? no  2.   Have you had an respiratory symptoms (SOB or cough) since your procedure? no  3.   Have you tested positive for COVID 19 since your procedure no  4.   Have you had any family members/close contacts diagnosed with the COVID 19 since your procedure?  no   If yes to any of these questions please route to Joylene John, RN and Alphonsa Gin, Therapist, sports.

## 2019-02-03 ENCOUNTER — Telehealth: Payer: Self-pay

## 2019-02-03 DIAGNOSIS — A048 Other specified bacterial intestinal infections: Secondary | ICD-10-CM

## 2019-02-03 NOTE — Telephone Encounter (Signed)
-----   Message from Irving Copas., MD sent at 02/02/2019  5:09 PM EST ----- Regarding: Follow-up Tyler Hendricks,Please reach out to the patient tomorrow or early next week and have him come in in the next couple of weeks for a H. pylori IgG antibody evaluation.  My letter is going to be sent by the [facility] which outlines the reasoning for the H. pylori antigen.Based on those results we will dictate potential need for H. pylori treatment versus just based on those results we will dictate potential need for H. pylori treatment versus just a 3-year follow-up endoscopy for his intestinal metaplasia.Thanks.GM

## 2019-02-03 NOTE — Telephone Encounter (Signed)
The pt has been advised and will come in next week for labs

## 2019-02-10 ENCOUNTER — Other Ambulatory Visit (INDEPENDENT_AMBULATORY_CARE_PROVIDER_SITE_OTHER): Payer: Medicare Other

## 2019-02-10 DIAGNOSIS — A048 Other specified bacterial intestinal infections: Secondary | ICD-10-CM | POA: Diagnosis not present

## 2019-02-10 DIAGNOSIS — K7460 Unspecified cirrhosis of liver: Secondary | ICD-10-CM | POA: Diagnosis not present

## 2019-02-10 LAB — HEPATIC FUNCTION PANEL
ALT: 37 U/L (ref 0–53)
AST: 63 U/L — ABNORMAL HIGH (ref 0–37)
Albumin: 3.8 g/dL (ref 3.5–5.2)
Alkaline Phosphatase: 99 U/L (ref 39–117)
Bilirubin, Direct: 0.6 mg/dL — ABNORMAL HIGH (ref 0.0–0.3)
Total Bilirubin: 3.1 mg/dL — ABNORMAL HIGH (ref 0.2–1.2)
Total Protein: 6.4 g/dL (ref 6.0–8.3)

## 2019-02-10 LAB — H. PYLORI ANTIBODY, IGG: H Pylori IgG: POSITIVE — AB

## 2019-02-10 LAB — PROTIME-INR
INR: 1.2 ratio — ABNORMAL HIGH (ref 0.8–1.0)
Prothrombin Time: 14.4 s — ABNORMAL HIGH (ref 9.6–13.1)

## 2019-02-13 ENCOUNTER — Other Ambulatory Visit: Payer: Self-pay

## 2019-02-13 MED ORDER — PYLERA 140-125-125 MG PO CAPS: 3.0000 | ORAL_CAPSULE | Freq: Three times a day (TID) | ORAL | 0 refills | Status: DC

## 2019-02-17 MED FILL — BIKTARVY 50-200-25 MG TABS: 50-200-25 | 30 days supply | Qty: 30 | Fill #3

## 2019-02-20 ENCOUNTER — Telehealth: Payer: Self-pay | Admitting: Gastroenterology

## 2019-02-20 DIAGNOSIS — A048 Other specified bacterial intestinal infections: Secondary | ICD-10-CM

## 2019-02-20 NOTE — Telephone Encounter (Signed)
Left message on machine to call back  

## 2019-02-20 NOTE — Telephone Encounter (Signed)
The pt took 3 days of Pylera but stopped due to the diarrhea. 4-5 watery episodes daily.  He wants to know what else he can do.  He says he just cant take these medications. Please advise

## 2019-02-20 NOTE — Telephone Encounter (Signed)
Let him go ahead and stop the medications for the next few days. With time I suspect that he will go back to his normal self. Plan on checking in on him by Wednesday. If he needs an anti-diarrheal in the next few days that is OK. If things persists for the rest of this week, he will need formal stool studies. I think it is OK in a couple of weeks to perform a Stool H. Pylori Antigen in the coming weeks (while on PPI) to see if anything returns positive (most likely would not since all his biopsies were negative), and if that is the case then no need to try to treat HP again. Thanks. GM

## 2019-02-20 NOTE — Telephone Encounter (Signed)
The pt was advised and will come in 2 weeks for stool test.  He says he has already gotten much better and has no diarrhea since stopping the pylera.  Stool test in Epic

## 2019-02-27 ENCOUNTER — Other Ambulatory Visit: Payer: Self-pay | Admitting: Gastroenterology

## 2019-03-02 ENCOUNTER — Ambulatory Visit (INDEPENDENT_AMBULATORY_CARE_PROVIDER_SITE_OTHER): Payer: Medicare Other | Admitting: Internal Medicine

## 2019-03-02 ENCOUNTER — Other Ambulatory Visit: Payer: Self-pay

## 2019-03-02 ENCOUNTER — Ambulatory Visit (INDEPENDENT_AMBULATORY_CARE_PROVIDER_SITE_OTHER)
Admission: RE | Admit: 2019-03-02 | Discharge: 2019-03-02 | Disposition: A | Discharge status: Home / Self Care | Payer: Medicare Other | Source: Ambulatory Visit | Attending: Internal Medicine | Admitting: Internal Medicine

## 2019-03-02 ENCOUNTER — Other Ambulatory Visit (INDEPENDENT_AMBULATORY_CARE_PROVIDER_SITE_OTHER): Payer: Medicare Other

## 2019-03-02 ENCOUNTER — Encounter: Payer: Self-pay | Admitting: Internal Medicine

## 2019-03-02 ENCOUNTER — Other Ambulatory Visit: Payer: Self-pay | Admitting: Gastroenterology

## 2019-03-02 VITALS — BP 100/62 | HR 63 | Temp 98.3°F | Ht 71.0 in | Wt 217.0 lb

## 2019-03-02 DIAGNOSIS — K7460 Unspecified cirrhosis of liver: Secondary | ICD-10-CM

## 2019-03-02 DIAGNOSIS — M545 Low back pain, unspecified: Secondary | ICD-10-CM

## 2019-03-02 DIAGNOSIS — G8929 Other chronic pain: Secondary | ICD-10-CM

## 2019-03-02 DIAGNOSIS — R05 Cough: Secondary | ICD-10-CM | POA: Diagnosis not present

## 2019-03-02 DIAGNOSIS — R252 Cramp and spasm: Secondary | ICD-10-CM

## 2019-03-02 DIAGNOSIS — R059 Cough, unspecified: Secondary | ICD-10-CM

## 2019-03-02 DIAGNOSIS — I25118 Atherosclerotic heart disease of native coronary artery with other forms of angina pectoris: Secondary | ICD-10-CM

## 2019-03-02 LAB — COMPREHENSIVE METABOLIC PANEL
ALT: 47 U/L (ref 0–53)
AST: 62 U/L — ABNORMAL HIGH (ref 0–37)
Albumin: 4 g/dL (ref 3.5–5.2)
Alkaline Phosphatase: 91 U/L (ref 39–117)
BUN: 15 mg/dL (ref 6–23)
CO2: 22 mEq/L (ref 19–32)
Calcium: 9.5 mg/dL (ref 8.4–10.5)
Chloride: 105 mEq/L (ref 96–112)
Creatinine, Ser: 0.68 mg/dL (ref 0.40–1.50)
GFR: 115.81 mL/min (ref >60.00)
Glucose, Bld: 112 mg/dL — ABNORMAL HIGH (ref 70–99)
Potassium: 3.6 mEq/L (ref 3.5–5.1)
Sodium: 137 mEq/L (ref 135–145)
Total Bilirubin: 2.6 mg/dL — ABNORMAL HIGH (ref 0.2–1.2)
Total Protein: 7.2 g/dL (ref 6.0–8.3)

## 2019-03-02 LAB — CBC
HCT: 48.1 % (ref 39.0–52.0)
Hemoglobin: 16.8 g/dL (ref 13.0–17.0)
MCHC: 34.8 g/dL (ref 30.0–36.0)
MCV: 95.3 fl (ref 78.0–100.0)
Platelets: 105 10*3/uL — ABNORMAL LOW (ref 150.0–400.0)
RBC: 5.05 Mil/uL (ref 4.22–5.81)
RDW: 14.1 % (ref 11.5–15.5)
WBC: 3.7 10*3/uL — ABNORMAL LOW (ref 4.0–10.5)

## 2019-03-02 LAB — MAGNESIUM: Magnesium: 1.8 mg/dL (ref 1.5–2.5)

## 2019-03-02 MED ORDER — METHOCARBAMOL 500 MG PO TABS: 500.0000 mg | ORAL_TABLET | Freq: Two times a day (BID) | ORAL | 2 refills | Status: DC | PRN

## 2019-03-02 NOTE — Assessment & Plan Note (Signed)
Checking CMP, magnesium. Adjust as needed.

## 2019-03-02 NOTE — Assessment & Plan Note (Signed)
Ordered x-ray lumbar spine. Rx robaxin and advised to start turmeric otc. Checking ANA/RF with reflex to rule out alternate diagnosis given acute change in joint pain diffuse.

## 2019-03-02 NOTE — Progress Notes (Signed)
   Subjective:   Patient ID: Tyler Hendricks, male    DOB: 1950-07-30, 68 y.o.   MRN: [id number]  HPI The patient is a 68 YO man coming in for cramps in the muscles (happen more often in the last month, previously rare, severe 10/10 pain with onset, last 10-30 minutes, denies trying anything which has helped) and cough chronic (has seen allergist and they think it is allergies, he is taking the medications which make no difference, using tessalon perles and promethazine cough syrup which does not help that much, hits him in spells, prior abnormal CXR with repeat normal, has cirrhosis but no ascites, some elevated pressure due to this, denies fevers or chills, denies change in cough recently, covid-19 test in November for procedure which was negative, does not go out of home) and pain in the low back (started some time ago, worse in the last several weeks, denies radiation of the pain, has not really taken anything for this, denies fall or injury recently, had old injury in childhood).   Review of Systems  Constitutional: Negative.   HENT: Negative.   Eyes: Negative.   Respiratory: Positive for cough. Negative for choking, chest tightness and shortness of breath.   Cardiovascular: Negative for chest pain, palpitations and leg swelling.  Gastrointestinal: Positive for abdominal distention. Negative for abdominal pain, anal bleeding, blood in stool, constipation, diarrhea, nausea, rectal pain and vomiting.  Musculoskeletal: Positive for arthralgias, back pain and myalgias. Negative for gait problem, joint swelling, neck pain and neck stiffness.  Skin: Negative.   Neurological: Negative.   Psychiatric/Behavioral: Negative.     Objective:  Physical Exam Constitutional:      Appearance: He is well-developed. He is obese.  HENT:     Head: Normocephalic and atraumatic.  Cardiovascular:     Rate and Rhythm: Normal rate and regular rhythm.  Pulmonary:     Effort: Pulmonary effort is normal. No  respiratory distress.     Breath sounds: Normal breath sounds. No wheezing or rales.  Abdominal:     General: Bowel sounds are normal. There is distension.     Palpations: Abdomen is soft. There is no mass.     Tenderness: There is no abdominal tenderness. There is no guarding or rebound.     Comments: No fluid wave  Musculoskeletal:        General: Tenderness present. No swelling or deformity.     Cervical back: Normal range of motion.  Skin:    General: Skin is warm and dry.  Neurological:     Mental Status: He is alert and oriented to person, place, and time.     Coordination: Coordination normal.     Vitals:   03/02/19 0925  BP: 100/62  Pulse: 63  Temp: 98.3 F (36.8 C)  TempSrc: Oral  SpO2: 98%  Weight: 217 lb (98.4 kg)  Height: 5\' 11"  (1.803 m)    This visit occurred during the SARS-CoV-2 public health emergency.  Safety protocols were in place, including screening questions prior to the visit, additional usage of staff PPE, and extensive cleaning of exam room while observing appropriate contact time as indicated for disinfecting solutions.   Assessment & Plan:

## 2019-03-02 NOTE — Patient Instructions (Addendum)
We have sent in a muscle relaxer that you can try.   I would recommend to take turmeric over the counter twice a day to help with the joints.   We are checking the labs today to see what is causing the joints to hurt.   We are getting the scan of the lungs and the x-ray of the back.

## 2019-03-02 NOTE — Assessment & Plan Note (Signed)
Checking CT chest due to prior equivocal imaging CXR. Using tessalon perles, allergy medication and promethazine cough syrup.

## 2019-03-03 ENCOUNTER — Ambulatory Visit
Admission: RE | Admit: 2019-03-03 | Discharge: 2019-03-03 | Disposition: A | Discharge status: Home / Self Care | Payer: Medicare Other | Source: Ambulatory Visit | Attending: Internal Medicine | Admitting: Internal Medicine

## 2019-03-03 ENCOUNTER — Other Ambulatory Visit: Payer: Medicare Other

## 2019-03-03 DIAGNOSIS — R05 Cough: Secondary | ICD-10-CM | POA: Diagnosis not present

## 2019-03-03 DIAGNOSIS — A048 Other specified bacterial intestinal infections: Secondary | ICD-10-CM

## 2019-03-03 DIAGNOSIS — R059 Cough, unspecified: Secondary | ICD-10-CM

## 2019-03-05 ENCOUNTER — Other Ambulatory Visit: Payer: Self-pay | Admitting: Internal Medicine

## 2019-03-05 LAB — ANTI-NUCLEAR AB-TITER (ANA TITER): ANA Titer 1: 1:40 {titer} — ABNORMAL HIGH

## 2019-03-05 LAB — ANA,IFA RA DIAG PNL W/RFLX TIT/PATN
Anti Nuclear Antibody (ANA): POSITIVE — AB
Cyclic Citrullin Peptide Ab: <16 UNITS
Rheumatoid fact SerPl-aCnc: <14 IU/mL (ref <14)

## 2019-03-06 ENCOUNTER — Encounter: Payer: Self-pay | Admitting: Internal Medicine

## 2019-03-06 ENCOUNTER — Other Ambulatory Visit: Payer: Self-pay | Admitting: Internal Medicine

## 2019-03-06 DIAGNOSIS — R059 Cough, unspecified: Secondary | ICD-10-CM

## 2019-03-06 LAB — HELICOBACTER PYLORI  SPECIAL ANTIGEN
MICRO NUMBER:: 1189561
SPECIMEN QUALITY: ADEQUATE

## 2019-03-07 DIAGNOSIS — Z20828 Contact with and (suspected) exposure to other viral communicable diseases: Secondary | ICD-10-CM | POA: Diagnosis not present

## 2019-03-07 MED ORDER — BENZONATATE 200 MG PO CAPS: 200.0000 mg | ORAL_CAPSULE | Freq: Three times a day (TID) | ORAL | 0 refills | Status: DC | PRN

## 2019-03-07 NOTE — Telephone Encounter (Signed)
Patient would like some medication for the terrible cough he is still having.  Stated he will also check his My Chart.

## 2019-03-07 NOTE — Addendum Note (Signed)
Addended by: Pricilla Holm A on: 03/07/2019 01:50 PM   Modules accepted: Orders

## 2019-03-20 ENCOUNTER — Encounter: Payer: Self-pay | Admitting: Internal Medicine

## 2019-03-21 MED FILL — BIKTARVY 50-200-25 MG TABS: 50-200-25 | 30 days supply | Qty: 30 | Fill #4

## 2019-04-03 ENCOUNTER — Institutional Professional Consult (permissible substitution): Payer: Medicare Other | Admitting: Pulmonary Disease

## 2019-04-07 ENCOUNTER — Encounter: Payer: Self-pay | Admitting: Internal Medicine

## 2019-04-12 ENCOUNTER — Encounter: Payer: Self-pay | Admitting: Pulmonary Disease

## 2019-04-12 ENCOUNTER — Other Ambulatory Visit: Payer: Self-pay

## 2019-04-12 ENCOUNTER — Ambulatory Visit (INDEPENDENT_AMBULATORY_CARE_PROVIDER_SITE_OTHER): Payer: Medicare Other | Admitting: Pulmonary Disease

## 2019-04-12 VITALS — BP 104/70 | HR 70 | Temp 98.3°F | Ht 71.0 in | Wt 222.4 lb

## 2019-04-12 DIAGNOSIS — R05 Cough: Secondary | ICD-10-CM

## 2019-04-12 DIAGNOSIS — R053 Chronic cough: Secondary | ICD-10-CM

## 2019-04-12 DIAGNOSIS — R9389 Abnormal findings on diagnostic imaging of other specified body structures: Secondary | ICD-10-CM | POA: Diagnosis not present

## 2019-04-12 MED ORDER — HYDROCODONE-HOMATROPINE 5-1.5 MG/5ML PO SYRP: 5.0000 mL | ORAL_SOLUTION | Freq: Four times a day (QID) | ORAL | 0 refills | Status: DC | PRN

## 2019-04-12 NOTE — Patient Instructions (Addendum)
Chronic cough --We will arrange for pulmonary function test --Hycodan syrup ordered for cough  Severe OSA on CPAP --Continue non-invasive ventilation nightly --Avoid driving when sleepy

## 2019-04-12 NOTE — Progress Notes (Signed)
Subjective:   PATIENT ID: Tyler Hendricks GENDER: male DOB: 06-30-1950, MRN: [id number]   HPI  Chief Complaint  Patient presents with  . Consult    Patient is here for a consult for a cough that been going on for almost a year. Patient was also diagnosed with Covid 02/25/19.     Reason for Visit: New consult for cough  Tyler Hendricks is a  69 year old male former smoker with history COVID in 02/2019 who presents for chronic cough.  He has had a chronic nonproductive cough that began 1.5 years ago that sporadically occurs throughout the day. Seems to be associated with inner chest soreness or sometimes will start as an irritation. He has tried a nebulizer. He has seen an allergist who attributes symptoms to mold and hair after performing skin allergy test. Cough improves with cough syrup and perles but recurs when stopping. Going outdoors and laying down on his back will aggravate his cough but is able to lay on his right side. Is not aware of any other triggers. He is currently taking Symbicort twice a day which was started a month ago and does not feel like this helps. Denies allergies, nasal congestion or reflux.  Social History: Former smoker. Reports 2ppd x 15 years. Quit in 1994.   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  Past Medical History:  Diagnosis Date  . Abnormal result of cardiovascular function study, unspecified   . Atherosclerotic heart disease of native coronary artery without angina pectoris   . CAD (coronary artery disease)   . Coronary angioplasty status   . Diabetes mellitus without complication ([place])   . Essential (primary) hypertension   . Gallstones   . Hepatic cirrhosis ([place])   . Hepatitis C   . HIV infection ([place])   . Hyperlipidemia   . Hypertension   . Internal hemorrhoids   .  Kidney stones   . Mixed hyperlipidemia   . Palpitations   . Shortness of breath   . Sleep apnea    CPAP  . Tubular adenoma of colon      Family History  Problem Relation Age of Onset  . Emphysema Mother        pulmonary  . Heart attack Father 48  . Lung cancer Sister   . Esophageal cancer Brother   . Esophageal cancer Brother   . Colon cancer Neg Hx   . Inflammatory bowel disease Neg Hx   . Liver disease Neg Hx   . Pancreatic cancer Neg Hx   . Rectal cancer Neg Hx   . Stomach cancer Neg Hx   . Colon polyps Neg Hx      Social History   Occupational History  . Occupation: retired  Tobacco Use  . Smoking status: Former Smoker    Types: Cigarettes    Quit date: 1994    Years since quitting: 27.0  . Smokeless tobacco: Never Used  Substance and Sexual Activity  . Alcohol use: Yes    Comment: social-occ beer  . Drug use: No  . Sexual activity: Yes    Allergies  Allergen Reactions  . Lopressor [Metoprolol Tartrate] Rash and Other (See Comments)    Bleeding (non-specific)   . Integrilin [Eptifibatide] Other (See Comments)    Bleeding (non-specific)     Outpatient Medications Prior to Visit  Medication Sig  Dispense Refill  . aspirin 81 MG tablet Take 81 mg by mouth daily.    . benzonatate (TESSALON) 200 MG capsule Take 1 capsule (200 mg total) by mouth 3 (three) times daily as needed. 60 capsule 0  . budesonide-formoterol (SYMBICORT) 80-4.5 MCG/ACT inhaler     . clopidogrel (PLAVIX) 75 MG tablet TAKE 1 TABLET(75 MG) BY MOUTH DAILY 90 tablet 3  . losartan-hydrochlorothiazide (HYZAAR) 100-25 MG tablet Take 1 tablet by mouth daily.   3  . methocarbamol (ROBAXIN) 500 MG tablet Take 1 tablet (500 mg total) by mouth 2 (two) times daily as needed for muscle spasms. 60 tablet 2  . Multiple Vitamin (MULTIVITAMIN) tablet Take 1 tablet by mouth daily.    Marland Kitchen omeprazole (PRILOSEC) 20 MG capsule TAKE 1 CAPSULE(20 MG) BY MOUTH DAILY 30 capsule 0  . rosuvastatin (CRESTOR) 10 MG  tablet Take 10 mg by mouth daily.   3  . sitaGLIPtin (JANUVIA) 100 MG tablet Take 100 mg by mouth daily.    Marland Kitchen SYNJARDY XR 12.07-998 MG TB24 TK 2 TS PO QD  3   No facility-administered medications prior to visit.    Review of Systems  Constitutional: Negative for chills, diaphoresis, fever, malaise/fatigue and weight loss.  HENT: Negative for congestion, ear pain and sore throat.   Respiratory: Positive for cough and sputum production (Sometimes). Negative for hemoptysis, shortness of breath and wheezing.   Cardiovascular: Negative for chest pain, palpitations and leg swelling.  Gastrointestinal: Negative for abdominal pain, heartburn and nausea.  Genitourinary: Negative for frequency.  Musculoskeletal: Negative for joint pain and myalgias.  Skin: Negative for itching and rash.  Neurological: Negative for dizziness, weakness and headaches.  Endo/Heme/Allergies: Does not bruise/bleed easily.  Psychiatric/Behavioral: Negative for depression. The patient is not nervous/anxious.   All other systems reviewed and are negative.    Objective:   Vitals:   04/12/19 1539  BP: 104/70  Pulse: 70  Temp: 98.3 F (36.8 C)  TempSrc: Temporal  SpO2: 94%  Weight: 222 lb 6.4 oz (100.9 kg)  Height: 5\' 11"  (1.803 m)      Physical Exam: General: Well-appearing, no acute distress HENT: , AT Eyes: EOMI, no scleral icterus Respiratory: Bibasilar crackles R>L Cardiovascular: RRR, -M/R/G, no JVD GI: BS+, soft, nontender Extremities:-Edema,-tenderness Neuro: AAO x4, CNII-XII grossly intact Skin: Intact, no rashes or bruising Psych: Normal mood, normal affect  Data Reviewed:  Imaging: CT Chest 03/03/19 -  Subpleural ground glass opacities and reticular changes without honeycombing.  PFT: None on file    Assessment & Plan:   Discussion: 69 year old male former smoker with history COVID in 02/2019 who presents for chronic cough. CT chest reviewed with patient and wife on phone.  Subpleural ground glass opacities and reticular changes without honeycombing. This may represent early ILD vs post-viral changes related to COVID-19. Will obtain PFTs to rule out restrictive defect and/or reduced DLCO. Will manage cough with syrup and over-the-counters for now.  Chronic cough --We will arrange for pulmonary function test --Hycodan syrup ordered for cough --Pending PFTs, will order high-resolution CT Chest  Severe OSA on CPAP --Continue non-invasive ventilation nightly --Avoid driving when sleepy  Health Maintenance Immunization History  Administered Date(s) Administered  . Influenza, High Dose Seasonal PF 12/20/2017, 11/17/2018  . Tdap 09/07/2018  . Zoster Recombinat (Shingrix) 01/06/2017   CT Lung Screen - not qualified  No orders of the defined types were placed in this encounter.  Meds ordered this encounter  Medications  . HYDROcodone-homatropine (HYCODAN)  5-1.5 MG/5ML syrup    Sig: Take 5 mLs by mouth every 6 (six) hours as needed for cough.    Dispense:  473 mL    Refill:  0   Return Follow-up after PFTs with me.  I have spent a total time of 46-minutes on the day of the appointment reviewing prior documentation, coordinating care and discussing medical diagnosis and plan with the patient/family. Imaging, labs and tests included in this note have been reviewed and interpreted independently by me.  [place], MD [facility] 04/12/2019 1:34 PM  Office Number [phone]

## 2019-04-19 MED FILL — BIKTARVY 50-200-25 MG TABS: 50-200-25 | 30 days supply | Qty: 30 | Fill #5

## 2019-04-26 ENCOUNTER — Other Ambulatory Visit: Payer: Self-pay

## 2019-04-26 ENCOUNTER — Ambulatory Visit (INDEPENDENT_AMBULATORY_CARE_PROVIDER_SITE_OTHER): Payer: Medicare Other | Admitting: Adult Health

## 2019-04-26 ENCOUNTER — Encounter: Payer: Self-pay | Admitting: Adult Health

## 2019-04-26 VITALS — BP 124/78 | HR 61 | Temp 97.4°F | Ht 71.0 in | Wt 225.8 lb

## 2019-04-26 DIAGNOSIS — G4733 Obstructive sleep apnea (adult) (pediatric): Secondary | ICD-10-CM

## 2019-04-26 DIAGNOSIS — Z9989 Dependence on other enabling machines and devices: Secondary | ICD-10-CM

## 2019-04-26 NOTE — Progress Notes (Signed)
PATIENT: Tyler Hendricks DOB: Sep 26, 1950  REASON FOR VISIT: follow up HISTORY FROM: patient  HISTORY OF PRESENT ILLNESS: Today 04/26/19:  Tyler Hendricks is a 69 year old male with a history of obstructive sleep apnea on CPAP.  His download indicates that he uses machine 21 out of 30 days for compliance of 70%.  He uses machine greater than 4 hours 11 days for compliance of 37%.  On average he uses his machine 4 hours and 22 minutes.  His residual AHI is 3.6 on 7 to 15 cm of water with EPR of 2.  His leak in the 95th percentile is 9.6 L/min.  He reports that there are some nights he gets up to the bathroom and does not put his machine back on.  He still feels that the CPAP works well for him.  His Epworth sleepiness score is 610 fatigue severity score is 13.  HISTORY  10/13/18: Tyler Hendricks is a 69 year old male with a history of obstructive sleep apnea on CPAP.  He returns today for follow-up.  His download indicates that he use his machine 23 out of 30 days for compliance of 77%.  He uses machine greater than 4 hours 11 days for compliance of 37%.  On average he uses his machine 3 hours and 56 minutes.  His residual AHI is 4.2 on 7 to 15 cm of water with EPR of 2.  His leak in the 95th percentile is 9.8.  He reports that some nights he does not get more than 4 hours of sleep.  He states that he is getting more used to the mask.  His Epworth sleepiness score is 2 and fatigue severity score is 16.  He returns today for follow-up.  REVIEW OF SYSTEMS: Out of a complete 14 system review of symptoms, the patient complains only of the following symptoms, and all other reviewed systems are negative.  See HPI  ALLERGIES: Allergies  Allergen Reactions  . Lopressor [Metoprolol Tartrate] Rash and Other (See Comments)    Bleeding (non-specific)   . Integrilin [Eptifibatide] Other (See Comments)    Bleeding (non-specific)    HOME MEDICATIONS: Outpatient Medications Prior to Visit  Medication Sig Dispense  Refill  . amLODipine (NORVASC) 2.5 MG tablet Take 2.5 mg by mouth daily.    Marland Kitchen aspirin 81 MG tablet Take 81 mg by mouth daily.    Marland Kitchen atenolol (TENORMIN) 50 MG tablet Take 50 mg by mouth daily.    . clopidogrel (PLAVIX) 75 MG tablet TAKE 1 TABLET(75 MG) BY MOUTH DAILY 90 tablet 3  . HYDROcodone-homatropine (HYCODAN) 5-1.5 MG/5ML syrup Take 5 mLs by mouth every 6 (six) hours as needed for cough. 473 mL 0  . losartan-hydrochlorothiazide (HYZAAR) 100-25 MG tablet Take 1 tablet by mouth daily.   3  . methocarbamol (ROBAXIN) 500 MG tablet Take 1 tablet (500 mg total) by mouth 2 (two) times daily as needed for muscle spasms. 60 tablet 2  . Multiple Vitamin (MULTIVITAMIN) tablet Take 1 tablet by mouth daily.    . rosuvastatin (CRESTOR) 10 MG tablet Take 10 mg by mouth daily.   3  . sitaGLIPtin (JANUVIA) 100 MG tablet Take 100 mg by mouth daily.    Marland Kitchen SYNJARDY XR 12.07-998 MG TB24 TK 2 TS PO QD  3  . benzonatate (TESSALON) 200 MG capsule Take 1 capsule (200 mg total) by mouth 3 (three) times daily as needed. (Patient not taking: Reported on 04/12/2019) 60 capsule 0  . budesonide-formoterol (SYMBICORT) 80-4.5 MCG/ACT inhaler     .  omeprazole (PRILOSEC) 20 MG capsule TAKE 1 CAPSULE(20 MG) BY MOUTH DAILY 30 capsule 0   No facility-administered medications prior to visit.    PAST MEDICAL HISTORY: Past Medical History:  Diagnosis Date  . Abnormal result of cardiovascular function study, unspecified   . Atherosclerotic heart disease of native coronary artery without angina pectoris   . CAD (coronary artery disease)   . Coronary angioplasty status   . Diabetes mellitus without complication ([place])   . Essential (primary) hypertension   . Gallstones   . Hepatic cirrhosis ([place])   . Hepatitis C   . HIV infection ([place])   . Hyperlipidemia   . Hypertension   . Internal hemorrhoids   . Kidney stones   . Mixed hyperlipidemia   . Palpitations   . Shortness of breath   . Sleep apnea    CPAP  . Tubular  adenoma of colon     PAST SURGICAL HISTORY: Past Surgical History:  Procedure Laterality Date  . CARDIAC CATHETERIZATION Left 04/2013  . chlecystectomy    . COLONOSCOPY  05/02/2013  . PERCUTANEOUS CORONARY STENT INTERVENTION (PCI-S)    . POLYPECTOMY    . UMBILICAL HERNIA REPAIR      FAMILY HISTORY: Family History  Problem Relation Age of Onset  . Emphysema Mother        pulmonary  . Heart attack Father 63  . Lung cancer Sister   . Esophageal cancer Brother   . Esophageal cancer Brother   . Colon cancer Neg Hx   . Inflammatory bowel disease Neg Hx   . Liver disease Neg Hx   . Pancreatic cancer Neg Hx   . Rectal cancer Neg Hx   . Stomach cancer Neg Hx   . Colon polyps Neg Hx     SOCIAL HISTORY: Social History   Socioeconomic History  . Marital status: Married    Spouse name: Not on file  . Number of children: 1  . Years of education: Not on file  . Highest education level: 10th grade  Occupational History  . Occupation: retired  Tobacco Use  . Smoking status: Former Smoker    Types: Cigarettes    Quit date: 1994    Years since quitting: 27.1  . Smokeless tobacco: Never Used  Substance and Sexual Activity  . Alcohol use: Yes    Comment: social-occ beer  . Drug use: No  . Sexual activity: Yes  Other Topics Concern  . Not on file  Social History Narrative  . Not on file   Social Determinants of Health   Financial Resource Strain:   . Difficulty of Paying Living Expenses: Not on file  Food Insecurity:   . Worried About Charity fundraiser in the Last Year: Not on file  . Ran Out of Food in the Last Year: Not on file  Transportation Needs:   . Lack of Transportation (Medical): Not on file  . Lack of Transportation (Non-Medical): Not on file  Physical Activity:   . Days of Exercise per Week: Not on file  . Minutes of Exercise per Session: Not on file  Stress:   . Feeling of Stress : Not on file  Social Connections:   . Frequency of Communication with  Friends and Family: Not on file  . Frequency of Social Gatherings with Friends and Family: Not on file  . Attends Religious Services: Not on file  . Active Member of Clubs or Organizations: Not on file  . Attends Archivist  Meetings: Not on file  . Marital Status: Not on file  Intimate Partner Violence:   . Fear of Current or Ex-Partner: Not on file  . Emotionally Abused: Not on file  . Physically Abused: Not on file  . Sexually Abused: Not on file      PHYSICAL EXAM  Vitals:   04/26/19 0825  BP: 124/78  Pulse: 61  Temp: (!) 97.4 F (36.3 C)  TempSrc: Oral  Weight: 225 lb 12.8 oz (102.4 kg)  Height: 5\' 11"  (1.803 m)   Body mass index is 31.49 kg/m. Generalized: Well developed, in no acute distress  Chest: Lungs clear to auscultation bilaterally  Neurological examination  Mentation: Alert oriented to time, place, history taking. Follows all commands speech and language fluent Cranial nerve II-XII: Extraocular movements were full, visual field were full on confrontational test Head turning and shoulder shrug  were normal and symmetric. Motor: The motor testing reveals 5 over 5 strength of all 4 extremities. Good symmetric motor tone is noted throughout.  Sensory: Sensory testing is intact to soft touch on all 4 extremities. No evidence of extinction is noted.  Gait and station: Gait is normal.    DIAGNOSTIC DATA (LABS, IMAGING, TESTING) - I reviewed patient records, labs, notes, testing and imaging myself where available.  Lab Results  Component Value Date   WBC 3.7 (L) 03/02/2019   HGB 16.8 03/02/2019   HCT 48.1 03/02/2019   MCV 95.3 03/02/2019   PLT 105.0 (L) 03/02/2019      Component Value Date/Time   NA 137 03/02/2019 1009   K 3.6 03/02/2019 1009   CL 105 03/02/2019 1009   CO2 22 03/02/2019 1009   GLUCOSE 112 (H) 03/02/2019 1009   BUN 15 03/02/2019 1009   CREATININE 0.68 03/02/2019 1009   CREATININE 0.63 (L) 11/09/2018 0948   CALCIUM 9.5  03/02/2019 1009   PROT 7.2 03/02/2019 1009   ALBUMIN 4.0 03/02/2019 1009   AST 62 (H) 03/02/2019 1009   ALT 47 03/02/2019 1009   ALKPHOS 91 03/02/2019 1009   BILITOT 2.6 (H) 03/02/2019 1009   GFRNONAA 98 04/02/2017 1058   GFRAA 114 04/02/2017 1058   Lab Results  Component Value Date   CHOL 149 11/09/2018   HDL 72 11/09/2018   LDLCALC 63 11/09/2018   TRIG 64 11/09/2018   CHOLHDL 2.1 11/09/2018   Lab Results  Component Value Date   HGBA1C 6.2 02/25/2018   Lab Results  Component Value Date   VITAMINB12 536 08/25/2017   No results found for: TSH    ASSESSMENT AND PLAN 69 y.o. year old male  has a past medical history of Abnormal result of cardiovascular function study, unspecified, Atherosclerotic heart disease of native coronary artery without angina pectoris, CAD (coronary artery disease), Coronary angioplasty status, Diabetes mellitus without complication ([place]), Essential (primary) hypertension, Gallstones, Hepatic cirrhosis ([place]), Hepatitis C, HIV infection ([place]), Hyperlipidemia, Hypertension, Internal hemorrhoids, Kidney stones, Mixed hyperlipidemia, Palpitations, Shortness of breath, Sleep apnea, and Tubular adenoma of colon. here with:  1. Obstructive sleep apnea on CPAP  The patient's CPAP download shows suboptimal compliance but good treatment of his apnea.  He is encouraged to continue using CPAP nightly and greater than 4 hours each night.  He is advised that if his symptoms worsen or he develops new symptoms he should let us know.  He will follow-up in 1 year or sooner if needed    I spent 15 minutes with the patient. 50% of this time was spent  reviewing CPAP download   Ward Givens, MSN, NP-C 04/26/2019, 8:40 AM [facility] [address], [place], [place] [address] [phone]

## 2019-04-26 NOTE — Patient Instructions (Signed)

## 2019-04-27 ENCOUNTER — Telehealth: Payer: Self-pay | Admitting: Pulmonary Disease

## 2019-04-27 DIAGNOSIS — R05 Cough: Secondary | ICD-10-CM

## 2019-04-27 DIAGNOSIS — R053 Chronic cough: Secondary | ICD-10-CM

## 2019-04-27 NOTE — Telephone Encounter (Signed)
Called and spoke with pt who stated his cough came back yesterday. Pt stated he did finish the hycodan cough med today but due to the cough coming back, pt stated he would like to have a refill of the Rx. Pt stated at times he will have a coughing attack and when he has one, he will take the hycodan to help with it.  Dr. Loanne Drilling, please advise if you are okay refilling pt's hycodan sending the Rx to [facility] off [address] for pt?

## 2019-04-28 MED ORDER — HYDROCODONE-HOMATROPINE 5-1.5 MG/5ML PO SYRP: 5.0000 mL | ORAL_SOLUTION | Freq: Four times a day (QID) | ORAL | 0 refills | Status: DC | PRN

## 2019-04-28 NOTE — Telephone Encounter (Signed)
Refilled. Please ensure patient has PFTs when next available.

## 2019-04-28 NOTE — Telephone Encounter (Signed)
Order has been placed for PFT.   Left message for patient to call back.

## 2019-04-28 NOTE — Telephone Encounter (Signed)
Pt calling back to check on the status of the refill he requested. pt can be reached at  [phone]

## 2019-04-28 NOTE — Telephone Encounter (Signed)
Spoke with pt, advised that we were waiting on Dr. Cordelia Pen recs for refill, and that we would call him when she responded to this request.  Pt expressed understanding.  rx is pended in this phone note as previously prescribed.   Dr. Loanne Drilling please advise on refill request.  Thanks!

## 2019-05-01 NOTE — Telephone Encounter (Signed)
Spoke with pt. He is aware that his medication has been refilled. PFT has been scheduled for 05/31/19 at 1600, COVID test is scheduled for 05/27/19 at 0930. Nothing further was needed.

## 2019-05-09 ENCOUNTER — Encounter: Payer: Self-pay | Admitting: Internal Medicine

## 2019-05-12 ENCOUNTER — Other Ambulatory Visit: Payer: Self-pay | Admitting: Family

## 2019-05-18 ENCOUNTER — Ambulatory Visit: Payer: Medicare Other | Attending: Internal Medicine

## 2019-05-18 DIAGNOSIS — Z23 Encounter for immunization: Secondary | ICD-10-CM | POA: Insufficient documentation

## 2019-05-18 NOTE — Progress Notes (Signed)
   Covid-19 Vaccination Clinic  Name:  Tyler Hendricks    MRN: [id number] DOB: 1951-01-17  05/18/2019  [name] was observed post Covid-19 immunization for 15 minutes without incidence. He was provided with Vaccine Information Sheet and instruction to access the V-Safe system.   [name] was instructed to call 911 with any severe reactions post vaccine: Marland Kitchen Difficulty breathing  . Swelling of your face and throat  . A fast heartbeat  . A bad rash all over your body  . Dizziness and weakness    Immunizations Administered    Name Date Dose VIS Date Route   Pfizer COVID-19 Vaccine 05/18/2019  8:26 AM 0.3 mL 03/03/2019 Intramuscular   Manufacturer: [place]   Lot: [id number]   [place]: [id number]

## 2019-05-22 ENCOUNTER — Telehealth: Payer: Self-pay | Admitting: Pulmonary Disease

## 2019-05-22 ENCOUNTER — Telehealth: Payer: Self-pay

## 2019-05-22 MED FILL — BIKTARVY 50-200-25 MG TABS: 50-200-25 | 30 days supply | Qty: 30 | Fill #0

## 2019-05-22 NOTE — Telephone Encounter (Signed)
Called and spoke with pt. Pt states he is still having problems with his cough which he states is about the same since last visit and states the only thing that has really helped with the cough is the hycodan.  Due to this, pt is requesting a refill of the hycodan. Dr. Loanne Drilling, please advise if you are okay refilling med for pt.

## 2019-05-22 NOTE — Telephone Encounter (Signed)
COVID-19 Pre-Screening Questions:05/22/19   Do you currently have a fever (>100 F), chills or unexplained body aches? NO   Are you currently experiencing new cough, shortness of breath, sore throat, runny nose?NO  .  Have you recently travelled outside the state of [place] in the last 14 days? NO  .  Have you been in contact with someone that is currently pending confirmation of Covid19 testing or has been confirmed to have the Covid19 virus?  NO  **If the patient answers NO to ALL questions -  advise the patient to please call the clinic before coming to the office should any symptoms develop.     

## 2019-05-23 ENCOUNTER — Telehealth: Payer: Self-pay | Admitting: Pulmonary Disease

## 2019-05-23 ENCOUNTER — Other Ambulatory Visit: Payer: Medicare Other

## 2019-05-23 ENCOUNTER — Other Ambulatory Visit: Payer: Self-pay

## 2019-05-23 DIAGNOSIS — Z113 Encounter for screening for infections with a predominantly sexual mode of transmission: Secondary | ICD-10-CM

## 2019-05-23 DIAGNOSIS — B20 Human immunodeficiency virus [HIV] disease: Secondary | ICD-10-CM

## 2019-05-23 MED ORDER — HYDROCODONE-HOMATROPINE 5-1.5 MG/5ML PO SYRP: 5.0000 mL | ORAL_SOLUTION | Freq: Four times a day (QID) | ORAL | 0 refills | Status: DC | PRN

## 2019-05-23 NOTE — Telephone Encounter (Signed)
Med ordered

## 2019-05-23 NOTE — Telephone Encounter (Signed)
Spoke with pt, advised him that we already spoke to him today about sending his Rx in. Pt's phone hung up. I called back but went to VM. Nothing further is needed.

## 2019-05-23 NOTE — Telephone Encounter (Signed)
Pt calling back wondering status

## 2019-05-23 NOTE — Telephone Encounter (Signed)
Patient has been made aware that medication refill has been sent.

## 2019-05-24 LAB — T-HELPER CELL (CD4) - (RCID CLINIC ONLY)
CD4 % Helper T Cell: 54 % (ref 33–65)
CD4 T Cell Abs: 726 /uL (ref 400–1790)

## 2019-05-26 LAB — COMPREHENSIVE METABOLIC PANEL
AG Ratio: 1.4 (calc) (ref 1.0–2.5)
ALT: 24 U/L (ref 9–46)
AST: 28 U/L (ref 10–35)
Albumin: 3.9 g/dL (ref 3.6–5.1)
Alkaline phosphatase (APISO): 105 U/L (ref 35–144)
BUN: 16 mg/dL (ref 7–25)
CO2: 28 mmol/L (ref 20–32)
Calcium: 9.7 mg/dL (ref 8.6–10.3)
Chloride: 104 mmol/L (ref 98–110)
Creat: 0.76 mg/dL (ref 0.70–1.25)
Globulin: 2.7 g/dL (calc) (ref 1.9–3.7)
Glucose, Bld: 171 mg/dL — ABNORMAL HIGH (ref 65–99)
Potassium: 4.6 mmol/L (ref 3.5–5.3)
Sodium: 140 mmol/L (ref 135–146)
Total Bilirubin: 2.5 mg/dL — ABNORMAL HIGH (ref 0.2–1.2)
Total Protein: 6.6 g/dL (ref 6.1–8.1)

## 2019-05-26 LAB — HIV-1 RNA QUANT-NO REFLEX-BLD
HIV 1 RNA Quant: <20 copies/mL
HIV-1 RNA Quant, Log: <1.3 Log copies/mL

## 2019-05-26 LAB — RPR: RPR Ser Ql: NONREACTIVE

## 2019-05-27 ENCOUNTER — Other Ambulatory Visit (HOSPITAL_COMMUNITY)
Admission: RE | Admit: 2019-05-27 | Discharge: 2019-05-27 | Disposition: A | Discharge status: Home / Self Care | Payer: Medicare Other | Source: Ambulatory Visit | Attending: Pulmonary Disease | Admitting: Pulmonary Disease

## 2019-05-27 DIAGNOSIS — U071 COVID-19: Secondary | ICD-10-CM | POA: Diagnosis not present

## 2019-05-27 DIAGNOSIS — Z01812 Encounter for preprocedural laboratory examination: Secondary | ICD-10-CM | POA: Diagnosis not present

## 2019-05-27 LAB — SARS CORONAVIRUS 2 (TAT 6-24 HRS): SARS Coronavirus 2: POSITIVE — AB

## 2019-05-28 ENCOUNTER — Telehealth: Payer: Self-pay | Admitting: Physician Assistant

## 2019-05-28 ENCOUNTER — Encounter: Payer: Self-pay | Admitting: Physician Assistant

## 2019-05-28 NOTE — Telephone Encounter (Signed)
Called to discuss with Georges Lynch about Covid symptoms and the use of bamlanivimab or casirivimab/imdevimab, a monoclonal antibody infusion for those with mild to moderate Covid symptoms and at a high risk of hospitalization.    Pt does not qualify for infusion therapy as pt's symptoms first presented > 10 days prior to timing of infusion. Symptoms tier reviewed as well as criteria for ending isolation. Preventative practices reviewed. Patient verbalized understanding. He has covid back in December and suspect he is just testing persistently positive. He had the test as a screening for PFTs coming up to evaluate a chronic cough.      Patient Active Problem List   Diagnosis Date Noted  . Chronic bilateral low back pain without sciatica 03/02/2019  . Muscle cramps 03/02/2019  . Cirrhosis of liver without ascites ([place]) 01/08/2019  . Secondary esophageal varices without bleeding ([place]) 01/08/2019  . Intestinal metaplasia of gastric mucosa 01/08/2019  . Hepatitis B core antibody positive 01/08/2019  . Chronic cough 06/24/2018  . Healthcare maintenance 05/11/2018  . Hx of colonic polyp 04/27/2018  . Change in bowel habit 04/27/2018  . Ventral hernia without obstruction or gangrene 04/27/2018  . Thrombocytopenia ([place]) 04/27/2018  . Hypersplenism 04/27/2018  . Positive hepatitis C antibody test 04/27/2018  . Coronary artery disease without angina pectoris 02/25/2018  . Obstructive sleep apnea 10/25/2017  . Tinnitus 10/25/2017  . Chronic pain of both knees 08/25/2017  . Extrinsic asthma without complication 123456  . Vitamin D deficiency 08/25/2017  . HIV disease ([place]) 04/15/2017  . Primary osteoarthritis of both knees 04/15/2017  . Type 2 diabetes mellitus with hyperglycemia, without long-term current use of insulin ([place]) 04/15/2017  . Essential hypertension 04/15/2017    Angelena Form PA-C

## 2019-05-29 ENCOUNTER — Telehealth: Payer: Self-pay | Admitting: Pulmonary Disease

## 2019-05-29 ENCOUNTER — Telehealth: Payer: Self-pay | Admitting: Emergency Medicine

## 2019-05-29 NOTE — Progress Notes (Signed)
Called Tania from Dr. Cordelia Pen office with + covid results for this pt. These are the current guidelines:  Positive Results for:  Asymptomatic: Procedure postponed and quarantine for 10 days. Symptomatic: Procedure postponed and quarantine for 14 days. Immunocompromised: Procedure postponed and quarantine for 20 days.  The pt will not be retested for 90 days from the + result.

## 2019-05-29 NOTE — Telephone Encounter (Signed)
Tyler Hendricks with [facility] drive thru testing called to let Dr. Lamonte Sakai know that COVID test came back positive on Saturday 05/27/19 - he is scheduled for a PFT on Wednesday 05/31/19 - pt does not know that he is COVID positive - protocol states that the Dr/RN/CMA must inform patient due to any questions that he may have - Please advise pt at [phone] or [phone] - //tsb

## 2019-05-29 NOTE — Telephone Encounter (Signed)
Pt called-- wants to double check that he needs to reschedule PFT because he had covid in December and is within the 90 day window of still testing positive from original infection.

## 2019-05-29 NOTE — Telephone Encounter (Signed)
Left message for patient to call back  

## 2019-05-30 NOTE — Telephone Encounter (Signed)
lmtcb for pt.  

## 2019-05-30 NOTE — Telephone Encounter (Signed)
Patient tested positive for C-19 on 12/15. Patient had covid testing for PFT and still positive. Pt reports no symptoms Pt has had 1st dose of covid vaccine on 2/25.  Per Dr. Loanne Drilling if patient is not having any sx ok to proceed with PFT.Marland Kitchen

## 2019-05-30 NOTE — Telephone Encounter (Signed)
Nothing noted in pts chart. Will sign off as an error.

## 2019-05-31 NOTE — Telephone Encounter (Signed)
Called and spoke to pt's wife. Informed her of the results and the need to reschedule the PFT. PFT and COVID test rescheduled for late May (first available). Pt will call back to make an appt with Dr. Loanne Drilling for follow up.   Will forward to Dr. Loanne Drilling as Juluis Rainier.

## 2019-06-01 ENCOUNTER — Telehealth: Payer: Self-pay | Admitting: Internal Medicine

## 2019-06-01 MED ORDER — ROSUVASTATIN CALCIUM 10 MG PO TABS: 10.0000 mg | ORAL_TABLET | Freq: Every day | ORAL | 1 refills | Status: DC

## 2019-06-01 NOTE — Telephone Encounter (Signed)
New message:   1.Medication Requested: rosuvastatin (CRESTOR) 10 MG tablet 2. Pharmacy (Name, [facility]): [facility], [place] [facility] [address] 3. On Med List: yes  4. Last Visit with PCP: 03/02/19  5. Next visit date with PCP: None   Agent: Please be advised that RX refills may take up to 3 business days. We ask that you follow-up with your pharmacy.

## 2019-06-12 ENCOUNTER — Encounter: Payer: Medicare Other | Admitting: Family

## 2019-06-14 ENCOUNTER — Telehealth: Payer: Self-pay | Admitting: Pulmonary Disease

## 2019-06-14 ENCOUNTER — Ambulatory Visit: Payer: Medicare Other | Attending: Internal Medicine

## 2019-06-14 DIAGNOSIS — Z23 Encounter for immunization: Secondary | ICD-10-CM

## 2019-06-14 MED ORDER — HYDROCODONE-HOMATROPINE 5-1.5 MG/5ML PO SYRP: 5.0000 mL | ORAL_SOLUTION | Freq: Four times a day (QID) | ORAL | 0 refills | Status: DC | PRN

## 2019-06-14 NOTE — Telephone Encounter (Signed)
Patient requesting another refill of Hycodan cough syrup. [facility] Last fill 05/23/19 PFT has been rescheduled for May due to positive Covid.   Last OV 04/12/19  Assessment & Plan:   Discussion: 70 year old male former smoker with history COVID in 02/2019 who presents for chronic cough. CT chest reviewed with patient and wife on phone. Subpleural ground glass opacities and reticular changes without honeycombing. This may represent early ILD vs post-viral changes related to COVID-19. Will obtain PFTs to rule out restrictive defect and/or reduced DLCO. Will manage cough with syrup and over-the-counters for now.  Chronic cough --We will arrange for pulmonary function test --Hycodan syrup ordered for cough --Pending PFTs, will order high-resolution CT Chest  Severe OSA on CPAP --Continue non-invasive ventilation nightly --Avoid driving when sleepy  Health Maintenance     Immunization History  Administered Date(s) Administered  . Influenza, High Dose Seasonal PF 12/20/2017, 11/17/2018  . Tdap 09/07/2018  . Zoster Recombinat (Shingrix) 01/06/2017   CT Lung Screen - not qualified  No orders of the defined types were placed in this encounter.      Meds ordered this encounter  Medications  . HYDROcodone-homatropine (HYCODAN) 5-1.5 MG/5ML syrup    Sig: Take 5 mLs by mouth every 6 (six) hours as needed for cough.    Dispense:  473 mL    Refill:  0   Return Follow-up after PFTs with me.

## 2019-06-14 NOTE — Telephone Encounter (Signed)
Sent. Recommend he start taper use 8-12 hours

## 2019-06-14 NOTE — Progress Notes (Signed)
   Covid-19 Vaccination Clinic  Name:  Tyler Hendricks    MRN: [id number] DOB: October 22, 1950  06/14/2019  [name] was observed post Covid-19 immunization for 15 minutes without incident. He was provided with Vaccine Information Sheet and instruction to access the V-Safe system.   [name] was instructed to call 911 with any severe reactions post vaccine: Marland Kitchen Difficulty breathing  . Swelling of face and throat  . A fast heartbeat  . A bad rash all over body  . Dizziness and weakness   Immunizations Administered    Name Date Dose VIS Date Route   Pfizer COVID-19 Vaccine 06/14/2019  8:10 AM 0.3 mL 03/03/2019 Intramuscular   Manufacturer: [place]   Lot: [id number]   [place]: [id number]

## 2019-06-14 NOTE — Telephone Encounter (Signed)
Patient notified.  Nothing further needed. 

## 2019-06-16 MED FILL — BIKTARVY 50-200-25 MG TABS: 50-200-25 | 30 days supply | Qty: 30 | Fill #1

## 2019-06-19 ENCOUNTER — Ambulatory Visit (INDEPENDENT_AMBULATORY_CARE_PROVIDER_SITE_OTHER): Payer: Medicare Other | Admitting: Family

## 2019-06-19 ENCOUNTER — Other Ambulatory Visit: Payer: Self-pay

## 2019-06-19 ENCOUNTER — Encounter: Payer: Self-pay | Admitting: Family

## 2019-06-19 VITALS — BP 131/79 | HR 60 | Temp 97.9°F | Ht 72.0 in | Wt 226.0 lb

## 2019-06-19 DIAGNOSIS — B20 Human immunodeficiency virus [HIV] disease: Secondary | ICD-10-CM

## 2019-06-19 DIAGNOSIS — Z0000 Encounter for general adult medical examination without abnormal findings: Secondary | ICD-10-CM | POA: Diagnosis not present

## 2019-06-19 MED ORDER — BIKTARVY 50-200-25 MG PO TABS: 1.0000 | ORAL_TABLET | Freq: Every day | ORAL | 5 refills | Status: DC

## 2019-06-19 NOTE — Assessment & Plan Note (Signed)
   Covid vaccinations up-to-date per recommendations.

## 2019-06-19 NOTE — Patient Instructions (Signed)
Nice to see you.  Continue take your [place] daily as prescribed.  Refills are at the pharmacy.  Plan for follow-up in 6 months or sooner if needed with lab work 1 to 2 weeks prior to your appointment  Have a great day and stay safe!

## 2019-06-19 NOTE — Assessment & Plan Note (Signed)
[name] has well-controlled HIV disease with good adherence and tolerance to his ART regimen of Biktarvy.  No signs/symptoms of opportunistic infection or progressive HIV disease.  We reviewed his lab work and discussed the plan of care.  Introduced long-acting injectable medications which he wishes to stay with Biktarvy at this time.  Continue current dose of Biktarvy.  Plan for follow-up in 6 months or sooner if needed with lab work 1 to 2 weeks prior to appointment.

## 2019-06-19 NOTE — Progress Notes (Signed)
Subjective:    Patient ID: Tyler Hendricks, male    DOB: [birth year], 69 y.o.   MRN: [id number]  Chief Complaint  Patient presents with  . HIV Positive/AIDS     HPI:  Tyler Hendricks is a 69 y.o. male with HIV disease who was last seen in the office on 11/23/2018 with good adherence and tolerance to his ART regimen of [place].  He had been started on omeprazole with resulting change in ART therapy to [facility].  Blood work from 11/09/2018 shows a viral load that was undetectable and CD4 count of 793.  Most recent blood work completed on 05/23/2019 with viral load that remains undetectable and CD4 count of 726.  Mr. Stahlecker continues to take his Biktarvy as prescribed with no adverse side effects or missed doses since his last office visit.  Overall feeling well with change of medication.  No new concerns or complaints today. Denies fevers, chills, night sweats, headaches, changes in vision, neck pain/stiffness, nausea, diarrhea, vomiting, lesions or rashes.  Mr. Colato has no problems obtaining his medication from the pharmacy.  Denies any feelings of being down, depressed, or hopeless recently.  He continues to work full-time.  No recreational or illicit drug use, tobacco use, or alcohol consumption.  He has completed his Pfizer vaccinations for [employer].   Allergies  Allergen Reactions  . Lopressor [Metoprolol Tartrate] Rash and Other (See Comments)    Bleeding (non-specific)   . Integrilin [Eptifibatide] Other (See Comments)    Bleeding (non-specific)      Outpatient Medications Prior to Visit  Medication Sig Dispense Refill  . amLODipine (NORVASC) 2.5 MG tablet Take 2.5 mg by mouth daily.    Marland Kitchen aspirin 81 MG tablet Take 81 mg by mouth daily.    Marland Kitchen atenolol (TENORMIN) 50 MG tablet Take 50 mg by mouth daily.    . clopidogrel (PLAVIX) 75 MG tablet TAKE 1 TABLET(75 MG) BY MOUTH DAILY 90 tablet 3  . HYDROcodone-homatropine (HYCODAN) 5-1.5 MG/5ML syrup Take 5 mLs by mouth every 6 (six) hours as needed  for cough. 473 mL 0  . losartan-hydrochlorothiazide (HYZAAR) 100-25 MG tablet Take 1 tablet by mouth daily.   3  . methocarbamol (ROBAXIN) 500 MG tablet Take 1 tablet (500 mg total) by mouth 2 (two) times daily as needed for muscle spasms. 60 tablet 2  . Multiple Vitamin (MULTIVITAMIN) tablet Take 1 tablet by mouth daily.    . rosuvastatin (CRESTOR) 10 MG tablet Take 1 tablet (10 mg total) by mouth daily. 90 tablet 1  . sitaGLIPtin (JANUVIA) 100 MG tablet Take 100 mg by mouth daily.    Marland Kitchen SYNJARDY XR 12.07-998 MG TB24 TK 2 TS PO QD  3  . BIKTARVY 50-200-25 MG TABS tablet TAKE 1 TABLET BY MOUTH DAILY. 30 tablet 5   No facility-administered medications prior to visit.     Past Medical History:  Diagnosis Date  . Abnormal result of cardiovascular function study, unspecified   . Atherosclerotic heart disease of native coronary artery without angina pectoris   . CAD (coronary artery disease)   . Coronary angioplasty status   . Diabetes mellitus without complication ([place])   . Essential (primary) hypertension   . Gallstones   . Hepatic cirrhosis ([place])   . Hepatitis C   . HIV infection ([place])   . Hyperlipidemia   . Hypertension   . Internal hemorrhoids   . Kidney stones   . Mixed hyperlipidemia   . Palpitations   . Sleep apnea  CPAP  . Tubular adenoma of colon      Past Surgical History:  Procedure Laterality Date  . CARDIAC CATHETERIZATION Left 04/2013  . chlecystectomy    . COLONOSCOPY  05/02/2013  . PERCUTANEOUS CORONARY STENT INTERVENTION (PCI-S)    . POLYPECTOMY    . UMBILICAL HERNIA REPAIR       Review of Systems  Constitutional: Negative for appetite change, chills, fatigue, fever and unexpected weight change.  Eyes: Negative for visual disturbance.  Respiratory: Negative for cough, chest tightness, shortness of breath and wheezing.   Cardiovascular: Negative for chest pain and leg swelling.  Gastrointestinal: Negative for abdominal pain, constipation, diarrhea,  nausea and vomiting.  Genitourinary: Negative for dysuria, flank pain, frequency, genital sores, hematuria and urgency.  Skin: Negative for rash.  Allergic/Immunologic: Negative for immunocompromised state.  Neurological: Negative for dizziness and headaches.      Objective:    BP 131/79   Pulse 60   Temp 97.9 F (36.6 C)   Ht 6' (1.829 m)   Wt 226 lb (102.5 kg)   SpO2 95%   BMI 30.65 kg/m  Nursing note and vital signs reviewed.  Physical Exam Constitutional:      General: He is not in acute distress.    Appearance: He is well-developed.  Eyes:     Conjunctiva/sclera: Conjunctivae normal.  Cardiovascular:     Rate and Rhythm: Normal rate and regular rhythm.     Heart sounds: Normal heart sounds. No murmur. No friction rub. No gallop.   Pulmonary:     Effort: Pulmonary effort is normal. No respiratory distress.     Breath sounds: Normal breath sounds. No wheezing or rales.  Chest:     Chest wall: No tenderness.  Abdominal:     General: Bowel sounds are normal.     Palpations: Abdomen is soft.     Tenderness: There is no abdominal tenderness.  Musculoskeletal:     Cervical back: Neck supple.  Lymphadenopathy:     Cervical: No cervical adenopathy.  Skin:    General: Skin is warm and dry.     Findings: No rash.  Neurological:     Mental Status: He is alert and oriented to person, place, and time.  Psychiatric:        Behavior: Behavior normal.        Thought Content: Thought content normal.        Judgment: Judgment normal.      Depression screen [facility] 2/9 11/23/2018 10/04/2018 04/15/2017  Decreased Interest 0 0 0  Down, Depressed, Hopeless 0 0 0  PHQ - 2 Score 0 0 0       Assessment & Plan:    Patient Active Problem List   Diagnosis Date Noted  . Chronic bilateral low back pain without sciatica 03/02/2019  . Muscle cramps 03/02/2019  . Cirrhosis of liver without ascites ([place]) 01/08/2019  . Secondary esophageal varices without bleeding ([place]) 01/08/2019  .  Intestinal metaplasia of gastric mucosa 01/08/2019  . Hepatitis B core antibody positive 01/08/2019  . Chronic cough 06/24/2018  . Healthcare maintenance 05/11/2018  . Hx of colonic polyp 04/27/2018  . Change in bowel habit 04/27/2018  . Ventral hernia without obstruction or gangrene 04/27/2018  . Thrombocytopenia ([place]) 04/27/2018  . Hypersplenism 04/27/2018  . Positive hepatitis C antibody test 04/27/2018  . Coronary artery disease without angina pectoris 02/25/2018  . Obstructive sleep apnea 10/25/2017  . Tinnitus 10/25/2017  . Chronic pain of both knees 08/25/2017  .  Extrinsic asthma without complication 123456  . Vitamin D deficiency 08/25/2017  . HIV disease ([place]) 04/15/2017  . Primary osteoarthritis of both knees 04/15/2017  . Type 2 diabetes mellitus with hyperglycemia, without long-term current use of insulin ([place]) 04/15/2017  . Essential hypertension 04/15/2017     Problem List Items Addressed This Visit      Other   HIV disease [facility])    [name] has well-controlled HIV disease with good adherence and tolerance to his ART regimen of Biktarvy.  No signs/symptoms of opportunistic infection or progressive HIV disease.  We reviewed his lab work and discussed the plan of care.  Introduced long-acting injectable medications which he wishes to stay with Biktarvy at this time.  Continue current dose of Biktarvy.  Plan for follow-up in 6 months or sooner if needed with lab work 1 to 2 weeks prior to appointment.      Relevant Medications   bictegravir-emtricitabine-tenofovir AF (BIKTARVY) 50-200-25 MG TABS tablet   Other Relevant Orders   T-helper cell (CD4)- ([facility] clinic only)   HIV-1 RNA quant-no reflex-bld   Comprehensive metabolic panel   Healthcare maintenance - Primary     Covid vaccinations up-to-date per recommendations.          I have changed [employer]. I am also having him maintain his losartan-hydrochlorothiazide, Synjardy XR, aspirin,  multivitamin, sitaGLIPtin, methocarbamol, clopidogrel, amLODipine, atenolol, rosuvastatin, and HYDROcodone-homatropine.   Meds ordered this encounter  Medications  . bictegravir-emtricitabine-tenofovir AF (BIKTARVY) 50-200-25 MG TABS tablet    Sig: Take 1 tablet by mouth daily.    Dispense:  30 tablet    Refill:  5    Order Specific Question:   Supervising Provider    Answer:   Carlyle Basques [[id number]]     Follow-up: Return in about 6 months (around 12/20/2019).   [name], MSN, FNP-C Nurse Practitioner [facility] [place] number: [phone]

## 2019-06-30 ENCOUNTER — Telehealth: Payer: Self-pay | Admitting: Pulmonary Disease

## 2019-06-30 ENCOUNTER — Telehealth: Payer: Self-pay | Admitting: Primary Care

## 2019-06-30 MED ORDER — BENZONATATE 100 MG PO CAPS: 200.0000 mg | ORAL_CAPSULE | Freq: Three times a day (TID) | ORAL | 1 refills | Status: DC

## 2019-06-30 NOTE — Telephone Encounter (Signed)
I cant continue to refill prescription cough medications as I have never seen patient. Rx tessalon perles Q 8hr prn cough.

## 2019-06-30 NOTE — Telephone Encounter (Signed)
Spoke with pt, he states he forgot his cough medication in [place] when he went away for a couple of days. He coughs so bad that he has stomach pain. He is requesting a new Rx for Hycodan for his cough. Can we send in a new Rx for pt?  [facility] [address]

## 2019-06-30 NOTE — Telephone Encounter (Signed)
rx by Volanda Napoleon NP

## 2019-06-30 NOTE — Telephone Encounter (Signed)
Please see phone note addresssed by NP Derl Barrow NP.

## 2019-06-30 NOTE — Telephone Encounter (Signed)
Pt calling back b/c now his stomach is hurting. Can be reached at [phone]

## 2019-06-30 NOTE — Telephone Encounter (Signed)
ATC patient and did not get an answer. I left a message that the office is closed for the weekend and that we sent a script and that if cough worsens over the weekend to proceed to urgent care or ER.

## 2019-07-04 ENCOUNTER — Other Ambulatory Visit: Payer: Self-pay

## 2019-07-04 ENCOUNTER — Ambulatory Visit (INDEPENDENT_AMBULATORY_CARE_PROVIDER_SITE_OTHER): Payer: Medicare Other | Admitting: Pulmonary Disease

## 2019-07-04 ENCOUNTER — Encounter: Payer: Self-pay | Admitting: Pulmonary Disease

## 2019-07-04 DIAGNOSIS — J45909 Unspecified asthma, uncomplicated: Secondary | ICD-10-CM

## 2019-07-04 DIAGNOSIS — B20 Human immunodeficiency virus [HIV] disease: Secondary | ICD-10-CM | POA: Diagnosis not present

## 2019-07-04 DIAGNOSIS — J849 Interstitial pulmonary disease, unspecified: Secondary | ICD-10-CM | POA: Diagnosis not present

## 2019-07-04 DIAGNOSIS — G4733 Obstructive sleep apnea (adult) (pediatric): Secondary | ICD-10-CM

## 2019-07-04 DIAGNOSIS — R05 Cough: Secondary | ICD-10-CM

## 2019-07-04 DIAGNOSIS — R053 Chronic cough: Secondary | ICD-10-CM

## 2019-07-04 NOTE — Progress Notes (Addendum)
Virtual Visit via Telephone Note  I connected with Tyler Hendricks on 07/04/19 at  3:30 PM EDT by telephone and verified that I am speaking with the correct person using two identifiers.  Location: Patient: Home Provider: [facility] - [id number], [address], [place], [place] [address]   I discussed the limitations, risks, security and privacy concerns of performing an evaluation and management service by telephone and the availability of in person appointments. I also discussed with the patient that there may be a patient responsible charge related to this service. The patient expressed understanding and agreed to proceed.  Patient consented to consult via telephone: Yes People present and their role in pt care: Pt     History of Present Illness:  69 year old male former smoker initially referred to our office in January/2021 for chronic cough  Past medical history status post Covid December/2020, HIV, severe obstructive sleep apnea managed on NIV, hypertension, type 2 diabetes, cirrhosis of the liver Smoking history: Former smoker.  Quit 1994.  30-pack-year smoking history Maintenance: none Patient of Dr. Loanne Drilling  Chief complaint:    69 year old male former smoker initially last seen in January/2021 by Dr. Loanne Drilling.  For consult of cough.  Patient had contracted Covid in December/2020.  He is also a former smoker that quit 1994 with a 30-pack-year smoking history.  At the time of the January/2021 office visit it was recommended the patient complete a pulmonary function test.  High-resolution CT chest was ordered.  Patient had severe obstructive sleep apnea on CPAP and was encouraged to continue NIV.  Per chart review high-resolution CT chest was never completed.  Pulmonary function test was not completed.  Patient completing virtual visit with our office today for chronic cough.  Patient reports that he has had this cough for many months prior to Covid.  He reports that  he has had ongoing work-up with allergy asthma.  Primary care in our office.  He is waiting to complete pulmonary function testing which he is scheduled in May/2021 to complete.  Patient reports the only thing that helps him with his cough is his narcotic cough syrup.  See information listed below:  I reviewed [place] PMP aware patient's overdose risk score is 410.  He has had over 8 different narcotic prescribers over the last 2 years.  Using a total of 3 pharmacies.  Last refill for the medication was on 06/14/2019.  It was Hydromet 473 mL.  Which is a 23-day worth prescription.  This was refilled prematurely as last refill was on 05/23/2019 which is also a 23-day prescription and 473 mL.  Patient reports that he lost his last Hydromet bottle.  He left today [place] when he was traveling.  He has tried [employer] he did not feel these helped.  Patient is quite concerned about getting a refill of his cough meds.  He reports that he absolutely needs to have these.  We will discuss this today.  Patient is also followed by infectious disease for his HIV.  He has recent follow-up with him.  Observations/Objective:  03/04/2019-CT chest without contrast-coronary artery calcifications are noted suggesting coronary artery disease, hepatic cirrhosis is noted with multiple enlarged collateral veins consistent with portal hypertension, mild bibasilar interstitial densities are noted, right greater than left most consistent with scarring, aortic arthrosclerosis  02/26/20 - covid positive   Social History   Tobacco Use  Smoking Status Former Smoker  . Types: Cigarettes  . Quit date: 42  .  Years since quitting: 27.2  Smokeless Tobacco Never Used   Immunization History  Administered Date(s) Administered  . Fluad Quad(high Dose 65+) 11/17/2018  . Influenza, High Dose Seasonal PF 12/20/2017, 11/17/2018  . PFIZER SARS-COV-2 Vaccination 05/18/2019, 06/14/2019  . Tdap 09/07/2018  . Zoster Recombinat  (Shingrix) 01/06/2017     Assessment and Plan:  Obstructive sleep apnea Plan: Continue NIV at night  Extrinsic asthma without complication Patient carries his clinical diagnosis Patient continues have chronic cough  Plan: Complete pulmonary function testing as scheduled to further evaluate  HIV disease ([place]) Plan: Continue to follow-up with infectious disease  Chronic cough Patient with ongoing months of chronic cough Status post Covid diagnosis in December/2020 but he reports that he was asymptomatic Patient carries a clinical diagnosis of asthma, he denies this diagnosis Patient reports the only that helps with his cough is narcotic cough meds Since 04/12/2019 patient has received 4 bottles of 473 mL of Hydromet. Patient has pending PFTs January/2021 office note from Dr. Loanne Drilling makes mention of a high-resolution CT chest, I do not see this completed  Plan: Explained to patient that we will not be refilling narcotic cough meds at this time Reported I will follow-up with Dr. Loanne Drilling if she feels that she is comfortable refilling this then she is more than welcome to We will move up patient's office visit with our team to further evaluate his symptoms We will try to move up his pulmonary function test I will discussed the case with Dr. Loanne Drilling to see if she would like to proceed forward with a high-resolution CT chest to further evaluate the basilar scarring seen in the December/2020 CT completed by PCP Patient can utilize Delsym over-the-counter cough meds or Tessalon Perles If symptoms persist with negative pulm work up could consider referral to cough clinic of Dr. Melvyn Novas  07/04/2019-addendum: PCC's were able to move up patient's pulmonary function test to 07/17/2019  Discussed case with Dr. Loanne Drilling.  We will proceed forward with a high-resolution CT chest.  We will order that today.  Wyn Quaker, FNP  Follow Up Instructions:  Return in about 2 weeks (around 07/18/2019),  or if symptoms worsen or fail to improve, for Follow up with Dr. Loanne Drilling, Follow up for PFT.   I discussed the assessment and treatment plan with the patient. The patient was provided an opportunity to ask questions and all were answered. The patient agreed with the plan and demonstrated an understanding of the instructions.   The patient was advised to call back or seek an in-person evaluation if the symptoms worsen or if the condition fails to improve as anticipated.  I provided 25 minutes of non-face-to-face time during this encounter.   Lauraine Rinne, NP

## 2019-07-04 NOTE — Assessment & Plan Note (Signed)
Plan: Continue to follow-up with infectious disease

## 2019-07-04 NOTE — Telephone Encounter (Signed)
Spoke with pt. States that he got the prescription for Tessalon but it's not working for his cough. Pt has been scheduled for a televisit with Aaron Edelman this afternoon at 1530. Nothing further was needed.

## 2019-07-04 NOTE — Patient Instructions (Addendum)
You were seen today by Lauraine Rinne, NP  for:   1. Obstructive sleep apnea  Continue to wear your noninvasive ventilation at night  2. Extrinsic asthma without complication, unspecified asthma severity, unspecified whether persistent  This could be a contributing factor to her cough  Complete pulmonary function testing as scheduled  3. HIV disease ([place])  Keep follow-up with infectious disease  4. Chronic cough  I am concerned regarding the amount of narcotic cough medicine that you seem to be using.  I understand that you have misplaced some traveling to [place].  In the meantime please utilize over-the-counter cough meds such as Delsym or Mucinex DM.  Can also use Ladona Ridgel  I will discuss your case with Dr. Loanne Drilling.  We may consider getting a high-resolution CT of your chest.    I will also see if we can move up your pulmonary function test  Follow Up:    Return in about 2 weeks (around 07/18/2019), or if symptoms worsen or fail to improve, for Follow up with Dr. Loanne Drilling, Follow up for PFT.   Please do your part to reduce the spread of COVID-19:      Reduce your risk of any infection  and COVID19 by using the similar precautions used for avoiding the common cold or flu:  Marland Kitchen Wash your hands often with soap and warm water for at least 20 seconds.  If soap and water are not readily available, use an alcohol-based hand sanitizer with at least 60% alcohol.  . If coughing or sneezing, cover your mouth and nose by coughing or sneezing into the elbow areas of your shirt or coat, into a tissue or into your sleeve (not your hands). Langley Gauss A MASK when in public  . Avoid shaking hands with others and consider head nods or verbal greetings only. . Avoid touching your eyes, nose, or mouth with unwashed hands.  . Avoid close contact with people who are sick. . Avoid places or events with large numbers of people in one location, like concerts or sporting events. . If you have  some symptoms but not all symptoms, continue to monitor at home and seek medical attention if your symptoms worsen. . If you are having a medical emergency, call 911.   [place] / e-Visit: [email or web address]         [facility]: [facility]: [id number]                   [facility]: [id number]     It is flu season:   >>> Best ways to protect herself from the flu: Receive the yearly flu vaccine, practice good hand hygiene washing with soap and also using hand sanitizer when available, eat a nutritious meals, get adequate rest, hydrate appropriately   Please contact the office if your symptoms worsen or you have concerns that you are not improving.   Thank you for choosing [facility] for your healthcare, and for allowing [place] to partner with you on your healthcare journey. I am thankful to be able to provide care to you today.   Wyn Quaker FNP-C

## 2019-07-04 NOTE — Assessment & Plan Note (Addendum)
Patient with ongoing months of chronic cough Status post Covid diagnosis in December/2020 but he reports that he was asymptomatic Patient carries a clinical diagnosis of asthma, he denies this diagnosis Patient reports the only that helps with his cough is narcotic cough meds Since 04/12/2019 patient has received 4 bottles of 473 mL of Hydromet. Patient has pending PFTs January/2021 office note from Dr. Loanne Drilling makes mention of a high-resolution CT chest, I do not see this completed  Plan: Explained to patient that we will not be refilling narcotic cough meds at this time Reported I will follow-up with Dr. Loanne Drilling if she feels that she is comfortable refilling this then she is more than welcome to We will move up patient's office visit with our team to further evaluate his symptoms We will try to move up his pulmonary function test I will discussed the case with Dr. Loanne Drilling to see if she would like to proceed forward with a high-resolution CT chest to further evaluate the basilar scarring seen in the December/2020 CT completed by PCP Patient can utilize Delsym over-the-counter cough meds or Tessalon Perles If symptoms persist with negative pulm work up could consider referral to cough clinic of Dr. Melvyn Novas

## 2019-07-04 NOTE — Assessment & Plan Note (Signed)
Patient carries his clinical diagnosis Patient continues have chronic cough  Plan: Complete pulmonary function testing as scheduled to further evaluate

## 2019-07-04 NOTE — Addendum Note (Signed)
Addended by: Lauraine Rinne on: 07/04/2019 04:17 PM   Modules accepted: Orders

## 2019-07-04 NOTE — Assessment & Plan Note (Signed)
Plan: Continue NIV at night

## 2019-07-13 ENCOUNTER — Other Ambulatory Visit: Payer: Medicare Other

## 2019-07-13 ENCOUNTER — Ambulatory Visit
Admission: RE | Admit: 2019-07-13 | Discharge: 2019-07-13 | Disposition: A | Discharge status: Home / Self Care | Payer: Medicare Other | Source: Ambulatory Visit | Attending: Pulmonary Disease | Admitting: Pulmonary Disease

## 2019-07-13 DIAGNOSIS — R053 Chronic cough: Secondary | ICD-10-CM

## 2019-07-13 DIAGNOSIS — J984 Other disorders of lung: Secondary | ICD-10-CM | POA: Diagnosis not present

## 2019-07-13 DIAGNOSIS — R05 Cough: Secondary | ICD-10-CM

## 2019-07-13 DIAGNOSIS — J849 Interstitial pulmonary disease, unspecified: Secondary | ICD-10-CM

## 2019-07-13 NOTE — Progress Notes (Signed)
Pt will not require re-testing prior to PFT study on Monday per hospital guidelines for COVID testing. Pt is aware and appointment has been cancelled.   Jacqlyn Larsen, RN   Status:  Final result  Visible to patient:  Yes (MyChart)  Next appt:  07/14/2019 at 09:30 AM in [facility] ([facility]) Specimen Information: Nasopharyngeal Swab      Ref Range & Units 1 mo ago  SARS Coronavirus 2 NEGATIVE POSITIVEAbnormal    Comment: (NOTE)  SARS-CoV-2 target nucleic acids are DETECTED.  The SARS-CoV-2 RNA is generally detectable in upper and lower  respiratory specimens during the acute phase of infection. Positive  results are indicative of the presence of SARS-CoV-2 RNA. Clinical  correlation with patient history and other diagnostic information is  necessary to determine patient infection status. Positive results do  not rule out bacterial infection or co-infection with other viruses.  The expected result is Negative.  Fact Sheet for Patients:  SugarRoll.be  Fact Sheet for Healthcare Providers:  https://www.woods-mathews.com/  This test is not yet approved or cleared by the [place] FDA and  has been authorized for detection and/or diagnosis of SARS-CoV-2 by  FDA under an Emergency Use Authorization (EUA). This EUA will remain  in effect (meaning this test can be used) for the duration of the  COVID-19 declaration under Section 564(b)(1) of the Act, 21 U.S.C.  section 360bbb-3(b)(1), unless the authorization is terminated or  revoked sooner.  Performed at [facility], [place] [address], [place], [place]  [address]   Resulting Agency  [facility] [place] [facility]      Specimen Collected: 05/27/19 14:07 Last Resulted: 05/27/19 21:28

## 2019-07-14 ENCOUNTER — Inpatient Hospital Stay (HOSPITAL_COMMUNITY)
Admission: RE | Admit: 2019-07-14 | Discharge: 2019-07-14 | Disposition: A | Discharge status: Home / Self Care | Payer: Medicare Other | Source: Ambulatory Visit | Attending: Pulmonary Disease | Admitting: Pulmonary Disease

## 2019-07-14 ENCOUNTER — Telehealth: Payer: Self-pay | Admitting: Pulmonary Disease

## 2019-07-14 MED FILL — BIKTARVY 50-200-25 MG TABS: 50-200-25 | 30 days supply | Qty: 30 | Fill #2

## 2019-07-14 NOTE — Telephone Encounter (Signed)
CTA chest results of come back showing interstitial lung disease. Keep follow-up with pulmonary function test. Also aortic arthrosclerosis. Patient should follow back up with primary care to further evaluate this. Repeat echocardiogram may be beneficial. Will defer this to primary care. Please route results to patient's primary care. Patient should also schedule a follow-up with them.  Keep upcoming appointments with breathing test.  Tyler Quaker, FNP  --------------  Spoke with pt, aware of results/recs.  Nothing further needed.

## 2019-07-17 ENCOUNTER — Telehealth: Payer: Self-pay | Admitting: Primary Care

## 2019-07-17 NOTE — Telephone Encounter (Signed)
Pt has PFT for TODAY at 4 pm.  Pt had pos COV on 3/6.  Was told he does NOT have to have a recent COV test since he had a positive a month ago.    Please verify if pt needs a recent COV test and let him know if he can come in for PFT today and f17f tomorrow.    Also, he is inquiring about a Codeine refill.

## 2019-07-17 NOTE — Telephone Encounter (Signed)
Spoke with June Leap (PFT team lead). States that pt has to be 90 days out from postive result before PFT can be done in office. Spoke with pt. He is aware of this information. His PFT, OV and COVID test have been rescheduled. Pt is also requesting a refill on Hycodan.  Dr. Loanne Drilling - please advise. Thanks.

## 2019-07-18 ENCOUNTER — Ambulatory Visit (INDEPENDENT_AMBULATORY_CARE_PROVIDER_SITE_OTHER): Payer: Medicare Other | Admitting: Pulmonary Disease

## 2019-07-18 ENCOUNTER — Encounter: Payer: Self-pay | Admitting: Pulmonary Disease

## 2019-07-18 ENCOUNTER — Ambulatory Visit: Payer: Medicare Other | Admitting: Primary Care

## 2019-07-18 ENCOUNTER — Other Ambulatory Visit: Payer: Self-pay

## 2019-07-18 ENCOUNTER — Telehealth: Payer: Self-pay | Admitting: Primary Care

## 2019-07-18 DIAGNOSIS — J849 Interstitial pulmonary disease, unspecified: Secondary | ICD-10-CM | POA: Insufficient documentation

## 2019-07-18 DIAGNOSIS — G4733 Obstructive sleep apnea (adult) (pediatric): Secondary | ICD-10-CM | POA: Diagnosis not present

## 2019-07-18 MED ORDER — HYDROCODONE-HOMATROPINE 5-1.5 MG/5ML PO SYRP: 5.0000 mL | ORAL_SOLUTION | Freq: Four times a day (QID) | ORAL | 0 refills | Status: DC | PRN

## 2019-07-18 NOTE — Progress Notes (Signed)
Virtual Visit via Telephone Note  I connected with Tyler Hendricks on 07/18/19 at 11:15 AM EDT by telephone and verified that I am speaking with the correct person using two identifiers.  Location: Patient: Home Provider: [facility]   I discussed the limitations, risks, security and privacy concerns of performing an evaluation and management service by telephone and the availability of in person appointments. I also discussed with the patient that there may be a patient responsible charge related to this service. The patient expressed understanding and agreed to proceed.   I discussed the assessment and treatment plan with the patient. The patient was provided an opportunity to ask questions and all were answered. The patient agreed with the plan and demonstrated an understanding of the instructions.   The patient was advised to call back or seek an in-person evaluation if the symptoms worsen or if the condition fails to improve as anticipated.  I provided 30 minutes of non-face-to-face time during this encounter.   Tyler Camba Rodman Pickle, MD   Subjective:   PATIENT ID: Tyler Hendricks GENDER: male DOB: 04-29-1950, MRN: [id number]  HPI  Chief Complaint  Patient presents with  . televisit    frequent dry coughing, no SOB or whezzing     Reason for Visit: Follow-up for cough  [name] is a 69 year old male former smoker with OSA on CPAP, hx HIV positivity since 1989 related to remote heroin use and prior COVID-19 infection in 02/2019 who presents for follow-up of chronic cough.  Synopsis: Patient has had chronic nonproductive cough that began 1.5 years ago that sporadically occurs throughout the day. Previously seen an allergist who attributed symptoms to mold and hair after performing skin allergy test. Cough improves with cough syrup and perles but recurs when stopping. Going outdoors and laying down on his back will aggravate his cough. Is not aware of any other  triggers. He is currently taking Symbicort twice a day which was started a month ago and does not feel like this helps. He did have COVID-19 infection in the winter that seemed to exacerbate his cough. In December 2020, his PCP obtained CT chest for his cough which demonstrated bibasilar interstitial infiltrates and he was referred to Pulmonary for further evaluation.  Interval He was last seen with me on 04/12/19 and we reviewed his scan at that time. We had discussed that the findings were concerning for early ILD vs post-viral changed related to COVID-19. He continues to have persistent intermittently productive cough that is unchanged in character. Associated with chest pain related to his forceful cough. Hycodan is the only thing that works. Since our last visit, he has had telephone notes and visit with the Pulmonary NP for his cough. He recently left his cough syrup bottle during a trip and requesting a refill.  Unfortunately, we have been unable to complete PFTs due to +COVID testing and will need to delay testing. He did however get his HRCT completed which demonstrated unchanged interseptal thickening, GGO and bronchiectasis that suggest ILD with probable UIP pattern.  Social History: Former smoker. Reports 2ppd x 15 years. Quit in 1994.   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  Past Medical History:  Diagnosis Date  . Abnormal result of cardiovascular function study, unspecified   . Atherosclerotic heart disease of native coronary artery without angina pectoris   .  CAD (coronary artery disease)   . Coronary angioplasty status   . Diabetes mellitus without complication ([place])   . Essential (primary) hypertension   . Gallstones   . Hepatic cirrhosis ([place])   . Hepatitis C   . HIV infection ([place])   . Hyperlipidemia     . Hypertension   . Internal hemorrhoids   . Kidney stones   . Mixed hyperlipidemia   . Palpitations   . Sleep apnea    CPAP  . Tubular adenoma of colon      Family History  Problem Relation Age of Onset  . Emphysema Mother        pulmonary  . Heart attack Father 80  . Lung cancer Sister   . Esophageal cancer Brother   . Esophageal cancer Brother   . Colon cancer Neg Hx   . Inflammatory bowel disease Neg Hx   . Liver disease Neg Hx   . Pancreatic cancer Neg Hx   . Rectal cancer Neg Hx   . Stomach cancer Neg Hx   . Colon polyps Neg Hx      Social History   Occupational History  . Occupation: retired  Tobacco Use  . Smoking status: Former Smoker    Types: Cigarettes    Quit date: 1994    Years since quitting: 27.3  . Smokeless tobacco: Never Used  Substance and Sexual Activity  . Alcohol use: Yes    Comment: social-occ beer  . Drug use: No  . Sexual activity: Yes    Allergies  Allergen Reactions  . Lopressor [Metoprolol Tartrate] Rash and Other (See Comments)    Bleeding (non-specific)   . Integrilin [Eptifibatide] Other (See Comments)    Bleeding (non-specific)     Outpatient Medications Prior to Visit  Medication Sig Dispense Refill  . amLODipine (NORVASC) 2.5 MG tablet Take 2.5 mg by mouth daily.    Marland Kitchen aspirin 81 MG tablet Take 81 mg by mouth daily.    Marland Kitchen atenolol (TENORMIN) 50 MG tablet Take 50 mg by mouth daily.    . benzonatate (TESSALON) 100 MG capsule Take 2 capsules (200 mg total) by mouth 3 (three) times daily. 30 capsule 1  . bictegravir-emtricitabine-tenofovir AF (BIKTARVY) 50-200-25 MG TABS tablet Take 1 tablet by mouth daily. 30 tablet 5  . clopidogrel (PLAVIX) 75 MG tablet TAKE 1 TABLET(75 MG) BY MOUTH DAILY 90 tablet 3  . HYDROcodone-homatropine (HYCODAN) 5-1.5 MG/5ML syrup Take 5 mLs by mouth every 6 (six) hours as needed for cough. 473 mL 0  . losartan-hydrochlorothiazide (HYZAAR) 100-25 MG tablet Take 1 tablet by mouth daily.   3  .  methocarbamol (ROBAXIN) 500 MG tablet Take 1 tablet (500 mg total) by mouth 2 (two) times daily as needed for muscle spasms. 60 tablet 2  . Multiple Vitamin (MULTIVITAMIN) tablet Take 1 tablet by mouth daily.    . rosuvastatin (CRESTOR) 10 MG tablet Take 1 tablet (10 mg total) by mouth daily. 90 tablet 1  . sitaGLIPtin (JANUVIA) 100 MG tablet Take 100 mg by mouth daily.    Marland Kitchen SYNJARDY XR 12.07-998 MG TB24 TK 2 TS PO QD  3   No facility-administered medications prior to visit.    Review of Systems  Constitutional: Negative for chills, diaphoresis, fever, malaise/fatigue and weight loss.  HENT: Negative for congestion.   Respiratory: Positive for cough. Negative for hemoptysis, sputum production, shortness of breath and wheezing.   Cardiovascular: Positive for chest pain. Negative for palpitations and leg swelling.  Objective:   There were no vitals filed for this visit.    Physical Exam: No apparent distress on the phone  Data Reviewed:  Imaging: CT Chest 03/03/19 -  Subpleural ground glass opacities and reticular changes without honeycombing.  CT Chest 07/13/19 - unchanged subpleural interseptal thickening, GGO and bronchiectasis. No honeycombing. Suggestive of ILD with probable UIP pattern.  PFT: None on file    Assessment & Plan:   Discussion: 69 year old male former smoker with history of well-controlled HIV who initially presented for history of long standing cough and recent COVID-19 infection in 02/2019 associated with chest imaging concerning for early interstitial lung disease vs post-viral changes secondary to viral pneumonia. His symptoms and chest imaging remain persistent. I discussed in detail with the patient over the phone regarding the lung changes noted on his CT scan. We discussed that additional work-up be completed for possible ILD/UIP. I have discussed his case with Dr. Chase Caller and will arrange for him to be seen in his ILD clinic. He would likely benefit  from bronchoscopy with biopsy and other testing/chest imaging/PFTs to monitor for potential progression of his respiratory symptoms.  Interstitial lung disease --Will send telephone encounter to Dr. Chase Caller to schedule patient for ILD clinic/evaluate for bronchoscopy --Will hold off on steroids for now after discussing case with Dr. Chase Caller --Patient scheduled for PFTs in June --Will send RX hycodan for cough. I have counseled patient that his cough is likely related to his ILD and that this cannot be a long term solution for his symptoms as this medication does contain a narcotic. He expressed understanding.  Severe OSA on CPAP --Followed by Sleep with Neurology --Continue non-invasive ventilation nightly --Avoid driving when sleepy  Health Maintenance Immunization History  Administered Date(s) Administered  . Fluad Quad(high Dose 65+) 11/17/2018  . Influenza, High Dose Seasonal PF 12/20/2017, 11/17/2018  . PFIZER SARS-COV-2 Vaccination 05/18/2019, 06/14/2019  . Tdap 09/07/2018  . Zoster Recombinat (Shingrix) 01/06/2017   CT Lung Screen - not qualified  No orders of the defined types were placed in this encounter.  Meds ordered this encounter  Medications  . HYDROcodone-homatropine (HYCODAN) 5-1.5 MG/5ML syrup    Sig: Take 5 mLs by mouth every 6 (six) hours as needed for cough.    Dispense:  473 mL    Refill:  0   Return for ILD with Dr. Chase Caller when next available..  Tayron Hunnell Rodman Pickle, MD [facility] 07/18/2019 11:10 AM  Office Number [phone]

## 2019-07-18 NOTE — Telephone Encounter (Signed)
Spoke with pt. He has been scheduled for a televisit at 88 with Dr. Loanne Drilling. Nothing further needed.

## 2019-07-18 NOTE — Patient Instructions (Signed)
  Interstitial lung disease --Will send telephone encounter to Dr. Chase Caller to schedule patient for ILD clinic/evaluate for bronchoscopy --Will hold off on steroids for now after discussing case with Dr. Chase Caller --Patient scheduled for PFTs in June --Will send RX hycodan for cough.

## 2019-07-18 NOTE — Telephone Encounter (Signed)
Pt was called yesterday about some of his appointments. While on the phone with him, he requested a refill on Hycodan. This encounter was sent to Dr. Loanne Drilling to advised but we did not get a response back. Will route new encounter to Dr. Loanne Drilling to advise.

## 2019-07-18 NOTE — Telephone Encounter (Signed)
Please contact and arrange for patient to be added on as a telephone visit today. We need to discuss his CT Chest and his cough.

## 2019-07-19 ENCOUNTER — Telehealth: Payer: Self-pay | Admitting: Interventional Cardiology

## 2019-07-19 NOTE — Telephone Encounter (Signed)
Patient sent an appointment request stating he recently had a CT done with his Pulmonologist. He states the findings indicated Interstitial Lung disease, aortic calcifications and aortic valve issue and recommended a follow up with his Cardiologist. He is and having PFT's 6/11. He is scheduled to see Dr. Irish Lack 09/04/2019.

## 2019-07-19 NOTE — Telephone Encounter (Signed)
Called and spoke to the patient. He states that his pulmonologist wanted him to be seen due to aortic calcifications seen on CT scan. Patient has PFTs scheduled on 6/11 and prefers to wait to see Dr. Irish Lack after his PFTs. He denies having any cardiac complaints at this time and will let us know if anything changes.

## 2019-08-12 ENCOUNTER — Other Ambulatory Visit (HOSPITAL_COMMUNITY): Payer: Medicare Other

## 2019-08-13 NOTE — Progress Notes (Signed)
Cardiology Office Note   Date:  08/14/2019   ID:  Tyler Hendricks, DOB 07/04/1950, MRN [id number]  PCP:  Hoyt Koch, MD    No chief complaint on file.  CAD  Wt Readings from Last 3 Encounters:  08/14/19 217 lb (98.4 kg)  06/19/19 226 lb (102.5 kg)  04/26/19 225 lb 12.8 oz (102.4 kg)       History of Present Illness: Tyler Hendricks is a 69 y.o. male  Who hasmultivessel stents placed in 2000 while in [place]. He has Ah/o HIV as well, since 1989. He is compliant with meds. Viral load is nondetectable.   OM1 3.0 x 23 mm, left posterolateral 2.5 x 13 mm, diagonal 1 2.5 x 13, ostial diagonal 2.5 mm, proximal LAD- unknown size, mid LAD 3.0 x 13 - all BMS.   Last cath was 2005. All stents were patent and there was moderate disease in the right PDA and right PLA.  In 2010, he had a monitor showing NSR with PVCs. He was diagnosed with moderate aortic stenosis in 2018 by echocardiogram while in [place].  He has had issues with leg cramps.  In the past, it was noted that he walks several times a day.  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  During COVID-19 quarantine, he gained weight.  He has since lost some of that weight.  Denies : Chest pain. Dizziness. Leg edema. Nitroglycerin use. Orthopnea. Palpitations. Paroxysmal nocturnal dyspnea. Shortness of breath. Syncope.   Got his COVID vaccines.     Past Medical History:  Diagnosis Date  . Abnormal result of cardiovascular function study, unspecified   . Atherosclerotic heart disease of native coronary artery without angina pectoris   . CAD (coronary artery disease)   . Coronary angioplasty status   . Diabetes mellitus without complication ([place])   . Essential (primary) hypertension   . Gallstones   . Hepatic cirrhosis ([place])   . Hepatitis C   . HIV infection ([place])   . Hyperlipidemia   . Hypertension   . Internal hemorrhoids   . Kidney stones   . Mixed hyperlipidemia   .  Palpitations   . Sleep apnea    CPAP  . Tubular adenoma of colon     Past Surgical History:  Procedure Laterality Date  . CARDIAC CATHETERIZATION Left 04/2013  . chlecystectomy    . COLONOSCOPY  05/02/2013  . PERCUTANEOUS CORONARY STENT INTERVENTION (PCI-S)    . POLYPECTOMY    . UMBILICAL HERNIA REPAIR       Current Outpatient Medications  Medication Sig Dispense Refill  . amLODipine (NORVASC) 2.5 MG tablet Take 2.5 mg by mouth daily.    Marland Kitchen aspirin 81 MG tablet Take 81 mg by mouth daily.    Marland Kitchen atenolol (TENORMIN) 50 MG tablet Take 50 mg by mouth daily.    . benzonatate (TESSALON) 100 MG capsule Take 2 capsules (200 mg total) by mouth 3 (three) times daily. 30 capsule 1  . bictegravir-emtricitabine-tenofovir AF (BIKTARVY) 50-200-25 MG TABS tablet Take 1 tablet by mouth daily. 30 tablet 5  . clopidogrel (PLAVIX) 75 MG tablet TAKE 1 TABLET(75 MG) BY MOUTH DAILY 90 tablet 3  . HYDROcodone-homatropine (HYCODAN) 5-1.5 MG/5ML syrup Take 5 mLs by mouth every 6 (six) hours as needed for cough. 473 mL 0  . losartan-hydrochlorothiazide (HYZAAR) 100-25 MG tablet Take 1 tablet by mouth daily.   3  . methocarbamol (ROBAXIN) 500 MG tablet Take 1 tablet (  500 mg total) by mouth 2 (two) times daily as needed for muscle spasms. 60 tablet 2  . Multiple Vitamin (MULTIVITAMIN) tablet Take 1 tablet by mouth daily.    . rosuvastatin (CRESTOR) 10 MG tablet Take 1 tablet (10 mg total) by mouth daily. 90 tablet 1  . sitaGLIPtin (JANUVIA) 100 MG tablet Take 100 mg by mouth daily.    Marland Kitchen SYNJARDY XR 12.07-998 MG TB24 TK 2 TS PO QD  3   No current facility-administered medications for this visit.    Allergies:   Lopressor [metoprolol tartrate] and Integrilin [eptifibatide]    Social History:  The patient  reports that he quit smoking about 27 years ago. His smoking use included cigarettes. He started smoking about 47 years ago. He has a 40.00 pack-year smoking history. He has never used smokeless tobacco. He  reports current alcohol use. He reports that he does not use drugs.   Family History:  The patient's family history includes Emphysema in his mother; Esophageal cancer in his brother and brother; Heart attack (age of onset: 45) in his father; Lung cancer in his sister.    ROS:  Please see the history of present illness.   Otherwise, review of systems are positive for nuisance bleeding when he plays with his dog.   All other systems are reviewed and negative.    PHYSICAL EXAM: VS:  BP (!) 106/52   Pulse 62   Ht 6' (1.829 m)   Wt 217 lb (98.4 kg)   SpO2 91%   BMI 29.43 kg/m  , BMI Body mass index is 29.43 kg/m. GEN: Well nourished, well developed, in no acute distress  HEENT: normal  Neck: no JVD, carotid bruits, or masses Cardiac: RRR; 2/6 systolic murmur, no rubs, or gallops,no edema  Respiratory:  clear to auscultation bilaterally, normal work of breathing GI: soft, nontender, nondistended, + BS MS: no deformity or atrophy , 2+ PT pulses bilaterally Skin: warm and dry, no rash Neuro:  Strength and sensation are intact Psych: euthymic mood, full affect   EKG:   The ekg ordered 10/20 demonstrates NSR RBBB   Recent Labs: 03/02/2019: Hemoglobin 16.8; Magnesium 1.8; Platelets 105.0 05/23/2019: ALT 24; BUN 16; Creat 0.76; Potassium 4.6; Sodium 140   Lipid Panel    Component Value Date/Time   CHOL 149 11/09/2018 0948   TRIG 64 11/09/2018 0948   HDL 72 11/09/2018 0948   CHOLHDL 2.1 11/09/2018 0948   LDLCALC 63 11/09/2018 0948     Other studies Reviewed: Additional studies/ records that were reviewed today with results demonstrating: PMD labs reviewed.   ASSESSMENT AND PLAN:  1. CAD: Continue aggressive secondary prevention.  Since he notes nuisance bleeding, stop aspirin and continue clopidogrel mono- therapy. 2. Hypertension: Low-salt diet.  Whole food, plant-based diet recommended. The current medical regimen is effective;  continue present plan and medications. 3.  Diabetes: Dietary advice given.  Increase fiber intake.  Reduce sugary drink intake.  COntinue with weight loss.  4. Aortic stenosis noted in the past.  Moderate in 2018 by echocardiogram while in [place].   5. HIV: Managed per infectious disease. Viral load undetectable per his report.   Current medicines are reviewed at length with the patient today.  The patient concerns regarding his medicines were addressed.  The following changes have been made:  No change  Labs/ tests ordered today include:  No orders of the defined types were placed in this encounter.   Recommend 150 minutes/week of aerobic exercise  Low fat, low carb, high fiber diet recommended  Disposition:   FU in 1 year   Signed, Larae Grooms, MD  08/14/2019 11:42 AM    [facility], [place], [place]  [address] Phone: [phone]; Fax: [phone]

## 2019-08-14 ENCOUNTER — Encounter: Payer: Self-pay | Admitting: Interventional Cardiology

## 2019-08-14 ENCOUNTER — Other Ambulatory Visit: Payer: Self-pay

## 2019-08-14 ENCOUNTER — Ambulatory Visit (INDEPENDENT_AMBULATORY_CARE_PROVIDER_SITE_OTHER): Payer: Medicare Other | Admitting: Interventional Cardiology

## 2019-08-14 ENCOUNTER — Telehealth: Payer: Self-pay | Admitting: Pulmonary Disease

## 2019-08-14 VITALS — BP 106/52 | HR 62 | Ht 72.0 in | Wt 217.0 lb

## 2019-08-14 DIAGNOSIS — E1165 Type 2 diabetes mellitus with hyperglycemia: Secondary | ICD-10-CM

## 2019-08-14 DIAGNOSIS — I350 Nonrheumatic aortic (valve) stenosis: Secondary | ICD-10-CM | POA: Diagnosis not present

## 2019-08-14 DIAGNOSIS — I25118 Atherosclerotic heart disease of native coronary artery with other forms of angina pectoris: Secondary | ICD-10-CM

## 2019-08-14 DIAGNOSIS — I10 Essential (primary) hypertension: Secondary | ICD-10-CM

## 2019-08-14 DIAGNOSIS — B20 Human immunodeficiency virus [HIV] disease: Secondary | ICD-10-CM

## 2019-08-14 NOTE — Telephone Encounter (Signed)
Not s virtual or actual ov with me or NP 1st avail   Delsym is strongest non-narcotic cough med and available otc

## 2019-08-14 NOTE — Telephone Encounter (Signed)
Called pt and advised message from the provider. Pt understood and verbalized understanding. Nothing further is needed.   Appt made with Aaron Edelman.

## 2019-08-14 NOTE — Telephone Encounter (Signed)
Please also make sure the patient is scheduled for next available with Dr. Chase Caller in ILD clinic.  Per Dr. Cordelia Pen last note in April/2021 patient was intended to be scheduled in an ILD clinic slot with Dr. Chase Caller your Dr. Vaughan Browner could be considered.  Patient may be candidate for bronchoscopy.  Patient has pulmonary function test scheduled for mid June. Thi should be in office.   Triage,   Please contact the patient and schedule accordingly    Wyn Quaker, FNP

## 2019-08-14 NOTE — Patient Instructions (Signed)
Medication Instructions:  Your physician has recommended you make the following change in your medication:   1. STOP: Aspirin  2. CONTINUE: clopidogrel (plavix) 75 mg tablet once a day  *If you need a refill on your cardiac medications before your next appointment, please call your pharmacy*   Lab Work: None ordered  If you have labs (blood work) drawn today and your tests are completely normal, you will receive your results only by: Marland Kitchen MyChart Message (if you have MyChart) OR . A paper copy in the mail If you have any lab test that is abnormal or we need to change your treatment, we will call you to review the results.   Testing/Procedures: None ordered   Follow-Up: At [facility], you and your health needs are our priority.  As part of our continuing mission to provide you with exceptional heart care, we have created designated Provider Care Teams.  These Care Teams include your primary Cardiologist (physician) and Advanced Practice Providers (APPs -  Physician Assistants and Nurse Practitioners) who all work together to provide you with the care you need, when you need it.  We recommend signing up for the patient portal called "MyChart".  Sign up information is provided on this After Visit Summary.  MyChart is used to connect with patients for Virtual Visits (Telemedicine).  Patients are able to view lab/test results, encounter notes, upcoming appointments, etc.  Non-urgent messages can be sent to your provider as well.   To learn more about what you can do with MyChart, go to [email or web address].    Your next appointment:   12 month(s)  The format for your next appointment:   In Person  Provider:   You may see Larae Grooms, MD or one of the following Advanced Practice Providers on your designated Care Team:    Melina Copa, PA-C  Ermalinda Barrios, PA-C    Other Instructions  High-Fiber Diet Fiber, also called dietary fiber, is a type of carbohydrate that is  found in fruits, vegetables, whole grains, and beans. A high-fiber diet can have many health benefits. Your health care provider may recommend a high-fiber diet to help:  Prevent constipation. Fiber can make your bowel movements more regular.  Lower your cholesterol.  Relieve the following conditions: ? Swelling of veins in the anus (hemorrhoids). ? Swelling and irritation (inflammation) of specific areas of the digestive tract (uncomplicated diverticulosis). ? A problem of the large intestine (colon) that sometimes causes pain and diarrhea (irritable bowel syndrome, IBS).  Prevent overeating as part of a weight-loss plan.  Prevent heart disease, type 2 diabetes, and certain cancers. What is my plan? The recommended daily fiber intake in grams (g) includes:  38 g for men age 38 or younger.  30 g for men over age 3.  44 g for women age 51 or younger.  21 g for women over age 68. You can get the recommended daily intake of dietary fiber by:  Eating a variety of fruits, vegetables, grains, and beans.  Taking a fiber supplement, if it is not possible to get enough fiber through your diet. What do I need to know about a high-fiber diet?  It is better to get fiber through food sources rather than from fiber supplements. There is not a lot of research about how effective supplements are.  Always check the fiber content on the nutrition facts label of any prepackaged food. Look for foods that contain 5 g of fiber or more per serving.  Talk with  a diet and nutrition specialist (dietitian) if you have questions about specific foods that are recommended or not recommended for your medical condition, especially if those foods are not listed below.  Gradually increase how much fiber you consume. If you increase your intake of dietary fiber too quickly, you may have bloating, cramping, or gas.  Drink plenty of water. Water helps you to digest fiber. What are tips for following this  plan?  Eat a wide variety of high-fiber foods.  Make sure that half of the grains that you eat each day are whole grains.  Eat breads and cereals that are made with whole-grain flour instead of refined flour or white flour.  Eat brown rice, bulgur wheat, or millet instead of white rice.  Start the day with a breakfast that is high in fiber, such as a cereal that contains 5 g of fiber or more per serving.  Use beans in place of meat in soups, salads, and pasta dishes.  Eat high-fiber snacks, such as berries, raw vegetables, nuts, and popcorn.  Choose whole fruits and vegetables instead of processed forms like juice or sauce. What foods can I eat?  Fruits Berries. Pears. Apples. Oranges. Avocado. Prunes and raisins. Dried figs. Vegetables Sweet potatoes. Spinach. Kale. Artichokes. Cabbage. Broccoli. Cauliflower. Green peas. Carrots. Squash. Grains Whole-grain breads. Multigrain cereal. Oats and oatmeal. Brown rice. Barley. Bulgur wheat. [place]. Quinoa. Bran muffins. Popcorn. Rye wafer crackers. Meats and other proteins Navy, kidney, and pinto beans. Soybeans. Split peas. Lentils. Nuts and seeds. Dairy Fiber-fortified yogurt. Beverages Fiber-fortified soy milk. Fiber-fortified orange juice. Other foods Fiber bars. The items listed above may not be a complete list of recommended foods and beverages. Contact a dietitian for more options. What foods are not recommended? Fruits Fruit juice. Cooked, strained fruit. Vegetables Fried potatoes. Canned vegetables. Well-cooked vegetables. Grains White bread. Pasta made with refined flour. White rice. Meats and other proteins Fatty cuts of meat. Fried chicken or fried fish. Dairy Milk. Yogurt. Cream cheese. Sour cream. Fats and oils Butters. Beverages Soft drinks. Other foods Cakes and pastries. The items listed above may not be a complete list of foods and beverages to avoid. Contact a dietitian for more  information. Summary  Fiber is a type of carbohydrate. It is found in fruits, vegetables, whole grains, and beans.  There are many health benefits of eating a high-fiber diet, such as preventing constipation, lowering blood cholesterol, helping with weight loss, and reducing your risk of heart disease, diabetes, and certain cancers.  Gradually increase your intake of fiber. Increasing too fast can result in cramping, bloating, and gas. Drink plenty of water while you increase your fiber.  The best sources of fiber include whole fruits and vegetables, whole grains, nuts, seeds, and beans. This information is not intended to replace advice given to you by your health care provider. Make sure you discuss any questions you have with your health care provider. Document Revised: 01/11/2017 Document Reviewed: 01/11/2017 Elsevier Patient Education  2020 [employer].

## 2019-08-14 NOTE — Telephone Encounter (Signed)
Hycodan last refilled 4.27.21 35mL q6h prn, #45mL by Dr Loanne Drilling Last office visit (virtual) same day with Dr Loanne Drilling:  Assessment & Plan:    Discussion: 69 year old male former smoker with history of well-controlled HIV who initially presented for history of long standing cough and recent COVID-19 infection in 02/2019 associated with chest imaging concerning for early interstitial lung disease vs post-viral changes secondary to viral pneumonia. His symptoms and chest imaging remain persistent. I discussed in detail with the patient over the phone regarding the lung changes noted on his CT scan. We discussed that additional work-up be completed for possible ILD/UIP. I have discussed his case with Dr. Chase Caller and will arrange for him to be seen in his ILD clinic. He would likely benefit from bronchoscopy with biopsy and other testing/chest imaging/PFTs to monitor for potential progression of his respiratory symptoms.   Interstitial lung disease --Will send telephone encounter to Dr. Chase Caller to schedule patient for ILD clinic/evaluate for bronchoscopy --Will hold off on steroids for now after discussing case with Dr. Chase Caller --Patient scheduled for PFTs in June --Will send RX hycodan for cough. I have counseled patient that his cough is likely related to his ILD and that this cannot be a long term solution for his symptoms as this medication does contain a narcotic. He expressed understanding.   Severe OSA on CPAP --Followed by Sleep with Neurology --Continue non-invasive ventilation nightly --Avoid driving when sleepy     Called spoke with patient who is requesting a refill on his Hycodan cough syrup Cough remains the same, finished the previous bottle this morning Majority of the time cough is a dry hacking cough  [facility] on [address]  Dr Loanne Drilling is not in the office - will route to Dr Melvyn Novas to advise per office protocol.

## 2019-08-15 NOTE — Telephone Encounter (Signed)
Dr. Chase Caller does not have any open ILD clinic appointments at this time. I have placed a recall for this appointment.

## 2019-08-16 ENCOUNTER — Ambulatory Visit: Payer: Medicare Other | Admitting: Primary Care

## 2019-08-17 MED FILL — BIKTARVY 50-200-25 MG TABS: 50-200-25 | 30 days supply | Qty: 30 | Fill #3

## 2019-08-18 ENCOUNTER — Other Ambulatory Visit: Payer: Self-pay

## 2019-08-18 ENCOUNTER — Encounter: Payer: Self-pay | Admitting: Pulmonary Disease

## 2019-08-18 ENCOUNTER — Ambulatory Visit (INDEPENDENT_AMBULATORY_CARE_PROVIDER_SITE_OTHER): Payer: Medicare Other | Admitting: Pulmonary Disease

## 2019-08-18 VITALS — BP 138/82 | HR 68 | Temp 98.2°F | Ht 72.0 in | Wt 216.6 lb

## 2019-08-18 DIAGNOSIS — J849 Interstitial pulmonary disease, unspecified: Secondary | ICD-10-CM

## 2019-08-18 DIAGNOSIS — Z8616 Personal history of COVID-19: Secondary | ICD-10-CM | POA: Diagnosis not present

## 2019-08-18 DIAGNOSIS — G4733 Obstructive sleep apnea (adult) (pediatric): Secondary | ICD-10-CM

## 2019-08-18 DIAGNOSIS — B20 Human immunodeficiency virus [HIV] disease: Secondary | ICD-10-CM

## 2019-08-18 DIAGNOSIS — R05 Cough: Secondary | ICD-10-CM

## 2019-08-18 DIAGNOSIS — R053 Chronic cough: Secondary | ICD-10-CM

## 2019-08-18 MED ORDER — BENZONATATE 200 MG PO CAPS: 200.0000 mg | ORAL_CAPSULE | Freq: Three times a day (TID) | ORAL | 3 refills | Status: DC | PRN

## 2019-08-18 MED ORDER — HYDROCODONE-HOMATROPINE 5-1.5 MG/5ML PO SYRP: 5.0000 mL | ORAL_SOLUTION | Freq: Four times a day (QID) | ORAL | 0 refills | Status: DC | PRN

## 2019-08-18 NOTE — Assessment & Plan Note (Signed)
Plan: Complete pulmonary function test in June/2021 as scheduled

## 2019-08-18 NOTE — Assessment & Plan Note (Signed)
Reviewed high-resolution CT chest with patient Patient with dry inspiratory cough Previous notes specifically request for the patient to be established with Dr. Chase Caller, this has not been completed yet Schedules were reviewed Dr. Chase Caller with no open availabilities and no schedule out for July/2021  Plan: Schedule patient today for ILD consult with Dr. Vaughan Browner on 10/03/2019 ILD questionnaire given to patient Patient to complete pulmonary function testing in June/2021 as scheduled

## 2019-08-18 NOTE — Assessment & Plan Note (Signed)
Plan: Continue to follow-up with neurology Continue NIV at night

## 2019-08-18 NOTE — Progress Notes (Signed)
@Patient  ID: Tyler Hendricks, male    DOB: October 29, 1950, 69 y.o.   MRN: [id number]  Chief Complaint  Patient presents with  . Follow-up    F/U on cough. Still has a cough and wants a refill on his hycodan cough syrup.     Referring provider: Hoyt Koch, *  HPI:  69 year old male former smoker initially referred to our office in January/2021 for chronic cough  Past medical history status post Covid December/2020, HIV, severe obstructive sleep apnea managed on NIV, hypertension, type 2 diabetes, cirrhosis of the liver Smoking history: Former smoker.  Quit 1994.  40-pack-year smoking history Maintenance: none Patient of Dr. Loanne Drilling  08/18/2019  - Visit   69 year old male former smoker initially referred to our office in January/2021 for chronic cough.  Patient status post COVID-19 infection in December/2020.  Patient also has a significant past medical history for severe obstructive sleep apnea managed on NIV as well as HIV, cirrhosis of the liver.  Patient is followed by Dr. Loanne Drilling.  Patient was last seen in our office in April/2021 by Dr. Loanne Drilling.  This was a virtual visit.  At that time it was recommended for patient to be scheduled for follow-up with Dr. Chase Caller in ILD clinic to evaluate for bronchoscopy.  Patient was encouraged to complete pulmonary function testing as scheduled.  And a refill of Hycodan cough syrup was sent in.  Patient was encouraged to remain with neurology and to continue to utilize noninvasive ventilation nightly.  Patient presenting to office today confused on why he continues to have this chronic cough.  He is also unsure why he needs to have appointments in order to review his Hycodan use.  We will discuss this today.  Questionaires / Pulmonary Flowsheets:   MMRC: mMRC Dyspnea Scale mMRC Score  08/18/2019 0    Tests:   03/04/2019-CT chest without contrast-coronary artery calcifications are noted suggesting coronary artery disease, hepatic  cirrhosis is noted with multiple enlarged collateral veins consistent with portal hypertension, mild bibasilar interstitial densities are noted, right greater than left most consistent with scarring, aortic arthrosclerosis  02/26/20 - covid positive   FENO:  No results found for: NITRICOXIDE  PFT: No flowsheet data found.  WALK:  SIX MIN WALK 04/12/2019  Supplimental Oxygen during Test? (L/min) No  Tech Comments: Patient walked moderate pace and maintained good sats.    Imaging: No results found.  Lab Results:  CBC    Component Value Date/Time   WBC 3.7 (L) 03/02/2019 1009   RBC 5.05 03/02/2019 1009   HGB 16.8 03/02/2019 1009   HCT 48.1 03/02/2019 1009   PLT 105.0 (L) 03/02/2019 1009   MCV 95.3 03/02/2019 1009   MCH 32.6 11/09/2018 0948   MCHC 34.8 03/02/2019 1009   RDW 14.1 03/02/2019 1009   LYMPHSABS 1.6 09/07/2018 1048   MONOABS 0.5 09/07/2018 1048   EOSABS 0.2 09/07/2018 1048   BASOSABS 0.1 09/07/2018 1048    BMET    Component Value Date/Time   NA 140 05/23/2019 0942   K 4.6 05/23/2019 0942   CL 104 05/23/2019 0942   CO2 28 05/23/2019 0942   GLUCOSE 171 (H) 05/23/2019 0942   BUN 16 05/23/2019 0942   CREATININE 0.76 05/23/2019 0942   CALCIUM 9.7 05/23/2019 0942   GFRNONAA 98 04/02/2017 1058   GFRAA 114 04/02/2017 1058    BNP No results found for: BNP  ProBNP No results found for: PROBNP  Specialty Problems      Pulmonary Problems  Extrinsic asthma without complication   Obstructive sleep apnea   Chronic cough   Interstitial pulmonary disease (HCC)      Allergies  Allergen Reactions  . Lopressor [Metoprolol Tartrate] Rash and Other (See Comments)    Bleeding (non-specific)   . Integrilin [Eptifibatide] Other (See Comments)    Bleeding (non-specific)    Immunization History  Administered Date(s) Administered  . Fluad Quad(high Dose 65+) 11/17/2018  . Influenza, High Dose Seasonal PF 12/20/2017, 11/17/2018  . PFIZER SARS-COV-2  Vaccination 05/18/2019, 06/14/2019  . Tdap 09/07/2018  . Zoster Recombinat (Shingrix) 01/06/2017    Past Medical History:  Diagnosis Date  . Abnormal result of cardiovascular function study, unspecified   . Atherosclerotic heart disease of native coronary artery without angina pectoris   . CAD (coronary artery disease)   . Coronary angioplasty status   . Diabetes mellitus without complication ([place])   . Essential (primary) hypertension   . Gallstones   . Hepatic cirrhosis ([place])   . Hepatitis C   . HIV infection ([place])   . Hyperlipidemia   . Hypertension   . Internal hemorrhoids   . Kidney stones   . Mixed hyperlipidemia   . Palpitations   . Sleep apnea    CPAP  . Tubular adenoma of colon     Tobacco History: Social History   Tobacco Use  Smoking Status Former Smoker  . Packs/day: 2.00  . Years: 20.00  . Pack years: 40.00  . Types: Cigarettes  . Start date: 63  . Quit date: 27  . Years since quitting: 27.4  Smokeless Tobacco Never Used   Counseling given: Yes   Continue to not smoke  Outpatient Encounter Medications as of 08/18/2019  Medication Sig  . amLODipine (NORVASC) 2.5 MG tablet Take 2.5 mg by mouth daily.  Marland Kitchen atenolol (TENORMIN) 50 MG tablet Take 50 mg by mouth daily.  . bictegravir-emtricitabine-tenofovir AF (BIKTARVY) 50-200-25 MG TABS tablet Take 1 tablet by mouth daily.  . clopidogrel (PLAVIX) 75 MG tablet TAKE 1 TABLET(75 MG) BY MOUTH DAILY  . HYDROcodone-homatropine (HYCODAN) 5-1.5 MG/5ML syrup Take 5 mLs by mouth every 6 (six) hours as needed for cough.  . losartan-hydrochlorothiazide (HYZAAR) 100-25 MG tablet Take 1 tablet by mouth daily.   . methocarbamol (ROBAXIN) 500 MG tablet Take 1 tablet (500 mg total) by mouth 2 (two) times daily as needed for muscle spasms.  . Multiple Vitamin (MULTIVITAMIN) tablet Take 1 tablet by mouth daily.  . rosuvastatin (CRESTOR) 10 MG tablet Take 1 tablet (10 mg total) by mouth daily.  . sitaGLIPtin (JANUVIA)  100 MG tablet Take 100 mg by mouth daily.  Marland Kitchen SYNJARDY XR 12.07-998 MG TB24 TK 2 TS PO QD  . [DISCONTINUED] HYDROcodone-homatropine (HYCODAN) 5-1.5 MG/5ML syrup Take 5 mLs by mouth every 6 (six) hours as needed for cough.  . benzonatate (TESSALON) 200 MG capsule Take 1 capsule (200 mg total) by mouth 3 (three) times daily as needed for cough.  . [DISCONTINUED] aspirin 81 MG tablet Take 81 mg by mouth daily.  . [DISCONTINUED] benzonatate (TESSALON) 100 MG capsule Take 2 capsules (200 mg total) by mouth 3 (three) times daily.   No facility-administered encounter medications on file as of 08/18/2019.     Review of Systems  Review of Systems  Constitutional: Positive for fatigue. Negative for activity change, chills, fever and unexpected weight change.  HENT: Negative for postnasal drip, rhinorrhea, sinus pressure, sinus pain and sore throat.   Eyes: Negative.   Respiratory: Positive for  cough. Negative for shortness of breath and wheezing.   Cardiovascular: Negative for chest pain and palpitations.  Gastrointestinal: Negative for constipation, diarrhea, nausea and vomiting.  Endocrine: Negative.   Genitourinary: Negative.   Musculoskeletal: Negative.   Skin: Negative.   Neurological: Negative for dizziness and headaches.  Psychiatric/Behavioral: Negative.  Negative for dysphoric mood. The patient is not nervous/anxious.   All other systems reviewed and are negative.    Physical Exam  BP 138/82   Pulse 68   Temp 98.2 F (36.8 C) (Oral)   Ht 6' (1.829 m)   Wt 216 lb 9.6 oz (98.2 kg)   SpO2 98% Comment: on RA  BMI 29.38 kg/m   Wt Readings from Last 5 Encounters:  08/18/19 216 lb 9.6 oz (98.2 kg)  08/14/19 217 lb (98.4 kg)  06/19/19 226 lb (102.5 kg)  04/26/19 225 lb 12.8 oz (102.4 kg)  04/12/19 222 lb 6.4 oz (100.9 kg)    BMI Readings from Last 5 Encounters:  08/18/19 29.38 kg/m  08/14/19 29.43 kg/m  06/19/19 30.65 kg/m  04/26/19 31.49 kg/m  04/12/19 31.02 kg/m      Physical Exam Vitals and nursing note reviewed.  Constitutional:      General: He is not in acute distress.    Appearance: Normal appearance. He is obese.  HENT:     Head: Normocephalic and atraumatic.     Right Ear: Hearing and external ear normal.     Left Ear: Hearing and external ear normal.     Nose: No mucosal edema.     Right Turbinates: Not enlarged.     Left Turbinates: Not enlarged.  Cardiovascular:     Rate and Rhythm: Normal rate and regular rhythm.     Pulses: Normal pulses.     Heart sounds: Normal heart sounds. No murmur.  Pulmonary:     Effort: Pulmonary effort is normal.     Breath sounds: Rales (?  Bibasilar) present. No decreased breath sounds or wheezing.     Comments: Multiple inspiratory coughs while performing pulmonary exam Musculoskeletal:     Cervical back: Normal range of motion.     Right lower leg: No edema.     Left lower leg: No edema.  Lymphadenopathy:     Cervical: No cervical adenopathy.  Skin:    General: Skin is warm and dry.     Capillary Refill: Capillary refill takes less than 2 seconds.     Findings: No erythema or rash.  Neurological:     General: No focal deficit present.     Mental Status: He is alert and oriented to person, place, and time.     Motor: No weakness.     Coordination: Coordination normal.     Gait: Gait is intact. Gait normal.  Psychiatric:        Mood and Affect: Mood normal.        Behavior: Behavior normal. Behavior is cooperative.        Thought Content: Thought content normal.        Judgment: Judgment normal.       Assessment & Plan:   Discussion: Reviewed high-resolution CT chest with patient.  Emphasized that cough is likely due to imaging findings on high-resolution CT chest.  Also patient's previous smoking history contributes.  Patient scheduled for pulmonary function testing this was initially delayed due to Covid diagnosis in December/2020.  Patient reports that the cough has been present  prior to Covid.  He does feel that it  potentially is worsened after his Covid infection December/2020.  We will refill the Hycodan cough syrup.  I have emphasized that this is a sedating medication that should be used sparingly.  I have reviewed [place] PMP aware.  I have encouraged the patient take Tessalon Perles every 8 hours scheduled to help with cough suppression.  I have provided an ILD questionnaire for the patient to complete and fill out and bring back to our office.  I have gotten the patient scheduled for an ILD consult with Dr. Vaughan Browner in July/2021 at our next available time slot.  Obstructive sleep apnea Plan: Continue to follow-up with neurology Continue NIV at night  Interstitial pulmonary disease [facility]) Reviewed high-resolution CT chest with patient Patient with dry inspiratory cough Previous notes specifically request for the patient to be established with Dr. Chase Caller, this has not been completed yet Schedules were reviewed Dr. Chase Caller with no open availabilities and no schedule out for July/2021  Plan: Schedule patient today for ILD consult with Dr. Vaughan Browner on 10/03/2019 ILD questionnaire given to patient Patient to complete pulmonary function testing in June/2021 as scheduled  Chronic cough Reviewed high-resolution CT chest with patient today Cough is likely directly related to patient's smoking history as well as ILD seen on CT  Plan: Reviewed [place] PMP aware We will refill Hycodan cough syrup at this time Have also encouraged patient to start taking Tessalon Perles every 8 hours scheduled Patient scheduled for pulmonary function testing in June/2021 We have scheduled patient for an ILD consult with Dr. Vaughan Browner in July/2021  History of COVID-19 Plan: Complete pulmonary function test in June/2021 as scheduled  HIV disease ([place]) Plan: Continue follow-up with infectious disease    Return in about 2 weeks (around 09/01/2019), or if symptoms worsen or fail to improve, for  Follow up with Derl Barrow AGNP-BC, Follow up for FULL PFT - 60 min.   Lauraine Rinne, NP 08/18/2019   This appointment required 45 minutes of patient care (this includes precharting, chart review, review of results, face-to-face care, etc.).

## 2019-08-18 NOTE — Assessment & Plan Note (Signed)
Reviewed high-resolution CT chest with patient today Cough is likely directly related to patient's smoking history as well as ILD seen on CT  Plan: Reviewed [place] PMP aware We will refill Hycodan cough syrup at this time Have also encouraged patient to start taking Tessalon Perles every 8 hours scheduled Patient scheduled for pulmonary function testing in June/2021 We have scheduled patient for an ILD consult with Dr. Vaughan Browner in July/2021

## 2019-08-18 NOTE — Assessment & Plan Note (Signed)
Plan: Continue follow-up with infectious disease 

## 2019-08-18 NOTE — Patient Instructions (Addendum)
You were seen today by Lauraine Rinne, NP  for:   1. Chronic cough  - HYDROcodone-homatropine (HYCODAN) 5-1.5 MG/5ML syrup; Take 5 mLs by mouth every 6 (six) hours as needed for cough.  Dispense: 473 mL; Refill: 0 - benzonatate (TESSALON) 200 MG capsule; Take 1 capsule (200 mg total) by mouth 3 (three) times daily as needed for cough.  Dispense: 90 capsule; Refill: 3  2. Obstructive sleep apnea  Keep follow up with Neurology   We recommend that you continue using your NIV daily >>>Keep up the hard work using your device >>> Goal should be wearing this for the entire night that you are sleeping, at least 4 to 6 hours  Remember:  . Do not drive or operate heavy machinery if tired or drowsy.  . Please notify the supply company and office if you are unable to use your device regularly due to missing supplies or machine being broken.  . Work on maintaining a healthy weight and following your recommended nutrition plan  . Maintain proper daily exercise and movement  . Maintaining proper use of your device can also help improve management of other chronic illnesses such as: Blood pressure, blood sugars, and weight management.   BiPAP/ CPAP Cleaning:  >>>Clean weekly, with Dawn soap, and bottle brush.  Set up to air dry. >>> Wipe mask out daily with wet wipe or towelette    3. Interstitial pulmonary disease (HCC)  We will schedule you for an appointment with an interstitial lung disease specialist in our clinic Dr. Vaughan Browner  We have handed you our interstitial lung disease questionnaire please fill this out and bring with you to your next appointment so that way we can review  Complete pulmonary function testing as scheduled on 09/01/2019   We recommend today:   Meds ordered this encounter  Medications  . HYDROcodone-homatropine (HYCODAN) 5-1.5 MG/5ML syrup    Sig: Take 5 mLs by mouth every 6 (six) hours as needed for cough.    Dispense:  473 mL    Refill:  0  . benzonatate (TESSALON)  200 MG capsule    Sig: Take 1 capsule (200 mg total) by mouth 3 (three) times daily as needed for cough.    Dispense:  90 capsule    Refill:  3    Follow Up:    Return in about 2 weeks (around 09/01/2019), or if symptoms worsen or fail to improve, for Follow up with Derl Barrow AGNP-BC, Follow up for FULL PFT - 60 min.   Please do your part to reduce the spread of COVID-19:      Reduce your risk of any infection  and COVID19 by using the similar precautions used for avoiding the common cold or flu:  Marland Kitchen Wash your hands often with soap and warm water for at least 20 seconds.  If soap and water are not readily available, use an alcohol-based hand sanitizer with at least 60% alcohol.  . If coughing or sneezing, cover your mouth and nose by coughing or sneezing into the elbow areas of your shirt or coat, into a tissue or into your sleeve (not your hands). Langley Gauss A MASK when in public  . Avoid shaking hands with others and consider head nods or verbal greetings only. . Avoid touching your eyes, nose, or mouth with unwashed hands.  . Avoid close contact with people who are sick. . Avoid places or events with large numbers of people in one location, like concerts or sporting  events. . If you have some symptoms but not all symptoms, continue to monitor at home and seek medical attention if your symptoms worsen. . If you are having a medical emergency, call 911.   [place] / e-Visit: [email or web address]         [facility]: [facility]: [id number]                   [facility]: [id number]     It is flu season:   >>> Best ways to protect herself from the flu: Receive the yearly flu vaccine, practice good hand hygiene washing with soap and also using hand sanitizer when available, eat a nutritious meals, get adequate rest, hydrate  appropriately   Please contact the office if your symptoms worsen or you have concerns that you are not improving.   Thank you for choosing [facility] for your healthcare, and for allowing [place] to partner with you on your healthcare journey. I am thankful to be able to provide care to you today.   Wyn Quaker FNP-C

## 2019-08-29 ENCOUNTER — Other Ambulatory Visit (HOSPITAL_COMMUNITY)
Admission: RE | Admit: 2019-08-29 | Discharge: 2019-08-29 | Disposition: A | Discharge status: Home / Self Care | Payer: Medicare Other | Source: Ambulatory Visit | Attending: Pulmonary Disease | Admitting: Pulmonary Disease

## 2019-08-29 DIAGNOSIS — Z01812 Encounter for preprocedural laboratory examination: Secondary | ICD-10-CM | POA: Diagnosis present

## 2019-08-29 DIAGNOSIS — Z20822 Contact with and (suspected) exposure to covid-19: Secondary | ICD-10-CM | POA: Diagnosis not present

## 2019-08-29 LAB — SARS CORONAVIRUS 2 (TAT 6-24 HRS): SARS Coronavirus 2: NEGATIVE

## 2019-09-01 ENCOUNTER — Other Ambulatory Visit: Payer: Self-pay

## 2019-09-01 ENCOUNTER — Ambulatory Visit (INDEPENDENT_AMBULATORY_CARE_PROVIDER_SITE_OTHER): Payer: Medicare Other | Admitting: Primary Care

## 2019-09-01 ENCOUNTER — Encounter: Payer: Self-pay | Admitting: Primary Care

## 2019-09-01 ENCOUNTER — Ambulatory Visit (INDEPENDENT_AMBULATORY_CARE_PROVIDER_SITE_OTHER): Payer: Medicare Other | Admitting: Pulmonary Disease

## 2019-09-01 DIAGNOSIS — R053 Chronic cough: Secondary | ICD-10-CM

## 2019-09-01 DIAGNOSIS — J849 Interstitial pulmonary disease, unspecified: Secondary | ICD-10-CM | POA: Diagnosis not present

## 2019-09-01 DIAGNOSIS — R05 Cough: Secondary | ICD-10-CM | POA: Diagnosis not present

## 2019-09-01 DIAGNOSIS — R0982 Postnasal drip: Secondary | ICD-10-CM | POA: Diagnosis not present

## 2019-09-01 LAB — PULMONARY FUNCTION TEST
DL/VA % pred: 97 %
DL/VA: 3.94 ml/min/mmHg/L
DLCO cor % pred: 81 %
DLCO cor: 22.76 ml/min/mmHg
DLCO unc % pred: 81 %
DLCO unc: 22.76 ml/min/mmHg
FEF 25-75 Post: 3.7 L/sec
FEF 25-75 Pre: 2.94 L/sec
FEF2575-%Change-Post: 26 %
FEF2575-%Pred-Post: 133 %
FEF2575-%Pred-Pre: 106 %
FEV1-%Change-Post: 3 %
FEV1-%Pred-Post: 87 %
FEV1-%Pred-Pre: 84 %
FEV1-Post: 3.14 L
FEV1-Pre: 3.03 L
FEV1FVC-%Change-Post: 3 %
FEV1FVC-%Pred-Pre: 108 %
FEV6-%Change-Post: 0 %
FEV6-%Pred-Post: 81 %
FEV6-%Pred-Pre: 81 %
FEV6-Post: 3.73 L
FEV6-Pre: 3.75 L
FEV6FVC-%Change-Post: 0 %
FEV6FVC-%Pred-Post: 105 %
FEV6FVC-%Pred-Pre: 105 %
FVC-%Change-Post: 0 %
FVC-%Pred-Post: 78 %
FVC-%Pred-Pre: 77 %
FVC-Post: 3.78 L
FVC-Pre: 3.76 L
Post FEV1/FVC ratio: 83 %
Post FEV6/FVC ratio: 100 %
Pre FEV1/FVC ratio: 81 %
Pre FEV6/FVC Ratio: 100 %
RV % pred: 93 %
RV: 2.36 L
TLC % pred: 86 %
TLC: 6.4 L

## 2019-09-01 MED ORDER — HYDROCODONE-HOMATROPINE 5-1.5 MG/5ML PO SYRP: 5.0000 mL | ORAL_SOLUTION | Freq: Four times a day (QID) | ORAL | 0 refills | Status: DC | PRN

## 2019-09-01 MED ORDER — FLOVENT HFA 110 MCG/ACT IN AERO: 2.0000 | INHALATION_SPRAY | Freq: Two times a day (BID) | RESPIRATORY_TRACT | 1 refills | Status: DC

## 2019-09-01 MED ORDER — FAMOTIDINE 20 MG PO TABS: 20.0000 mg | ORAL_TABLET | Freq: Every day | ORAL | 3 refills | Status: DC

## 2019-09-01 MED ORDER — FLUTICASONE PROPIONATE 50 MCG/ACT NA SUSP: 1.0000 | Freq: Every day | NASAL | 2 refills | Status: DC

## 2019-09-01 NOTE — Progress Notes (Signed)
Full PFT performed today. °

## 2019-09-01 NOTE — Progress Notes (Signed)
@Patient  ID: Tyler Hendricks, male    DOB: Mar 12, 1951, 68 y.o.   MRN: [id number]  Chief Complaint  Patient presents with  . Follow-up    pt states still coughing at all times. go over pft results    Referring provider: Hoyt Koch, *  HPI: Patient is a former smoker quit in 1994. Covid in December 2020. Hx severe OSA, chronic cough. Patient of Dr. Loanne Drilling, last seen on 08/18/19 by pulmonary NP.   Previous [facility] pulmonary encounters: 08/18/2019 - Visit  69 year old male former smoker initially referred to our office in January/2021 for chronic cough.  Patient status post COVID-19 infection in December/2020.  Patient also has a significant past medical history for severe obstructive sleep apnea managed on NIV as well as HIV, cirrhosis of the liver.  Patient is followed by Dr. Loanne Drilling.  Patient was last seen in our office in April/2021 by Dr. Loanne Drilling.  This was a virtual visit.  At that time it was recommended for patient to be scheduled for follow-up with Dr. Chase Caller in ILD clinic to evaluate for bronchoscopy.  Patient was encouraged to complete pulmonary function testing as scheduled.  And a refill of Hycodan cough syrup was sent in.  Patient was encouraged to remain with neurology and to continue to utilize noninvasive ventilation nightly.  09/01/2019 Patient presents today for regular visit with PFTs. He has a moderate-severe persistent cough, Hycodan cough medication is the only that has helped so far. He has associated nocturnal wheezing, post nasal drip. HRCT in April showed spectrum of findings consistent with UIP. PFTs today showed mild restrictive lung disease with mid-flow reversibility and normal diffusion capacity. Denies shortness of breath or overt reflux.    Pulmonary function testing:  09/01/2019-FVC 3.70 (78%), FEV1 3.14 (87%), ratio 83, DLCOunc 22.76 (81%)  Allergies  Allergen Reactions  . Lopressor [Metoprolol Tartrate] Rash and Other (See Comments)    Bleeding  (non-specific)   . Integrilin [Eptifibatide] Other (See Comments)    Bleeding (non-specific)    Immunization History  Administered Date(s) Administered  . Fluad Quad(high Dose 65+) 11/17/2018  . Influenza, High Dose Seasonal PF 12/20/2017, 11/17/2018  . PFIZER SARS-COV-2 Vaccination 05/18/2019, 06/14/2019  . Tdap 09/07/2018  . Zoster Recombinat (Shingrix) 01/06/2017    Past Medical History:  Diagnosis Date  . Abnormal result of cardiovascular function study, unspecified   . Atherosclerotic heart disease of native coronary artery without angina pectoris   . CAD (coronary artery disease)   . Coronary angioplasty status   . Diabetes mellitus without complication ([place])   . Essential (primary) hypertension   . Gallstones   . Hepatic cirrhosis ([place])   . Hepatitis C   . HIV infection ([place])   . Hyperlipidemia   . Hypertension   . Internal hemorrhoids   . Kidney stones   . Mixed hyperlipidemia   . Palpitations   . Sleep apnea    CPAP  . Tubular adenoma of colon     Tobacco History: Social History   Tobacco Use  Smoking Status Former Smoker  . Packs/day: 2.00  . Years: 20.00  . Pack years: 40.00  . Types: Cigarettes  . Start date: 85  . Quit date: 11  . Years since quitting: 27.4  Smokeless Tobacco Never Used   Counseling given: Not Answered   Outpatient Medications Prior to Visit  Medication Sig Dispense Refill  . amLODipine (NORVASC) 2.5 MG tablet Take 2.5 mg by mouth daily.    Marland Kitchen atenolol (TENORMIN) 50 MG  tablet Take 50 mg by mouth daily.    . benzonatate (TESSALON) 200 MG capsule Take 1 capsule (200 mg total) by mouth 3 (three) times daily as needed for cough. 90 capsule 3  . bictegravir-emtricitabine-tenofovir AF (BIKTARVY) 50-200-25 MG TABS tablet Take 1 tablet by mouth daily. 30 tablet 5  . clopidogrel (PLAVIX) 75 MG tablet TAKE 1 TABLET(75 MG) BY MOUTH DAILY 90 tablet 3  . losartan-hydrochlorothiazide (HYZAAR) 100-25 MG tablet Take 1 tablet by mouth  daily.   3  . Multiple Vitamin (MULTIVITAMIN) tablet Take 1 tablet by mouth daily.    . rosuvastatin (CRESTOR) 10 MG tablet Take 1 tablet (10 mg total) by mouth daily. 90 tablet 1  . sitaGLIPtin (JANUVIA) 100 MG tablet Take 100 mg by mouth daily.    Marland Kitchen SYNJARDY XR 12.07-998 MG TB24 TK 2 TS PO QD  3  . HYDROcodone-homatropine (HYCODAN) 5-1.5 MG/5ML syrup Take 5 mLs by mouth every 6 (six) hours as needed for cough. 473 mL 0  . methocarbamol (ROBAXIN) 500 MG tablet Take 1 tablet (500 mg total) by mouth 2 (two) times daily as needed for muscle spasms. 60 tablet 2   No facility-administered medications prior to visit.   Review of Systems  Review of Systems  HENT: Positive for postnasal drip.   Respiratory: Positive for cough. Negative for chest tightness and shortness of breath.        Nocturnal wheezing    Physical Exam  BP 118/76 (BP Location: Left Arm, Cuff Size: Normal)   Pulse 86   Temp 98 F (36.7 C) (Oral)   Ht 6\' 2"  (1.88 m)   Wt 218 lb (98.9 kg)   SpO2 94%   BMI 27.99 kg/m  Physical Exam Constitutional:      Appearance: Normal appearance.  HENT:     Right Ear: Tympanic membrane normal. There is no impacted cerumen.     Left Ear: Tympanic membrane normal. There is no impacted cerumen.     Mouth/Throat:     Mouth: Mucous membranes are moist.     Pharynx: Oropharynx is clear. Posterior oropharyngeal erythema present.  Cardiovascular:     Rate and Rhythm: Normal rate and regular rhythm.  Pulmonary:     Breath sounds: Rales present.     Comments: Fine crackles to bases Musculoskeletal:        General: Normal range of motion.  Skin:    General: Skin is warm and dry.  Neurological:     General: No focal deficit present.     Mental Status: He is alert and oriented to person, place, and time. Mental status is at baseline.  Psychiatric:        Mood and Affect: Mood normal.        Behavior: Behavior normal.        Thought Content: Thought content normal.      Lab  Results:  CBC    Component Value Date/Time   WBC 3.7 (L) 03/02/2019 1009   RBC 5.05 03/02/2019 1009   HGB 16.8 03/02/2019 1009   HCT 48.1 03/02/2019 1009   PLT 105.0 (L) 03/02/2019 1009   MCV 95.3 03/02/2019 1009   MCH 32.6 11/09/2018 0948   MCHC 34.8 03/02/2019 1009   RDW 14.1 03/02/2019 1009   LYMPHSABS 1.6 09/07/2018 1048   MONOABS 0.5 09/07/2018 1048   EOSABS 0.2 09/07/2018 1048   BASOSABS 0.1 09/07/2018 1048    BMET    Component Value Date/Time   NA 140 05/23/2019 0942  K 4.6 05/23/2019 0942   CL 104 05/23/2019 0942   CO2 28 05/23/2019 0942   GLUCOSE 171 (H) 05/23/2019 0942   BUN 16 05/23/2019 0942   CREATININE 0.76 05/23/2019 0942   CALCIUM 9.7 05/23/2019 0942   GFRNONAA 98 04/02/2017 1058   GFRAA 114 04/02/2017 1058    BNP No results found for: BNP  ProBNP No results found for: PROBNP  Imaging: No results found.   Assessment & Plan:   Interstitial pulmonary disease ([place]) - Moderate-severe persistent cough and nocturnal wheezing  - PFTs today showed mild restrictive lung disease with mid-flow reversibility and normal diffusion capacity - Plan trial flovent 2 puffs twice daily for cough, add pepcid 20mg  at bedtime - Rx refill for Hycodan cough syrup (pmp reviewed) - Given ILD questionnaire to complete and bring back to next appointment - Ordered for ILD labs; ANA positive- referred to rheumatology   Post-nasal drip - PND likely contributing to cough - Start Flonase nasal spray once daily     Martyn Ehrich, NP 09/07/2019

## 2019-09-01 NOTE — Patient Instructions (Addendum)
Please bring ILD questionnaire in July with you for your apt with Dr. Vaughan Browner   Recommendations: Take Pepcid max 20mg  over the counter at bedtime Start Flonase nasal spray once daily for post nasal drip Trial flovent 2 puffs twice daily for cough Refill Hycodan ok   Orders: ILD labs

## 2019-09-04 ENCOUNTER — Ambulatory Visit: Payer: Medicare Other | Admitting: Interventional Cardiology

## 2019-09-05 NOTE — Progress Notes (Signed)
Please let patient know his labs showed he had a positive ANA, may be related to underlying connective tissue disease.   Please refer to rheumatology to evaluate this as cause of fibrosis

## 2019-09-06 ENCOUNTER — Other Ambulatory Visit: Payer: Self-pay | Admitting: *Deleted

## 2019-09-06 DIAGNOSIS — R768 Other specified abnormal immunological findings in serum: Secondary | ICD-10-CM

## 2019-09-06 LAB — HYPERSENSITIVITY PNEUMONITIS
A. Pullulans Abs: NEGATIVE
A.Fumigatus #1 Abs: NEGATIVE
Micropolyspora faeni, IgG: NEGATIVE
Pigeon Serum Abs: NEGATIVE
Thermoact. Saccharii: NEGATIVE
Thermoactinomyces vulgaris, IgG: NEGATIVE

## 2019-09-06 NOTE — Progress Notes (Signed)
Spoke with patient and his wife and provided results.  Verbalized understanding.

## 2019-09-07 ENCOUNTER — Encounter: Payer: Self-pay | Admitting: Primary Care

## 2019-09-07 DIAGNOSIS — R0982 Postnasal drip: Secondary | ICD-10-CM | POA: Insufficient documentation

## 2019-09-07 NOTE — Assessment & Plan Note (Addendum)
-   Moderate-severe persistent cough and nocturnal wheezing  - PFTs today showed mild restrictive lung disease with mid-flow reversibility and normal diffusion capacity - Plan trial flovent 2 puffs twice daily for cough, add pepcid 20mg  at bedtime - Rx refill for Hycodan cough syrup (pmp reviewed) - Given ILD questionnaire to complete and bring back to next appointment - Ordered for ILD labs; ANA positive- referred to rheumatology

## 2019-09-07 NOTE — Assessment & Plan Note (Signed)
-   PND likely contributing to cough - Start Flonase nasal spray once daily

## 2019-09-13 LAB — MYOSITIS ASSESSR PLUS JO-1 AUTOABS
EJ Autoabs: NOT DETECTED
Jo-1 Autoabs: <1 AI
Ku Autoabs: NOT DETECTED
Mi-2 Autoabs: NOT DETECTED
OJ Autoabs: NOT DETECTED
PL-12 Autoabs: NOT DETECTED
PL-7 Autoabs: NOT DETECTED
SRP Autoabs: NOT DETECTED

## 2019-09-13 LAB — ANA,IFA RA DIAG PNL W/RFLX TIT/PATN
Anti Nuclear Antibody (ANA): POSITIVE — AB
Cyclic Citrullin Peptide Ab: <16 UNITS
Rheumatoid fact SerPl-aCnc: <14 IU/mL (ref <14)

## 2019-09-13 LAB — SJOGREN'S SYNDROME ANTIBODS(SSA + SSB)
SSA (Ro) (ENA) Antibody, IgG: <1 AI
SSB (La) (ENA) Antibody, IgG: <1 AI

## 2019-09-13 LAB — ANTI-NUCLEAR AB-TITER (ANA TITER): ANA Titer 1: 1:40 {titer} — ABNORMAL HIGH

## 2019-09-13 LAB — ANTI-SCLERODERMA ANTIBODY: Scleroderma (Scl-70) (ENA) Antibody, IgG: <1 AI

## 2019-09-13 LAB — ANCA SCREEN W REFLEX TITER: ANCA Screen: NEGATIVE

## 2019-09-18 MED FILL — BIKTARVY 50-200-25 MG TABS: 50-200-25 | 30 days supply | Qty: 30 | Fill #4

## 2019-09-22 ENCOUNTER — Telehealth: Payer: Self-pay | Admitting: Primary Care

## 2019-09-22 DIAGNOSIS — R053 Chronic cough: Secondary | ICD-10-CM

## 2019-09-22 MED ORDER — HYDROCODONE-HOMATROPINE 5-1.5 MG/5ML PO SYRP: 5.0000 mL | ORAL_SOLUTION | Freq: Four times a day (QID) | ORAL | 0 refills | Status: DC | PRN

## 2019-09-22 NOTE — Telephone Encounter (Signed)
Patient requesting a refill of his Hycodan.  Please advise.  Thank you.

## 2019-09-22 NOTE — Telephone Encounter (Signed)
Called and spoke with pt letting him know that Dr. Vaughan Browner sent Rx for hycodan to pharmacy for him. Pt verbalized understanding. Nothing further needed.

## 2019-09-22 NOTE — Telephone Encounter (Signed)
Refilled 1 time until he can be seen in the office again.

## 2019-10-02 ENCOUNTER — Telehealth: Payer: Self-pay | Admitting: Internal Medicine

## 2019-10-02 NOTE — Telephone Encounter (Signed)
New message:    1.Medication Requested: SYNJARDY XR 12.07-998 MG TB24 2. Pharmacy (Name, [place]): [facility] [place], [place] [facility] [address] [place] 3. On Med List: Yes  4. Last Visit with PCP: 03/02/19  5. Next visit date with PCP:   Agent: Please be advised that RX refills may take up to 3 business days. We ask that you follow-up with your pharmacy.

## 2019-10-03 ENCOUNTER — Other Ambulatory Visit: Payer: Self-pay

## 2019-10-03 ENCOUNTER — Encounter: Payer: Self-pay | Admitting: Pulmonary Disease

## 2019-10-03 ENCOUNTER — Ambulatory Visit (INDEPENDENT_AMBULATORY_CARE_PROVIDER_SITE_OTHER): Payer: Medicare Other | Admitting: Pulmonary Disease

## 2019-10-03 VITALS — BP 116/60 | HR 62 | Temp 98.1°F | Ht 72.0 in | Wt 219.4 lb

## 2019-10-03 DIAGNOSIS — J849 Interstitial pulmonary disease, unspecified: Secondary | ICD-10-CM

## 2019-10-03 DIAGNOSIS — G4733 Obstructive sleep apnea (adult) (pediatric): Secondary | ICD-10-CM | POA: Diagnosis not present

## 2019-10-03 DIAGNOSIS — I25118 Atherosclerotic heart disease of native coronary artery with other forms of angina pectoris: Secondary | ICD-10-CM

## 2019-10-03 NOTE — Progress Notes (Signed)
Tyler Hendricks    [id number]    1950/04/18  Primary Care Physician:Crawford, Real Cons, MD  Referring Physician: Hoyt Koch, MD [address] [place],  [place] [address]  Chief complaint: Consult for pulmonary fibrosis  HPI: 69 year old with history of coronary artery disease, HIV, hepatitis C, cirrhosis Complains of chronic cough for the past several years. He has paroxysms of cough associated with dyspnea. Mild chest congestion History notable for COVID-19 infection in December 2021. He had very mild symptoms and did not require hospitalization.  CT scan with probable UIP pattern pulmonary fibrosis. He has been referred to the ILD clinic for further evaluation  Pets: Has a dog. No cats, birds, farm animals Occupation: Retired Development worker, community Exposures: No known exposures. No mold, hot tub, Jacuzzi. No down pillows or comforters ILD questionnaire 10/03/2019-negative Smoking history: 60-pack-year smoker. Quit in late 1990s Travel history: Originally from [place]. Moved to [place] in 2018 Relevant family history: Mother had emphysema. She was a heavy smoker.  Outpatient Encounter Medications as of 10/03/2019  Medication Sig   amLODipine (NORVASC) 2.5 MG tablet Take 2.5 mg by mouth daily.   atenolol (TENORMIN) 50 MG tablet Take 50 mg by mouth daily.   benzonatate (TESSALON) 200 MG capsule Take 1 capsule (200 mg total) by mouth 3 (three) times daily as needed for cough.   bictegravir-emtricitabine-tenofovir AF (BIKTARVY) 50-200-25 MG TABS tablet Take 1 tablet by mouth daily.   clopidogrel (PLAVIX) 75 MG tablet TAKE 1 TABLET(75 MG) BY MOUTH DAILY   famotidine (PEPCID) 20 MG tablet Take 1 tablet (20 mg total) by mouth at bedtime.   fluticasone (FLOVENT HFA) 110 MCG/ACT inhaler Inhale 2 puffs into the lungs 2 (two) times daily.   HYDROcodone-homatropine (HYCODAN) 5-1.5 MG/5ML syrup Take 5 mLs by mouth every 6 (six) hours as  needed for cough.   losartan-hydrochlorothiazide (HYZAAR) 100-25 MG tablet Take 1 tablet by mouth daily.    Multiple Vitamin (MULTIVITAMIN) tablet Take 1 tablet by mouth daily.   rosuvastatin (CRESTOR) 10 MG tablet Take 1 tablet (10 mg total) by mouth daily.   sitaGLIPtin (JANUVIA) 100 MG tablet Take 100 mg by mouth daily.   SYNJARDY XR 12.07-998 MG TB24 TK 2 TS PO QD   fluticasone (FLONASE) 50 MCG/ACT nasal spray Place 1 spray into both nostrils daily. (Patient not taking: Reported on 10/03/2019)   No facility-administered encounter medications on file as of 10/03/2019.    Allergies as of 10/03/2019 - Review Complete 10/03/2019  Allergen Reaction Noted   Lopressor [metoprolol tartrate] Rash and Other (See Comments) 04/15/2017   Integrilin [eptifibatide] Other (See Comments) 06/14/2017    Past Medical History:  Diagnosis Date   Abnormal result of cardiovascular function study, unspecified    Atherosclerotic heart disease of native coronary artery without angina pectoris    CAD (coronary artery disease)    Coronary angioplasty status    Diabetes mellitus without complication ([facility])    Essential (primary) hypertension    Gallstones    Hepatic cirrhosis (H[facility])    Hepatitis C    HIV infection ([facility])    Hyperlipidemia    Hypertension    Internal hemorrhoids    Kidney stones    Mixed hyperlipidemia    Palpitations    Sleep apnea    CPAP   Tubular adenoma of colon     Past Surgical History:  Procedure Laterality Date   CARDIAC CATHETERIZATION Left 04/2013   chlecystectomy  COLONOSCOPY  05/02/2013   PERCUTANEOUS CORONARY STENT INTERVENTION (PCI-S)     POLYPECTOMY     UMBILICAL HERNIA REPAIR      Family History  Problem Relation Age of Onset   Emphysema Mother        pulmonary   Heart attack Father 79   Lung cancer Sister    Esophageal cancer Brother    Esophageal cancer Brother    Colon cancer Neg Hx    Inflammatory bowel disease  Neg Hx    Liver disease Neg Hx    Pancreatic cancer Neg Hx    Rectal cancer Neg Hx    Stomach cancer Neg Hx    Colon polyps Neg Hx     Social History   Socioeconomic History   Marital status: Married    Spouse name: Not on file   Number of children: 1   Years of education: Not on file   Highest education level: 10th grade  Occupational History   Occupation: retired  Tobacco Use   Smoking status: Former Smoker    Packs/day: 2.00    Years: 20.00    Pack years: 40.00    Types: Cigarettes    Start date: 1974    Quit date: 1994    Years since quitting: 27.5   Smokeless tobacco: Never Used  Scientific laboratory technician Use: Never used  Substance and Sexual Activity   Alcohol use: Yes    Comment: social-occ beer   Drug use: No   Sexual activity: Yes  Other Topics Concern   Not on file  Social History Narrative   Not on file   Social Determinants of Health   Financial Resource Strain:    Difficulty of Paying Living Expenses:   Food Insecurity:    Worried About Charity fundraiser in the Last Year:    Arboriculturist in the Last Year:   Transportation Needs:    Film/video editor (Medical):    Lack of Transportation (Non-Medical):   Physical Activity:    Days of Exercise per Week:    Minutes of Exercise per Session:   Stress:    Feeling of Stress :   Social Connections:    Frequency of Communication with Friends and Family:    Frequency of Social Gatherings with Friends and Family:    Attends Religious Services:    Active Member of Clubs or Organizations:    Attends Music therapist:    Marital Status:   Intimate Partner Violence:    Fear of Current or Ex-Partner:    Emotionally Abused:    Physically Abused:    Sexually Abused:     Review of systems: Review of Systems  Constitutional: Negative for fever and chills.  HENT: Negative.   Eyes: Negative for blurred vision.  Respiratory: as per HPI    Cardiovascular: Negative for chest pain and palpitations.  Gastrointestinal: Negative for vomiting, diarrhea, blood per rectum. Genitourinary: Negative for dysuria, urgency, frequency and hematuria.  Musculoskeletal: Negative for myalgias, back pain and joint pain.  Skin: Negative for itching and rash.  Neurological: Negative for dizziness, tremors, focal weakness, seizures and loss of consciousness.  Endo/Heme/Allergies: Negative for environmental allergies.  Psychiatric/Behavioral: Negative for depression, suicidal ideas and hallucinations.  All other systems reviewed and are negative.  Physical Exam: Blood pressure 116/60, pulse 62, temperature 98.1 F (36.7 C), temperature source Oral, height 6' (1.829 m), weight 219 lb 6.4 oz (99.5 kg), SpO2 94 %. Gen:  No acute distress HEENT:  EOMI, sclera anicteric Neck:     No masses; no thyromegaly Lungs:    Clear to auscultation bilaterally; normal respiratory effort CV:         Regular rate and rhythm; no murmurs Abd:      + bowel sounds; soft, non-tender; no palpable masses, no distension Ext:    No edema; adequate peripheral perfusion Skin:      Warm and dry; no rash Neuro: alert and oriented x 3 Psych: normal mood and affect  Data Reviewed: Imaging: High-res CT 07/13/2019-groundglass attenuation with septal thickening, cylindrical bronchiectasis with mild basal gradient Probable UIP pattern  PFTs: 09/01/2019 FVC 3.78 [78%], FEV1 3.14 [87%], F/F 83, TLC 6.40 [86%], DLCO 22.76 [81%] Normal test  Labs: CTD serologies 08/30/2019-positive for ANA 1:40, nuclear, speckled  Assessment:  Pulmonary fibrosis Has probable UIP pattern on imaging There is no symptoms of connective tissue disease. Borderline positive ANA is likely nonspecific He did have Covid in December 2020 but it was a very mild case with no hospitalization, besides his symptoms preceded the Covid infection  He likely has IPF based on CT appearance in male  smoker. Discussed further work-up such as bronchoscopy, lung biopsy but he wants to avoid invasive procedures Reviewed treatment with antifibrotics. We will have to be cautious with these given history of cirrhosis He will like to discuss this further with his wife and do some research before making a decision Follow-up in 1 to 2 months.  Severe OSA Compliant with CPAP. Reviewed download on return visit.  Plan/Recommendations: Ongoing discussions about treatment for pulmonary fibrosis Follow-up in 1 to 2 months.  Marshell Garfinkel MD [facility] 10/03/2019, 1:44 PM  CC: Hoyt Koch, *

## 2019-10-03 NOTE — Patient Instructions (Addendum)
I have reviewed your scans which show you have a condition called interstitial lung disease and pulmonary fibrosis. This can be a progressive condition. To make a diagnosis with 270% certainty you will need a lung biopsy However we can also treat with reasonable confidence on the diagnosis even without lung biopsy  The options for treatment are medications that will slow the progression of scar formation in the lung These medications are called Ofev or Esbriet. Please discuss with your family regarding this and call with any questions I will make a follow-up appointment in 1 to 2 months to check in with you again.

## 2019-10-04 ENCOUNTER — Other Ambulatory Visit: Payer: Self-pay | Admitting: Primary Care

## 2019-10-04 ENCOUNTER — Encounter: Payer: Self-pay | Admitting: Internal Medicine

## 2019-10-04 MED ORDER — SYNJARDY XR 12.5-1000 MG PO TB24: ORAL_TABLET | ORAL | 2 refills | Status: DC

## 2019-10-04 NOTE — Telephone Encounter (Signed)
    Scheduler advised patient to contact prescribing provider of [place]

## 2019-10-04 NOTE — Telephone Encounter (Signed)
I have called patient for clarification of MyChart message. He states that he needed a refill of the [place]. The pharmacy was under the impression that he needed his [place] which is where the confusion came from with [name] needing to refil from infectious disease. Pt stated that he is only getting HIV med from infection disease and doesn't need PCP to fill that.

## 2019-10-05 DIAGNOSIS — H0102A Squamous blepharitis right eye, upper and lower eyelids: Secondary | ICD-10-CM | POA: Diagnosis not present

## 2019-10-05 DIAGNOSIS — H35033 Hypertensive retinopathy, bilateral: Secondary | ICD-10-CM | POA: Diagnosis not present

## 2019-10-05 DIAGNOSIS — E119 Type 2 diabetes mellitus without complications: Secondary | ICD-10-CM | POA: Diagnosis not present

## 2019-10-05 DIAGNOSIS — H04123 Dry eye syndrome of bilateral lacrimal glands: Secondary | ICD-10-CM | POA: Diagnosis not present

## 2019-10-05 DIAGNOSIS — H2513 Age-related nuclear cataract, bilateral: Secondary | ICD-10-CM | POA: Diagnosis not present

## 2019-10-05 DIAGNOSIS — H0102B Squamous blepharitis left eye, upper and lower eyelids: Secondary | ICD-10-CM | POA: Diagnosis not present

## 2019-10-16 ENCOUNTER — Telehealth: Payer: Self-pay | Admitting: Internal Medicine

## 2019-10-16 MED ORDER — SYNJARDY XR 12.5-1000 MG PO TB24: ORAL_TABLET | ORAL | 1 refills | Status: DC

## 2019-10-16 NOTE — Telephone Encounter (Signed)
New message:   1.Medication Requested: SYNJARDY XR 12.07-998 MG TB24 2. Pharmacy (Name, [place]): [facility] [place], [place] - [address] 3. On Med List: yes  4. Last Visit with PCP: 03/02/19  5. Next visit date with PCP: None  Pt states he would like a 90 day supply because the 30 days doesn't last a long time due to him taking 2 a day. Pt also states they will be going out of town on Wednesday and need this prescription filled before then. Please advise.   Agent: Please be advised that RX refills may take up to 3 business days. We ask that you follow-up with your pharmacy.

## 2019-10-17 ENCOUNTER — Other Ambulatory Visit: Payer: Self-pay | Admitting: Pulmonary Disease

## 2019-10-17 DIAGNOSIS — R053 Chronic cough: Secondary | ICD-10-CM

## 2019-10-17 MED FILL — BIKTARVY 50-200-25 MG TABS: 50-200-25 | 30 days supply | Qty: 30 | Fill #5

## 2019-10-17 NOTE — Telephone Encounter (Signed)
Dr. Vaughan Browner if you are ok refilling this medication for patient please sign pended order.  Thank you

## 2019-10-18 ENCOUNTER — Telehealth: Payer: Self-pay | Admitting: Pulmonary Disease

## 2019-10-18 DIAGNOSIS — R053 Chronic cough: Secondary | ICD-10-CM

## 2019-10-18 MED ORDER — HYDROCODONE-HOMATROPINE 5-1.5 MG/5ML PO SYRP: 5.0000 mL | ORAL_SOLUTION | Freq: Four times a day (QID) | ORAL | 0 refills | Status: DC | PRN

## 2019-10-18 NOTE — Telephone Encounter (Signed)
Called and spoke with pt. Pt is requesting a refill of his hycodan cough syrup. Pt needs med to be sent to [facility] off of [address].  Pt stated he will be leaving tomorrow, 7/29 to go out of town and is hoping to be able to get med refilled prior to leaving.  Dr. Vaughan Browner, please advise.

## 2019-10-18 NOTE — Telephone Encounter (Signed)
I called it in 

## 2019-10-18 NOTE — Telephone Encounter (Signed)
Spoke with pt and advised that Dr Vaughan Browner sent in refill for Hycodan syrup to pharmacy. Pt verbalized understanding. Nothing further needed.

## 2019-10-27 ENCOUNTER — Other Ambulatory Visit: Payer: Self-pay | Admitting: Primary Care

## 2019-10-27 DIAGNOSIS — R053 Chronic cough: Secondary | ICD-10-CM

## 2019-11-09 ENCOUNTER — Telehealth: Payer: Self-pay | Admitting: Pulmonary Disease

## 2019-11-09 NOTE — Telephone Encounter (Signed)
Try over-the-counter Motrin and topical medications such as Salonpas.  Avoid Tylenol due to liver issues
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
## 2019-11-09 NOTE — Telephone Encounter (Signed)
Called spoke with patient  Voiced understanding. Nothing further is needed at this time

## 2019-11-13 ENCOUNTER — Telehealth: Payer: Self-pay | Admitting: Internal Medicine

## 2019-11-13 NOTE — Telephone Encounter (Signed)
    1.Medication Requested: Omega 3  2. Pharmacy (Name, [place], [place], [place] - [place] [address] AT [address] [place]  3. On Med List: no  4. Last Visit with PCP:  02/2019  5. Next visit date with PCP: 11/15/19 Ronnald Ramp   Agent: Please be advised that RX refills may take up to 3 business days. We ask that you follow-up with your pharmacy.

## 2019-11-14 ENCOUNTER — Other Ambulatory Visit: Payer: Self-pay | Admitting: Pulmonary Disease

## 2019-11-14 DIAGNOSIS — R053 Chronic cough: Secondary | ICD-10-CM

## 2019-11-14 MED ORDER — HYDROCODONE-HOMATROPINE 5-1.5 MG/5ML PO SYRP: 5.0000 mL | ORAL_SOLUTION | Freq: Four times a day (QID) | ORAL | 0 refills | Status: DC | PRN

## 2019-11-14 NOTE — Telephone Encounter (Signed)
Called and spoke with patient to let him know that we would send message to Dr. Vaughan Browner to get cough syrup refilled. Patient expressed understanding.   Dr.Mannam if you are ok with refilling medication please sign order that is pended below.

## 2019-11-15 ENCOUNTER — Ambulatory Visit (INDEPENDENT_AMBULATORY_CARE_PROVIDER_SITE_OTHER): Payer: Medicare Other | Admitting: Internal Medicine

## 2019-11-15 ENCOUNTER — Encounter: Payer: Self-pay | Admitting: Internal Medicine

## 2019-11-15 ENCOUNTER — Other Ambulatory Visit: Payer: Self-pay

## 2019-11-15 VITALS — BP 112/64 | HR 63 | Temp 98.4°F | Resp 16 | Ht 72.0 in | Wt 214.5 lb

## 2019-11-15 DIAGNOSIS — I25118 Atherosclerotic heart disease of native coronary artery with other forms of angina pectoris: Secondary | ICD-10-CM | POA: Diagnosis not present

## 2019-11-15 DIAGNOSIS — M545 Low back pain, unspecified: Secondary | ICD-10-CM

## 2019-11-15 DIAGNOSIS — E1165 Type 2 diabetes mellitus with hyperglycemia: Secondary | ICD-10-CM

## 2019-11-15 DIAGNOSIS — M170 Bilateral primary osteoarthritis of knee: Secondary | ICD-10-CM | POA: Diagnosis not present

## 2019-11-15 DIAGNOSIS — D696 Thrombocytopenia, unspecified: Secondary | ICD-10-CM

## 2019-11-15 DIAGNOSIS — I2510 Atherosclerotic heart disease of native coronary artery without angina pectoris: Secondary | ICD-10-CM | POA: Diagnosis not present

## 2019-11-15 DIAGNOSIS — E1169 Type 2 diabetes mellitus with other specified complication: Secondary | ICD-10-CM | POA: Insufficient documentation

## 2019-11-15 DIAGNOSIS — E781 Pure hyperglyceridemia: Secondary | ICD-10-CM

## 2019-11-15 DIAGNOSIS — E782 Mixed hyperlipidemia: Secondary | ICD-10-CM | POA: Insufficient documentation

## 2019-11-15 DIAGNOSIS — E785 Hyperlipidemia, unspecified: Secondary | ICD-10-CM

## 2019-11-15 DIAGNOSIS — N400 Enlarged prostate without lower urinary tract symptoms: Secondary | ICD-10-CM | POA: Diagnosis not present

## 2019-11-15 DIAGNOSIS — R252 Cramp and spasm: Secondary | ICD-10-CM

## 2019-11-15 DIAGNOSIS — Z125 Encounter for screening for malignant neoplasm of prostate: Secondary | ICD-10-CM

## 2019-11-15 DIAGNOSIS — Z0000 Encounter for general adult medical examination without abnormal findings: Secondary | ICD-10-CM | POA: Diagnosis not present

## 2019-11-15 DIAGNOSIS — B20 Human immunodeficiency virus [HIV] disease: Secondary | ICD-10-CM

## 2019-11-15 DIAGNOSIS — G8929 Other chronic pain: Secondary | ICD-10-CM | POA: Diagnosis not present

## 2019-11-15 DIAGNOSIS — R0789 Other chest pain: Secondary | ICD-10-CM | POA: Diagnosis not present

## 2019-11-15 DIAGNOSIS — E559 Vitamin D deficiency, unspecified: Secondary | ICD-10-CM

## 2019-11-15 LAB — POCT GLYCOSYLATED HEMOGLOBIN (HGB A1C): Hemoglobin A1C: 6.2 % — AB (ref 4.0–5.6)

## 2019-11-15 LAB — POCT GLUCOSE (DEVICE FOR HOME USE): Glucose Fasting, POC: 133 mg/dL — AB (ref 70–99)

## 2019-11-15 MED ORDER — TRAMADOL HCL 50 MG PO TABS: 50.0000 mg | ORAL_TABLET | Freq: Four times a day (QID) | ORAL | 1 refills | Status: DC | PRN

## 2019-11-15 MED ORDER — VASCEPA 1 G PO CAPS: 2.0000 g | ORAL_CAPSULE | Freq: Two times a day (BID) | ORAL | 1 refills | Status: DC

## 2019-11-15 MED FILL — BIKTARVY 50-200-25 MG TABS: 50-200-25 | 30 days supply | Qty: 30 | Fill #0

## 2019-11-15 NOTE — Patient Instructions (Signed)
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
## 2019-11-16 DIAGNOSIS — R0789 Other chest pain: Secondary | ICD-10-CM | POA: Insufficient documentation

## 2019-11-16 DIAGNOSIS — Z0000 Encounter for general adult medical examination without abnormal findings: Secondary | ICD-10-CM | POA: Insufficient documentation

## 2019-11-16 DIAGNOSIS — G8929 Other chronic pain: Secondary | ICD-10-CM | POA: Insufficient documentation

## 2019-11-16 LAB — BASIC METABOLIC PANEL WITH GFR
BUN: 19 mg/dL (ref 7–25)
CO2: 27 mmol/L (ref 20–32)
Calcium: 9.8 mg/dL (ref 8.6–10.3)
Chloride: 105 mmol/L (ref 98–110)
Creat: 0.89 mg/dL (ref 0.70–1.25)
GFR, Est African American: 101 mL/min/{1.73_m2} (ref >=60)
GFR, Est Non African American: 87 mL/min/{1.73_m2} (ref >=60)
Glucose, Bld: 105 mg/dL — ABNORMAL HIGH (ref 65–99)
Potassium: 4.3 mmol/L (ref 3.5–5.3)
Sodium: 141 mmol/L (ref 135–146)

## 2019-11-16 LAB — LIPID PANEL
Cholesterol: 145 mg/dL (ref <200)
HDL: 78 mg/dL (ref >=40)
LDL Cholesterol (Calc): 53 mg/dL (calc)
Non-HDL Cholesterol (Calc): 67 mg/dL (calc) (ref <130)
Total CHOL/HDL Ratio: 1.9 (calc) (ref <5.0)
Triglycerides: 49 mg/dL (ref <150)

## 2019-11-16 LAB — CBC WITH DIFFERENTIAL/PLATELET
Absolute Monocytes: 466 cells/uL (ref 200–950)
Basophils Absolute: 58 cells/uL (ref 0–200)
Basophils Relative: 1.1 %
Eosinophils Absolute: 270 cells/uL (ref 15–500)
Eosinophils Relative: 5.1 %
HCT: 47.4 % (ref 38.5–50.0)
Hemoglobin: 16 g/dL (ref 13.2–17.1)
Lymphs Abs: 1972 cells/uL (ref 850–3900)
MCH: 30 pg (ref 27.0–33.0)
MCHC: 33.8 g/dL (ref 32.0–36.0)
MCV: 88.8 fL (ref 80.0–100.0)
MPV: 10.1 fL (ref 7.5–12.5)
Monocytes Relative: 8.8 %
Neutro Abs: 2533 cells/uL (ref 1500–7800)
Neutrophils Relative %: 47.8 %
Platelets: 160 10*3/uL (ref 140–400)
RBC: 5.34 10*6/uL (ref 4.20–5.80)
RDW: 13.5 % (ref 11.0–15.0)
Total Lymphocyte: 37.2 %
WBC: 5.3 10*3/uL (ref 3.8–10.8)

## 2019-11-16 LAB — MICROALBUMIN / CREATININE URINE RATIO
Creatinine, Urine: 107 mg/dL (ref 20–320)
Microalb Creat Ratio: 7 mcg/mg creat (ref <30)
Microalb, Ur: 0.8 mg/dL

## 2019-11-16 LAB — PSA: PSA: 0.1 ng/mL (ref <=4.0)

## 2019-11-16 LAB — VITAMIN B12: Vitamin B-12: 547 pg/mL (ref 200–1100)

## 2019-11-16 LAB — FOLATE: Folate: 18.2 ng/mL

## 2019-11-16 LAB — TSH: TSH: 2.29 mIU/L (ref 0.40–4.50)

## 2019-11-16 NOTE — Assessment & Plan Note (Signed)
Exam completed. Labs reviewed. Cancer screenings are up-to-date. He refused vaccines against influenza pneumonia. Patient education was given.

## 2019-11-20 LAB — COMPREHENSIVE METABOLIC PANEL
AG Ratio: 1.5 (calc) (ref 1.0–2.5)
ALT: 18 U/L (ref 9–46)
AST: 32 U/L (ref 10–35)
Albumin: 4 g/dL (ref 3.6–5.1)
Alkaline phosphatase (APISO): 148 U/L — ABNORMAL HIGH (ref 35–144)
BUN: 19 mg/dL (ref 7–25)
CO2: 28 mmol/L (ref 20–32)
Calcium: 9.8 mg/dL (ref 8.6–10.3)
Chloride: 104 mmol/L (ref 98–110)
Creat: 0.84 mg/dL (ref 0.70–1.25)
Globulin: 2.7 g/dL (calc) (ref 1.9–3.7)
Glucose, Bld: 103 mg/dL — ABNORMAL HIGH (ref 65–99)
Potassium: 4.2 mmol/L (ref 3.5–5.3)
Sodium: 140 mmol/L (ref 135–146)
Total Bilirubin: 3 mg/dL — ABNORMAL HIGH (ref 0.2–1.2)
Total Protein: 6.7 g/dL (ref 6.1–8.1)

## 2019-11-20 LAB — HIV-1 RNA QUANT-NO REFLEX-BLD

## 2019-11-23 ENCOUNTER — Other Ambulatory Visit: Payer: Medicare Other

## 2019-11-24 ENCOUNTER — Other Ambulatory Visit: Payer: Medicare Other

## 2019-11-24 ENCOUNTER — Other Ambulatory Visit: Payer: Self-pay | Admitting: Internal Medicine

## 2019-11-24 ENCOUNTER — Other Ambulatory Visit: Payer: Self-pay

## 2019-11-24 DIAGNOSIS — B20 Human immunodeficiency virus [HIV] disease: Secondary | ICD-10-CM

## 2019-11-24 LAB — T-HELPER CELL (CD4) - (RCID CLINIC ONLY)
CD4 % Helper T Cell: 55 % (ref 33–65)
CD4 T Cell Abs: 789 /uL (ref 400–1790)

## 2019-12-04 ENCOUNTER — Ambulatory Visit: Payer: Medicare Other | Admitting: Pulmonary Disease

## 2019-12-07 ENCOUNTER — Ambulatory Visit: Payer: Medicare Other | Admitting: Family

## 2019-12-08 ENCOUNTER — Other Ambulatory Visit: Payer: Self-pay

## 2019-12-08 ENCOUNTER — Encounter: Payer: Self-pay | Admitting: Family

## 2019-12-08 ENCOUNTER — Other Ambulatory Visit: Payer: Self-pay | Admitting: Family

## 2019-12-08 ENCOUNTER — Ambulatory Visit (INDEPENDENT_AMBULATORY_CARE_PROVIDER_SITE_OTHER): Payer: Medicare Other | Admitting: Family

## 2019-12-08 VITALS — BP 131/83 | HR 74 | Temp 98.2°F | Resp 16 | Ht 72.0 in | Wt 214.0 lb

## 2019-12-08 DIAGNOSIS — Z0000 Encounter for general adult medical examination without abnormal findings: Secondary | ICD-10-CM

## 2019-12-08 DIAGNOSIS — B20 Human immunodeficiency virus [HIV] disease: Secondary | ICD-10-CM

## 2019-12-08 DIAGNOSIS — I25118 Atherosclerotic heart disease of native coronary artery with other forms of angina pectoris: Secondary | ICD-10-CM

## 2019-12-08 DIAGNOSIS — Z23 Encounter for immunization: Secondary | ICD-10-CM | POA: Diagnosis not present

## 2019-12-08 MED ORDER — BIKTARVY 50-200-25 MG PO TABS: 1.0000 | ORAL_TABLET | Freq: Every day | ORAL | 11 refills | Status: DC

## 2019-12-08 NOTE — Progress Notes (Signed)
Subjective:    Patient ID: Tyler Hendricks, male    DOB: 1950/07/07, 69 y.o.   MRN: [id number]  No chief complaint on file.    HPI:  Tyler Hendricks is a 69 y.o. male with HIV disease who was last seen in the office on 06/19/2019 with good adherence and tolerance to his ART regimen of Biktarvy.  Viral load at the time was undetectable with CD4 count of 726.  Most recent blood work completed on 11/24/2019 with viral load that remains undetectable and CD4 count of 789.  Here today for routine follow-up.  Tyler Hendricks continues to take his Biktarvy daily as prescribed with no adverse side effects or missed doses since his last office visit.  Overall feeling well today with no new concerns/complaints. Denies fevers, chills, night sweats, headaches, changes in vision, neck pain/stiffness, nausea, diarrhea, vomiting, lesions or rashes.  Tyler Hendricks remains covered through Medicare and has no problems obtaining his medication from the pharmacy.  Denies feelings of being down, depressed, or hopeless recently.  No recreational illicit drug use, tobacco use, with alcohol use socially and the occasional beer.  Covid vaccination up-to-date per recommendations.  Needs follow-up for routine dental care.    Allergies  Allergen Reactions   Lopressor [Metoprolol Tartrate] Rash and Other (See Comments)    Bleeding (non-specific)    Integrilin [Eptifibatide] Other (See Comments)    Bleeding (non-specific)      Outpatient Medications Prior to Visit  Medication Sig Dispense Refill   amLODipine (NORVASC) 2.5 MG tablet Take 2.5 mg by mouth daily.     atenolol (TENORMIN) 50 MG tablet Take 50 mg by mouth daily.     clopidogrel (PLAVIX) 75 MG tablet TAKE 1 TABLET(75 MG) BY MOUTH DAILY 90 tablet 3   famotidine (PEPCID) 20 MG tablet Take 1 tablet (20 mg total) by mouth at bedtime. 30 tablet 3   FLOVENT HFA 110 MCG/ACT inhaler INHALE 2 PUFFS INTO THE LUNGS TWICE DAILY 12 g 3   losartan-hydrochlorothiazide (HYZAAR)  100-25 MG tablet Take 1 tablet by mouth daily.   3   Multiple Vitamin (MULTIVITAMIN) tablet Take 1 tablet by mouth daily.     rosuvastatin (CRESTOR) 10 MG tablet TAKE 1 TABLET(10 MG) BY MOUTH DAILY 90 tablet 1   sitaGLIPtin (JANUVIA) 100 MG tablet Take 100 mg by mouth daily.     SYNJARDY XR 12.07-998 MG TB24 TK 2 TS PO QD 180 tablet 1   traMADol (ULTRAM) 50 MG tablet Take 1 tablet (50 mg total) by mouth every 6 (six) hours as needed. 65 tablet 1   VASCEPA 1 g capsule Take 2 capsules (2 g total) by mouth 2 (two) times daily. 360 capsule 1   bictegravir-emtricitabine-tenofovir AF (BIKTARVY) 50-200-25 MG TABS tablet Take 1 tablet by mouth daily. 30 tablet 5   HYDROcodone-homatropine (HYCODAN) 5-1.5 MG/5ML syrup Take 5 mLs by mouth every 6 (six) hours as needed for cough. 473 mL 0   benzonatate (TESSALON) 200 MG capsule Take 1 capsule (200 mg total) by mouth 3 (three) times daily as needed for cough. 90 capsule 3   No facility-administered medications prior to visit.     Past Medical History:  Diagnosis Date   Abnormal result of cardiovascular function study, unspecified    Atherosclerotic heart disease of native coronary artery without angina pectoris    CAD (coronary artery disease)    Coronary angioplasty status    Diabetes mellitus without complication ([place])    Essential (primary) hypertension  Gallstones    Hepatic cirrhosis (HCC)    Hepatitis C    HIV infection ([place])    Hyperlipidemia    Hypertension    Internal hemorrhoids    Kidney stones    Mixed hyperlipidemia    Palpitations    Sleep apnea    CPAP   Tubular adenoma of colon      Past Surgical History:  Procedure Laterality Date   CARDIAC CATHETERIZATION Left 04/2013   chlecystectomy     COLONOSCOPY  05/02/2013   PERCUTANEOUS CORONARY STENT INTERVENTION (PCI-S)     POLYPECTOMY     UMBILICAL HERNIA REPAIR         Review of Systems  Constitutional: Negative for appetite  change, chills, fatigue, fever and unexpected weight change.  Eyes: Negative for visual disturbance.  Respiratory: Negative for cough, chest tightness, shortness of breath and wheezing.   Cardiovascular: Negative for chest pain and leg swelling.  Gastrointestinal: Negative for abdominal pain, constipation, diarrhea, nausea and vomiting.  Genitourinary: Negative for dysuria, flank pain, frequency, genital sores, hematuria and urgency.  Skin: Negative for rash.  Allergic/Immunologic: Negative for immunocompromised state.  Neurological: Negative for dizziness and headaches.      Objective:    BP 131/83    Pulse 74    Temp 98.2 F (36.8 C)    Resp 16    Ht 6' (1.829 m)    Wt 214 lb (97.1 kg)    SpO2 94%    BMI 29.02 kg/m  Nursing note and vital signs reviewed.  Physical Exam Constitutional:      General: He is not in acute distress.    Appearance: He is well-developed.  Eyes:     Conjunctiva/sclera: Conjunctivae normal.  Cardiovascular:     Rate and Rhythm: Normal rate and regular rhythm.     Heart sounds: Normal heart sounds. No murmur heard.  No friction rub. No gallop.   Pulmonary:     Effort: Pulmonary effort is normal. No respiratory distress.     Breath sounds: Normal breath sounds. No wheezing or rales.  Chest:     Chest wall: No tenderness.  Abdominal:     General: Bowel sounds are normal.     Palpations: Abdomen is soft.     Tenderness: There is no abdominal tenderness.  Musculoskeletal:     Cervical back: Neck supple.  Lymphadenopathy:     Cervical: No cervical adenopathy.  Skin:    General: Skin is warm and dry.     Findings: No rash.  Neurological:     Mental Status: He is alert and oriented to person, place, and time.  Psychiatric:        Behavior: Behavior normal.        Thought Content: Thought content normal.        Judgment: Judgment normal.      Depression screen [facility] 2/9 12/08/2019 11/23/2018 10/04/2018 04/15/2017  Decreased Interest 0 0 0 0  Down,  Depressed, Hopeless 0 0 0 0  PHQ - 2 Score 0 0 0 0       Assessment & Plan:    Patient Active Problem List   Diagnosis Date Noted   Chronic chest wall pain 11/16/2019   Routine general medical examination at a health care facility 11/16/2019   Hyperlipidemia LDL goal <70 11/15/2019   Benign prostatic hyperplasia without lower urinary tract symptoms 11/15/2019   Interstitial pulmonary disease ([place]) 07/18/2019   Chronic bilateral low back pain without sciatica 03/02/2019  Muscle cramps 03/02/2019   Cirrhosis of liver without ascites ([place]) 01/08/2019   Secondary esophageal varices without bleeding ([place]) 01/08/2019   Intestinal metaplasia of gastric mucosa 01/08/2019   Hepatitis B core antibody positive 01/08/2019   Chronic cough 06/24/2018   Healthcare maintenance 05/11/2018   Hx of colonic polyp 04/27/2018   Change in bowel habit 04/27/2018   Ventral hernia without obstruction or gangrene 04/27/2018   Thrombocytopenia ([place]) 04/27/2018   Hypersplenism 04/27/2018   Positive hepatitis C antibody test 04/27/2018   Coronary artery disease without angina pectoris 02/25/2018   Obstructive sleep apnea 10/25/2017   Extrinsic asthma without complication 93/79/0240   Vitamin D deficiency 08/25/2017   HIV disease ([place]) 04/15/2017   Primary osteoarthritis of both knees 04/15/2017   Type 2 diabetes mellitus with hyperglycemia, without long-term current use of insulin ([place]) 04/15/2017   Essential hypertension 04/15/2017     Problem List Items Addressed This Visit      Other   HIV disease ([place])    Tyler Hendricks continues to have well-controlled HIV disease with good adherence and tolerance to his ART regimen of Biktarvy.  No signs/symptoms of opportunistic infection or progressive HIV disease.  Reviewed lab work and discussed plan of care.  Continue current dose of Biktarvy.  Plan for follow-up in 6 months or sooner if needed with lab work 1 to 2 weeks prior to  appointment.      Relevant Medications   bictegravir-emtricitabine-tenofovir AF (BIKTARVY) 50-200-25 MG TABS tablet   Healthcare maintenance     Influenza updated today.  Discussed importance of safe sexual practice to reduce risk of STI.  Routine dental care coming due and will schedule.       Other Visit Diagnoses    Need for immunization against influenza    -  Primary   Relevant Orders   Flu Vaccine QUAD High Dose([place]) (Completed)       I have discontinued Bowdy Pense's benzonatate and HYDROcodone-homatropine. I am also having him maintain his losartan-hydrochlorothiazide, multivitamin, sitaGLIPtin, clopidogrel, amLODipine, atenolol, famotidine, Synjardy XR, Flovent HFA, traMADol, Vascepa, rosuvastatin, and Biktarvy.   Meds ordered this encounter  Medications   DISCONTD: bictegravir-emtricitabine-tenofovir AF (BIKTARVY) 50-200-25 MG TABS tablet    Sig: Take 1 tablet by mouth daily.    Dispense:  30 tablet    Refill:  11    Order Specific Question:   Supervising Provider    Answer:   Baxter Flattery, CYNTHIA [[id number]]   bictegravir-emtricitabine-tenofovir AF (BIKTARVY) 50-200-25 MG TABS tablet    Sig: Take 1 tablet by mouth daily.    Dispense:  30 tablet    Refill:  11    Order Specific Question:   Supervising Provider    Answer:   Carlyle Basques [[id number]]     Follow-up: Return in about 6 months (around 06/06/2020).   [name], MSN, FNP-C Nurse Practitioner [facility] [place] number: [phone]

## 2019-12-08 NOTE — Assessment & Plan Note (Signed)
Tyler Hendricks continues to have well-controlled HIV disease with good adherence and tolerance to his ART regimen of Biktarvy.  No signs/symptoms of opportunistic infection or progressive HIV disease.  Reviewed lab work and discussed plan of care.  Continue current dose of Biktarvy.  Plan for follow-up in 6 months or sooner if needed with lab work 1 to 2 weeks prior to appointment.

## 2019-12-08 NOTE — Patient Instructions (Signed)
Nice to see you.  Continue take your [place] daily as prescribed.  Refills have been sent to the pharmacy.  Plan for follow-up in 6 months or sooner if needed with lab work 1 to 2 weeks prior to appointment.  Have a great day and stay safe!

## 2019-12-08 NOTE — Assessment & Plan Note (Signed)
   Influenza updated today.  Discussed importance of safe sexual practice to reduce risk of STI.  Routine dental care coming due and will schedule.

## 2019-12-13 ENCOUNTER — Telehealth: Payer: Self-pay | Admitting: Pulmonary Disease

## 2019-12-13 NOTE — Telephone Encounter (Signed)
Spoke with patient regarding prior message.Advised patient regarding refill for Hycodan syrup needs sent to [facility] for a approval. Please send into patient's choice of pharmacy.  Dr.Mannam can you please advise.  Thank you

## 2019-12-14 MED ORDER — BENZONATATE 200 MG PO CAPS: 200.0000 mg | ORAL_CAPSULE | Freq: Two times a day (BID) | ORAL | 0 refills | Status: DC | PRN

## 2019-12-14 NOTE — Telephone Encounter (Signed)
We would like to avoid excessive use of hycodan. Call in tessalon 200 mg bid as needed

## 2019-12-14 NOTE — Telephone Encounter (Signed)
Spoke with the pt and notified of response per Dr Vaughan Browner  He verbalized understanding  Rx for tessalon was sent to pharm

## 2019-12-18 ENCOUNTER — Telehealth: Payer: Self-pay | Admitting: Pharmacy Technician

## 2019-12-18 MED FILL — BIKTARVY 50-200-25 MG TABS: 50-200-25 | 30 days supply | Qty: 30 | Fill #0

## 2019-12-18 NOTE — Telephone Encounter (Signed)
[facility] Patient Advocate Encounter   I was successful in securing patient a $7500 grant from [employer] [place] (PAF) to provide copayment coverage for Biktarvy.  This will make the out of pocket cost $0.     The billing information is as follows and has been shared with [place].     Patient knows to call the office with questions or concerns.  Venida Jarvis. Nadara Mustard [place] Patient [facility] Phone: [phone] Fax:  [phone]

## 2019-12-22 ENCOUNTER — Other Ambulatory Visit: Payer: Self-pay

## 2019-12-22 ENCOUNTER — Ambulatory Visit (INDEPENDENT_AMBULATORY_CARE_PROVIDER_SITE_OTHER): Payer: Medicare Other

## 2019-12-22 ENCOUNTER — Other Ambulatory Visit: Payer: Self-pay | Admitting: Pulmonary Disease

## 2019-12-22 DIAGNOSIS — Z23 Encounter for immunization: Secondary | ICD-10-CM | POA: Diagnosis not present

## 2019-12-22 NOTE — Telephone Encounter (Signed)
Dr. Mannam, please advise if you are okay with us refilling med. 

## 2019-12-22 NOTE — Progress Notes (Signed)
   Covid-19 Vaccination Clinic  Name:  Antoinette Haskett    MRN: [id number] DOB: 04/06/1950  12/22/2019  Mr. Brockman was observed post Covid-19 immunization for 15 minutes without incident. He was provided with Vaccine Information Sheet and instruction to access the V-Safe system.   Mr. Werber was instructed to call 911 with any severe reactions post vaccine: Marland Kitchen Difficulty breathing  . Swelling of face and throat  . A fast heartbeat  . A bad rash all over body  . Dizziness and weakness     Beryle Flock, RN

## 2019-12-26 ENCOUNTER — Ambulatory Visit (INDEPENDENT_AMBULATORY_CARE_PROVIDER_SITE_OTHER): Payer: Medicare Other | Admitting: Pulmonary Disease

## 2019-12-26 ENCOUNTER — Other Ambulatory Visit: Payer: Self-pay

## 2019-12-26 ENCOUNTER — Encounter: Payer: Self-pay | Admitting: Pulmonary Disease

## 2019-12-26 ENCOUNTER — Telehealth: Payer: Self-pay | Admitting: Pulmonary Disease

## 2019-12-26 VITALS — BP 132/70 | HR 66 | Temp 97.0°F | Ht 71.0 in | Wt 212.8 lb

## 2019-12-26 DIAGNOSIS — J849 Interstitial pulmonary disease, unspecified: Secondary | ICD-10-CM | POA: Diagnosis not present

## 2019-12-26 DIAGNOSIS — G4733 Obstructive sleep apnea (adult) (pediatric): Secondary | ICD-10-CM

## 2019-12-26 DIAGNOSIS — I25118 Atherosclerotic heart disease of native coronary artery with other forms of angina pectoris: Secondary | ICD-10-CM

## 2019-12-26 MED ORDER — BENZONATATE 200 MG PO CAPS: ORAL_CAPSULE | ORAL | 3 refills | Status: DC

## 2019-12-26 NOTE — Progress Notes (Signed)
Tyler Hendricks    [id number]    1950/04/17  Primary Care Physician:Crawford, Real Cons, MD  Referring Physician: Hoyt Koch, MD [address] [place],  [place] [address]  Chief complaint: Follow-up for pulmonary fibrosis  HPI: 69 year old with history of coronary artery disease, HIV, hepatitis C, cirrhosis Complains of chronic cough for the past several years. He has paroxysms of cough associated with dyspnea. Mild chest congestion History notable for COVID-19 infection in December 2021. He had very mild symptoms and did not require hospitalization.  CT scan with probable UIP pattern pulmonary fibrosis. He has been referred to the ILD clinic for further evaluation  Pets: Has a dog. No cats, birds, farm animals Occupation: Retired Development worker, community Exposures: No known exposures. No mold, hot tub, Jacuzzi. No down pillows or comforters ILD questionnaire 10/03/2019-negative Smoking history: 60-pack-year smoker. Quit in late 1990s Travel history: Originally from [place]. Moved to [place] in 2018 Relevant family history: Mother had emphysema. She was a heavy smoker.  Outpatient Encounter Medications as of 12/26/2019  Medication Sig  . amLODipine (NORVASC) 2.5 MG tablet Take 2.5 mg by mouth daily.  Marland Kitchen atenolol (TENORMIN) 50 MG tablet Take 50 mg by mouth daily.  . benzonatate (TESSALON) 200 MG capsule TAKE 1 CAPSULE(200 MG) BY MOUTH THREE TIMES DAILY AS NEEDED FOR COUGH  . bictegravir-emtricitabine-tenofovir AF (BIKTARVY) 50-200-25 MG TABS tablet Take 1 tablet by mouth daily.  . clopidogrel (PLAVIX) 75 MG tablet TAKE 1 TABLET(75 MG) BY MOUTH DAILY  . FLOVENT HFA 110 MCG/ACT inhaler INHALE 2 PUFFS INTO THE LUNGS TWICE DAILY  . losartan-hydrochlorothiazide (HYZAAR) 100-25 MG tablet Take 1 tablet by mouth daily.   . Multiple Vitamin (MULTIVITAMIN) tablet Take 1 tablet by mouth daily.  . rosuvastatin (CRESTOR) 10 MG tablet TAKE 1 TABLET(10 MG)  BY MOUTH DAILY  . sitaGLIPtin (JANUVIA) 100 MG tablet Take 100 mg by mouth daily.  Marland Kitchen SYNJARDY XR 12.07-998 MG TB24 TK 2 TS PO QD  . traMADol (ULTRAM) 50 MG tablet Take 1 tablet (50 mg total) by mouth every 6 (six) hours as needed.  Marland Kitchen VASCEPA 1 g capsule Take 2 capsules (2 g total) by mouth 2 (two) times daily.  . famotidine (PEPCID) 20 MG tablet Take 1 tablet (20 mg total) by mouth at bedtime. (Patient not taking: Reported on 12/26/2019)   No facility-administered encounter medications on file as of 12/26/2019.    Physical Exam: Blood pressure 132/70, pulse 66, temperature (!) 97 F (36.1 C), temperature source Skin, height 5\' 11"  (1.803 m), weight 212 lb 12.8 oz (96.5 kg), SpO2 94 %. Gen:      No acute distress HEENT:  EOMI, sclera anicteric Neck:     No masses; no thyromegaly Lungs:    Clear to auscultation bilaterally; normal respiratory effort CV:         Regular rate and rhythm; no murmurs Abd:      + bowel sounds; soft, non-tender; no palpable masses, no distension Ext:    No edema; adequate peripheral perfusion Skin:      Warm and dry; no rash Neuro: alert and oriented x 3 Psych: normal mood and affect  Data Reviewed: Imaging: High-res CT 07/13/2019-groundglass attenuation with septal thickening, cylindrical bronchiectasis with mild basal gradient Probable UIP pattern  PFTs: 09/01/2019 FVC 3.78 [78%], FEV1 3.14 [87%], F/F 83, TLC 6.40 [86%], DLCO 22.76 [81%] Normal test  Labs: CTD serologies 08/30/2019-positive for ANA 1:40, nuclear, speckled  Assessment:  Pulmonary fibrosis, IPF Has probable UIP pattern on imaging There is no symptoms of connective tissue disease. Borderline positive ANA is likely nonspecific He did have Covid in December 2020 but it was a very mild case with no hospitalization, besides his symptoms preceded the Covid infection  He likely has IPF based on CT appearance in male smoker. Discussed further work-up such as bronchoscopy, lung biopsy but he  wants to avoid invasive procedures Reviewed treatment with antifibrotics. We will have to be cautious with these given history of cirrhosis  He has returned today with his wife to go over his options again.  Looks like they are not ready to make a decision yet and will talk further about.  Severe OSA Compliant with CPAP.  We will try to obtain the download to review.  Plan/Recommendations: Ongoing discussions about treatment for pulmonary fibrosis Follow-up in 1 to 2 months.  Marshell Garfinkel MD [facility] 12/26/2019, 10:14 AM  CC: Hoyt Koch, *

## 2019-12-26 NOTE — Addendum Note (Signed)
Addended by: Elton Sin on: 12/26/2019 10:46 AM   Modules accepted: Orders

## 2019-12-26 NOTE — Telephone Encounter (Signed)
Spoke with the pt  He was seen in office today by Dr Astrid Drafts that he forgot to ask for rx for Hycodan  We prescribed more tessalon, and he states already has a lot left at home and does not want to buy more bc it does not work for him  Please advise, thanks!

## 2019-12-26 NOTE — Patient Instructions (Addendum)
Please continue further discussions about starting therapy for the pulmonary fibrosis  Please send an email to a local support group for IPF as they have a really good resources for patients  [email or web address]  Follow-up in 1 to 2 months.

## 2019-12-27 DIAGNOSIS — L218 Other seborrheic dermatitis: Secondary | ICD-10-CM | POA: Diagnosis not present

## 2019-12-27 DIAGNOSIS — L814 Other melanin hyperpigmentation: Secondary | ICD-10-CM | POA: Diagnosis not present

## 2019-12-27 DIAGNOSIS — L821 Other seborrheic keratosis: Secondary | ICD-10-CM | POA: Diagnosis not present

## 2019-12-27 DIAGNOSIS — D229 Melanocytic nevi, unspecified: Secondary | ICD-10-CM | POA: Diagnosis not present

## 2019-12-27 DIAGNOSIS — L570 Actinic keratosis: Secondary | ICD-10-CM | POA: Diagnosis not present

## 2019-12-27 MED ORDER — HYDROCODONE-HOMATROPINE 5-1.5 MG/5ML PO SYRP: 5.0000 mL | ORAL_SOLUTION | Freq: Four times a day (QID) | ORAL | 0 refills | Status: DC | PRN

## 2019-12-27 NOTE — Telephone Encounter (Signed)
OK I sent it in

## 2019-12-27 NOTE — Telephone Encounter (Signed)
Called and spoke with pt letting him know that Dr. Vaughan Browner sent in Rx for hycodan and he verbalized understanding. Nothing further needed.

## 2020-01-03 ENCOUNTER — Encounter: Payer: Self-pay | Admitting: Internal Medicine

## 2020-01-03 ENCOUNTER — Ambulatory Visit (INDEPENDENT_AMBULATORY_CARE_PROVIDER_SITE_OTHER): Payer: Medicare Other | Admitting: Internal Medicine

## 2020-01-03 ENCOUNTER — Other Ambulatory Visit: Payer: Self-pay

## 2020-01-03 VITALS — BP 136/80 | HR 66 | Temp 98.1°F | Resp 16 | Ht 71.0 in | Wt 214.0 lb

## 2020-01-03 DIAGNOSIS — M4802 Spinal stenosis, cervical region: Secondary | ICD-10-CM

## 2020-01-03 DIAGNOSIS — I25118 Atherosclerotic heart disease of native coronary artery with other forms of angina pectoris: Secondary | ICD-10-CM | POA: Diagnosis not present

## 2020-01-03 DIAGNOSIS — E1165 Type 2 diabetes mellitus with hyperglycemia: Secondary | ICD-10-CM | POA: Diagnosis not present

## 2020-01-03 DIAGNOSIS — I10 Essential (primary) hypertension: Secondary | ICD-10-CM | POA: Diagnosis not present

## 2020-01-03 MED ORDER — AMLODIPINE BESYLATE 2.5 MG PO TABS: 2.5000 mg | ORAL_TABLET | Freq: Every day | ORAL | 1 refills | Status: DC

## 2020-01-03 MED ORDER — SYNJARDY XR 12.5-1000 MG PO TB24: ORAL_TABLET | ORAL | 1 refills | Status: DC

## 2020-01-03 MED ORDER — METHYLPREDNISOLONE 4 MG PO TBPK: ORAL_TABLET | ORAL | 0 refills | Status: AC

## 2020-01-03 MED ORDER — OXYCODONE HCL 5 MG PO TABS: 5.0000 mg | ORAL_TABLET | Freq: Four times a day (QID) | ORAL | 0 refills | Status: DC | PRN

## 2020-01-03 NOTE — Patient Instructions (Signed)
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
## 2020-01-04 ENCOUNTER — Encounter: Payer: Self-pay | Admitting: Internal Medicine

## 2020-01-09 ENCOUNTER — Encounter: Payer: Self-pay | Admitting: Adult Health

## 2020-01-18 MED FILL — BIKTARVY 50-200-25 MG TABS: 50-200-25 | 30 days supply | Qty: 30 | Fill #1

## 2020-01-23 ENCOUNTER — Telehealth: Payer: Self-pay | Admitting: Pulmonary Disease

## 2020-01-23 MED ORDER — HYDROCODONE-HOMATROPINE 5-1.5 MG/5ML PO SYRP: 5.0000 mL | ORAL_SOLUTION | Freq: Four times a day (QID) | ORAL | 0 refills | Status: DC | PRN

## 2020-01-23 NOTE — Telephone Encounter (Signed)
Pt aware of refill. Nothing further needed at this time- will close encounter.   

## 2020-01-23 NOTE — Telephone Encounter (Signed)
Ok. Done 

## 2020-01-23 NOTE — Telephone Encounter (Signed)
Spoke with pt. He is requesting a refill on Hycodan. This was last refilled on 12/27/19 for #251mL by Dr. Vaughan Browner.  Dr. Vaughan Browner - please advise, if approved you will have to send this prescription in. Thank you!

## 2020-01-26 ENCOUNTER — Ambulatory Visit (INDEPENDENT_AMBULATORY_CARE_PROVIDER_SITE_OTHER): Payer: Medicare Other | Admitting: Pulmonary Disease

## 2020-01-26 ENCOUNTER — Encounter: Payer: Self-pay | Admitting: Pulmonary Disease

## 2020-01-26 ENCOUNTER — Other Ambulatory Visit: Payer: Self-pay

## 2020-01-26 VITALS — BP 126/64 | HR 83 | Temp 97.6°F | Ht 71.0 in | Wt 222.8 lb

## 2020-01-26 DIAGNOSIS — I25118 Atherosclerotic heart disease of native coronary artery with other forms of angina pectoris: Secondary | ICD-10-CM

## 2020-01-26 DIAGNOSIS — J84112 Idiopathic pulmonary fibrosis: Secondary | ICD-10-CM | POA: Diagnosis not present

## 2020-01-26 DIAGNOSIS — J849 Interstitial pulmonary disease, unspecified: Secondary | ICD-10-CM

## 2020-01-26 DIAGNOSIS — G4733 Obstructive sleep apnea (adult) (pediatric): Secondary | ICD-10-CM

## 2020-01-26 NOTE — Addendum Note (Signed)
Addended by: Elton Sin on: 01/26/2020 09:37 AM   Modules accepted: Orders

## 2020-01-26 NOTE — Patient Instructions (Signed)
We will get a follow-up CT and PFTs in April 2022 and reevaluate at that time  Follow-up in clinic after these tests

## 2020-01-26 NOTE — Progress Notes (Signed)
Tyler Hendricks    [id number]    12-11-1950  Primary Care Physician:Crawford, Real Cons, MD  Referring Physician: Hoyt Koch, MD [address] [place],  [place] [address]  Chief complaint: Follow-up for pulmonary fibrosis  HPI: 69 year old with history of coronary artery disease, HIV, hepatitis C, cirrhosis Complains of chronic cough for the past several years. He has paroxysms of cough associated with dyspnea. Mild chest congestion History notable for COVID-19 infection in December 2021. He had very mild symptoms and did not require hospitalization.  CT scan with probable UIP pattern pulmonary fibrosis. He has been referred to the ILD clinic for further evaluation  Pets: Has a dog. No cats, birds, farm animals Occupation: Retired Development worker, community Exposures: No known exposures. No mold, hot tub, Jacuzzi. No down pillows or comforters ILD questionnaire 10/03/2019-negative Smoking history: 60-pack-year smoker. Quit in late 1990s Travel history: Originally from [place]. Moved to [place] in 2018 Relevant family history: Mother had emphysema. She was a heavy smoker.  Outpatient Encounter Medications as of 01/26/2020  Medication Sig  . amLODipine (NORVASC) 2.5 MG tablet Take 1 tablet (2.5 mg total) by mouth daily.  Marland Kitchen atenolol (TENORMIN) 50 MG tablet Take 50 mg by mouth daily.  . benzonatate (TESSALON) 200 MG capsule TAKE 1 CAPSULE(200 MG) BY MOUTH THREE TIMES DAILY AS NEEDED FOR COUGH  . bictegravir-emtricitabine-tenofovir AF (BIKTARVY) 50-200-25 MG TABS tablet Take 1 tablet by mouth daily.  . clopidogrel (PLAVIX) 75 MG tablet TAKE 1 TABLET(75 MG) BY MOUTH DAILY  . famotidine (PEPCID) 20 MG tablet Take 1 tablet (20 mg total) by mouth at bedtime.  Marland Kitchen FLOVENT HFA 110 MCG/ACT inhaler INHALE 2 PUFFS INTO THE LUNGS TWICE DAILY  . HYDROcodone-homatropine (HYCODAN) 5-1.5 MG/5ML syrup Take 5 mLs by mouth every 6 (six) hours as needed for cough.   . losartan-hydrochlorothiazide (HYZAAR) 100-25 MG tablet Take 1 tablet by mouth daily.   . Multiple Vitamin (MULTIVITAMIN) tablet Take 1 tablet by mouth daily.  Marland Kitchen oxyCODONE (OXY IR/ROXICODONE) 5 MG immediate release tablet Take 1 tablet (5 mg total) by mouth every 6 (six) hours as needed for severe pain.  . rosuvastatin (CRESTOR) 10 MG tablet TAKE 1 TABLET(10 MG) BY MOUTH DAILY  . SYNJARDY XR 12.07-998 MG TB24 TK 2 TS PO QD  . VASCEPA 1 g capsule Take 2 capsules (2 g total) by mouth 2 (two) times daily.   No facility-administered encounter medications on file as of 01/26/2020.    Physical Exam: Blood pressure 132/70, pulse 66, temperature (!) 97 F (36.1 C), temperature source Skin, height 5\' 11"  (1.803 m), weight 212 lb 12.8 oz (96.5 kg), SpO2 94 %. Gen:      No acute distress HEENT:  EOMI, sclera anicteric Neck:     No masses; no thyromegaly Lungs:    Clear to auscultation bilaterally; normal respiratory effort CV:         Regular rate and rhythm; no murmurs Abd:      + bowel sounds; soft, non-tender; no palpable masses, no distension Ext:    No edema; adequate peripheral perfusion Skin:      Warm and dry; no rash Neuro: alert and oriented x 3 Psych: normal mood and affect  Data Reviewed: Imaging: High-res CT 07/13/2019-groundglass attenuation with septal thickening, cylindrical bronchiectasis with mild basal gradient Probable UIP pattern  PFTs: 09/01/2019 FVC 3.78 [78%], FEV1 3.14 [87%], F/F 83, TLC 6.40 [86%], DLCO 22.76 [81%] Normal test  Labs: CTD serologies 08/30/2019-positive for ANA 1:40, nuclear, speckled  Sleep: CPAP download 11/25/2019-2% compliance  Assessment:  Pulmonary fibrosis, IPF Has probable UIP pattern on imaging There is no symptoms of connective tissue disease. Borderline positive ANA is likely nonspecific He did have Covid in December 2020 but it was a very mild case with no hospitalization, besides his symptoms preceded the Covid infection  He likely  has IPF based on CT appearance in male smoker. Discussed further work-up such as bronchoscopy, lung biopsy but he wants to avoid invasive procedures Reviewed treatment with antifibrotics. We will have to be cautious with these given history of cirrhosis  We had multiple discussions about antifibrotic's but he is not ready to make a commitment yet We will get a follow-up CT and PFTs in April 2022 and reevaluate at that time  Severe OSA Noncompliant with CPAP.  Encouraged him to use it daily.  Health maintenance Up-to-date with Covid booster and flu vaccine  Plan/Recommendations: Ongoing discussions about treatment for pulmonary fibrosis High-res CT and PFTs in April 2022  Marshell Garfinkel MD [facility] 01/26/2020, 9:18 AM  CC: Hoyt Koch, *

## 2020-02-05 ENCOUNTER — Telehealth: Payer: Self-pay | Admitting: Internal Medicine

## 2020-02-05 NOTE — Telephone Encounter (Signed)
oxyCODONE (OXY IR/ROXICODONE) 5 MG immediate release tablet [facility] [place], [address] [place] Phone:  [phone]  Fax:  [phone]     Requesting a refill

## 2020-02-07 DIAGNOSIS — M5412 Radiculopathy, cervical region: Secondary | ICD-10-CM | POA: Diagnosis not present

## 2020-02-07 DIAGNOSIS — M12819 Other specific arthropathies, not elsewhere classified, unspecified shoulder: Secondary | ICD-10-CM | POA: Diagnosis not present

## 2020-02-08 NOTE — Telephone Encounter (Signed)
This is not a chronic medication and therefore not eligible for refill.

## 2020-02-12 NOTE — Telephone Encounter (Signed)
LDVM for pt regarding response to med request.

## 2020-02-20 ENCOUNTER — Telehealth: Payer: Self-pay | Admitting: Pulmonary Disease

## 2020-02-20 ENCOUNTER — Other Ambulatory Visit: Payer: Self-pay | Admitting: Internal Medicine

## 2020-02-20 NOTE — Telephone Encounter (Signed)
lmtcb for pt.  

## 2020-02-20 NOTE — Telephone Encounter (Signed)
LM for pt to call back.

## 2020-02-21 MED ORDER — HYDROCODONE-HOMATROPINE 5-1.5 MG/5ML PO SYRP: 5.0000 mL | ORAL_SOLUTION | Freq: Four times a day (QID) | ORAL | 0 refills | Status: DC | PRN

## 2020-02-21 MED FILL — BIKTARVY 50-200-25 MG TABS: 50-200-25 | 30 days supply | Qty: 30 | Fill #2

## 2020-02-21 NOTE — Telephone Encounter (Signed)
Attempted to call pt but unable to reach. Left message for him to return call. °

## 2020-02-21 NOTE — Telephone Encounter (Signed)
ATC patient to let him know that refill was sent in, per DPR left detailed message. Nothing further need at this time.

## 2020-02-21 NOTE — Telephone Encounter (Signed)
Called and spoke with patient, advised patient that he had his cough medication filled by Dr. Vaughan Browner on 01/23/20.  He asked if today was December 1st?  I advised him it was, he stated that he thought is could be filled every 30 days.  I advised him I would send a message to Dr. Vaughan Browner and we will let him know after we hear back from him.  He states the cough is sometimes dry and other times he gets up light yellow phlegm, this has been for the past 1-2 days.  Denies fever, chills, body aches, sick contacts.  Denies any SOB.  He has had his covid vaccines.  Dr. Vaughan Browner, Please advise if patient's cough syrup is able to be refilled.  Thank you.

## 2020-02-21 NOTE — Telephone Encounter (Signed)
Pt is returning call.  

## 2020-02-21 NOTE — Telephone Encounter (Signed)
Pt returning call - he is still needing his cough syrup refilled - [facility] on [address] - CB# [phone]

## 2020-02-21 NOTE — Telephone Encounter (Signed)
I sent in a refill.

## 2020-02-21 NOTE — Telephone Encounter (Signed)
ATC patient unable to reach LM to call back office (x1)  Looks like he had it 01/23/20 RX'd by Dr. Vaughan Browner

## 2020-02-28 ENCOUNTER — Telehealth: Payer: Self-pay | Admitting: Internal Medicine

## 2020-02-28 MED ORDER — ATENOLOL 50 MG PO TABS
50.0000 mg | ORAL_TABLET | Freq: Every day | ORAL | 2 refills | Status: DC
Start: 2020-02-28 — End: 2020-05-15

## 2020-02-28 NOTE — Telephone Encounter (Signed)
Upon investigation, pt was here for annual 11/15/19 & as noted in Dr Ronnald Ramp note, pt was to maintain atenolol. Medication refilled per request to pharmacy requested.

## 2020-02-28 NOTE — Telephone Encounter (Signed)
° °  Patient calling to request refill today for atenolol (TENORMIN) 50 MG tablet Scheduler advised patient several times Dr Sharlet Salina did not prescribe medication.  Patient requesting refill today to [facility], [place] AT [place]

## 2020-02-29 ENCOUNTER — Other Ambulatory Visit: Payer: Self-pay | Admitting: Primary Care

## 2020-02-29 DIAGNOSIS — R0982 Postnasal drip: Secondary | ICD-10-CM

## 2020-03-15 ENCOUNTER — Other Ambulatory Visit: Payer: Self-pay | Admitting: Internal Medicine

## 2020-03-20 MED FILL — BIKTARVY 50-200-25 MG TABS: 50-200-25 | 30 days supply | Qty: 30 | Fill #3

## 2020-03-26 ENCOUNTER — Other Ambulatory Visit: Payer: Self-pay | Admitting: Pulmonary Disease

## 2020-03-26 ENCOUNTER — Encounter: Payer: Self-pay | Admitting: Internal Medicine

## 2020-03-26 NOTE — Telephone Encounter (Signed)
Patient requesting a refill on Hycodan. Last refill: 02/21/2020 #229ml with 0 refills, take 52ml q6h prn cough Last OV: 01/26/20 Next OV:  None- recall in chart for 07/2020.  rx has been pended in chart.  Dr. Isaiah Serge please advise, thanks!

## 2020-03-28 MED ORDER — HYDROCODONE-HOMATROPINE 5-1.5 MG/5ML PO SYRP: 5.0000 mL | ORAL_SOLUTION | Freq: Four times a day (QID) | ORAL | 0 refills | Status: DC | PRN

## 2020-04-01 ENCOUNTER — Other Ambulatory Visit: Payer: Self-pay | Admitting: Internal Medicine

## 2020-04-04 ENCOUNTER — Other Ambulatory Visit: Payer: Self-pay | Admitting: Internal Medicine

## 2020-04-04 DIAGNOSIS — I10 Essential (primary) hypertension: Secondary | ICD-10-CM

## 2020-04-17 MED FILL — BIKTARVY 50-200-25 MG TABS: 50-200-25 | 30 days supply | Qty: 30 | Fill #4

## 2020-04-22 ENCOUNTER — Telehealth: Payer: Self-pay | Admitting: Pulmonary Disease

## 2020-04-22 NOTE — Telephone Encounter (Signed)
Called spoke with patient. Let him know Dr. Vaughan Browner would not be back on day shift until Friday so it might a while before he would hear a response. Patient voiced understanding.   Dr. Vaughan Browner please advise on refill hycodan for patient

## 2020-04-23 ENCOUNTER — Other Ambulatory Visit: Payer: Self-pay | Admitting: Pulmonary Disease

## 2020-04-23 MED ORDER — HYDROCODONE-HOMATROPINE 5-1.5 MG/5ML PO SYRP: 5.0000 mL | ORAL_SOLUTION | Freq: Four times a day (QID) | ORAL | 0 refills | Status: DC | PRN

## 2020-04-23 MED FILL — HYCODAN 5-1.5 MG/5ML SYRP: 5-1.5 | 12 days supply | Qty: 240 | Fill #0

## 2020-04-23 NOTE — Telephone Encounter (Signed)
Refilled hycodan ?

## 2020-04-23 NOTE — Telephone Encounter (Signed)
ATC x1, left detailed message per [facility] regarding prescription being sent to his pharmacy.  Advised to call the office with any questions.

## 2020-04-24 DIAGNOSIS — M419 Scoliosis, unspecified: Secondary | ICD-10-CM | POA: Insufficient documentation

## 2020-04-24 DIAGNOSIS — M48061 Spinal stenosis, lumbar region without neurogenic claudication: Secondary | ICD-10-CM | POA: Diagnosis not present

## 2020-04-24 DIAGNOSIS — M545 Low back pain, unspecified: Secondary | ICD-10-CM | POA: Insufficient documentation

## 2020-04-29 ENCOUNTER — Ambulatory Visit (INDEPENDENT_AMBULATORY_CARE_PROVIDER_SITE_OTHER): Payer: Medicare Other | Admitting: Adult Health

## 2020-04-29 ENCOUNTER — Encounter: Payer: Self-pay | Admitting: Adult Health

## 2020-04-29 VITALS — BP 161/93 | HR 95 | Ht 71.0 in | Wt 210.0 lb

## 2020-04-29 DIAGNOSIS — G4733 Obstructive sleep apnea (adult) (pediatric): Secondary | ICD-10-CM | POA: Diagnosis not present

## 2020-04-29 DIAGNOSIS — Z9989 Dependence on other enabling machines and devices: Secondary | ICD-10-CM

## 2020-04-29 NOTE — Patient Instructions (Signed)
Restart using CPAP nightly and greater than 4 hours each night ?If your symptoms worsen or you develop new symptoms please let us know.  ? ?

## 2020-04-29 NOTE — Progress Notes (Signed)
PATIENT: Tyler Hendricks DOB: 07/07/1950  REASON FOR VISIT: follow up HISTORY FROM: patient  HISTORY OF PRESENT ILLNESS: Today 04/29/20:  Tyler Hendricks is a 70 year old male with a history of obstructive sleep apnea on CPAP.  He reports that he has not been using the machine for the last 6 months.  He reports that he got out of the habit of using it.  Reports that sometimes he will fall asleep on the couch and that he does not put his machine on.  He states for the last 2 days he has restarted using it.  He returns today for evaluation.  04/26/19 Tyler Hendricks is a 70 year old male with a history of obstructive sleep apnea on CPAP.  His download indicates that he uses machine 21 out of 30 days for compliance of 70%.  He uses machine greater than 4 hours 11 days for compliance of 37%.  On average he uses his machine 4 hours and 22 minutes.  His residual AHI is 3.6 on 7 to 15 cm of water with EPR of 2.  His leak in the 95th percentile is 9.6 L/min.  He reports that there are some nights he gets up to the bathroom and does not put his machine back on.  He still feels that the CPAP works well for him.  His Epworth sleepiness score is 610 fatigue severity score is 13.  HISTORY  10/13/18: Tyler Hendricks is a 70 year old male with a history of obstructive sleep apnea on CPAP.  He returns today for follow-up.  His download indicates that he use his machine 23 out of 30 days for compliance of 77%.  He uses machine greater than 4 hours 11 days for compliance of 37%.  On average he uses his machine 3 hours and 56 minutes.  His residual AHI is 4.2 on 7 to 15 cm of water with EPR of 2.  His leak in the 95th percentile is 9.8.  He reports that some nights he does not get more than 4 hours of sleep.  He states that he is getting more used to the mask.  His Epworth sleepiness score is 2 and fatigue severity score is 16.  He returns today for follow-up.  REVIEW OF SYSTEMS: Out of a complete 14 system review of symptoms, the patient  complains only of the following symptoms, and all other reviewed systems are negative.  Fatigue severity score 34 Epworth sleepiness score 13  ALLERGIES: Allergies  Allergen Reactions  . Lopressor [Metoprolol Tartrate] Rash and Other (See Comments)    Bleeding (non-specific)   . Integrilin [Eptifibatide] Other (See Comments)    Bleeding (non-specific)    HOME MEDICATIONS: Outpatient Medications Prior to Visit  Medication Sig Dispense Refill  . amLODipine (NORVASC) 2.5 MG tablet TAKE 1 TABLET(2.5 MG) BY MOUTH DAILY 90 tablet 1  . atenolol (TENORMIN) 50 MG tablet Take 1 tablet (50 mg total) by mouth daily. 90 tablet 2  . benzonatate (TESSALON) 200 MG capsule TAKE 1 CAPSULE(200 MG) BY MOUTH THREE TIMES DAILY AS NEEDED FOR COUGH 90 capsule 3  . bictegravir-emtricitabine-tenofovir AF (BIKTARVY) 50-200-25 MG TABS tablet Take 1 tablet by mouth daily. 30 tablet 11  . clopidogrel (PLAVIX) 75 MG tablet TAKE 1 TABLET(75 MG) BY MOUTH DAILY 90 tablet 3  . famotidine (PEPCID) 20 MG tablet Take 1 tablet (20 mg total) by mouth at bedtime. 30 tablet 3  . FLOVENT HFA 110 MCG/ACT inhaler INHALE 2 PUFFS INTO THE LUNGS TWICE DAILY 12 g 3  .  fluticasone (FLONASE) 50 MCG/ACT nasal spray SHAKE LIQUID AND USE 1 SPRAY IN EACH NOSTRIL DAILY 16 g 2  . HYDROcodone-homatropine (HYCODAN) 5-1.5 MG/5ML syrup Take 5 mLs by mouth every 6 (six) hours as needed for cough. 240 mL 0  . losartan-hydrochlorothiazide (HYZAAR) 100-25 MG tablet TAKE 1 TABLET BY MOUTH EVERY DAY 90 tablet 1  . Multiple Vitamin (MULTIVITAMIN) tablet Take 1 tablet by mouth daily.    . rosuvastatin (CRESTOR) 10 MG tablet TAKE 1 TABLET(10 MG) BY MOUTH DAILY 90 tablet 1  . SYNJARDY XR 12.07-998 MG TB24 TK 2 TS PO QD 180 tablet 1  . VASCEPA 1 g capsule Take 2 capsules (2 g total) by mouth 2 (two) times daily. 360 capsule 1  . oxyCODONE (OXY IR/ROXICODONE) 5 MG immediate release tablet Take 1 tablet (5 mg total) by mouth every 6 (six) hours as needed for  severe pain. 75 tablet 0   No facility-administered medications prior to visit.    PAST MEDICAL HISTORY: Past Medical History:  Diagnosis Date  . Abnormal result of cardiovascular function study, unspecified   . Atherosclerotic heart disease of native coronary artery without angina pectoris   . CAD (coronary artery disease)   . Coronary angioplasty status   . Diabetes mellitus without complication ([place])   . Essential (primary) hypertension   . Gallstones   . Hepatic cirrhosis ([place])   . Hepatitis C   . HIV infection ([place])   . Hyperlipidemia   . Hypertension   . Internal hemorrhoids   . Kidney stones   . Mixed hyperlipidemia   . Palpitations   . Sleep apnea    CPAP  . Tubular adenoma of colon     PAST SURGICAL HISTORY: Past Surgical History:  Procedure Laterality Date  . CARDIAC CATHETERIZATION Left 04/2013  . chlecystectomy    . COLONOSCOPY  05/02/2013  . PERCUTANEOUS CORONARY STENT INTERVENTION (PCI-S)    . POLYPECTOMY    . UMBILICAL HERNIA REPAIR      FAMILY HISTORY: Family History  Problem Relation Age of Onset  . Emphysema Mother        pulmonary  . Heart attack Father 23  . Lung cancer Sister   . Esophageal cancer Brother   . Esophageal cancer Brother   . Colon cancer Neg Hx   . Inflammatory bowel disease Neg Hx   . Liver disease Neg Hx   . Pancreatic cancer Neg Hx   . Rectal cancer Neg Hx   . Stomach cancer Neg Hx   . Colon polyps Neg Hx     SOCIAL HISTORY: Social History   Socioeconomic History  . Marital status: Married    Spouse name: Not on file  . Number of children: 1  . Years of education: Not on file  . Highest education level: 10th grade  Occupational History  . Occupation: retired  Tobacco Use  . Smoking status: Former Smoker    Packs/day: 2.00    Years: 20.00    Pack years: 40.00    Types: Cigarettes    Start date: 25    Quit date: 1994    Years since quitting: 28.1  . Smokeless tobacco: Never Used  Vaping Use  . Vaping  Use: Never used  Substance and Sexual Activity  . Alcohol use: Yes    Comment: social-occ beer  . Drug use: No  . Sexual activity: Yes  Other Topics Concern  . Not on file  Social History Narrative  . Not on file  Social Determinants of Health   Financial Resource Strain: Not on file  Food Insecurity: Not on file  Transportation Needs: Not on file  Physical Activity: Not on file  Stress: Not on file  Social Connections: Not on file  Intimate Partner Violence: Not on file      PHYSICAL EXAM  Vitals:   04/29/20 0920  BP: (!) 161/93  Pulse: 95  Weight: 210 lb (95.3 kg)  Height: 5\' 11"  (1.803 m)   Body mass index is 29.29 kg/m. Generalized: Well developed, in no acute distress  Chest: Lungs clear to auscultation bilaterally  Neurological examination  Mentation: Alert oriented to time, place, history taking. Follows all commands speech and language fluent Cranial nerve II-XII: Extraocular movements were full, visual field were full on confrontational test Head turning and shoulder shrug  were normal and symmetric. Motor: The motor testing reveals 5 over 5 strength of all 4 extremities. Good symmetric motor tone is noted throughout.  Sensory: Sensory testing is intact to soft touch on all 4 extremities. No evidence of extinction is noted.  Gait and station: Gait is normal.    DIAGNOSTIC DATA (LABS, IMAGING, TESTING) - I reviewed patient records, labs, notes, testing and imaging myself where available.  Lab Results  Component Value Date   WBC 5.3 11/15/2019   HGB 16.0 11/15/2019   HCT 47.4 11/15/2019   MCV 88.8 11/15/2019   PLT 160 11/15/2019      Component Value Date/Time   NA 140 11/15/2019 1557   K 4.2 11/15/2019 1557   CL 104 11/15/2019 1557   CO2 28 11/15/2019 1557   GLUCOSE 103 (H) 11/15/2019 1557   BUN 19 11/15/2019 1557   CREATININE 0.84 11/15/2019 1557   CALCIUM 9.8 11/15/2019 1557   PROT 6.7 11/15/2019 1557   ALBUMIN 4.0 03/02/2019 1009   AST  32 11/15/2019 1557   ALT 18 11/15/2019 1557   ALKPHOS 91 03/02/2019 1009   BILITOT 3.0 (H) 11/15/2019 1557   GFRNONAA 87 11/15/2019 1556   GFRAA 101 11/15/2019 1556   Lab Results  Component Value Date   CHOL 145 11/15/2019   HDL 78 11/15/2019   LDLCALC 53 11/15/2019   TRIG 49 11/15/2019   CHOLHDL 1.9 11/15/2019   Lab Results  Component Value Date   HGBA1C 6.2 (A) 11/15/2019   Lab Results  Component Value Date   [id number] 536 11/15/2019   Lab Results  Component Value Date   TSH 2.29 11/15/2019      ASSESSMENT AND PLAN 70 y.o. year old male  has a past medical history of Abnormal result of cardiovascular function study, unspecified, Atherosclerotic heart disease of native coronary artery without angina pectoris, CAD (coronary artery disease), Coronary angioplasty status, Diabetes mellitus without complication ([place]), Essential (primary) hypertension, Gallstones, Hepatic cirrhosis ([place]), Hepatitis C, HIV infection ([place]), Hyperlipidemia, Hypertension, Internal hemorrhoids, Kidney stones, Mixed hyperlipidemia, Palpitations, Sleep apnea, and Tubular adenoma of colon. here with:  1. Obstructive sleep apnea on CPAP  The patient's CPAP download shows suboptimal compliance but good treatment of his apnea.  He is encouraged to continue using CPAP nightly and greater than 4 hours each night.  He is advised that if his symptoms worsen or he develops new symptoms he should let us know.  He will follow-up in 1 year or sooner if needed  I spent 20 minutes of face-to-face and non-face-to-face time with patient.  This included previsit chart review, lab review, study review, order entry, electronic health record documentation, patient education.  Ward Givens, MSN, NP-C 04/29/2020, 9:45 AM [facility] [address], [place], [place] [address] [phone]

## 2020-05-01 DIAGNOSIS — M545 Low back pain, unspecified: Secondary | ICD-10-CM | POA: Diagnosis not present

## 2020-05-06 DIAGNOSIS — M545 Low back pain, unspecified: Secondary | ICD-10-CM | POA: Diagnosis not present

## 2020-05-08 DIAGNOSIS — M545 Low back pain, unspecified: Secondary | ICD-10-CM | POA: Diagnosis not present

## 2020-05-10 ENCOUNTER — Other Ambulatory Visit: Payer: Self-pay

## 2020-05-10 ENCOUNTER — Other Ambulatory Visit: Payer: Medicare Other

## 2020-05-10 DIAGNOSIS — B20 Human immunodeficiency virus [HIV] disease: Secondary | ICD-10-CM | POA: Diagnosis not present

## 2020-05-13 ENCOUNTER — Encounter: Payer: Self-pay | Admitting: Internal Medicine

## 2020-05-13 DIAGNOSIS — M545 Low back pain, unspecified: Secondary | ICD-10-CM | POA: Diagnosis not present

## 2020-05-13 DIAGNOSIS — E119 Type 2 diabetes mellitus without complications: Secondary | ICD-10-CM | POA: Diagnosis not present

## 2020-05-13 DIAGNOSIS — M5136 Other intervertebral disc degeneration, lumbar region: Secondary | ICD-10-CM | POA: Diagnosis not present

## 2020-05-13 DIAGNOSIS — M419 Scoliosis, unspecified: Secondary | ICD-10-CM | POA: Diagnosis not present

## 2020-05-13 LAB — COMPLETE METABOLIC PANEL WITH GFR
AG Ratio: 1.5 (calc) (ref 1.0–2.5)
ALT: 27 U/L (ref 9–46)
AST: 28 U/L (ref 10–35)
Albumin: 3.9 g/dL (ref 3.6–5.1)
Alkaline phosphatase (APISO): 109 U/L (ref 35–144)
BUN/Creatinine Ratio: 22 (calc) (ref 6–22)
BUN: 15 mg/dL (ref 7–25)
CO2: 29 mmol/L (ref 20–32)
Calcium: 9.5 mg/dL (ref 8.6–10.3)
Chloride: 103 mmol/L (ref 98–110)
Creat: 0.69 mg/dL — ABNORMAL LOW (ref 0.70–1.25)
GFR, Est African American: 112 mL/min/{1.73_m2} (ref >=60)
GFR, Est Non African American: 97 mL/min/{1.73_m2} (ref >=60)
Globulin: 2.6 g/dL (calc) (ref 1.9–3.7)
Glucose, Bld: 246 mg/dL — ABNORMAL HIGH (ref 65–99)
Potassium: 4.1 mmol/L (ref 3.5–5.3)
Sodium: 139 mmol/L (ref 135–146)
Total Bilirubin: 3.1 mg/dL — ABNORMAL HIGH (ref 0.2–1.2)
Total Protein: 6.5 g/dL (ref 6.1–8.1)

## 2020-05-13 LAB — T-HELPER CELLS (CD4) COUNT (NOT AT ARMC)
Absolute CD4: 1357 cells/uL (ref 490–1740)
CD4 T Helper %: 54 % (ref 30–61)
Total lymphocyte count: 2536 cells/uL (ref 850–3900)

## 2020-05-13 LAB — HIV-1 RNA QUANT-NO REFLEX-BLD
HIV 1 RNA Quant: <20 Copies/mL
HIV-1 RNA Quant, Log: <1.3 Log cps/mL

## 2020-05-15 ENCOUNTER — Encounter: Payer: Self-pay | Admitting: Internal Medicine

## 2020-05-15 ENCOUNTER — Other Ambulatory Visit: Payer: Self-pay

## 2020-05-15 ENCOUNTER — Telehealth: Payer: Self-pay | Admitting: Internal Medicine

## 2020-05-15 ENCOUNTER — Ambulatory Visit (INDEPENDENT_AMBULATORY_CARE_PROVIDER_SITE_OTHER): Payer: Medicare Other | Admitting: Internal Medicine

## 2020-05-15 DIAGNOSIS — E1165 Type 2 diabetes mellitus with hyperglycemia: Secondary | ICD-10-CM | POA: Diagnosis not present

## 2020-05-15 DIAGNOSIS — G8929 Other chronic pain: Secondary | ICD-10-CM

## 2020-05-15 DIAGNOSIS — M545 Low back pain, unspecified: Secondary | ICD-10-CM

## 2020-05-15 DIAGNOSIS — I10 Essential (primary) hypertension: Secondary | ICD-10-CM | POA: Diagnosis not present

## 2020-05-15 DIAGNOSIS — E781 Pure hyperglyceridemia: Secondary | ICD-10-CM

## 2020-05-15 DIAGNOSIS — I2510 Atherosclerotic heart disease of native coronary artery without angina pectoris: Secondary | ICD-10-CM

## 2020-05-15 MED ORDER — OXYCODONE HCL 5 MG PO TABS: 5.0000 mg | ORAL_TABLET | Freq: Four times a day (QID) | ORAL | 0 refills | Status: DC | PRN

## 2020-05-15 MED ORDER — AMLODIPINE BESYLATE 2.5 MG PO TABS: ORAL_TABLET | ORAL | 3 refills | Status: DC

## 2020-05-15 MED ORDER — LOSARTAN POTASSIUM-HCTZ 100-25 MG PO TABS
1.0000 | ORAL_TABLET | Freq: Every day | ORAL | 3 refills | Status: DC
Start: 2020-05-15 — End: 2021-06-25

## 2020-05-15 MED ORDER — ATENOLOL 50 MG PO TABS: 50.0000 mg | ORAL_TABLET | Freq: Every day | ORAL | 3 refills | Status: DC

## 2020-05-15 MED ORDER — ROSUVASTATIN CALCIUM 10 MG PO TABS: ORAL_TABLET | ORAL | 3 refills | Status: DC

## 2020-05-15 MED ORDER — CLOPIDOGREL BISULFATE 75 MG PO TABS: ORAL_TABLET | ORAL | 3 refills | Status: DC

## 2020-05-15 MED ORDER — VASCEPA 1 G PO CAPS
2.0000 g | ORAL_CAPSULE | Freq: Two times a day (BID) | ORAL | 3 refills | Status: DC
Start: 2020-05-15 — End: 2021-06-02

## 2020-05-15 MED ORDER — SYNJARDY XR 12.5-1000 MG PO TB24
ORAL_TABLET | ORAL | 3 refills | Status: DC
Start: 2020-05-15 — End: 2021-09-19

## 2020-05-15 NOTE — Patient Instructions (Addendum)
We have sent in the oxycodone refill.   We can decrease the dose of synjardy to 1 pill daily if you want and come back about 3 months after the change to recheck the levels.   Try taking magnesium in the evening to help the foot cramp.

## 2020-05-15 NOTE — Telephone Encounter (Signed)
Patient states the pharmacy called and "they need some kind of okay from the doctor" he wasn't able to tell me which medication or if it needed a PA

## 2020-05-15 NOTE — Progress Notes (Signed)
° °  Subjective:   Patient ID: Tyler Hendricks, male    DOB: 10-02-1950, 70 y.o.   MRN: [id number]  HPI The patient is needing refill on oxycodone medication for his back pain (prescribed in October and he did have a negative experience with being told he had to come for visit for this, does have chronic back pain and is in pain all the time, neuro cannot do much for this currently they are working on it, uses this rarely and #75 has lasted 4-5 months), and follow up diabetes (recent labs with good levels, is not sure if he still needs the medicine, he has been working on weight loss, overall good control, denies new numbness), and blood pressure (taking losartan/hctz and amlodipine, denies side effects, denies chest pains or headaches).   Review of Systems  Constitutional: Positive for fatigue.  HENT: Negative.   Eyes: Negative.   Respiratory: Negative for cough, chest tightness and shortness of breath.   Cardiovascular: Negative for chest pain, palpitations and leg swelling.  Gastrointestinal: Negative for abdominal distention, abdominal pain, constipation, diarrhea, nausea and vomiting.  Musculoskeletal: Positive for arthralgias, back pain and myalgias.  Skin: Negative.   Neurological: Negative.   Psychiatric/Behavioral: Negative.     Objective:  Physical Exam Constitutional:      Appearance: He is well-developed and well-nourished.  HENT:     Head: Normocephalic and atraumatic.  Eyes:     Extraocular Movements: EOM normal.  Cardiovascular:     Rate and Rhythm: Normal rate and regular rhythm.  Pulmonary:     Effort: Pulmonary effort is normal. No respiratory distress.     Breath sounds: Normal breath sounds. No wheezing or rales.  Abdominal:     General: Bowel sounds are normal. There is no distension.     Palpations: Abdomen is soft.     Tenderness: There is no abdominal tenderness. There is no rebound.  Musculoskeletal:        General: Tenderness present. No edema.     Cervical  back: Normal range of motion.  Skin:    General: Skin is warm and dry.  Neurological:     Mental Status: He is alert and oriented to person, place, and time.     Coordination: Coordination normal.  Psychiatric:        Mood and Affect: Mood and affect normal.     Vitals:   05/15/20 0934  BP: 132/70  Pulse: 60  Resp: 18  Temp: 98.6 F (37 C)  TempSrc: Oral  SpO2: 94%  Weight: 213 lb 12.8 oz (97 kg)  Height: 5\' 11"  (1.803 m)    This visit occurred during the SARS-CoV-2 public health emergency.  Safety protocols were in place, including screening questions prior to the visit, additional usage of staff PPE, and extensive cleaning of exam room while observing appropriate contact time as indicated for disinfecting solutions.   Assessment & Plan:

## 2020-05-16 NOTE — Telephone Encounter (Signed)
[facility] called and said that they are needing more information and diagnosis code for oxyCODONE (ROXICODONE) 5 MG immediate release tablet. They were also wanting to know if this is the first time the patient has been prescribed this and if so they can not fill more than 7.

## 2020-05-16 NOTE — Telephone Encounter (Signed)
See below

## 2020-05-16 NOTE — Telephone Encounter (Signed)
   Patient calling for status of prior auth for oxyCODONE (ROXICODONE) 5 MG immediate release tablet

## 2020-05-16 NOTE — Telephone Encounter (Signed)
Patient called and was wondering if oxyCODONE (ROXICODONE) 5 MG immediate release tablet was re sent to [id number] - [place], [place] - [address]

## 2020-05-16 NOTE — Telephone Encounter (Signed)
This is not the first fill of this medication. Back pain is the diagnosis.

## 2020-05-17 NOTE — Assessment & Plan Note (Signed)
Recent labs reviewed and appropriate. Continue losartan/hctz and amlodipine.

## 2020-05-17 NOTE — Telephone Encounter (Signed)
Patient called and is requesting a call back at [phone].

## 2020-05-17 NOTE — Assessment & Plan Note (Signed)
We did discuss his recent HgA1c levels and have recommended decrease in synjardi to 1 pill daily and recheck HgA1c in 3 months but he is not sure about this. He will think about this and let us know.

## 2020-05-17 NOTE — Telephone Encounter (Signed)
Spoke with the patient and he stated that he was going to make the 7 pills last. He stated that his body hurts when its rainy/cold outside. But if the sun is out he is perfectly fine. No other questions or concerns at this time.

## 2020-05-17 NOTE — Assessment & Plan Note (Signed)
Due to his cirrhosis and other medications otc options are limited in dosing. He has done well with oxycodone so will refill. [facility] narcotic database reviewed and appropriate. He is seeing neurosurgery and looking for other more permanent options.

## 2020-05-20 ENCOUNTER — Other Ambulatory Visit: Payer: Self-pay | Admitting: Internal Medicine

## 2020-05-20 DIAGNOSIS — I2510 Atherosclerotic heart disease of native coronary artery without angina pectoris: Secondary | ICD-10-CM

## 2020-05-20 DIAGNOSIS — E781 Pure hyperglyceridemia: Secondary | ICD-10-CM

## 2020-05-23 ENCOUNTER — Telehealth: Payer: Self-pay | Admitting: Internal Medicine

## 2020-05-23 ENCOUNTER — Other Ambulatory Visit: Payer: Self-pay | Admitting: Internal Medicine

## 2020-05-23 MED ORDER — OXYCODONE HCL 5 MG PO TABS: 5.0000 mg | ORAL_TABLET | Freq: Four times a day (QID) | ORAL | 0 refills | Status: DC | PRN

## 2020-05-23 MED FILL — BIKTARVY 50-200-25 MG TABS: 50-200-25 | 30 days supply | Qty: 30 | Fill #5

## 2020-05-23 MED FILL — oxyCODONE HCL 5 MG TABS: 5 | 18 days supply | Qty: 75 | Fill #0

## 2020-05-23 NOTE — Telephone Encounter (Signed)
Refilled

## 2020-05-23 NOTE — Telephone Encounter (Signed)
His insurance would only cover the 7 pills at the time of the prescription being filled.

## 2020-05-23 NOTE — Telephone Encounter (Signed)
1.Medication Requested: oxyCODONE (ROXICODONE) 5 MG immediate release tablet    2. Pharmacy (Name, Street, [place]): [id number] [place], [place] - [address]  3. On Med List: yes   4. Last Visit with PCP: 2.23.22  5. Next visit date with PCP: 8.23.22  Patient said that it was a 7 day supply    Agent: Please be advised that RX refills may take up to 3 business days. We ask that you follow-up with your pharmacy.

## 2020-05-24 ENCOUNTER — Other Ambulatory Visit: Payer: Self-pay

## 2020-05-24 ENCOUNTER — Ambulatory Visit (INDEPENDENT_AMBULATORY_CARE_PROVIDER_SITE_OTHER): Payer: Medicare Other | Admitting: Family

## 2020-05-24 ENCOUNTER — Encounter: Payer: Self-pay | Admitting: Family

## 2020-05-24 VITALS — BP 155/93 | HR 88 | Wt 215.6 lb

## 2020-05-24 DIAGNOSIS — I2510 Atherosclerotic heart disease of native coronary artery without angina pectoris: Secondary | ICD-10-CM | POA: Diagnosis not present

## 2020-05-24 DIAGNOSIS — B20 Human immunodeficiency virus [HIV] disease: Secondary | ICD-10-CM

## 2020-05-24 DIAGNOSIS — E1165 Type 2 diabetes mellitus with hyperglycemia: Secondary | ICD-10-CM

## 2020-05-24 DIAGNOSIS — Z0000 Encounter for general adult medical examination without abnormal findings: Secondary | ICD-10-CM

## 2020-05-24 DIAGNOSIS — Z79899 Other long term (current) drug therapy: Secondary | ICD-10-CM

## 2020-05-24 NOTE — Progress Notes (Signed)
Subjective:    Patient ID: Tyler Hendricks, male    DOB: 1950-10-30, 70 y.o.   MRN: [id number]  Chief Complaint  Patient presents with  . Follow-up    No complaints; declined condoms      HPI:  Tyler Hendricks is a 70 y.o. male with HIV disease last seen on 12/08/2019 with well-controlled virus and good adherence and tolerance to his ART regimen of Biktarvy.  Viral load at the time was undetectable with CD4 count of 789.  Most recent blood work completed on 05/10/2020 with viral load that remains undetectable and CD4 count of of 1357.  Kidney function, liver function, electrolytes within normal ranges.  Blood glucose elevated to 246.  Here today for routine follow-up.  [name] continues to take his Biktarvy daily as prescribed with no adverse side effects or missed doses since his last office visit.  Overall feeling well today with no new concerns/complaints. Denies fevers, chills, night sweats, headaches, changes in vision, neck pain/stiffness, nausea, diarrhea, vomiting, lesions or rashes.  [name] has no problems obtaining his medication from the pharmacy and remains covered through Medicare.  Denies feelings of being down, depressed, or hopeless recently.  No recreational or illicit drug use or tobacco use.  Drinks alcohol socially with the occasional beer.  Declines condoms.   Allergies  Allergen Reactions  . Lopressor [Metoprolol Tartrate] Rash and Other (See Comments)    Bleeding (non-specific)   . Integrilin [Eptifibatide] Other (See Comments)    Bleeding (non-specific)      Outpatient Medications Prior to Visit  Medication Sig Dispense Refill  . amLODipine (NORVASC) 2.5 MG tablet TAKE 1 TABLET(2.5 MG) BY MOUTH DAILY 90 tablet 3  . atenolol (TENORMIN) 50 MG tablet Take 1 tablet (50 mg total) by mouth daily. 90 tablet 3  . benzonatate (TESSALON) 200 MG capsule TAKE 1 CAPSULE(200 MG) BY MOUTH THREE TIMES DAILY AS NEEDED FOR COUGH 90 capsule 3  .  bictegravir-emtricitabine-tenofovir AF (BIKTARVY) 50-200-25 MG TABS tablet Take 1 tablet by mouth daily. 30 tablet 11  . clopidogrel (PLAVIX) 75 MG tablet TAKE 1 TABLET(75 MG) BY MOUTH DAILY 90 tablet 3  . FLOVENT HFA 110 MCG/ACT inhaler INHALE 2 PUFFS INTO THE LUNGS TWICE DAILY 12 g 3  . HYDROcodone-homatropine (HYCODAN) 5-1.5 MG/5ML syrup Take 5 mLs by mouth every 6 (six) hours as needed for cough. 240 mL 0  . losartan-hydrochlorothiazide (HYZAAR) 100-25 MG tablet Take 1 tablet by mouth daily. 90 tablet 3  . Multiple Vitamin (MULTIVITAMIN) tablet Take 1 tablet by mouth daily.    Marland Kitchen oxyCODONE (ROXICODONE) 5 MG immediate release tablet Take 1 tablet (5 mg total) by mouth every 6 (six) hours as needed for severe pain. 75 tablet 0  . rosuvastatin (CRESTOR) 10 MG tablet TAKE 1 TABLET(10 MG) BY MOUTH DAILY 90 tablet 3  . SYNJARDY XR 12.07-998 MG TB24 TK 2 TS PO QD 180 tablet 3  . VASCEPA 1 g capsule Take 2 capsules (2 g total) by mouth 2 (two) times daily. 360 capsule 3   No facility-administered medications prior to visit.     Past Medical History:  Diagnosis Date  . Abnormal result of cardiovascular function study, unspecified   . Atherosclerotic heart disease of native coronary artery without angina pectoris   . CAD (coronary artery disease)   . Coronary angioplasty status   . Diabetes mellitus without complication ([place])   . Essential (primary) hypertension   . Gallstones   . Hepatic cirrhosis ([place])   .  Hepatitis C   . HIV infection ([place])   . Hyperlipidemia   . Hypertension   . Internal hemorrhoids   . Kidney stones   . Mixed hyperlipidemia   . Palpitations   . Sleep apnea    CPAP  . Tubular adenoma of colon      Past Surgical History:  Procedure Laterality Date  . CARDIAC CATHETERIZATION Left 04/2013  . chlecystectomy    . COLONOSCOPY  05/02/2013  . PERCUTANEOUS CORONARY STENT INTERVENTION (PCI-S)    . POLYPECTOMY    . UMBILICAL HERNIA REPAIR         Review of  Systems  Constitutional: Negative for appetite change, chills, fatigue, fever and unexpected weight change.  Eyes: Negative for visual disturbance.  Respiratory: Negative for cough, chest tightness, shortness of breath and wheezing.   Cardiovascular: Negative for chest pain and leg swelling.  Gastrointestinal: Negative for abdominal pain, constipation, diarrhea, nausea and vomiting.  Genitourinary: Negative for dysuria, flank pain, frequency, genital sores, hematuria and urgency.  Skin: Negative for rash.  Allergic/Immunologic: Negative for immunocompromised state.  Neurological: Negative for dizziness and headaches.      Objective:    BP (!) 155/93   Pulse 88   Wt 215 lb 9.6 oz (97.8 kg)   BMI 30.07 kg/m  Nursing note and vital signs reviewed.  Physical Exam Constitutional:      General: He is not in acute distress.    Appearance: He is well-developed.  HENT:     Mouth/Throat:     Mouth: Oropharynx is clear and moist.  Eyes:     Conjunctiva/sclera: Conjunctivae normal.  Cardiovascular:     Rate and Rhythm: Normal rate and regular rhythm.     Pulses: Intact distal pulses.     Heart sounds: Normal heart sounds. No murmur heard. No friction rub. No gallop.   Pulmonary:     Effort: Pulmonary effort is normal. No respiratory distress.     Breath sounds: Normal breath sounds. No wheezing or rales.  Chest:     Chest wall: No tenderness.  Abdominal:     General: Bowel sounds are normal.     Palpations: Abdomen is soft.     Tenderness: There is no abdominal tenderness.  Musculoskeletal:     Cervical back: Neck supple.  Lymphadenopathy:     Cervical: No cervical adenopathy.  Skin:    General: Skin is warm and dry.     Findings: No rash.  Neurological:     Mental Status: He is alert and oriented to person, place, and time.  Psychiatric:        Mood and Affect: Mood and affect normal.        Behavior: Behavior normal.        Thought Content: Thought content normal.         Judgment: Judgment normal.      Depression screen [facility] 2/9 05/24/2020 12/08/2019 11/23/2018 10/04/2018 04/15/2017  Decreased Interest 0 0 0 0 0  Down, Depressed, Hopeless 0 0 0 0 0  PHQ - 2 Score 0 0 0 0 0       Assessment & Plan:    Patient Active Problem List   Diagnosis Date Noted  . Neural foraminal stenosis of cervical spine 01/03/2020  . Chronic chest wall pain 11/16/2019  . Routine general medical examination at a health care facility 11/16/2019  . Hyperlipidemia LDL goal <70 11/15/2019  . Benign prostatic hyperplasia without lower urinary tract symptoms 11/15/2019  . Interstitial  pulmonary disease ([place]) 07/18/2019  . Chronic bilateral low back pain without sciatica 03/02/2019  . Muscle cramps 03/02/2019  . Cirrhosis of liver without ascites ([place]) 01/08/2019  . Secondary esophageal varices without bleeding ([place]) 01/08/2019  . Intestinal metaplasia of gastric mucosa 01/08/2019  . Hepatitis B core antibody positive 01/08/2019  . Chronic cough 06/24/2018  . Healthcare maintenance 05/11/2018  . Hx of colonic polyp 04/27/2018  . Change in bowel habit 04/27/2018  . Ventral hernia without obstruction or gangrene 04/27/2018  . Thrombocytopenia ([place]) 04/27/2018  . Hypersplenism 04/27/2018  . Positive hepatitis C antibody test 04/27/2018  . Coronary artery disease without angina pectoris 02/25/2018  . Obstructive sleep apnea 10/25/2017  . Extrinsic asthma without complication 64/40/3474  . Vitamin D deficiency 08/25/2017  . HIV disease ([place]) 04/15/2017  . Primary osteoarthritis of both knees 04/15/2017  . Type 2 diabetes mellitus with hyperglycemia, without long-term current use of insulin ([place]) 04/15/2017  . Essential hypertension 04/15/2017     Problem List Items Addressed This Visit      Endocrine   Type 2 diabetes mellitus with hyperglycemia, without long-term current use of insulin ([place])   Relevant Orders   HgB A1c     Other   HIV disease ([place]) - Primary    Mr.  Roche continues have well-controlled HIV disease with good adherence and tolerance to his ART regimen of Biktarvy.  No signs/symptoms of opportunistic infection or progressive HIV disease.  Reviewed lab work and discussed plan of care.  Continue current dose of Biktarvy.  Plan for follow-up in 6 months or sooner if needed with lab work 1 to 2 weeks prior to appointment.      Relevant Orders   HIV-1 RNA quant-no reflex-bld   T-helper cell (CD4)- ([facility] clinic only)   Comprehensive metabolic panel   Healthcare maintenance     Discussed importance of safe sexual practice reduce risk of STI.  Condoms declined.  Continue with routine dental care.        Other Visit Diagnoses    Pharmacologic therapy       Relevant Orders   Lipid panel       I am having Georges Lynch maintain his multivitamin, Flovent HFA, Biktarvy, benzonatate, HYDROcodone-homatropine, amLODipine, atenolol, clopidogrel, losartan-hydrochlorothiazide, rosuvastatin, Vascepa, Synjardy XR, and oxyCODONE.    Follow-up: Return in about 6 months (around 11/24/2020), or if symptoms worsen or fail to improve.   [name], MSN, FNP-C Nurse Practitioner [facility] for Infectious Disease [place] number: [phone]

## 2020-05-24 NOTE — Patient Instructions (Signed)
Nice to see you.  We will continue your Biktarvy daily.  Refills are available at the pharmacy.  Plan for follow up in 6 months or sooner if needed.   Have a great day and stay safe!

## 2020-05-24 NOTE — Assessment & Plan Note (Signed)
   Discussed importance of safe sexual practice reduce risk of STI.  Condoms declined.  Continue with routine dental care.

## 2020-05-24 NOTE — Assessment & Plan Note (Signed)
[name] continues have well-controlled HIV disease with good adherence and tolerance to his ART regimen of Biktarvy.  No signs/symptoms of opportunistic infection or progressive HIV disease.  Reviewed lab work and discussed plan of care.  Continue current dose of Biktarvy.  Plan for follow-up in 6 months or sooner if needed with lab work 1 to 2 weeks prior to appointment.

## 2020-06-04 ENCOUNTER — Telehealth: Payer: Self-pay | Admitting: Pulmonary Disease

## 2020-06-04 NOTE — Telephone Encounter (Signed)
Called and spoke with patient. He stated that his cough has not changed since his last visit and he was just asking for a refill on his Hycodan cough syrup. He is not having any new symptoms, SOB has not changed. [facility] [address]   Dr. Vaughan Browner, please advise if you are ok with this refill. Thanks!

## 2020-06-05 DIAGNOSIS — M545 Low back pain, unspecified: Secondary | ICD-10-CM | POA: Diagnosis not present

## 2020-06-05 DIAGNOSIS — M5459 Other low back pain: Secondary | ICD-10-CM | POA: Diagnosis not present

## 2020-06-05 MED ORDER — HYDROCODONE-HOMATROPINE 5-1.5 MG/5ML PO SYRP: 5.0000 mL | ORAL_SOLUTION | Freq: Four times a day (QID) | ORAL | 0 refills | Status: DC | PRN

## 2020-06-05 NOTE — Telephone Encounter (Signed)
pt calling to get Rx for Hycodan sent to [facility].. pt states that [facility] [address] and they said that they didnt have it, pt was made aware that rx was sent over less than 30 mins ago,  still insisted it get sent to [facility].  please advise [phone]

## 2020-06-05 NOTE — Telephone Encounter (Signed)
Ok renewed.

## 2020-06-05 NOTE — Telephone Encounter (Signed)
Pt returning a phone about the prescription refill. Pt can be reached at [phone]

## 2020-06-05 NOTE — Telephone Encounter (Signed)
I have added Product/process development scientist on [address] to [employer] of pharmacies. Dr. Vaughan Browner, please see message about pt calling back needing Hycodan to go to this pharmacy and advise.

## 2020-06-05 NOTE — Telephone Encounter (Signed)
Patient is aware that the cough syrup has been sent in for him. He verbalized understanding. Nothing further needed at time of call.

## 2020-06-05 NOTE — Telephone Encounter (Signed)
OK. It has been resent to [facility] at [place]

## 2020-06-11 DIAGNOSIS — Z79891 Long term (current) use of opiate analgesic: Secondary | ICD-10-CM | POA: Diagnosis not present

## 2020-06-11 DIAGNOSIS — D6869 Other thrombophilia: Secondary | ICD-10-CM | POA: Diagnosis not present

## 2020-06-11 DIAGNOSIS — M5416 Radiculopathy, lumbar region: Secondary | ICD-10-CM | POA: Diagnosis not present

## 2020-06-11 DIAGNOSIS — M48062 Spinal stenosis, lumbar region with neurogenic claudication: Secondary | ICD-10-CM | POA: Diagnosis not present

## 2020-06-13 ENCOUNTER — Telehealth: Payer: Self-pay | Admitting: Pulmonary Disease

## 2020-06-13 NOTE — Telephone Encounter (Signed)
Called and spoke to pt. Pt states the Hycodan script that was sent to the [facility] on 06/05/20 was out of stock and he did not receive the medication. Pt states he tried to get it once it was in stock but the pharmacy wants a new script because it has been several days. I spoke with the pharmacist at [facility] and she did verify they were out of stock and they never filled the medication and the script on file has been canceled. Pt now requesting a new [facility] script [facility]    Dr. Vaughan Browner, please advise if ok to send another Hycodan script in for pt

## 2020-06-13 NOTE — Telephone Encounter (Signed)
That is fine. Do I need to sign for the paper perscription or can I send it by Epic? I am away from clinic for the next week

## 2020-06-14 NOTE — Telephone Encounter (Signed)
Called and spoke with pt letting him know that Dr. Vaughan Browner is working on getting Hycodan Rx [facility] [address] for him and stated to pt as soon as this has been taken care of by Dr. Vaughan Browner we would let him know. Pt verbalized understanding. Will leave encounter open for update from Dr. Vaughan Browner once this has been sent for pt.

## 2020-06-14 NOTE — Telephone Encounter (Signed)
I tried to send the new prescription via Epic to [facility] at [address] but unfortunately I am locked out of the [place]. I am working with IT to resolve this and cannot sign the prescription till then

## 2020-06-17 MED ORDER — HYDROCODONE-HOMATROPINE 5-1.5 MG/5ML PO SYRP: 5.0000 mL | ORAL_SOLUTION | Freq: Four times a day (QID) | ORAL | 0 refills | Status: DC | PRN

## 2020-06-17 NOTE — Telephone Encounter (Signed)
ATC patient to let him know that RX has been sent to pharmacy, per DPR left detailed message with that info. Advised patient to call with any questions or concerns. Nothing further needed at this time.

## 2020-06-17 NOTE — Telephone Encounter (Signed)
OK. I was able to get my log in fixed and send the prescription in to [facility] at [address]

## 2020-06-20 ENCOUNTER — Other Ambulatory Visit (HOSPITAL_COMMUNITY): Payer: Self-pay

## 2020-06-22 ENCOUNTER — Other Ambulatory Visit (HOSPITAL_COMMUNITY): Payer: Self-pay

## 2020-06-22 MED FILL — Bictegravir-Emtricitabine-Tenofovir AF Tab 50-200-25 MG: ORAL | 30 days supply | Qty: 30 | Fill #0 | Status: AC

## 2020-06-23 ENCOUNTER — Other Ambulatory Visit (HOSPITAL_COMMUNITY): Payer: Self-pay

## 2020-06-24 ENCOUNTER — Ambulatory Visit
Admission: RE | Admit: 2020-06-24 | Discharge: 2020-06-24 | Disposition: A | Discharge status: Home / Self Care | Payer: Medicare Other | Source: Ambulatory Visit | Attending: Pulmonary Disease | Admitting: Pulmonary Disease

## 2020-06-24 DIAGNOSIS — J849 Interstitial pulmonary disease, unspecified: Secondary | ICD-10-CM

## 2020-06-24 DIAGNOSIS — J479 Bronchiectasis, uncomplicated: Secondary | ICD-10-CM | POA: Diagnosis not present

## 2020-06-24 DIAGNOSIS — J84112 Idiopathic pulmonary fibrosis: Secondary | ICD-10-CM | POA: Diagnosis not present

## 2020-06-24 DIAGNOSIS — I2510 Atherosclerotic heart disease of native coronary artery without angina pectoris: Secondary | ICD-10-CM | POA: Diagnosis not present

## 2020-06-24 DIAGNOSIS — I358 Other nonrheumatic aortic valve disorders: Secondary | ICD-10-CM | POA: Diagnosis not present

## 2020-06-27 DIAGNOSIS — D1801 Hemangioma of skin and subcutaneous tissue: Secondary | ICD-10-CM | POA: Diagnosis not present

## 2020-06-27 DIAGNOSIS — L821 Other seborrheic keratosis: Secondary | ICD-10-CM | POA: Diagnosis not present

## 2020-06-27 DIAGNOSIS — D229 Melanocytic nevi, unspecified: Secondary | ICD-10-CM | POA: Diagnosis not present

## 2020-06-27 DIAGNOSIS — L819 Disorder of pigmentation, unspecified: Secondary | ICD-10-CM | POA: Diagnosis not present

## 2020-06-27 DIAGNOSIS — L570 Actinic keratosis: Secondary | ICD-10-CM | POA: Diagnosis not present

## 2020-06-27 DIAGNOSIS — L814 Other melanin hyperpigmentation: Secondary | ICD-10-CM | POA: Diagnosis not present

## 2020-06-28 ENCOUNTER — Ambulatory Visit (INDEPENDENT_AMBULATORY_CARE_PROVIDER_SITE_OTHER): Payer: Medicare Other | Admitting: Pulmonary Disease

## 2020-06-28 ENCOUNTER — Other Ambulatory Visit: Payer: Self-pay

## 2020-06-28 ENCOUNTER — Encounter: Payer: Self-pay | Admitting: Pulmonary Disease

## 2020-06-28 VITALS — BP 136/70 | HR 69 | Temp 97.7°F | Ht 72.0 in | Wt 214.0 lb

## 2020-06-28 DIAGNOSIS — J84112 Idiopathic pulmonary fibrosis: Secondary | ICD-10-CM | POA: Diagnosis not present

## 2020-06-28 DIAGNOSIS — J849 Interstitial pulmonary disease, unspecified: Secondary | ICD-10-CM

## 2020-06-28 DIAGNOSIS — R053 Chronic cough: Secondary | ICD-10-CM | POA: Diagnosis not present

## 2020-06-28 DIAGNOSIS — I2510 Atherosclerotic heart disease of native coronary artery without angina pectoris: Secondary | ICD-10-CM | POA: Diagnosis not present

## 2020-06-28 DIAGNOSIS — G4733 Obstructive sleep apnea (adult) (pediatric): Secondary | ICD-10-CM | POA: Diagnosis not present

## 2020-06-28 LAB — PULMONARY FUNCTION TEST
DL/VA % pred: 88 %
DL/VA: 3.58 ml/min/mmHg/L
DLCO cor % pred: 69 %
DLCO cor: 19.32 ml/min/mmHg
DLCO unc % pred: 69 %
DLCO unc: 19.32 ml/min/mmHg
FEF 25-75 Post: 3.6 L/sec
FEF 25-75 Pre: 2.72 L/sec
FEF2575-%Change-Post: 32 %
FEF2575-%Pred-Post: 132 %
FEF2575-%Pred-Pre: 100 %
FEV1-%Change-Post: 3 %
FEV1-%Pred-Post: 85 %
FEV1-%Pred-Pre: 83 %
FEV1-Post: 3.05 L
FEV1-Pre: 2.95 L
FEV1FVC-%Change-Post: 3 %
FEV1FVC-%Pred-Pre: 108 %
FEV6-%Change-Post: 0 %
FEV6-%Pred-Post: 81 %
FEV6-%Pred-Pre: 80 %
FEV6-Post: 3.69 L
FEV6-Pre: 3.68 L
FEV6FVC-%Change-Post: 0 %
FEV6FVC-%Pred-Post: 105 %
FEV6FVC-%Pred-Pre: 105 %
FVC-%Change-Post: 0 %
FVC-%Pred-Post: 77 %
FVC-%Pred-Pre: 76 %
FVC-Post: 3.7 L
FVC-Pre: 3.7 L
Post FEV1/FVC ratio: 82 %
Post FEV6/FVC ratio: 100 %
Pre FEV1/FVC ratio: 80 %
Pre FEV6/FVC Ratio: 100 %
RV % pred: 79 %
RV: 2.01 L
TLC % pred: 77 %
TLC: 5.76 L

## 2020-06-28 NOTE — Progress Notes (Signed)
Terez Freimark    [id number]    07/05/1950  Primary Care Physician:Crawford, Real Cons, MD  Referring Physician: Hoyt Koch, MD [address] [place],  [place] [address]  Chief complaint: Follow-up for pulmonary fibrosis  HPI: 70 year old with history of coronary artery disease, HIV, hepatitis C, cirrhosis Complains of chronic cough for the past several years. He has paroxysms of cough associated with dyspnea. Mild chest congestion History notable for COVID-19 infection in December 2020. He had very mild symptoms and did not require hospitalization.  CT scan with probable UIP pattern pulmonary fibrosis. He has been referred to the ILD clinic for further evaluation  Pets: Has a dog. No cats, birds, farm animals Occupation: Retired Development worker, community Exposures: No known exposures. No mold, hot tub, Jacuzzi. No down pillows or comforters ILD questionnaire 10/03/2019-negative Smoking history: 60-pack-year smoker. Quit in late 1990s Travel history: Originally from [place]. Moved to [place] in 2018 Relevant family history: Mother had emphysema. She was a heavy smoker.  Outpatient Encounter Medications as of 06/28/2020  Medication Sig  . amLODipine (NORVASC) 2.5 MG tablet TAKE 1 TABLET(2.5 MG) BY MOUTH DAILY  . atenolol (TENORMIN) 50 MG tablet Take 1 tablet (50 mg total) by mouth daily.  . benzonatate (TESSALON) 200 MG capsule TAKE 1 CAPSULE(200 MG) BY MOUTH THREE TIMES DAILY AS NEEDED FOR COUGH  . bictegravir-emtricitabine-tenofovir AF (BIKTARVY) 50-200-25 MG TABS tablet TAKE 1 TABLET BY MOUTH DAILY.  Marland Kitchen clopidogrel (PLAVIX) 75 MG tablet TAKE 1 TABLET(75 MG) BY MOUTH DAILY  . FLOVENT HFA 110 MCG/ACT inhaler INHALE 2 PUFFS INTO THE LUNGS TWICE DAILY  . HYDROcodone-homatropine (HYCODAN) 5-1.5 MG/5ML syrup Take 5 mLs by mouth every 6 (six) hours as needed for cough.  . losartan-hydrochlorothiazide (HYZAAR) 100-25 MG tablet Take 1 tablet by  mouth daily.  . Multiple Vitamin (MULTIVITAMIN) tablet Take 1 tablet by mouth daily.  Marland Kitchen oxyCODONE (OXY IR/ROXICODONE) 5 MG immediate release tablet TAKE 1 TABLET BY MOUTH EVERY 6 HOURS AS NEEDED FOR SEVERE PAIN  . rosuvastatin (CRESTOR) 10 MG tablet TAKE 1 TABLET(10 MG) BY MOUTH DAILY  . SYNJARDY XR 12.07-998 MG TB24 TK 2 TS PO QD  . VASCEPA 1 g capsule Take 2 capsules (2 g total) by mouth 2 (two) times daily.   No facility-administered encounter medications on file as of 06/28/2020.    Physical Exam: Blood pressure 132/70, pulse 66, temperature (!) 97 F (36.1 C), temperature source Skin, height 5\' 11"  (1.803 m), weight 212 lb 12.8 oz (96.5 kg), SpO2 94 %. Gen:      No acute distress HEENT:  EOMI, sclera anicteric Neck:     No masses; no thyromegaly Lungs:    Clear to auscultation bilaterally; normal respiratory effort CV:         Regular rate and rhythm; no murmurs Abd:      + bowel sounds; soft, non-tender; no palpable masses, no distension Ext:    No edema; adequate peripheral perfusion Skin:      Warm and dry; no rash Neuro: alert and oriented x 3 Psych: normal mood and affect  Data Reviewed: Imaging: High-res CT 07/13/2019-groundglass attenuation with septal thickening, cylindrical bronchiectasis with mild basal gradient Probable UIP pattern High-res CT 06/24/2020-stable pulmonary fibrosis and probable UIP pattern I have reviewed the images personally  PFTs: 09/01/2019 FVC 3.78 [78%], FEV1 3.14 [87%], F/F 83, TLC 6.40 [86%], DLCO 22.76 [81%] Normal test  06/28/2020 FVC 3.70 [71%], FEV1  3.05 [85%], F/F 82, TLC 5.76 [79%], DLCO 19.32 [69%] Minimal restriction, mild diffusion defect  Labs: CTD serologies 08/30/2019-positive for ANA 1:40, nuclear, speckled  Sleep: CPAP download 11/25/2019-2% compliance  Assessment:  Pulmonary fibrosis, IPF Has probable UIP pattern on imaging There is no symptoms of connective tissue disease. Borderline positive ANA is likely nonspecific He did  have Covid in December 2020 but it was a very mild case with no hospitalization, besides his symptoms preceded the Covid infection  He likely has IPF based on CT appearance in male smoker. Discussed further work-up such as bronchoscopy, lung biopsy but he wants to avoid invasive procedures Reviewed treatment with antifibrotics. We will have to be cautious with these given history of cirrhosis  I have reviewed his latest CT and PFTs with him today in clinic.  Although CT is stable PFTs continue to show decline consistent with IPF  We had multiple discussions including a long discussion today about antifibrotics.  I have recommended that he start Ofev at a lower dose of 100 mg twice daily but he is not ready to make a commitment yet and will call us if he changes his mind  Return to clinic in 6 months  Severe OSA Noncompliant with CPAP.  Encouraged him to use it daily.  Health maintenance Up-to-date with Covid booster and flu vaccine  Plan/Recommendations: Ongoing discussions about treatment for pulmonary fibrosis  Follow-up in 6 months  Marshell Garfinkel MD [facility] 06/28/2020, 10:07 AM  CC: Hoyt Koch, *

## 2020-06-28 NOTE — Progress Notes (Signed)
PFT done today. 

## 2020-06-28 NOTE — Patient Instructions (Addendum)
You are here for follow-up of scarring in the lung Although the CT scan is stable your lung function test shows that you lost about 10% of the lung capacity in 10 months  I would recommend that he start treatment with a medication called Ofev which will slow down the progression of scarring Per your preference we have held off initiating treatment until you discuss with your wife Please let us know if you want [place] to go ahead with the paperwork for the medication  Follow-up in 6 months

## 2020-07-01 ENCOUNTER — Telehealth: Payer: Self-pay | Admitting: Pulmonary Disease

## 2020-07-01 NOTE — Telephone Encounter (Signed)
Called and spoke with patient's wife Santiago Glad. She stated that the patient was told by Lattie Haw to call the office back to provide his financial information. Per patient's pharmacy, he signed the documents during his OV on 06/28/20. She provided me with the financial information over the phone. As requested, will pass this information along to [facility] via staff message to avoid having it listed in his chart.   Staff message has been sent to [facility].

## 2020-07-03 NOTE — Telephone Encounter (Signed)
Spoke with Lattie Haw yesterday. She has documented the information onto patient's application. Nothing further needed, will close encounter.

## 2020-07-10 ENCOUNTER — Telehealth: Payer: Self-pay | Admitting: Pharmacy Technician

## 2020-07-10 ENCOUNTER — Telehealth: Payer: Self-pay | Admitting: Pulmonary Disease

## 2020-07-10 NOTE — Telephone Encounter (Signed)
Submitted a Prior Authorization request to [facility] for OFEV via CoverMyMeds. Will update once we receive a response.   Key: [id number]

## 2020-07-10 NOTE — Telephone Encounter (Signed)
Received New start paperwork for OFEV 100mg . Will update as we work through the benefits process.   Dx code- G38.756- idiopathic pulmonary fibrosis

## 2020-07-10 NOTE — Telephone Encounter (Signed)
Called and spoke to pt. Pt is requesting a refill of Hycodan.  Last filled on 06/17/20 for 288ml with 0 refills. Last seen on 06/28/2020 by Dr. Vaughan Browner.  Pt states his cough is unchanged.   Dr. Vaughan Browner, please advise. Thanks.

## 2020-07-11 ENCOUNTER — Other Ambulatory Visit (HOSPITAL_COMMUNITY): Payer: Self-pay

## 2020-07-11 MED ORDER — HYDROCODONE-HOMATROPINE 5-1.5 MG/5ML PO SYRP
5.0000 mL | ORAL_SOLUTION | Freq: Four times a day (QID) | ORAL | 0 refills | Status: DC | PRN
  Filled 2020-07-11: qty 240, 12d supply, fill #0

## 2020-07-11 NOTE — Telephone Encounter (Signed)
Ok. Done 

## 2020-07-11 NOTE — Telephone Encounter (Signed)
Submitted Patient Assistance Application to [employer] for OFEV along with provider portion and income documents. Will update patient when we receive a response.  Fax# [phone] Phone# [phone]  

## 2020-07-11 NOTE — Telephone Encounter (Signed)
Called and spoke to patient. He will speak with wife to get Social security statements to submit with patient assistance application. Advised to call clinic with any questions.

## 2020-07-11 NOTE — Telephone Encounter (Signed)
Patient already has an active grant through [employer], patient is not able to have a second grant through them. Will reach out to patient for income documents for [facility] Cares patient Assistance application.

## 2020-07-11 NOTE — Telephone Encounter (Signed)
I have called the pt and he is aware of rx for the hycodan has been sent to the pharmacy. Nothing further is needed.

## 2020-07-12 ENCOUNTER — Other Ambulatory Visit (HOSPITAL_COMMUNITY): Payer: Self-pay

## 2020-07-18 NOTE — Telephone Encounter (Signed)
Wife faxed W2 for patient. Faxed to [employer]. Will update once we receive a response.

## 2020-07-18 NOTE — Telephone Encounter (Signed)
Received notification from [facility] regarding a prior authorization for [place]. Authorization has been APPROVED from 04/11/20 to 07/10/21.   Authorization # [id number]  Called [employer] to check status of application. Rep advised patient's case is still pending due to they want to clarify the income. What was written on the application does not match the income documents that were submitted.  Will reach out to patient/wife to clarify later today.

## 2020-07-18 NOTE — Telephone Encounter (Signed)
Spoke to wife about additional income documents, she will fax to Pharmacy team's fax. Once received we will fax to [facility] and follow up to ensure it gets processed.

## 2020-07-19 ENCOUNTER — Other Ambulatory Visit (HOSPITAL_COMMUNITY): Payer: Self-pay

## 2020-07-19 MED FILL — Bictegravir-Emtricitabine-Tenofovir AF Tab 50-200-25 MG: ORAL | 30 days supply | Qty: 30 | Fill #1 | Status: AC

## 2020-07-19 NOTE — Telephone Encounter (Signed)
Called [employer], it takes 24-48 hours to process submitted documents. Will follow up on Monday.  Phone# [phone]

## 2020-07-22 NOTE — Telephone Encounter (Signed)
Spoke with Estill Bamberg at [facility], pt has been APPROVED for assistance and will just need to return the phone call to [facility] in order to finish getting everything set up. Approval letter is being re-faxed to clinic and has also been mailed to the pt.  [employer] phone# [phone]

## 2020-07-24 ENCOUNTER — Other Ambulatory Visit (HOSPITAL_COMMUNITY): Payer: Self-pay

## 2020-07-30 ENCOUNTER — Telehealth: Payer: Self-pay | Admitting: Pharmacist

## 2020-07-30 DIAGNOSIS — Z5181 Encounter for therapeutic drug level monitoring: Secondary | ICD-10-CM

## 2020-07-30 NOTE — Telephone Encounter (Addendum)
Subjective:  Patient and his wife, Santiago Glad, are called today by [facility] Pulmonary pharmacy team for Ofev new start counseling.   Patient was last seen on 06/28/20 by Dr. Vaughan Browner - at that visit, patient stated that he was still unsure of moving forward with starting Ofev. Pertinent past medical history includes: IPF, hx of cirrhosis, CAD, HTN, HIV, Hepatitis C, asthma without complications, OSA, S2AJ, and hyperlipidemia. He was diagnosed with COVID in December 2020 not requiring hospitalization. History of smoking (60-pack-year and quit in late 1990s)  Initially I spoke with patient and we discussed side effects, monitoring, however he requested I speak to his wife for details since she is a Marine scientist and he forgets much of the information that is communicated at provider visits. He stated concern about side effect profile. Raed Schalk describes in detail that Mr. Brau can be "strong-willed" about his medical care and may stop medications without a full trial d/t side effects.  History of CAD: yes, 02/25/2018.  History of elevated LFTs: not recently, though he does have history of cirrhosis History of diarrhea, nausea, vomiting: none   Objective: Allergies  Allergen Reactions  . Lopressor [Metoprolol Tartrate] Rash and Other (See Comments)    Bleeding (non-specific)   . Integrilin [Eptifibatide] Other (See Comments)    Bleeding (non-specific)    Outpatient Encounter Medications as of 07/30/2020  Medication Sig  . amLODipine (NORVASC) 2.5 MG tablet TAKE 1 TABLET(2.5 MG) BY MOUTH DAILY  . atenolol (TENORMIN) 50 MG tablet Take 1 tablet (50 mg total) by mouth daily.  . benzonatate (TESSALON) 200 MG capsule TAKE 1 CAPSULE(200 MG) BY MOUTH THREE TIMES DAILY AS NEEDED FOR COUGH  . bictegravir-emtricitabine-tenofovir AF (BIKTARVY) 50-200-25 MG TABS tablet TAKE 1 TABLET BY MOUTH DAILY.  Marland Kitchen clopidogrel (PLAVIX) 75 MG tablet TAKE 1 TABLET(75 MG) BY MOUTH DAILY  . FLOVENT HFA 110 MCG/ACT inhaler INHALE 2 PUFFS  INTO THE LUNGS TWICE DAILY  . HYDROcodone-homatropine (HYCODAN) 5-1.5 MG/5ML syrup Take 5 mLs by mouth every 6 (six) hours as needed for cough.  . losartan-hydrochlorothiazide (HYZAAR) 100-25 MG tablet Take 1 tablet by mouth daily.  . Multiple Vitamin (MULTIVITAMIN) tablet Take 1 tablet by mouth daily.  Marland Kitchen oxyCODONE (OXY IR/ROXICODONE) 5 MG immediate release tablet TAKE 1 TABLET BY MOUTH EVERY 6 HOURS AS NEEDED FOR SEVERE PAIN  . rosuvastatin (CRESTOR) 10 MG tablet TAKE 1 TABLET(10 MG) BY MOUTH DAILY  . SYNJARDY XR 12.07-998 MG TB24 TK 2 TS PO QD  . VASCEPA 1 g capsule Take 2 capsules (2 g total) by mouth 2 (two) times daily.   No facility-administered encounter medications on file as of 07/30/2020.     Immunization History  Administered Date(s) Administered  . Fluad Quad(high Dose 65+) 11/17/2018, 12/08/2019  . Influenza, High Dose Seasonal PF 12/20/2017, 11/17/2018  . PFIZER(Purple Top)SARS-COV-2 Vaccination 05/18/2019, 06/14/2019, 12/22/2019  . Tdap 09/07/2018  . Zoster Recombinat (Shingrix) 01/06/2017      PFT's TLC  Date Value Ref Range Status  06/28/2020 5.76 L Final      CMP     Component Value Date/Time   NA 139 05/10/2020 1009   K 4.1 05/10/2020 1009   CL 103 05/10/2020 1009   CO2 29 05/10/2020 1009   GLUCOSE 246 (H) 05/10/2020 1009   BUN 15 05/10/2020 1009   CREATININE 0.69 (L) 05/10/2020 1009   CALCIUM 9.5 05/10/2020 1009   PROT 6.5 05/10/2020 1009   ALBUMIN 4.0 03/02/2019 1009   AST 28 05/10/2020 1009  ALT 27 05/10/2020 1009   ALKPHOS 91 03/02/2019 1009   BILITOT 3.1 (H) 05/10/2020 1009   GFRNONAA 97 05/10/2020 1009   GFRAA 112 05/10/2020 1009    CBC    Component Value Date/Time   WBC 5.3 11/15/2019 1556   RBC 5.34 11/15/2019 1556   HGB 16.0 11/15/2019 1556   HCT 47.4 11/15/2019 1556   PLT 160 11/15/2019 1556   MCV 88.8 11/15/2019 1556   MCH 30.0 11/15/2019 1556   MCHC 33.8 11/15/2019 1556   RDW 13.5 11/15/2019 1556   LYMPHSABS 1,972 11/15/2019  1556   MONOABS 0.5 09/07/2018 1048   EOSABS 270 11/15/2019 1556   BASOSABS 58 11/15/2019 1556      LFT's Hepatic Function Latest Ref Rng & Units 05/10/2020 11/15/2019 05/23/2019  Total Protein 6.1 - 8.1 g/dL 6.5 6.7 6.6  Albumin 3.5 - 5.2 g/dL - - -  AST 10 - 35 U/L 28 32 28  ALT 9 - 46 U/L _0 Alk Phosphatase 39 - 117 U/L - - -  Total Bilirubin 0.2 - 1.2 mg/dL 3.1(H) 3.0(H) 2.5(H)  Bilirubin, Direct 0.0 - 0.3 mg/dL - - -    HRCT (06/24/20) - mild pulmonary fibrosis - unchanged compared to prior scans. Findings consistent with probably UIP pattern by ATS pulmonary fibrosis criteria.  Assessment and Plan  Patient has history of non-adherence to other medications d/t side effects per wife. Ideally, she should join each visit because she's a good reporter of how he manages his own care. For example, he tells providers that he has tried all avenues to manage side effects when he hasn't. I discussed that the plan is only to start Ofev if he is in agreement and his providers want to make a shared decision with him for his IPF.  His wife and I have planned for her to reply to the MyChart messages I've sent once Ofev is received. If he chooses to start, he will need to make an appointment to be seen by an APP at 1 month after starting. If he would like to discuss goals of care further with Dr. Vaughan Browner, he will need an appointment with Dr. Vaughan Browner in 1-2 months. Wife expressed understanding.  1. Ofev Medication Management Thoroughly counseled patient on the efficacy, mechanism of action, dosing, administration, adverse effects, and monitoring parameters of Ofev.   Goals of therapy: . Will not stop or reverse the progression of ILD. It will slow the progression of ILD.  Marland Kitchen CT chest is gold standard of measuring progression and efficacy of Ofev. Can also assess PFTs  Dose: . Take 100 mg (one capsule) by mouth twice daily (approx 12 hours apart) - low dose d/t history of cirrhosis  Counseling  points: . Take with food, minimize sugar intake, increase protein intake as tolerable . Do not crush or split capsule . May take loperamide which will be sent with Ofev shipment: 44m once, then 2 mg after each loose stool. Max 151mper 24 hrs for max 48 hours. . Discussed reversibility of GI side effects by holding medication until back to baseline . Discussed risk of bleeding especially d/t his cardiac history and need for CBC monitoring.  Referral to [facility] faxed today after discussing with his wife who is interested in this additional support. Discussed that nurse navigator is available 24/7 to answer questions about medication, help manage side effects, etc.  Monitoring: . Monitor for diarrhea, nausea and vomiting, GI perforation, hepatotoxicity  . LFTs wnl  February 2022 . Monitor LFTs (baseline, monthly for first 6 months, then every 3 months thereafter and as clinically indicated). Standing labs placed today for CBC and hepatic function panel. They live in [place] and are able to come to clinic to have labs drawn.  2. Medication Reconciliation A drug regimen assessment was performed, including review of allergies, interactions, disease-state management, dosing and immunization history. Medications were reviewed with the patient, including name, instructions, indication, goals of therapy, potential side effects, importance of adherence, and safe use.  No significant DDIs. Patient is on Vascepa for hypertriglyceridemia which has increased risk of bleeding. Also takes clopidogrel.  3. Immunizations He has received 3 COVID19 vaccines (05/18/19, 06/14/19, 12/22/19) Received Tdap on 09/07/2018 Received Shingrix on 01/06/2017 and is eligible to receive 2-dose Shingles vaccines even if he's received Shingrix. UTD on high-dose influenza vaccine  Thank you for involving pharmacy to assist in providing this patient's care.   Knox Saliva, PharmD, MPH Clinical Pharmacist (Rheumatology and  Pulmonology)

## 2020-07-30 NOTE — Telephone Encounter (Signed)
Counseled patient and wife, Santiago Glad, on Ofev. They have not yet received shipment of Ofev from [facility] but wife has talked to the company and received the letter.  She is interested in [facility] as a resource for her and her husband, so I've faxed [facility] form.  Advised that nurse educator program will reach out to them in the coming week and are available as an [place], PharmD, MPH Clinical Pharmacist (Rheumatology and Pulmonology)

## 2020-08-07 NOTE — Telephone Encounter (Signed)
Called patient to verify he had received Ofev. He states he has not yet received. Per [employer] rep, patient's Tyler Hendricks is expected to ship today and receive by Friday.  Knox Saliva, PharmD, MPH Clinical Pharmacist (Rheumatology and Pulmonology)

## 2020-08-09 ENCOUNTER — Ambulatory Visit: Payer: Medicare Other | Admitting: Interventional Cardiology

## 2020-08-13 ENCOUNTER — Telehealth: Payer: Self-pay | Admitting: Pulmonary Disease

## 2020-08-13 DIAGNOSIS — M545 Low back pain, unspecified: Secondary | ICD-10-CM | POA: Diagnosis not present

## 2020-08-13 DIAGNOSIS — Z79891 Long term (current) use of opiate analgesic: Secondary | ICD-10-CM | POA: Diagnosis not present

## 2020-08-13 DIAGNOSIS — M5416 Radiculopathy, lumbar region: Secondary | ICD-10-CM | POA: Diagnosis not present

## 2020-08-13 NOTE — Telephone Encounter (Signed)
Pt is calling to see if he can get a refill of the hycodan.    Last filled on 07/11/20 for 240 ml with no refills Last OV was on 06/28/2020 with PM.     PM please advise if refill can be sent to the pharmacy.

## 2020-08-13 NOTE — Progress Notes (Signed)
Cardiology Office Note   Date:  08/14/2020   ID:  Tyler Hendricks, DOB [birth year], MRN [id number]  PCP:  Hoyt Koch, MD    No chief complaint on file.  CAD  Wt Readings from Last 3 Encounters:  08/14/20 212 lb (96.2 kg)  06/28/20 214 lb (97.1 kg)  05/24/20 215 lb 9.6 oz (97.8 kg)       History of Present Illness: Tyler Hendricks is a 70 y.o. male   [place] stents placed in 2000 while in [place]. He has Ah/o HIV as well, since 1989. He is compliant with meds. Viral load is nondetectable.   OM1 3.0 x 23 mm, left posterolateral 2.5 x 13 mm, diagonal 1 2.5 x 13, ostial diagonal 2.5 mm, proximal LAD- unknown size, mid LAD 3.0 x 13 - all BMS.   Last cath was 2005. All stents were patent and there was moderate disease in the right PDA and right PLA.  In 2010, he had a monitor showing NSR with PVCs. He was diagnosed with moderate aortic stenosis in 2018 by echocardiogram while in [place].  He has had issues with leg cramps.  In the past, it was noted that he walks several times a day.  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  During COVID-19 quarantine, he gained weight.  He has since lost some of that weight.  Diagnosed with IPF.  Now needs to start the Ofev. HIV under control per his report.  Denies : Chest pain. Dizziness. Leg edema. Nitroglycerin use. Orthopnea. Palpitations. Paroxysmal nocturnal dyspnea. Shortness of breath. Syncope.   Walks his dog three times a day.       Past Medical History:  Diagnosis Date  . Abnormal result of cardiovascular function study, unspecified   . Atherosclerotic heart disease of native coronary artery without angina pectoris   . CAD (coronary artery disease)   . Coronary angioplasty status   . Diabetes mellitus without complication ([place])   . Essential (primary) hypertension   . Gallstones   . Hepatic cirrhosis ([place])   . Hepatitis C   . HIV infection ([place])   . Hyperlipidemia    . Hypertension   . Internal hemorrhoids   . Kidney stones   . Mixed hyperlipidemia   . Palpitations   . Sleep apnea    CPAP  . Tubular adenoma of colon     Past Surgical History:  Procedure Laterality Date  . CARDIAC CATHETERIZATION Left 04/2013  . chlecystectomy    . COLONOSCOPY  05/02/2013  . PERCUTANEOUS CORONARY STENT INTERVENTION (PCI-S)    . POLYPECTOMY    . UMBILICAL HERNIA REPAIR       Current Outpatient Medications  Medication Sig Dispense Refill  . amLODipine (NORVASC) 2.5 MG tablet TAKE 1 TABLET(2.5 MG) BY MOUTH DAILY 90 tablet 3  . atenolol (TENORMIN) 50 MG tablet Take 1 tablet (50 mg total) by mouth daily. 90 tablet 3  . bictegravir-emtricitabine-tenofovir AF (BIKTARVY) 50-200-25 MG TABS tablet TAKE 1 TABLET BY MOUTH DAILY. 30 tablet 11  . clopidogrel (PLAVIX) 75 MG tablet TAKE 1 TABLET(75 MG) BY MOUTH DAILY 90 tablet 3  . HYDROcodone-homatropine (HYCODAN) 5-1.5 MG/5ML syrup Take 5 mLs by mouth every 6 (six) hours as needed for cough. 240 mL 0  . losartan-hydrochlorothiazide (HYZAAR) 100-25 MG tablet Take 1 tablet by mouth daily. 90 tablet 3  . Multiple Vitamin (MULTIVITAMIN) tablet Take 1 tablet by mouth daily.    Marland Kitchen oxyCODONE (OXY  IR/ROXICODONE) 5 MG immediate release tablet TAKE 1 TABLET BY MOUTH EVERY 6 HOURS AS NEEDED FOR SEVERE PAIN 75 tablet 0  . rosuvastatin (CRESTOR) 10 MG tablet TAKE 1 TABLET(10 MG) BY MOUTH DAILY 90 tablet 3  . SYNJARDY XR 12.07-998 MG TB24 TK 2 TS PO QD 180 tablet 3  . VASCEPA 1 g capsule Take 2 capsules (2 g total) by mouth 2 (two) times daily. 360 capsule 3   No current facility-administered medications for this visit.    Allergies:   Lopressor [metoprolol tartrate], Integrilin [eptifibatide], and Penicillins    Social History:  The patient  reports that he quit smoking about 28 years ago. His smoking use included cigarettes. He started smoking about 48 years ago. He has a 40.00 pack-year smoking history. He has never used  smokeless tobacco. He reports current alcohol use. He reports that he does not use drugs.   Family History:  The patient's family history includes Emphysema in his mother; Esophageal cancer in his brother and brother; Heart attack (age of onset: 42) in his father; Lung cancer in his sister.    ROS:  Please see the history of present illness.   Otherwise, review of systems are positive for weight loss.   All other systems are reviewed and negative.    PHYSICAL EXAM: VS:  BP 112/70   Pulse 60   Ht 5\' 11"  (1.803 m)   Wt 212 lb (96.2 kg)   SpO2 96%   BMI 29.57 kg/m  , BMI Body mass index is 29.57 kg/m. GEN: Well nourished, well developed, in no acute distress  HEENT: normal  Neck: no JVD, carotid bruits, or masses Cardiac: RRR; 2/6 early systolic  Murmurs, no rubs, or gallops,no edema  Respiratory:  clear to auscultation bilaterally, normal work of breathing GI: soft, nontender, nondistended, + BS MS: no deformity or atrophy  Skin: warm and dry, no rash Neuro:  Strength and sensation are intact Psych: euthymic mood, full affect   EKG:   The ekg ordered today demonstrates NSR, RBBB, no change from prior   Recent Labs: 11/15/2019: Hemoglobin 16.0; Platelets 160; TSH 2.29 05/10/2020: ALT 27; BUN 15; Creat 0.69; Potassium 4.1; Sodium 139   Lipid Panel    Component Value Date/Time   CHOL 145 11/15/2019 1556   TRIG 49 11/15/2019 1556   HDL 78 11/15/2019 1556   CHOLHDL 1.9 11/15/2019 1556   LDLCALC 53 11/15/2019 1556     Other studies Reviewed: Additional studies/ records that were reviewed today with results demonstrating: .   ASSESSMENT AND PLAN:  1. CAD: No angina. Continue aggressive secondary prevention.  2. Hypertension: The current medical regimen is effective;  continue present plan and medications. 3. DM: High fiber, whole food, plant based diet.  A1C 6.2 in 2021.  4. Aortic stenosis: Mild in 2018. Repeat echo this year. No sx of severer AS. 5. HIV: Managed per  infectious disease.     Current medicines are reviewed at length with the patient today.  The patient concerns regarding his medicines were addressed.  The following changes have been made:  No change  Labs/ tests ordered today include: echo No orders of the defined types were placed in this encounter.   Recommend 150 minutes/week of aerobic exercise Low fat, low carb, high fiber diet recommended  Disposition:   FU in 1 year   Signed, Larae Grooms, MD  08/14/2020 9:44 AM    [place], [place]  [address] Phone: [phone]; Fax: [phone]

## 2020-08-14 ENCOUNTER — Other Ambulatory Visit: Payer: Self-pay

## 2020-08-14 ENCOUNTER — Ambulatory Visit (INDEPENDENT_AMBULATORY_CARE_PROVIDER_SITE_OTHER): Payer: Medicare Other | Admitting: Interventional Cardiology

## 2020-08-14 ENCOUNTER — Encounter: Payer: Self-pay | Admitting: Interventional Cardiology

## 2020-08-14 ENCOUNTER — Ambulatory Visit: Payer: Medicare Other | Admitting: Interventional Cardiology

## 2020-08-14 VITALS — BP 112/70 | HR 60 | Ht 71.0 in | Wt 212.0 lb

## 2020-08-14 DIAGNOSIS — I350 Nonrheumatic aortic (valve) stenosis: Secondary | ICD-10-CM

## 2020-08-14 DIAGNOSIS — E1165 Type 2 diabetes mellitus with hyperglycemia: Secondary | ICD-10-CM

## 2020-08-14 DIAGNOSIS — B20 Human immunodeficiency virus [HIV] disease: Secondary | ICD-10-CM | POA: Diagnosis not present

## 2020-08-14 DIAGNOSIS — I25118 Atherosclerotic heart disease of native coronary artery with other forms of angina pectoris: Secondary | ICD-10-CM | POA: Diagnosis not present

## 2020-08-14 DIAGNOSIS — I10 Essential (primary) hypertension: Secondary | ICD-10-CM

## 2020-08-14 NOTE — Patient Instructions (Signed)
Medication Instructions:  Your physician recommends that you continue on your current medications as directed. Please refer to the Current Medication list given to you today.  *If you need a refill on your cardiac medications before your next appointment, please call your pharmacy*   Lab Work: none If you have labs (blood work) drawn today and your tests are completely normal, you will receive your results only by: Marland Kitchen MyChart Message (if you have MyChart) OR . A paper copy in the mail If you have any lab test that is abnormal or we need to change your treatment, we will call you to review the results.   Testing/Procedures: Your physician has requested that you have an echocardiogram. Echocardiography is a painless test that uses sound waves to create images of your heart. It provides your doctor with information about the size and shape of your heart and how well your heart's chambers and valves are working. This procedure takes approximately one hour. There are no restrictions for this procedure.     Follow-Up: At [facility], you and your health needs are our priority.  As part of our continuing mission to provide you with exceptional heart care, we have created designated Provider Care Teams.  These Care Teams include your primary Cardiologist (physician) and Advanced Practice Providers (APPs -  Physician Assistants and Nurse Practitioners) who all work together to provide you with the care you need, when you need it.  We recommend signing up for the patient portal called "MyChart".  Sign up information is provided on this After Visit Summary.  MyChart is used to connect with patients for Virtual Visits (Telemedicine).  Patients are able to view lab/test results, encounter notes, upcoming appointments, etc.  Non-urgent messages can be sent to your provider as well.   To learn more about what you can do with MyChart, go to [email or web address].    Your next appointment:   12  month(s)  The format for your next appointment:   In Person  Provider:   You may see Larae Grooms, MD or one of the following Advanced Practice Providers on your designated Care Team:    Melina Copa, PA-C  Ermalinda Barrios, PA-C    Other Instructions  High-Fiber Eating Plan Fiber, also called dietary fiber, is a type of carbohydrate. It is found foods such as fruits, vegetables, whole grains, and beans. A high-fiber diet can have many health benefits. Your health care provider may recommend a high-fiber diet to help:  Prevent constipation. Fiber can make your bowel movements more regular.  Lower your cholesterol.  Relieve the following conditions: ? Inflammation of veins in the anus (hemorrhoids). ? Inflammation of specific areas of the digestive tract (uncomplicated diverticulosis). ? A problem of the large intestine, also called the colon, that sometimes causes pain and diarrhea (irritable bowel syndrome, or IBS).  Prevent overeating as part of a weight-loss plan.  Prevent heart disease, type 2 diabetes, and certain cancers. What are tips for following this plan? Reading food labels  Check the nutrition facts label on food products for the amount of dietary fiber. Choose foods that have 5 grams of fiber or more per serving.  The goals for recommended daily fiber intake include: ? Men (age 33 or younger): 34-38 g. ? Men (over age 78): 28-34 g. ? Women (age 35 or younger): 25-28 g. ? Women (over age 39): 22-25 g. Your daily fiber goal is _____________ g.   Shopping  Choose whole fruits and vegetables instead of processed  forms, such as apple juice or applesauce.  Choose a wide variety of high-fiber foods such as avocados, lentils, oats, and kidney beans.  Read the nutrition facts label of the foods you choose. Be aware of foods with added fiber. These foods often have high sugar and sodium amounts per serving. Cooking  Use whole-grain flour for baking and  cooking.  Cook with brown rice instead of white rice. Meal planning  Start the day with a breakfast that is high in fiber, such as a cereal that contains 5 g of fiber or more per serving.  Eat breads and cereals that are made with whole-grain flour instead of refined flour or white flour.  Eat brown rice, bulgur wheat, or millet instead of white rice.  Use beans in place of meat in soups, salads, and pasta dishes.  Be sure that half of the grains you eat each day are whole grains. General information  You can get the recommended daily intake of dietary fiber by: ? Eating a variety of fruits, vegetables, grains, nuts, and beans. ? Taking a fiber supplement if you are not able to take in enough fiber in your diet. It is better to get fiber through food than from a supplement.  Gradually increase how much fiber you consume. If you increase your intake of dietary fiber too quickly, you may have bloating, cramping, or gas.  Drink plenty of water to help you digest fiber.  Choose high-fiber snacks, such as berries, raw vegetables, nuts, and popcorn. What foods should I eat? Fruits Berries. Pears. Apples. Oranges. Avocado. Prunes and raisins. Dried figs. Vegetables Sweet potatoes. Spinach. Kale. Artichokes. Cabbage. Broccoli. Cauliflower. Green peas. Carrots. Squash. Grains Whole-grain breads. Multigrain cereal. Oats and oatmeal. Brown rice. Barley. Bulgur wheat. Coffey. Quinoa. Bran muffins. Popcorn. Rye wafer crackers. Meats and other proteins Navy beans, kidney beans, and pinto beans. Soybeans. Split peas. Lentils. Nuts and seeds. Dairy Fiber-fortified yogurt. Beverages Fiber-fortified soy milk. Fiber-fortified orange juice. Other foods Fiber bars. The items listed above may not be a complete list of recommended foods and beverages. Contact a dietitian for more information. What foods should I avoid? Fruits Fruit juice. Cooked, strained fruit. Vegetables Fried potatoes.  Canned vegetables. Well-cooked vegetables. Grains White bread. Pasta made with refined flour. White rice. Meats and other proteins Fatty cuts of meat. Fried chicken or fried fish. Dairy Milk. Yogurt. Cream cheese. Sour cream. Fats and oils Butters. Beverages Soft drinks. Other foods Cakes and pastries. The items listed above may not be a complete list of foods and beverages to avoid. Talk with your dietitian about what choices are best for you. Summary  Fiber is a type of carbohydrate. It is found in foods such as fruits, vegetables, whole grains, and beans.  A high-fiber diet has many benefits. It can help to prevent constipation, lower blood cholesterol, aid weight loss, and reduce your risk of heart disease, diabetes, and certain cancers.  Increase your intake of fiber gradually. Increasing fiber too quickly may cause cramping, bloating, and gas. Drink plenty of water while you increase the amount of fiber you consume.  The best sources of fiber include whole fruits and vegetables, whole grains, nuts, seeds, and beans. This information is not intended to replace advice given to you by your health care provider. Make sure you discuss any questions you have with your health care provider. Document Revised: 07/13/2019 Document Reviewed: 07/13/2019 Elsevier Patient Education  2021 [employer].

## 2020-08-16 ENCOUNTER — Other Ambulatory Visit (HOSPITAL_COMMUNITY): Payer: Self-pay

## 2020-08-16 MED ORDER — HYDROCODONE BIT-HOMATROP MBR 5-1.5 MG/5ML PO SOLN
5.0000 mL | Freq: Four times a day (QID) | ORAL | 0 refills | Status: DC | PRN
  Filled 2020-08-16: qty 240, 12d supply, fill #0

## 2020-08-16 MED FILL — Bictegravir-Emtricitabine-Tenofovir AF Tab 50-200-25 MG: ORAL | 30 days supply | Qty: 30 | Fill #2 | Status: AC

## 2020-08-16 NOTE — Telephone Encounter (Signed)
The order has been sent to [facility]

## 2020-08-16 NOTE — Telephone Encounter (Signed)
Patient is aware of below message and voiced his understanding.  Nothing further needed at this time.   

## 2020-08-23 NOTE — Telephone Encounter (Signed)
Called patient's wife to f/u on Ofev. She states she has not received shipment of Ofev at home and talked to rep approximately one week after she spoke with me (would be last week approximately but she is unsure).  Called Ofev to check status of order. Rep states rx was pended but unsure why. They are processing prescription today and expected delivery will be latest, Tuesday, 08/27/20.  She states that this coordination of medication has made him even more nervous to start medication. He wants his wife to be with him when he starts it - she will be out of town until 09/03/20 so he will collect Ofev shipment and wait until she is back in town to start medication on 09/03/20.  Will f/u on Monday with [employer] to ensure shipment is confirmed and on the way to patient's home  Knox Saliva, PharmD, MPH Clinical Pharmacist (Rheumatology and Pulmonology)

## 2020-08-26 NOTE — Telephone Encounter (Signed)
Expected shipment of Ofev is tomorrow, 08/27/20, per [facility] rep.  UPS: [id number]  Knox Saliva, PharmD, MPH Clinical Pharmacist (Rheumatology and Pulmonology)

## 2020-09-02 ENCOUNTER — Other Ambulatory Visit: Payer: Self-pay | Admitting: Infectious Diseases

## 2020-09-02 DIAGNOSIS — B20 Human immunodeficiency virus [HIV] disease: Secondary | ICD-10-CM

## 2020-09-06 ENCOUNTER — Other Ambulatory Visit: Payer: Self-pay

## 2020-09-06 ENCOUNTER — Ambulatory Visit (HOSPITAL_COMMUNITY): Payer: Medicare Other | Attending: Cardiology

## 2020-09-06 DIAGNOSIS — I350 Nonrheumatic aortic (valve) stenosis: Secondary | ICD-10-CM | POA: Diagnosis not present

## 2020-09-06 LAB — ECHOCARDIOGRAM COMPLETE
AR max vel: 1.28 cm2
AV Area VTI: 1.28 cm2
AV Area mean vel: 1.18 cm2
AV Mean grad: 20.7 mmHg
AV Peak grad: 36.8 mmHg
Ao pk vel: 3.03 m/s
Area-P 1/2: 2.5 cm2
Calc EF: 63.5 %
S' Lateral: 3.4 cm
Single Plane A2C EF: 64.2 %
Single Plane A4C EF: 63.2 %

## 2020-09-11 ENCOUNTER — Other Ambulatory Visit (HOSPITAL_COMMUNITY): Payer: Self-pay

## 2020-09-11 ENCOUNTER — Telehealth: Payer: Self-pay | Admitting: Pulmonary Disease

## 2020-09-11 MED ORDER — HYDROCODONE BIT-HOMATROP MBR 5-1.5 MG/5ML PO SOLN
5.0000 mL | Freq: Four times a day (QID) | ORAL | 0 refills | Status: DC | PRN
  Filled 2020-09-11: qty 240, 12d supply, fill #0

## 2020-09-11 NOTE — Telephone Encounter (Signed)
Called and left detailed message for pt. Will keep encounter open for 1-2 days to see if pt calls back with any questions.

## 2020-09-11 NOTE — Telephone Encounter (Signed)
I called and spoke with the pt  He states just started the ofev 5 days ago  Doing well so far with no side effects to report  He has appt 10/17/20  He is asking if needs to come in any sooner than this for labs  He also requests a refill on his hydromet- last filled #240 ml 08/16/20  [facility] [address]  Please advise, thanks!

## 2020-09-11 NOTE — Telephone Encounter (Signed)
We can order labs at office visit.  I have ordered the cough syrup

## 2020-09-13 ENCOUNTER — Other Ambulatory Visit (HOSPITAL_COMMUNITY): Payer: Self-pay

## 2020-09-16 ENCOUNTER — Other Ambulatory Visit (HOSPITAL_COMMUNITY): Payer: Self-pay

## 2020-09-16 MED FILL — Bictegravir-Emtricitabine-Tenofovir AF Tab 50-200-25 MG: ORAL | 30 days supply | Qty: 30 | Fill #3 | Status: AC

## 2020-09-19 NOTE — Telephone Encounter (Signed)
Will close encounter

## 2020-10-01 ENCOUNTER — Telehealth: Payer: Self-pay | Admitting: Pulmonary Disease

## 2020-10-01 ENCOUNTER — Other Ambulatory Visit (HOSPITAL_COMMUNITY): Payer: Self-pay

## 2020-10-01 MED ORDER — HYDROCODONE BIT-HOMATROP MBR 5-1.5 MG/5ML PO SOLN: 5.0000 mL | Freq: Four times a day (QID) | ORAL | 0 refills | Status: DC | PRN

## 2020-10-01 NOTE — Telephone Encounter (Signed)
Please discontinue Hycodan. He can take tesslon perles or use delsym for cough. Please let them know I appeciate their call to notify us.   Cc: Mannam

## 2020-10-01 NOTE — Telephone Encounter (Signed)
Called [facility] and spoke with Pharmacist. Tyler Hendricks received Hycodan prescription and wanted to make sure Tyler Maize, NP was aware Patient was prescribed Oxy 10mg  BID by another provider and prescription was picked up by Patient today. [facility] is waiting for Beth, NP to advise if she would like to continue Hycodan prescription or change. Oxy is not on current patient med list.  Message routed to Rotan, NP to advise

## 2020-10-01 NOTE — Telephone Encounter (Signed)
Sent in refill, needs to discuss ongoing cough management at follow-up with Dr. Vaughan Browner

## 2020-10-01 NOTE — Telephone Encounter (Signed)
Call made to pharmacy aware to cancel prescription for Hycodan.   LM informing patient.   Nothing further needed at this time.

## 2020-10-01 NOTE — Telephone Encounter (Signed)
Call made to patient, confirmed DOB. Patient requesting refill of Hycodan cough syrup.   Last Filled: 09/11/20 quantity 240 ml  Last OV: 06/28/20  EW please advise if willing to fill. PM is on vacation. Thanks :)

## 2020-10-04 DIAGNOSIS — H35033 Hypertensive retinopathy, bilateral: Secondary | ICD-10-CM | POA: Diagnosis not present

## 2020-10-04 DIAGNOSIS — H0288A Meibomian gland dysfunction right eye, upper and lower eyelids: Secondary | ICD-10-CM | POA: Diagnosis not present

## 2020-10-04 DIAGNOSIS — E119 Type 2 diabetes mellitus without complications: Secondary | ICD-10-CM | POA: Diagnosis not present

## 2020-10-04 DIAGNOSIS — H0288B Meibomian gland dysfunction left eye, upper and lower eyelids: Secondary | ICD-10-CM | POA: Diagnosis not present

## 2020-10-04 DIAGNOSIS — H04123 Dry eye syndrome of bilateral lacrimal glands: Secondary | ICD-10-CM | POA: Diagnosis not present

## 2020-10-04 DIAGNOSIS — H0102A Squamous blepharitis right eye, upper and lower eyelids: Secondary | ICD-10-CM | POA: Diagnosis not present

## 2020-10-04 DIAGNOSIS — H0102B Squamous blepharitis left eye, upper and lower eyelids: Secondary | ICD-10-CM | POA: Diagnosis not present

## 2020-10-04 DIAGNOSIS — H2513 Age-related nuclear cataract, bilateral: Secondary | ICD-10-CM | POA: Diagnosis not present

## 2020-10-04 DIAGNOSIS — H353132 Nonexudative age-related macular degeneration, bilateral, intermediate dry stage: Secondary | ICD-10-CM | POA: Diagnosis not present

## 2020-10-04 LAB — HM DIABETES EYE EXAM

## 2020-10-07 ENCOUNTER — Other Ambulatory Visit (INDEPENDENT_AMBULATORY_CARE_PROVIDER_SITE_OTHER): Payer: Medicare Other

## 2020-10-07 DIAGNOSIS — Z5181 Encounter for therapeutic drug level monitoring: Secondary | ICD-10-CM | POA: Diagnosis not present

## 2020-10-07 LAB — HEPATIC FUNCTION PANEL
ALT: 28 U/L (ref 0–53)
AST: 36 U/L (ref 0–37)
Albumin: 3.9 g/dL (ref 3.5–5.2)
Alkaline Phosphatase: 148 U/L — ABNORMAL HIGH (ref 39–117)
Bilirubin, Direct: 0.6 mg/dL — ABNORMAL HIGH (ref 0.0–0.3)
Total Bilirubin: 3 mg/dL — ABNORMAL HIGH (ref 0.2–1.2)
Total Protein: 6.5 g/dL (ref 6.0–8.3)

## 2020-10-07 LAB — CBC WITH DIFFERENTIAL/PLATELET
Basophils Absolute: 0 10*3/uL (ref 0.0–0.1)
Basophils Relative: 1 % (ref 0.0–3.0)
Eosinophils Absolute: 0.1 10*3/uL (ref 0.0–0.7)
Eosinophils Relative: 2.8 % (ref 0.0–5.0)
HCT: 46.8 % (ref 39.0–52.0)
Hemoglobin: 16.8 g/dL (ref 13.0–17.0)
Lymphocytes Relative: 26.5 % (ref 12.0–46.0)
Lymphs Abs: 1.3 10*3/uL (ref 0.7–4.0)
MCHC: 35.9 g/dL (ref 30.0–36.0)
MCV: 94.6 fl (ref 78.0–100.0)
Monocytes Absolute: 0.4 10*3/uL (ref 0.1–1.0)
Monocytes Relative: 8.1 % (ref 3.0–12.0)
Neutro Abs: 3 10*3/uL (ref 1.4–7.7)
Neutrophils Relative %: 61.6 % (ref 43.0–77.0)
Platelets: 103 10*3/uL — ABNORMAL LOW (ref 150.0–400.0)
RBC: 4.94 Mil/uL (ref 4.22–5.81)
RDW: 14.8 % (ref 11.5–15.5)
WBC: 4.9 10*3/uL (ref 4.0–10.5)

## 2020-10-11 ENCOUNTER — Other Ambulatory Visit (HOSPITAL_COMMUNITY): Payer: Self-pay

## 2020-10-11 MED FILL — Bictegravir-Emtricitabine-Tenofovir AF Tab 50-200-25 MG: ORAL | 30 days supply | Qty: 30 | Fill #4 | Status: AC

## 2020-10-17 ENCOUNTER — Other Ambulatory Visit: Payer: Self-pay

## 2020-10-17 ENCOUNTER — Ambulatory Visit (INDEPENDENT_AMBULATORY_CARE_PROVIDER_SITE_OTHER): Payer: Medicare Other | Admitting: Pulmonary Disease

## 2020-10-17 ENCOUNTER — Encounter: Payer: Self-pay | Admitting: Pulmonary Disease

## 2020-10-17 VITALS — BP 118/70 | HR 60 | Ht 71.0 in | Wt 204.8 lb

## 2020-10-17 DIAGNOSIS — J84112 Idiopathic pulmonary fibrosis: Secondary | ICD-10-CM

## 2020-10-17 DIAGNOSIS — Z5181 Encounter for therapeutic drug level monitoring: Secondary | ICD-10-CM | POA: Diagnosis not present

## 2020-10-17 DIAGNOSIS — I25118 Atherosclerotic heart disease of native coronary artery with other forms of angina pectoris: Secondary | ICD-10-CM

## 2020-10-17 DIAGNOSIS — G4733 Obstructive sleep apnea (adult) (pediatric): Secondary | ICD-10-CM | POA: Diagnosis not present

## 2020-10-17 NOTE — Progress Notes (Signed)
Tyler Hendricks    [id number]    1951/03/16  Primary Care Physician:Crawford, Real Cons, MD  Referring Physician: Hoyt Koch, MD [address] [place],   [address]  Chief complaint: Follow-up for pulmonary fibrosis  HPI: 70 year old with history of coronary artery disease, HIV, hepatitis C, cirrhosis Complains of chronic cough for the past several years. He has paroxysms of cough associated with dyspnea. Mild chest congestion History notable for COVID-19 infection in December 2020. He had very mild symptoms and did not require hospitalization.  CT scan with probable UIP pattern pulmonary fibrosis. He has been referred to the ILD clinic for further evaluation  Pets: Has a dog. No cats, birds, farm animals Occupation: Retired Development worker, community Exposures: No known exposures. No mold, hot tub, Jacuzzi. No down pillows or comforters ILD questionnaire 10/03/2019-negative Smoking history: 60-pack-year smoker. Quit in late 1990s Travel history: Originally from [place]. Moved to [place] in 2018 Relevant family history: Mother had emphysema. She was a heavy smoker.  Interim history: Started on Ofev in late June 2022.  Sophia is tolerating it well with no issues.  He has minimal to some discomfort Continues to have coughing which is improved somewhat with Hycodan cough syrup  Outpatient Encounter Medications as of 10/17/2020  Medication Sig   amLODipine (NORVASC) 2.5 MG tablet TAKE 1 TABLET(2.5 MG) BY MOUTH DAILY   atenolol (TENORMIN) 50 MG tablet Take 1 tablet (50 mg total) by mouth daily.   bictegravir-emtricitabine-tenofovir AF (BIKTARVY) 50-200-25 MG TABS tablet TAKE 1 TABLET BY MOUTH DAILY.   clopidogrel (PLAVIX) 75 MG tablet TAKE 1 TABLET(75 MG) BY MOUTH DAILY   HYDROcodone bit-homatropine (HYCODAN) 5-1.5 MG/5ML syrup Take 5 mLs by mouth every 6 (six) hours as needed for cough.   losartan-hydrochlorothiazide (HYZAAR) 100-25 MG  tablet Take 1 tablet by mouth daily.   Multiple Vitamin (MULTIVITAMIN) tablet Take 1 tablet by mouth daily.   Nintedanib (OFEV) 100 MG CAPS    rosuvastatin (CRESTOR) 10 MG tablet TAKE 1 TABLET(10 MG) BY MOUTH DAILY   SYNJARDY XR 12.07-998 MG TB24 TK 2 TS PO QD   VASCEPA 1 g capsule Take 2 capsules (2 g total) by mouth 2 (two) times daily.   No facility-administered encounter medications on file as of 10/17/2020.    Physical Exam: Blood pressure 118/70, pulse 60, height '5\' 11"'$  (1.803 m), weight 204 lb 12.8 oz (92.9 kg), SpO2 96 %. Gen:      No acute distress HEENT:  EOMI, sclera anicteric Neck:     No masses; no thyromegaly Lungs:    Bibasal crackles CV:         Regular rate and rhythm; no murmurs Abd:      + bowel sounds; soft, non-tender; no palpable masses, no distension Ext:    No edema; adequate peripheral perfusion Skin:      Warm and dry; no rash Neuro: alert and oriented x 3 Psych: normal mood and affect   Data Reviewed: Imaging: High-res CT 07/13/2019-groundglass attenuation with septal thickening, cylindrical bronchiectasis with mild basal gradient Probable UIP pattern High-res CT 06/24/2020-stable pulmonary fibrosis and probable UIP pattern I have reviewed the images personally  PFTs: 09/01/2019 FVC 3.78 [78%], FEV1 3.14 [87%], F/F 83, TLC 6.40 [86%], DLCO 22.76 [81%] Normal test  06/28/2020 FVC 3.70 [71%], FEV1 3.05 [85%], F/F 82, TLC 5.76 [79%], DLCO 19.32 [69%] Minimal restriction, mild diffusion defect  Labs: CTD serologies 08/30/2019-positive for ANA 1:40, nuclear,  speckled  Sleep: CPAP download 11/25/2019-2% compliance  Assessment:  Pulmonary fibrosis, IPF Has probable UIP pattern on imaging There is no symptoms of connective tissue disease. Borderline positive ANA is likely nonspecific He did have Covid in December 2020 but it was a very mild case with no hospitalization, besides his symptoms preceded the Covid infection  He likely has IPF based on CT  appearance in male smoker. Discussed further work-up such as bronchoscopy, lung biopsy but he wants to avoid invasive procedures  After multiple discussions informed decision making with the patient we have started treatment with Ofev which is tolerating well so far.  We need to be cautious given history of cirrhosis LFTs reviewed with mild increase in bilirubin which is unchanged from studies 1 year ago.  Transaminases are normal Follow monthly labs  Severe OSA Noncompliant with CPAP.  Encouraged him to use it daily.  Health maintenance Up-to-date with Covid booster and flu vaccine  Plan/Recommendations: Continue Ofev Continue lab monitoring  Follow-up in 3 months  Marshell Garfinkel MD [facility] 10/17/2020, 9:15 AM  CC: Hoyt Koch, *

## 2020-10-17 NOTE — Patient Instructions (Signed)
I have reviewed your blood tests which shows a small elevation in bilirubin which may account for the jaundice.  This has been stable for the past year  I will let your gastroenterologist be aware for follow-up  We will continue to follow liver tests monthly for the next 5 months.  Continue Ofev, follow up in 3 months

## 2020-10-23 ENCOUNTER — Other Ambulatory Visit (HOSPITAL_COMMUNITY): Payer: Self-pay

## 2020-10-29 ENCOUNTER — Encounter: Payer: Self-pay | Admitting: Adult Health

## 2020-10-29 ENCOUNTER — Ambulatory Visit (INDEPENDENT_AMBULATORY_CARE_PROVIDER_SITE_OTHER): Payer: Medicare Other | Admitting: Adult Health

## 2020-10-29 ENCOUNTER — Telehealth: Payer: Self-pay | Admitting: Adult Health

## 2020-10-29 VITALS — BP 123/75 | HR 60 | Ht 71.0 in | Wt 198.8 lb

## 2020-10-29 DIAGNOSIS — Z9989 Dependence on other enabling machines and devices: Secondary | ICD-10-CM

## 2020-10-29 DIAGNOSIS — G4733 Obstructive sleep apnea (adult) (pediatric): Secondary | ICD-10-CM | POA: Diagnosis not present

## 2020-10-29 DIAGNOSIS — I25118 Atherosclerotic heart disease of native coronary artery with other forms of angina pectoris: Secondary | ICD-10-CM

## 2020-10-29 NOTE — Patient Instructions (Signed)
Your Plan:  Try restart CPAP Will discuss with Dr. Rexene Alberts about inspire device If your symptoms worsen or you develop new symptoms please let us know.   Thank you for coming to see [place] at [facility]. I hope we have been able to provide you high quality care today.  You may receive a patient satisfaction survey over the next few weeks. We would appreciate your feedback and comments so that we may continue to improve ourselves and the health of our patients.

## 2020-10-29 NOTE — Progress Notes (Addendum)
PATIENT: Tyler Hendricks DOB: 03/26/1950  REASON FOR VISIT: follow up HISTORY FROM: patient Primary neurologist: Dr. Rexene Alberts  HISTORY OF PRESENT ILLNESS: Today 10/29/20:  Tyler Hendricks is a 70 year old male with a history of obstructive sleep apnea on CPAP.  He returns today for follow-up.  He reports that he has not been using the CPAP.  At the last visit he was also not using the machine.  States that he has some pulmonary issues as well as other things that occupy his thoughts and he forgets to use the CPAP.  According to the pulmonologist note he has been diagnosed with idiopathic pulmonary fibrosis and is currently on Ofev.  He returns today for follow-up.  04/29/20: Tyler Hendricks is a 70 year old male with a history of obstructive sleep apnea on CPAP.  He reports that he has not been using the machine for the last 6 months.  He reports that he got out of the habit of using it.  Reports that sometimes he will fall asleep on the couch and that he does not put his machine on.  He states for the last 2 days he has restarted using it.  He returns today for evaluation.  04/26/19 Tyler Hendricks is a 70 year old male with a history of obstructive sleep apnea on CPAP.  His download indicates that he uses machine 21 out of 30 days for compliance of 70%.  He uses machine greater than 4 hours 11 days for compliance of 37%.  On average he uses his machine 4 hours and 22 minutes.  His residual AHI is 3.6 on 7 to 15 cm of water with EPR of 2.  His leak in the 95th percentile is 9.6 L/min.  He reports that there are some nights he gets up to the bathroom and does not put his machine back on.  He still feels that the CPAP works well for him.  His Epworth sleepiness score is 610 fatigue severity score is 13.  HISTORY  10/13/18: Tyler Hendricks is a 70 year old male with a history of obstructive sleep apnea on CPAP.  He returns today for follow-up.  His download indicates that he use his machine 23 out of 30 days for compliance of 77%.   He uses machine greater than 4 hours 11 days for compliance of 37%.  On average he uses his machine 3 hours and 56 minutes.  His residual AHI is 4.2 on 7 to 15 cm of water with EPR of 2.  His leak in the 95th percentile is 9.8.  He reports that some nights he does not get more than 4 hours of sleep.  He states that he is getting more used to the mask.  His Epworth sleepiness score is 2 and fatigue severity score is 16.  He returns today for follow-up.  REVIEW OF SYSTEMS: Out of a complete 14 system review of symptoms, the patient complains only of the following symptoms, and all other reviewed systems are negative.  Fatigue severity score 34 Epworth sleepiness score 13  ALLERGIES: Allergies  Allergen Reactions   Lopressor [Metoprolol Tartrate] Rash and Other (See Comments)    Bleeding (non-specific)    Integrilin [Eptifibatide] Other (See Comments)    Bleeding (non-specific)   Penicillins Diarrhea and Rash    HOME MEDICATIONS: Outpatient Medications Prior to Visit  Medication Sig Dispense Refill   amLODipine (NORVASC) 2.5 MG tablet TAKE 1 TABLET(2.5 MG) BY MOUTH DAILY 90 tablet 3   atenolol (TENORMIN) 50 MG tablet Take 1 tablet (50  mg total) by mouth daily. 90 tablet 3   bictegravir-emtricitabine-tenofovir AF (BIKTARVY) 50-200-25 MG TABS tablet TAKE 1 TABLET BY MOUTH DAILY. 30 tablet 11   clopidogrel (PLAVIX) 75 MG tablet TAKE 1 TABLET(75 MG) BY MOUTH DAILY 90 tablet 3   HYDROcodone bit-homatropine (HYCODAN) 5-1.5 MG/5ML syrup Take 5 mLs by mouth every 6 (six) hours as needed for cough. 240 mL 0   losartan-hydrochlorothiazide (HYZAAR) 100-25 MG tablet Take 1 tablet by mouth daily. 90 tablet 3   Multiple Vitamin (MULTIVITAMIN) tablet Take 1 tablet by mouth daily.     Nintedanib (OFEV) 100 MG CAPS      rosuvastatin (CRESTOR) 10 MG tablet TAKE 1 TABLET(10 MG) BY MOUTH DAILY 90 tablet 3   SYNJARDY XR 12.07-998 MG TB24 TK 2 TS PO QD 180 tablet 3   VASCEPA 1 g capsule Take 2 capsules (2 g  total) by mouth 2 (two) times daily. 360 capsule 3   No facility-administered medications prior to visit.    PAST MEDICAL HISTORY: Past Medical History:  Diagnosis Date   Abnormal result of cardiovascular function study, unspecified    Atherosclerotic heart disease of native coronary artery without angina pectoris    CAD (coronary artery disease)    Coronary angioplasty status    Diabetes mellitus without complication ([place])    Essential (primary) hypertension    Gallstones    Hepatic cirrhosis (HCC)    Hepatitis C    HIV infection ([place])    Hyperlipidemia    Hypertension    Internal hemorrhoids    Kidney stones    Mixed hyperlipidemia    Palpitations    Sleep apnea    CPAP   Tubular adenoma of colon     PAST SURGICAL HISTORY: Past Surgical History:  Procedure Laterality Date   CARDIAC CATHETERIZATION Left 04/2013   chlecystectomy     COLONOSCOPY  05/02/2013   PERCUTANEOUS CORONARY STENT INTERVENTION (PCI-S)     POLYPECTOMY     UMBILICAL HERNIA REPAIR      FAMILY HISTORY: Family History  Problem Relation Age of Onset   Emphysema Mother        pulmonary   Heart attack Father 2   Lung cancer Sister    Esophageal cancer Brother    Esophageal cancer Brother    Colon cancer Neg Hx    Inflammatory bowel disease Neg Hx    Liver disease Neg Hx    Pancreatic cancer Neg Hx    Rectal cancer Neg Hx    Stomach cancer Neg Hx    Colon polyps Neg Hx     SOCIAL HISTORY: Social History   Socioeconomic History   Marital status: Married    Spouse name: Not on file   Number of children: 1   Years of education: Not on file   Highest education level: 10th grade  Occupational History   Occupation: retired  Tobacco Use   Smoking status: Former    Packs/day: 2.00    Years: 20.00    Pack years: 40.00    Types: Cigarettes    Start date: 1974    Quit date: 1994    Years since quitting: 28.6   Smokeless tobacco: Never  Vaping Use   Vaping Use: Never used  Substance  and Sexual Activity   Alcohol use: Yes    Comment: social-occ beer   Drug use: No   Sexual activity: Yes  Other Topics Concern   Not on file  Social History Narrative   Not  on file   Social Determinants of Health   Financial Resource Strain: Not on file  Food Insecurity: Not on file  Transportation Needs: Not on file  Physical Activity: Not on file  Stress: Not on file  Social Connections: Not on file  Intimate Partner Violence: Not on file      PHYSICAL EXAM  There were no vitals filed for this visit.  Body mass index is 27.73 kg/m. Generalized: Well developed, in no acute distress  Chest: Lungs clear to auscultation bilaterally  Neurological examination  Mentation: Alert oriented to time, place, history taking. Follows all commands speech and language fluent Cranial nerve II-XII: Extraocular movements were full, visual field were full on confrontational test Head turning and shoulder shrug  were normal and symmetric. Motor: The motor testing reveals 5 over 5 strength of all 4 extremities. Good symmetric motor tone is noted throughout.  Sensory: Sensory testing is intact to soft touch on all 4 extremities. No evidence of extinction is noted.  Gait and station: Gait is normal.    DIAGNOSTIC DATA (LABS, IMAGING, TESTING) - I reviewed patient records, labs, notes, testing and imaging myself where available.  Lab Results  Component Value Date   WBC 4.9 10/07/2020   HGB 16.8 10/07/2020   HCT 46.8 10/07/2020   MCV 94.6 10/07/2020   PLT 103.0 (L) 10/07/2020      Component Value Date/Time   NA 139 05/10/2020 1009   K 4.1 05/10/2020 1009   CL 103 05/10/2020 1009   CO2 29 05/10/2020 1009   GLUCOSE 246 (H) 05/10/2020 1009   BUN 15 05/10/2020 1009   CREATININE 0.69 (L) 05/10/2020 1009   CALCIUM 9.5 05/10/2020 1009   PROT 6.5 10/07/2020 1028   ALBUMIN 3.9 10/07/2020 1028   AST 36 10/07/2020 1028   ALT 28 10/07/2020 1028   ALKPHOS 148 (H) 10/07/2020 1028   BILITOT  3.0 (H) 10/07/2020 1028   GFRNONAA 97 05/10/2020 1009   GFRAA 112 05/10/2020 1009   Lab Results  Component Value Date   CHOL 145 11/15/2019   HDL 78 11/15/2019   LDLCALC 53 11/15/2019   TRIG 49 11/15/2019   CHOLHDL 1.9 11/15/2019   Lab Results  Component Value Date   HGBA1C 6.2 (A) 11/15/2019   Lab Results  Component Value Date   [id number] 11/15/2019   Lab Results  Component Value Date   TSH 2.29 11/15/2019      ASSESSMENT AND PLAN 70 y.o. year old male  has a past medical history of Abnormal result of cardiovascular function study, unspecified, Atherosclerotic heart disease of native coronary artery without angina pectoris, CAD (coronary artery disease), Coronary angioplasty status, Diabetes mellitus without complication ([place]), Essential (primary) hypertension, Gallstones, Hepatic cirrhosis ([place]), Hepatitis C, HIV infection ([place]), Hyperlipidemia, Hypertension, Internal hemorrhoids, Kidney stones, Mixed hyperlipidemia, Palpitations, Sleep apnea, and Tubular adenoma of colon. here with:  Obstructive sleep apnea on CPAP  Encouraged the patient to restart CPAP therapy Reviewed the risk factors associated with untreated sleep apnea Patient advised that he plans to restart therapy Discussed inspire device per patient's request.  We will discuss with Dr. Rexene Alberts if he is a candidate  follow-up in 6 months or sooner if needed   Ward Givens, MSN, NP-C 10/29/2020, 9:07 AM [facility] [address] [place], [place] [address] [phone]  I reviewed the above note and documentation by the Nurse Practitioner and agree with the history, exam, assessment and plan as outlined above. I was available  for consultation. Star Age, MD, PhD [facility])

## 2020-10-29 NOTE — Telephone Encounter (Signed)
For now, I recommend we approach his sleep apnea by optimizing his treatment with positive airway pressure.  Given his complex medical history including history of heart disease and lung disease, I am afraid he may not be a good surgical candidate.  I recommend we bring him in for formal titration.  Please discuss with patient and put an order for CPAP titration due to difficulty tolerating AutoPap, history of severe sleep apnea.

## 2020-10-29 NOTE — Telephone Encounter (Signed)
Hey Dr. Rexene Alberts- I saw [name] in the office today. He is having trouble using the CPAP. He is interesting in inspire. Do you think he is a good candidate?

## 2020-10-30 NOTE — Telephone Encounter (Signed)
Sandy/Bethany: Please put order in for CPAP titration with diagnosis of Dx: difficulty with positive airway device, severe OSA, heart Dx and pulm fibrosis.  Placed the order under Dr. Rexene Alberts so that the results come to her. Thanks!

## 2020-10-30 NOTE — Telephone Encounter (Signed)
Done

## 2020-10-30 NOTE — Addendum Note (Signed)
Addended by: Oliver Hum S on: 10/30/2020 11:04 AM   Modules accepted: Orders

## 2020-11-06 ENCOUNTER — Telehealth: Payer: Self-pay | Admitting: Pulmonary Disease

## 2020-11-06 MED ORDER — HYDROCODONE BIT-HOMATROP MBR 5-1.5 MG/5ML PO SOLN
5.0000 mL | Freq: Four times a day (QID) | ORAL | 0 refills | Status: DC | PRN
Start: 2020-11-06 — End: 2021-01-01

## 2020-11-06 NOTE — Telephone Encounter (Signed)
Called spoke with patient.  He states he is out of cough medication and needs more. His cough is back its dry and no other symptoms.   Sending to Dr. Vaughan Browner this is a controlled medication.

## 2020-11-06 NOTE — Telephone Encounter (Signed)
I have called and spoke with pt and he is aware of cough med that has been sent in per PM.  Nothing further is needed.

## 2020-11-06 NOTE — Telephone Encounter (Signed)
I have renewed the medication

## 2020-11-12 ENCOUNTER — Encounter: Payer: Self-pay | Admitting: Internal Medicine

## 2020-11-12 ENCOUNTER — Other Ambulatory Visit: Payer: Self-pay

## 2020-11-12 ENCOUNTER — Ambulatory Visit (INDEPENDENT_AMBULATORY_CARE_PROVIDER_SITE_OTHER): Payer: Medicare Other | Admitting: Internal Medicine

## 2020-11-12 VITALS — BP 118/78 | HR 51 | Temp 97.9°F | Resp 18 | Ht 71.0 in | Wt 206.8 lb

## 2020-11-12 DIAGNOSIS — K7460 Unspecified cirrhosis of liver: Secondary | ICD-10-CM | POA: Diagnosis not present

## 2020-11-12 DIAGNOSIS — E1165 Type 2 diabetes mellitus with hyperglycemia: Secondary | ICD-10-CM

## 2020-11-12 DIAGNOSIS — I25118 Atherosclerotic heart disease of native coronary artery with other forms of angina pectoris: Secondary | ICD-10-CM | POA: Diagnosis not present

## 2020-11-12 DIAGNOSIS — I10 Essential (primary) hypertension: Secondary | ICD-10-CM

## 2020-11-12 DIAGNOSIS — D696 Thrombocytopenia, unspecified: Secondary | ICD-10-CM

## 2020-11-12 DIAGNOSIS — E118 Type 2 diabetes mellitus with unspecified complications: Secondary | ICD-10-CM

## 2020-11-12 LAB — CBC WITH DIFFERENTIAL/PLATELET
Basophils Absolute: 0 10*3/uL (ref 0.0–0.1)
Basophils Relative: 0.9 % (ref 0.0–3.0)
Eosinophils Absolute: 0.1 10*3/uL (ref 0.0–0.7)
Eosinophils Relative: 3.9 % (ref 0.0–5.0)
HCT: 45.1 % (ref 39.0–52.0)
Hemoglobin: 15.9 g/dL (ref 13.0–17.0)
Lymphocytes Relative: 29 % (ref 12.0–46.0)
Lymphs Abs: 1 10*3/uL (ref 0.7–4.0)
MCHC: 35.2 g/dL (ref 30.0–36.0)
MCV: 97.7 fl (ref 78.0–100.0)
Monocytes Absolute: 0.2 10*3/uL (ref 0.1–1.0)
Monocytes Relative: 7.1 % (ref 3.0–12.0)
Neutro Abs: 2 10*3/uL (ref 1.4–7.7)
Neutrophils Relative %: 59.1 % (ref 43.0–77.0)
Platelets: 87 10*3/uL — ABNORMAL LOW (ref 150.0–400.0)
RBC: 4.61 Mil/uL (ref 4.22–5.81)
RDW: 15.3 % (ref 11.5–15.5)
WBC: 3.4 10*3/uL — ABNORMAL LOW (ref 4.0–10.5)

## 2020-11-12 LAB — HEPATIC FUNCTION PANEL
ALT: 25 U/L (ref 0–53)
AST: 35 U/L (ref 0–37)
Albumin: 3.6 g/dL (ref 3.5–5.2)
Alkaline Phosphatase: 122 U/L — ABNORMAL HIGH (ref 39–117)
Bilirubin, Direct: 0.6 mg/dL — ABNORMAL HIGH (ref 0.0–0.3)
Total Bilirubin: 3 mg/dL — ABNORMAL HIGH (ref 0.2–1.2)
Total Protein: 6.3 g/dL (ref 6.0–8.3)

## 2020-11-12 LAB — PROTIME-INR
INR: 1.2 ratio — ABNORMAL HIGH (ref 0.8–1.0)
Prothrombin Time: 12.6 s (ref 9.6–13.1)

## 2020-11-12 LAB — HEMOGLOBIN A1C: Hgb A1c MFr Bld: 6.5 % (ref 4.6–6.5)

## 2020-11-12 NOTE — Patient Instructions (Addendum)
We will check the blood work today and check the ultrasound of the liver.  If the HgA1c is around or less than 6.5 we will plan to reduce the synjardy to 1 pill daily. Then in 3-6 months we will recheck and stop this if able.

## 2020-11-12 NOTE — Progress Notes (Signed)
   Subjective:   Patient ID: Tyler Hendricks, male    DOB: Nov 03, 1950, 70 y.o.   MRN: [id number]  HPI The patient is a 70 YO man coming in for follow up multiple medical conditions.   Review of Systems  Constitutional: Negative.   HENT: Negative.    Eyes: Negative.   Respiratory:  Negative for cough, chest tightness and shortness of breath.   Cardiovascular:  Negative for chest pain, palpitations and leg swelling.  Gastrointestinal:  Negative for abdominal distention, abdominal pain, constipation, diarrhea, nausea and vomiting.  Musculoskeletal: Negative.   Skin: Negative.   Neurological: Negative.   Psychiatric/Behavioral: Negative.     Objective:  Physical Exam Constitutional:      Appearance: He is well-developed.  HENT:     Head: Normocephalic and atraumatic.  Cardiovascular:     Rate and Rhythm: Normal rate and regular rhythm.  Pulmonary:     Effort: Pulmonary effort is normal. No respiratory distress.     Breath sounds: Normal breath sounds. No wheezing or rales.  Abdominal:     General: Bowel sounds are normal. There is no distension.     Palpations: Abdomen is soft.     Tenderness: There is no abdominal tenderness. There is no rebound.  Musculoskeletal:     Cervical back: Normal range of motion.  Skin:    General: Skin is warm and dry.  Neurological:     Mental Status: He is alert and oriented to person, place, and time.     Coordination: Coordination normal.    Vitals:   11/12/20 0919  BP: 118/78  Pulse: (!) 51  Resp: 18  Temp: 97.9 F (36.6 C)  TempSrc: Oral  SpO2: 96%  Weight: 206 lb 12.8 oz (93.8 kg)  Height: '5\' 11"'$  (1.803 m)    This visit occurred during the SARS-CoV-2 public health emergency.  Safety protocols were in place, including screening questions prior to the visit, additional usage of staff PPE, and extensive cleaning of exam room while observing appropriate contact time as indicated for disinfecting solutions.   Assessment & Plan:

## 2020-11-15 ENCOUNTER — Other Ambulatory Visit (HOSPITAL_COMMUNITY): Payer: Self-pay

## 2020-11-15 MED FILL — Bictegravir-Emtricitabine-Tenofovir AF Tab 50-200-25 MG: ORAL | 30 days supply | Qty: 30 | Fill #5 | Status: AC

## 2020-11-15 NOTE — Assessment & Plan Note (Addendum)
No ascites on exam today. Checking [place] RUQ as he is behind on screening for [facility] and has not seen GI in some time. Is not due for variceal screening until 2023 and given his other specialists he elects to defer until then. Checking PT/INR and hepatic function panel and CBC with diff.

## 2020-11-15 NOTE — Assessment & Plan Note (Signed)
Recent BMP without indication for change to losartan/hctz and amlodipine. BP at goal.

## 2020-11-15 NOTE — Assessment & Plan Note (Signed)
Checking HgA1c and if <7 will decrease synjardy to 1 pill daily. He does have high pill burden.

## 2020-11-15 NOTE — Assessment & Plan Note (Signed)
Due to cirrhosis and checking CBC with diff to monitor.

## 2020-11-18 ENCOUNTER — Telehealth: Payer: Self-pay

## 2020-11-18 NOTE — Telephone Encounter (Signed)
See below

## 2020-11-18 NOTE — Telephone Encounter (Signed)
Please advise as the pt is calling and wanting to know if he needs to receive a Pneumonia vaccine?

## 2020-11-19 NOTE — Telephone Encounter (Signed)
Can have prevnar 20 but is up to date.

## 2020-11-20 ENCOUNTER — Other Ambulatory Visit (HOSPITAL_COMMUNITY): Payer: Self-pay

## 2020-11-20 DIAGNOSIS — Z79891 Long term (current) use of opiate analgesic: Secondary | ICD-10-CM | POA: Diagnosis not present

## 2020-11-20 DIAGNOSIS — Z5181 Encounter for therapeutic drug level monitoring: Secondary | ICD-10-CM | POA: Diagnosis not present

## 2020-11-20 DIAGNOSIS — Z79899 Other long term (current) drug therapy: Secondary | ICD-10-CM | POA: Diagnosis not present

## 2020-11-20 NOTE — Telephone Encounter (Signed)
Called patient. LVM with Dr. Nathanial Millman recommendations. Office number was provided. Mychart message was sent as well.

## 2020-11-22 ENCOUNTER — Ambulatory Visit
Admission: RE | Admit: 2020-11-22 | Discharge: 2020-11-22 | Disposition: A | Discharge status: Home / Self Care | Payer: Medicare Other | Source: Ambulatory Visit | Attending: Internal Medicine | Admitting: Internal Medicine

## 2020-11-22 DIAGNOSIS — K7460 Unspecified cirrhosis of liver: Secondary | ICD-10-CM | POA: Diagnosis not present

## 2020-12-06 ENCOUNTER — Other Ambulatory Visit: Payer: Self-pay

## 2020-12-06 ENCOUNTER — Ambulatory Visit (INDEPENDENT_AMBULATORY_CARE_PROVIDER_SITE_OTHER): Payer: Medicare Other

## 2020-12-06 ENCOUNTER — Other Ambulatory Visit: Payer: Medicare Other

## 2020-12-06 ENCOUNTER — Ambulatory Visit: Payer: Medicare Other

## 2020-12-06 DIAGNOSIS — Z79899 Other long term (current) drug therapy: Secondary | ICD-10-CM

## 2020-12-06 DIAGNOSIS — Z0000 Encounter for general adult medical examination without abnormal findings: Secondary | ICD-10-CM

## 2020-12-06 DIAGNOSIS — B20 Human immunodeficiency virus [HIV] disease: Secondary | ICD-10-CM | POA: Diagnosis not present

## 2020-12-06 NOTE — Patient Instructions (Signed)
Tyler Hendricks , Thank you for taking time to come for your Medicare Wellness Visit. I appreciate your ongoing commitment to your health goals. Please review the following plan we discussed and let me know if I can assist you in the future.   Screening recommendations/referrals: Colonoscopy: 11/01/2018; due every 3 years Recommended yearly ophthalmology/optometry visit for glaucoma screening and checkup Recommended yearly dental visit for hygiene and checkup  Vaccinations: Influenza vaccine: 12/08/2019; due Fall 2022 Pneumococcal vaccine: completed; need documentation Tdap vaccine: 09/07/2018; due every 10 years Shingles vaccine: 01/06/2017   Covid-19: 05/18/2019, 06/14/2019, 12/22/2019, 08/12/2020  Advanced directives: Advance directive discussed with you today. Even though you declined this today please call our office should you change your mind and we can give you the proper paperwork for you to fill out.  Conditions/risks identified: Yes; Client understands the importance of follow-up with providers by attending scheduled visits and discussed goals to eat healthier, increase physical activity, exercise the brain, socialize more, get enough sleep and make time for laughter.  Next appointment: Please schedule your next Medicare Wellness Visit with your Nurse Health Advisor in 1 year by calling [phone].  Preventive Care 70 Years and Older, Male Preventive care refers to lifestyle choices and visits with your health care provider that can promote health and wellness. What does preventive care include? A yearly physical exam. This is also called an annual well check. Dental exams once or twice a year. Routine eye exams. Ask your health care provider how often you should have your eyes checked. Personal lifestyle choices, including: Daily care of your teeth and gums. Regular physical activity. Eating a healthy diet. Avoiding tobacco and drug use. Limiting alcohol use. Practicing safe  sex. Taking low doses of aspirin every day. Taking vitamin and mineral supplements as recommended by your health care provider. What happens during an annual well check? The services and screenings done by your health care provider during your annual well check will depend on your age, overall health, lifestyle risk factors, and family history of disease. Counseling  Your health care provider may ask you questions about your: Alcohol use. Tobacco use. Drug use. Emotional well-being. Home and relationship well-being. Sexual activity. Eating habits. History of falls. Memory and ability to understand (cognition). Work and work Statistician. Screening  You may have the following tests or measurements: Height, weight, and BMI. Blood pressure. Lipid and cholesterol levels. These may be checked every 5 years, or more frequently if you are over 20 years old. Skin check. Lung cancer screening. You may have this screening every year starting at age 70 if you have a 30-pack-year history of smoking and currently smoke or have quit within the past 15 years. Fecal occult blood test (FOBT) of the stool. You may have this test every year starting at age 70. Flexible sigmoidoscopy or colonoscopy. You may have a sigmoidoscopy every 5 years or a colonoscopy every 10 years starting at age 70. Prostate cancer screening. Recommendations will vary depending on your family history and other risks. Hepatitis C blood test. Hepatitis B blood test. Sexually transmitted disease (STD) testing. Diabetes screening. This is done by checking your blood sugar (glucose) after you have not eaten for a while (fasting). You may have this done every 1-3 years. Abdominal aortic aneurysm (AAA) screening. You may need this if you are a current or former smoker. Osteoporosis. You may be screened starting at age 70 if you are at high risk. Talk with your health care provider about your test results,  treatment options, and if  necessary, the need for more tests. Vaccines  Your health care provider may recommend certain vaccines, such as: Influenza vaccine. This is recommended every year. Tetanus, diphtheria, and acellular pertussis (Tdap, Td) vaccine. You may need a Td booster every 10 years. Zoster vaccine. You may need this after age 65. Pneumococcal 13-valent conjugate (PCV13) vaccine. One dose is recommended after age 70. Pneumococcal polysaccharide (PPSV23) vaccine. One dose is recommended after age 70. Talk to your health care provider about which screenings and vaccines you need and how often you need them. This information is not intended to replace advice given to you by your health care provider. Make sure you discuss any questions you have with your health care provider. Document Released: 04/05/2015 Document Revised: 11/27/2015 Document Reviewed: 01/08/2015 Elsevier Interactive Patient Education  2017 [employer] Prevention in the Home Falls can cause injuries. They can happen to people of all ages. There are many things you can do to make your home safe and to help prevent falls. What can I do on the outside of my home? Regularly fix the edges of walkways and driveways and fix any cracks. Remove anything that might make you trip as you walk through a door, such as a raised step or threshold. Trim any bushes or trees on the path to your home. Use bright outdoor lighting. Clear any walking paths of anything that might make someone trip, such as rocks or tools. Regularly check to see if handrails are loose or broken. Make sure that both sides of any steps have handrails. Any raised decks and porches should have guardrails on the edges. Have any leaves, snow, or ice cleared regularly. Use sand or salt on walking paths during winter. Clean up any spills in your garage right away. This includes oil or grease spills. What can I do in the bathroom? Use night lights. Install grab bars by the toilet  and in the tub and shower. Do not use towel bars as grab bars. Use non-skid mats or decals in the tub or shower. If you need to sit down in the shower, use a plastic, non-slip stool. Keep the floor dry. Clean up any water that spills on the floor as soon as it happens. Remove soap buildup in the tub or shower regularly. Attach bath mats securely with double-sided non-slip rug tape. Do not have throw rugs and other things on the floor that can make you trip. What can I do in the bedroom? Use night lights. Make sure that you have a light by your bed that is easy to reach. Do not use any sheets or blankets that are too big for your bed. They should not hang down onto the floor. Have a firm chair that has side arms. You can use this for support while you get dressed. Do not have throw rugs and other things on the floor that can make you trip. What can I do in the kitchen? Clean up any spills right away. Avoid walking on wet floors. Keep items that you use a lot in easy-to-reach places. If you need to reach something above you, use a strong step stool that has a grab bar. Keep electrical cords out of the way. Do not use floor polish or wax that makes floors slippery. If you must use wax, use non-skid floor wax. Do not have throw rugs and other things on the floor that can make you trip. What can I do with my stairs? Do  not leave any items on the stairs. Make sure that there are handrails on both sides of the stairs and use them. Fix handrails that are broken or loose. Make sure that handrails are as long as the stairways. Check any carpeting to make sure that it is firmly attached to the stairs. Fix any carpet that is loose or worn. Avoid having throw rugs at the top or bottom of the stairs. If you do have throw rugs, attach them to the floor with carpet tape. Make sure that you have a light switch at the top of the stairs and the bottom of the stairs. If you do not have them, ask someone to add  them for you. What else can I do to help prevent falls? Wear shoes that: Do not have high heels. Have rubber bottoms. Are comfortable and fit you well. Are closed at the toe. Do not wear sandals. If you use a stepladder: Make sure that it is fully opened. Do not climb a closed stepladder. Make sure that both sides of the stepladder are locked into place. Ask someone to hold it for you, if possible. Clearly mark and make sure that you can see: Any grab bars or handrails. First and last steps. Where the edge of each step is. Use tools that help you move around (mobility aids) if they are needed. These include: Canes. Walkers. Scooters. Crutches. Turn on the lights when you go into a dark area. Replace any light bulbs as soon as they burn out. Set up your furniture so you have a clear path. Avoid moving your furniture around. If any of your floors are uneven, fix them. If there are any pets around you, be aware of where they are. Review your medicines with your doctor. Some medicines can make you feel dizzy. This can increase your chance of falling. Ask your doctor what other things that you can do to help prevent falls. This information is not intended to replace advice given to you by your health care provider. Make sure you discuss any questions you have with your health care provider. Document Released: 01/03/2009 Document Revised: 08/15/2015 Document Reviewed: 04/13/2014 Elsevier Interactive Patient Education  2017 [employer].

## 2020-12-06 NOTE — Progress Notes (Signed)
I connected with Tyler Hendricks today by telephone and verified that I am speaking with the correct person using two identifiers. Location patient: home Location provider: work Persons participating in the virtual visit: patient, provider.   I discussed the limitations, risks, security and privacy concerns of performing an evaluation and management service by telephone and the availability of in person appointments. I also discussed with the patient that there may be a patient responsible charge related to this service. The patient expressed understanding and verbally consented to this telephonic visit.    Interactive audio and video telecommunications were attempted between this provider and patient, however failed, due to patient having technical difficulties OR patient did not have access to video capability.  We continued and completed visit with audio only.  Some vital signs may be absent or patient reported.   Time Spent with patient on telephone encounter: 40 minutes  Subjective:   Tyler Hendricks is a 70 y.o. male who presents for Medicare Annual/Subsequent preventive examination.  Review of Systems     Cardiac Risk Factors include: advanced age (>75mn, >>73women);dyslipidemia;hypertension;family history of premature cardiovascular disease;male gender     Objective:    There were no vitals filed for this visit. There is no height or weight on file to calculate BMI.  Advanced Directives 12/06/2020  Does Patient Have a Medical Advance Directive? No  Would patient like information on creating a medical advance directive? No - Patient declined    Current Medications (verified) Outpatient Encounter Medications as of 12/06/2020  Medication Sig   amLODipine (NORVASC) 2.5 MG tablet TAKE 1 TABLET(2.5 MG) BY MOUTH DAILY   atenolol (TENORMIN) 50 MG tablet Take 1 tablet (50 mg total) by mouth daily.   bictegravir-emtricitabine-tenofovir AF (BIKTARVY) 50-200-25 MG TABS tablet TAKE 1 TABLET  BY MOUTH DAILY.   clopidogrel (PLAVIX) 75 MG tablet TAKE 1 TABLET(75 MG) BY MOUTH DAILY   HYDROcodone bit-homatropine (HYCODAN) 5-1.5 MG/5ML syrup Take 5 mLs by mouth every 6 (six) hours as needed for cough.   losartan-hydrochlorothiazide (HYZAAR) 100-25 MG tablet Take 1 tablet by mouth daily.   Multiple Vitamin (MULTIVITAMIN) tablet Take 1 tablet by mouth daily.   Nintedanib (OFEV) 100 MG CAPS    Oxycodone HCl 10 MG TABS Take 10 mg by mouth as needed.   rosuvastatin (CRESTOR) 10 MG tablet TAKE 1 TABLET(10 MG) BY MOUTH DAILY   SYNJARDY XR 12.07-998 MG TB24 TK 2 TS PO QD   VASCEPA 1 g capsule Take 2 capsules (2 g total) by mouth 2 (two) times daily.   No facility-administered encounter medications on file as of 12/06/2020.    Allergies (verified) Lopressor [metoprolol tartrate], Integrilin [eptifibatide], and Penicillins   History: Past Medical History:  Diagnosis Date   Abnormal result of cardiovascular function study, unspecified    Atherosclerotic heart disease of native coronary artery without angina pectoris    CAD (coronary artery disease)    Coronary angioplasty status    Diabetes mellitus without complication ([facility]    Essential (primary) hypertension    Gallstones    Hepatic cirrhosis (HCC)    Hepatitis C    [place]    Hyperlipidemia    Hypertension    Internal hemorrhoids    Kidney stones    Lung disease    Mixed hyperlipidemia    Palpitations    Sleep apnea    CPAP   Tubular adenoma of colon    Past Surgical History:  Procedure Laterality Date   CARDIAC CATHETERIZATION Left  04/2013   chlecystectomy     COLONOSCOPY  05/02/2013   PERCUTANEOUS CORONARY STENT INTERVENTION (PCI-S)     POLYPECTOMY     UMBILICAL HERNIA REPAIR     Family History  Problem Relation Age of Onset   Emphysema Mother        pulmonary   Heart attack Father 53   Lung cancer Sister    Esophageal cancer Brother    Esophageal cancer Brother    Colon cancer Neg Hx     Inflammatory bowel disease Neg Hx    Liver disease Neg Hx    Pancreatic cancer Neg Hx    Rectal cancer Neg Hx    Stomach cancer Neg Hx    Colon polyps Neg Hx    Social History   Socioeconomic History   Marital status: Married    Spouse name: Not on file   Number of children: 1   Years of education: Not on file   Highest education level: 10th grade  Occupational History   Occupation: retired  Tobacco Use   Smoking status: Former    Packs/day: 2.00    Years: 20.00    Pack years: 40.00    Types: Cigarettes    Start date: 1974    Quit date: 1994    Years since quitting: 28.7   Smokeless tobacco: Never  Vaping Use   Vaping Use: Never used  Substance and Sexual Activity   Alcohol use: Yes    Comment: social-occ beer   Drug use: No   Sexual activity: Yes  Other Topics Concern   Not on file  Social History Narrative   Not on file   Social Determinants of Health   Financial Resource Strain: Low Risk    Difficulty of Paying Living Expenses: Not hard at all  Food Insecurity: No Food Insecurity   Worried About Charity fundraiser in the Last Year: Never true   Ran Out of Food in the Last Year: Never true  Transportation Needs: No Transportation Needs   Lack of Transportation (Medical): No   Lack of Transportation (Non-Medical): No  Physical Activity: Sufficiently Active   Days of Exercise per Week: 5 days   Minutes of Exercise per Session: 30 min  Stress: No Stress Concern Present   Feeling of Stress : Not at all  Social Connections: Unknown   Frequency of Communication with Friends and Family: More than three times a week   Frequency of Social Gatherings with Friends and Family: More than three times a week   Attends Religious Services: Not on Electrical engineer or Organizations: Not on file   Attends Archivist Meetings: Not on file   Marital Status: Married    Tobacco Counseling Counseling given: Not Answered   Clinical  Intake:  Pre-visit preparation completed: Yes  Pain : No/denies pain     Nutritional Risks: None Diabetes: Yes CBG done?: No Did pt. bring in CBG monitor from home?: No  How often do you need to have someone help you when you read instructions, pamphlets, or other written materials from your doctor or pharmacy?: 1 - Never What is the last grade level you completed in school?: 10th grade  Diabetic? yes  Interpreter Needed?: No  Information entered by :: Lisette Abu, LPN   Activities of Daily Living In your present state of health, do you have any difficulty performing the following activities: 12/06/2020 05/15/2020  Hearing? N N  Vision? N N  Difficulty concentrating or making decisions? N N  Walking or climbing stairs? N N  Dressing or bathing? N N  Doing errands, shopping? N N  Preparing Food and eating ? N -  Using the Toilet? N -  In the past six months, have you accidently leaked urine? N -  Do you have problems with loss of bowel control? N -  Managing your Medications? N -  Managing your Finances? N -  Housekeeping or managing your Housekeeping? N -  Some recent data might be hidden    Patient Care Team: Hoyt Koch, MD as PCP - General (Internal Medicine) Jettie Booze, MD as PCP - Cardiology (Cardiology) [facility] as Consulting Physician (Ophthalmology)  Indicate any recent Medical Services you may have received from other than [facility] providers in the past year (date may be approximate).     Assessment:   This is a routine wellness examination for Tyler Hendricks.  Hearing/Vision screen Hearing Screening - Comments:: Patient denied any hearing difficulty. Vision Screening - Comments:: Annual eye exam done by [facility].  Dietary issues and exercise activities discussed: Current Exercise Habits: Home exercise routine (walks dog 3 times a day), Type of exercise: walking, Time (Minutes): 30, Frequency  (Times/Week): 5, Weekly Exercise (Minutes/Week): 150, Intensity: Moderate, Exercise limited by: respiratory conditions(s);orthopedic condition(s)   Goals Addressed   None   Depression Screen PHQ 2/9 Scores 12/06/2020 05/24/2020 12/08/2019 11/23/2018 10/04/2018 04/15/2017  PHQ - 2 Score 0 0 0 0 0 0    Fall Risk Fall Risk  12/06/2020 05/24/2020 12/08/2019 11/23/2018 10/04/2018  Falls in the past year? 0 0 0 0 0  Number falls in past yr: 0 - 0 - -  Injury with Fall? 0 - 0 - -  Risk for fall due to : No Fall Risks - - - -  Follow up Falls evaluation completed Falls evaluation completed - - -    FALL RISK PREVENTION PERTAINING TO THE HOME:  Any stairs in or around the home? No  If so, are there any without handrails? No  Home free of loose throw rugs in walkways, pet beds, electrical cords, etc? Yes  Adequate lighting in your home to reduce risk of falls? Yes   ASSISTIVE DEVICES UTILIZED TO PREVENT FALLS:  Life alert? No  Use of a cane, walker or w/c? No  Grab bars in the bathroom? Yes  Shower chair or bench in shower? No  Elevated toilet seat or a handicapped toilet? Yes   TIMED UP AND GO:  Was the test performed? No .  Length of time to ambulate 10 feet: n/a sec.   Gait steady and fast without use of assistive device  Cognitive Function: Normal cognitive status assessed by direct observation by this Nurse Health Advisor. No abnormalities found.          Immunizations Immunization History  Administered Date(s) Administered   Fluad Quad(high Dose 65+) 11/17/2018, 12/08/2019   Influenza, High Dose Seasonal PF 12/20/2017, 11/17/2018   PFIZER(Purple Top)SARS-COV-2 Vaccination 05/18/2019, 06/14/2019, 12/22/2019, 08/12/2020   Tdap 09/07/2018   Zoster Recombinat (Shingrix) 01/06/2017    TDAP status: Up to date  Flu Vaccine status: Up to date  Pneumococcal vaccine status: Up to date (patient will bring documentation)  Covid-19 vaccine status: Completed vaccines  Qualifies for  Shingles Vaccine? Yes   Zostavax completed No   Shingrix Completed?: Yes  Screening Tests Health Maintenance  Topic Date Due   Zoster Vaccines- Shingrix (2 of  2) 03/03/2017   FOOT EXAM  06/08/2019   INFLUENZA VACCINE  06/20/2021 (Originally 10/21/2020)   COVID-19 Vaccine (5 - Booster for Pfizer series) 12/13/2020   HEMOGLOBIN A1C  05/15/2021   OPHTHALMOLOGY EXAM  10/04/2021   COLONOSCOPY (Pts 45-56yr Insurance coverage will need to be confirmed)  10/31/2021   TETANUS/TDAP  09/06/2028   Hepatitis C Screening  Completed   HPV VACCINES  Aged Out    Health Maintenance  Health Maintenance Due  Topic Date Due   Zoster Vaccines- Shingrix (2 of 2) 03/03/2017   FOOT EXAM  06/08/2019    Colorectal cancer screening: Type of screening: Colonoscopy. Completed 11/01/2018. Repeat every 3 years  Lung Cancer Screening: (Low Dose CT Chest recommended if Age 70-80years, 30 pack-year currently smoking OR have quit w/in 15years.) does not qualify.   Lung Cancer Screening Referral: no  Additional Screening:  Hepatitis C Screening: does qualify; Completed yes  Vision Screening: Recommended annual ophthalmology exams for early detection of glaucoma and other disorders of the eye. Is the patient up to date with their annual eye exam?  Yes  Who is the provider or what is the name of the office in which the patient attends annual eye exams? [facility] If pt is not established with a provider, would they like to be referred to a provider to establish care? No .   Dental Screening: Recommended annual dental exams for proper oral hygiene  Community Resource Referral / Chronic Care Management: CRR required this visit?  No   CCM required this visit?  No      Plan:     I have personally reviewed and noted the following in the patient's chart:   Medical and social history Use of alcohol, tobacco or illicit drugs  Current medications and supplements including opioid prescriptions. Patient  is currently taking opioid prescriptions. Information provided to patient regarding non-opioid alternatives. Patient advised to discuss non-opioid treatment plan with their provider. Functional ability and status Nutritional status Physical activity Advanced directives List of other physicians Hospitalizations, surgeries, and ER visits in previous 12 months Vitals Screenings to include cognitive, depression, and falls Referrals and appointments  In addition, I have reviewed and discussed with patient certain preventive protocols, quality metrics, and best practice recommendations. A written personalized care plan for preventive services as well as general preventive health recommendations were provided to patient.     SSheral Flow LPN   [id number]  Nurse Notes:  Patient is cogitatively intact. There were no vitals filed for this visit. There is no height or weight on file to calculate BMI. Patient stated that he has no issues with gait or balance; does not use any assistive devices. Hearing Screening - Comments:: Patient denied any hearing difficulty. [facility]ening - Comments:: [facility]

## 2020-12-08 DIAGNOSIS — Z23 Encounter for immunization: Secondary | ICD-10-CM | POA: Diagnosis not present

## 2020-12-09 ENCOUNTER — Encounter: Payer: Self-pay | Admitting: Internal Medicine

## 2020-12-09 LAB — LIPID PANEL
Cholesterol: 149 mg/dL (ref <200)
HDL: 76 mg/dL (ref >=40)
LDL Cholesterol (Calc): 59 mg/dL (calc)
Non-HDL Cholesterol (Calc): 73 mg/dL (calc) (ref <130)
Total CHOL/HDL Ratio: 2 (calc) (ref <5.0)
Triglycerides: 62 mg/dL (ref <150)

## 2020-12-09 LAB — COMPREHENSIVE METABOLIC PANEL
AG Ratio: 1.6 (calc) (ref 1.0–2.5)
ALT: 23 U/L (ref 9–46)
AST: 33 U/L (ref 10–35)
Albumin: 4 g/dL (ref 3.6–5.1)
Alkaline phosphatase (APISO): 105 U/L (ref 35–144)
BUN/Creatinine Ratio: 20 (calc) (ref 6–22)
BUN: 14 mg/dL (ref 7–25)
CO2: 29 mmol/L (ref 20–32)
Calcium: 9.5 mg/dL (ref 8.6–10.3)
Chloride: 105 mmol/L (ref 98–110)
Creat: 0.69 mg/dL — ABNORMAL LOW (ref 0.70–1.28)
Globulin: 2.5 g/dL (calc) (ref 1.9–3.7)
Glucose, Bld: 121 mg/dL — ABNORMAL HIGH (ref 65–99)
Potassium: 4.3 mmol/L (ref 3.5–5.3)
Sodium: 141 mmol/L (ref 135–146)
Total Bilirubin: 3.2 mg/dL — ABNORMAL HIGH (ref 0.2–1.2)
Total Protein: 6.5 g/dL (ref 6.1–8.1)

## 2020-12-09 LAB — HIV-1 RNA QUANT-NO REFLEX-BLD
HIV 1 RNA Quant: NOT DETECTED Copies/mL
HIV-1 RNA Quant, Log: NOT DETECTED Log cps/mL

## 2020-12-13 ENCOUNTER — Other Ambulatory Visit: Payer: Medicare Other

## 2020-12-16 ENCOUNTER — Other Ambulatory Visit: Payer: Self-pay | Admitting: Family

## 2020-12-16 ENCOUNTER — Other Ambulatory Visit (HOSPITAL_COMMUNITY): Payer: Self-pay

## 2020-12-16 DIAGNOSIS — B20 Human immunodeficiency virus [HIV] disease: Secondary | ICD-10-CM

## 2020-12-16 MED ORDER — BIKTARVY 50-200-25 MG PO TABS
1.0000 | ORAL_TABLET | Freq: Every day | ORAL | 0 refills | Status: DC
  Filled 2020-12-16: qty 30, 30d supply, fill #0

## 2020-12-23 ENCOUNTER — Other Ambulatory Visit (HOSPITAL_COMMUNITY): Payer: Self-pay

## 2020-12-27 ENCOUNTER — Other Ambulatory Visit: Payer: Self-pay

## 2020-12-27 ENCOUNTER — Other Ambulatory Visit (HOSPITAL_COMMUNITY): Payer: Self-pay

## 2020-12-27 ENCOUNTER — Ambulatory Visit (INDEPENDENT_AMBULATORY_CARE_PROVIDER_SITE_OTHER): Payer: Medicare Other | Admitting: Family

## 2020-12-27 ENCOUNTER — Encounter: Payer: Self-pay | Admitting: Family

## 2020-12-27 VITALS — BP 139/80 | HR 64 | Temp 98.1°F | Wt 204.0 lb

## 2020-12-27 DIAGNOSIS — I25118 Atherosclerotic heart disease of native coronary artery with other forms of angina pectoris: Secondary | ICD-10-CM | POA: Diagnosis not present

## 2020-12-27 DIAGNOSIS — B20 Human immunodeficiency virus [HIV] disease: Secondary | ICD-10-CM

## 2020-12-27 DIAGNOSIS — Z0000 Encounter for general adult medical examination without abnormal findings: Secondary | ICD-10-CM

## 2020-12-27 MED ORDER — BIKTARVY 50-200-25 MG PO TABS
1.0000 | ORAL_TABLET | Freq: Every day | ORAL | 11 refills | Status: DC
  Filled 2020-12-27 – 2021-01-09 (×2): qty 30, 30d supply, fill #0
  Filled 2021-02-06: qty 30, 30d supply, fill #1
  Filled 2021-03-10: qty 30, 30d supply, fill #2
  Filled 2021-04-15: qty 30, 30d supply, fill #3
  Filled 2021-05-13: qty 30, 30d supply, fill #4
  Filled 2021-06-09: qty 30, 30d supply, fill #5
  Filled 2021-07-03: qty 30, 30d supply, fill #6
  Filled 2021-08-07: qty 30, 30d supply, fill #7
  Filled 2021-09-09: qty 30, 30d supply, fill #8
  Filled 2021-10-09: qty 30, 30d supply, fill #9
  Filled 2021-11-04: qty 30, 30d supply, fill #10
  Filled 2021-12-02: qty 30, 30d supply, fill #11

## 2020-12-27 NOTE — Assessment & Plan Note (Signed)
   Discussed importance of safe sexual practice and condom usage. Condoms offered.   Working on completing routine dental care.

## 2020-12-27 NOTE — Assessment & Plan Note (Signed)
Tyler Hendricks continues to have well controlled virus with good adherence and tolerance to his ART regimen of Biktarvy. No signs/symptoms of opportunistic infection. Reviewed lab work and discussed plan of care. Continue current dose of Biktarvy. Plan for follow up in 6 months or sooner if needed with lab work 1-2 weeks prior to appointment.

## 2020-12-27 NOTE — Patient Instructions (Signed)
Nice to see you.   Continue to take your medications daily as prescribed.  Refills have been sent to the pharmacy.  Plan for follow up in 6 months of sooner if needed with lab work 1-2 weeks prior to appointment.   Have a great day and stay safe!

## 2020-12-27 NOTE — Progress Notes (Signed)
Brief Narrative   Patient ID: Kire Ferg, male    DOB: Mar 08, 1951, 70 y.o.   MRN: [id number]  Mr. Jacinto is a 70 y/o caucasian male diagnosed with HIV disease in 1989 with risk factor being IV drug use. Initial viral load is unknown with CD4 nadir of 510. No history of opportunistic infection. [id number] negative. Previous ART history with Zerit/Stauvadine-Epivir, Atripla, Complera and Odefsey.   Subjective:    Chief Complaint  Patient presents with   Follow-up    B20    HPI:  Chesney Klimaszewski is a 70 y.o. male with HIV disease last seen on 05/24/20 with well controlled virus and good adherence and tolerance to his ART regimen of Biktarvy. Viral load at the time was undetectable with CD4 count of 789. Most recent lab work completed on 12/06/20 with viral load that remains undetectable and kidney function, liver function, electrolytes within normal ranges. Here today for routine follow up.  Mr. Aguinaldo continues to take his Biktarvy daily as prescribed with no adverse side effects. Overall feeling well today with no new concerns/complaints. Denies fevers, chills, night sweats, headaches, changes in vision, neck pain/stiffness, nausea, diarrhea, vomiting, lesions or rashes.  Mr. Cabello has no problems obtaining medication from the pharmacy and remains covered by Medicare. No feelings of being down, depressed or hopeless. No current recreational or illicit drug use, tobacco use and occasional alcohol consumption. Condoms offered.    Allergies  Allergen Reactions   Lopressor [Metoprolol Tartrate] Rash and Other (See Comments)    Bleeding (non-specific)    Integrilin [Eptifibatide] Other (See Comments)    Bleeding (non-specific)   Penicillins Diarrhea and Rash      Outpatient Medications Prior to Visit  Medication Sig Dispense Refill   amLODipine (NORVASC) 2.5 MG tablet TAKE 1 TABLET(2.5 MG) BY MOUTH DAILY 90 tablet 3   atenolol (TENORMIN) 50 MG tablet Take 1 tablet (50 mg total) by mouth  daily. 90 tablet 3   clopidogrel (PLAVIX) 75 MG tablet TAKE 1 TABLET(75 MG) BY MOUTH DAILY 90 tablet 3   HYDROcodone bit-homatropine (HYCODAN) 5-1.5 MG/5ML syrup Take 5 mLs by mouth every 6 (six) hours as needed for cough. 240 mL 0   losartan-hydrochlorothiazide (HYZAAR) 100-25 MG tablet Take 1 tablet by mouth daily. 90 tablet 3   Multiple Vitamin (MULTIVITAMIN) tablet Take 1 tablet by mouth daily.     Nintedanib (OFEV) 100 MG CAPS      Oxycodone HCl 10 MG TABS Take 10 mg by mouth as needed.     rosuvastatin (CRESTOR) 10 MG tablet TAKE 1 TABLET(10 MG) BY MOUTH DAILY 90 tablet 3   SYNJARDY XR 12.07-998 MG TB24 TK 2 TS PO QD 180 tablet 3   VASCEPA 1 g capsule Take 2 capsules (2 g total) by mouth 2 (two) times daily. 360 capsule 3   bictegravir-emtricitabine-tenofovir AF (BIKTARVY) 50-200-25 MG TABS tablet TAKE 1 TABLET BY MOUTH DAILY. 30 tablet 0   No facility-administered medications prior to visit.     Past Medical History:  Diagnosis Date   Abnormal result of cardiovascular function study, unspecified    Atherosclerotic heart disease of native coronary artery without angina pectoris    CAD (coronary artery disease)    Coronary angioplasty status    Diabetes mellitus without complication ([place])    Essential (primary) hypertension    Gallstones    Hepatic cirrhosis (HCC)    Hepatitis C    HIV infection (HCC)    Hyperlipidemia    Hypertension  Internal hemorrhoids    Kidney stones    Lung disease    Mixed hyperlipidemia    Palpitations    Sleep apnea    CPAP   Tubular adenoma of colon      Past Surgical History:  Procedure Laterality Date   CARDIAC CATHETERIZATION Left 04/2013   chlecystectomy     COLONOSCOPY  05/02/2013   PERCUTANEOUS CORONARY STENT INTERVENTION (PCI-S)     POLYPECTOMY     UMBILICAL HERNIA REPAIR        Review of Systems  Constitutional:  Negative for appetite change, chills, fatigue, fever and unexpected weight change.  Eyes:  Negative for  visual disturbance.  Respiratory:  Negative for cough, chest tightness, shortness of breath and wheezing.   Cardiovascular:  Negative for chest pain and leg swelling.  Gastrointestinal:  Negative for abdominal pain, constipation, diarrhea, nausea and vomiting.  Genitourinary:  Negative for dysuria, flank pain, frequency, genital sores, hematuria and urgency.  Skin:  Negative for rash.  Allergic/Immunologic: Negative for immunocompromised state.  Neurological:  Negative for dizziness and headaches.     Objective:    BP 139/80   Pulse 64   Temp 98.1 F (36.7 C) (Oral)   Wt 204 lb (92.5 kg)   BMI 28.45 kg/m  Nursing note and vital signs reviewed.  Physical Exam Constitutional:      General: He is not in acute distress.    Appearance: He is well-developed.  Eyes:     Conjunctiva/sclera: Conjunctivae normal.  Cardiovascular:     Rate and Rhythm: Normal rate and regular rhythm.     Heart sounds: Normal heart sounds. No murmur heard.   No friction rub. No gallop.  Pulmonary:     Effort: Pulmonary effort is normal. No respiratory distress.     Breath sounds: Normal breath sounds. No wheezing or rales.  Chest:     Chest wall: No tenderness.  Abdominal:     General: Bowel sounds are normal.     Palpations: Abdomen is soft.     Tenderness: There is no abdominal tenderness.  Musculoskeletal:     Cervical back: Neck supple.  Lymphadenopathy:     Cervical: No cervical adenopathy.  Skin:    General: Skin is warm and dry.     Findings: No rash.  Neurological:     Mental Status: He is alert and oriented to person, place, and time.  Psychiatric:        Behavior: Behavior normal.        Thought Content: Thought content normal.        Judgment: Judgment normal.     Depression screen [facility] 2/9 12/27/2020 12/06/2020 05/24/2020 12/08/2019 11/23/2018  Decreased Interest 0 0 0 0 0  Down, Depressed, Hopeless 0 0 0 0 0  PHQ - 2 Score 0 0 0 0 0       Assessment & Plan:    Patient Active  Problem List   Diagnosis Date Noted   Neural foraminal stenosis of cervical spine 01/03/2020   Chronic chest wall pain 11/16/2019   Routine general medical examination at a health care facility 11/16/2019   Hyperlipidemia LDL goal <70 11/15/2019   Benign prostatic hyperplasia without lower urinary tract symptoms 11/15/2019   Interstitial pulmonary disease ([place]) 07/18/2019   Chronic bilateral low back pain without sciatica 03/02/2019   Muscle cramps 03/02/2019   Cirrhosis of liver without ascites ([place]) 01/08/2019   Secondary esophageal varices without bleeding ([place]) 01/08/2019   Intestinal metaplasia of  gastric mucosa 01/08/2019   Hepatitis B core antibody positive 01/08/2019   Chronic cough 06/24/2018   Healthcare maintenance 05/11/2018   Hx of colonic polyp 04/27/2018   Change in bowel habit 04/27/2018   Ventral hernia without obstruction or gangrene 04/27/2018   Thrombocytopenia ([place]) 04/27/2018   Hypersplenism 04/27/2018   Positive hepatitis C antibody test 04/27/2018   Coronary artery disease without angina pectoris 02/25/2018   Obstructive sleep apnea 10/25/2017   Extrinsic asthma without complication 22/04/5425   Vitamin D deficiency 08/25/2017   HIV disease ([place]) 04/15/2017   Primary osteoarthritis of both knees 04/15/2017   Type 2 diabetes mellitus with hyperglycemia, without long-term current use of insulin ([place]) 04/15/2017   Essential hypertension 04/15/2017     Problem List Items Addressed This Visit       Other   HIV disease [facility])    Mr. Selvey continues to have well controlled virus with good adherence and tolerance to his ART regimen of Biktarvy. No signs/symptoms of opportunistic infection. Reviewed lab work and discussed plan of care. Continue current dose of Biktarvy. Plan for follow up in 6 months or sooner if needed with lab work 1-2 weeks prior to appointment.       Relevant Medications   bictegravir-emtricitabine-tenofovir AF (BIKTARVY) 50-200-25 MG TABS  tablet   Other Relevant Orders   HIV-1 RNA quant-no reflex-bld   T-helper cell (CD4)- ([facility] clinic only)   Comprehensive metabolic panel   Healthcare maintenance    Discussed importance of safe sexual practice and condom usage. Condoms offered.  Working on completing routine dental care.         I am having Rayjon Wery maintain his multivitamin, amLODipine, atenolol, clopidogrel, losartan-hydrochlorothiazide, rosuvastatin, Vascepa, Synjardy XR, Ofev, HYDROcodone bit-homatropine, Oxycodone HCl, and Biktarvy.   Meds ordered this encounter  Medications   bictegravir-emtricitabine-tenofovir AF (BIKTARVY) 50-200-25 MG TABS tablet    Sig: Take 1 tablet by mouth daily.    Dispense:  30 tablet    Refill:  11    Order Specific Question:   Supervising Provider    Answer:   Carlyle Basques [[id number]]      Follow-up: Return in about 6 months (around 06/27/2021).   [name], MSN, FNP-C Nurse Practitioner [facility] for Infectious Disease [place] number: [phone]

## 2021-01-01 ENCOUNTER — Other Ambulatory Visit: Payer: Self-pay | Admitting: Pulmonary Disease

## 2021-01-01 NOTE — Telephone Encounter (Signed)
Checked pt's med list and see that pt has been on hycodan. Dr. Vaughan Browner, please advise if you are okay refilling med. Rx has been pended.

## 2021-01-08 NOTE — Telephone Encounter (Signed)
Dr. Mannam, can you please advise? Thanks!  

## 2021-01-09 ENCOUNTER — Other Ambulatory Visit (HOSPITAL_COMMUNITY): Payer: Self-pay

## 2021-01-10 ENCOUNTER — Other Ambulatory Visit (HOSPITAL_COMMUNITY): Payer: Self-pay

## 2021-01-10 ENCOUNTER — Telehealth: Payer: Self-pay | Admitting: Pulmonary Disease

## 2021-01-10 MED ORDER — HYDROCODONE BIT-HOMATROP MBR 5-1.5 MG/5ML PO SOLN: 5.0000 mL | Freq: Four times a day (QID) | ORAL | 0 refills | Status: DC | PRN

## 2021-01-10 NOTE — Telephone Encounter (Signed)
Called and spoke with pt letting him know that Dr. Vaughan Browner refilled his hycodan for him and he verbalized understanding. Nothing further needed.

## 2021-01-10 NOTE — Telephone Encounter (Signed)
Pt states he called specialty pharmacy for refillOfev- they said they contacted our office stating they needed permission to refill-with no response. States it was sent in August. Pt needs medication asapPlease advise Benzie [phone]

## 2021-01-10 NOTE — Telephone Encounter (Signed)
I have renewed his medication.

## 2021-01-10 NOTE — Telephone Encounter (Signed)
Devki is out of the office until next week. Can you please send in refills for patient? Can be escribed to [facility].

## 2021-01-10 NOTE — Telephone Encounter (Signed)
Called patient to let him know that we will send in the refill for him. Per patient, they were wanting someone from the office to speak to the pharmacist at [facility]. I advised him that I would give them a call.   Called [facility] and spoke with the pharmacist. Provided a verbal for the Ofev.   Called patient to let him know that this has been done.   Nothing further needed.

## 2021-01-14 ENCOUNTER — Other Ambulatory Visit (HOSPITAL_COMMUNITY): Payer: Self-pay

## 2021-01-30 DIAGNOSIS — Z23 Encounter for immunization: Secondary | ICD-10-CM | POA: Diagnosis not present

## 2021-02-06 ENCOUNTER — Other Ambulatory Visit (HOSPITAL_COMMUNITY): Payer: Self-pay

## 2021-02-10 ENCOUNTER — Telehealth: Payer: Self-pay | Admitting: Pulmonary Disease

## 2021-02-10 NOTE — Telephone Encounter (Signed)
ATC LVMTCB X1  

## 2021-02-11 ENCOUNTER — Other Ambulatory Visit (HOSPITAL_COMMUNITY): Payer: Self-pay

## 2021-02-11 MED ORDER — HYDROCODONE BIT-HOMATROP MBR 5-1.5 MG/5ML PO SOLN: 5.0000 mL | Freq: Four times a day (QID) | ORAL | 0 refills | Status: DC | PRN

## 2021-02-11 NOTE — Telephone Encounter (Signed)
Patient is returning phone call. Patient phone number is [phone].

## 2021-02-11 NOTE — Telephone Encounter (Signed)
Spoke with the pt  He is requesting refill on hycodan  He states his cough is unchanged since last visit  His last rx for this was sent 01/10/21 #240 ml Dr. Vaughan Browner, please advise if you will send in refill for him- [facility]  Thanks!

## 2021-02-11 NOTE — Telephone Encounter (Signed)
Ok. I refilled the cough medication

## 2021-02-11 NOTE — Telephone Encounter (Signed)
Notified pt that refill had been sent it. Nothing further needed at this time.

## 2021-02-17 DIAGNOSIS — H524 Presbyopia: Secondary | ICD-10-CM | POA: Diagnosis not present

## 2021-02-17 DIAGNOSIS — H0288B Meibomian gland dysfunction left eye, upper and lower eyelids: Secondary | ICD-10-CM | POA: Diagnosis not present

## 2021-02-17 DIAGNOSIS — H0288A Meibomian gland dysfunction right eye, upper and lower eyelids: Secondary | ICD-10-CM | POA: Diagnosis not present

## 2021-02-17 DIAGNOSIS — H04123 Dry eye syndrome of bilateral lacrimal glands: Secondary | ICD-10-CM | POA: Diagnosis not present

## 2021-02-17 DIAGNOSIS — E119 Type 2 diabetes mellitus without complications: Secondary | ICD-10-CM | POA: Diagnosis not present

## 2021-02-17 DIAGNOSIS — H5213 Myopia, bilateral: Secondary | ICD-10-CM | POA: Diagnosis not present

## 2021-02-17 DIAGNOSIS — H353132 Nonexudative age-related macular degeneration, bilateral, intermediate dry stage: Secondary | ICD-10-CM | POA: Diagnosis not present

## 2021-02-17 DIAGNOSIS — H35033 Hypertensive retinopathy, bilateral: Secondary | ICD-10-CM | POA: Diagnosis not present

## 2021-02-17 DIAGNOSIS — H2513 Age-related nuclear cataract, bilateral: Secondary | ICD-10-CM | POA: Diagnosis not present

## 2021-02-17 DIAGNOSIS — H0102A Squamous blepharitis right eye, upper and lower eyelids: Secondary | ICD-10-CM | POA: Diagnosis not present

## 2021-02-17 DIAGNOSIS — H52222 Regular astigmatism, left eye: Secondary | ICD-10-CM | POA: Diagnosis not present

## 2021-02-17 DIAGNOSIS — H0102B Squamous blepharitis left eye, upper and lower eyelids: Secondary | ICD-10-CM | POA: Diagnosis not present

## 2021-03-03 IMAGING — US US HEPATIC LIVER DOPPLER
1 series · 13 of 25 positions shown · non-contrast
Comparison: Prior abdominal ultrasound 09/22/2017

CLINICAL DATA: Hepatic cirrhosis. Evaluate for portal hypertension
and hepatocellular carcinoma.

EXAM:
DUPLEX ULTRASOUND OF LIVER
TECHNIQUE: Color and duplex Doppler ultrasound was performed to evaluate the
hepatic in-flow and out-flow vessels.

[Series 1: us hepatic liver doppler · 13 of 98 slices shown]
[im 1/98]
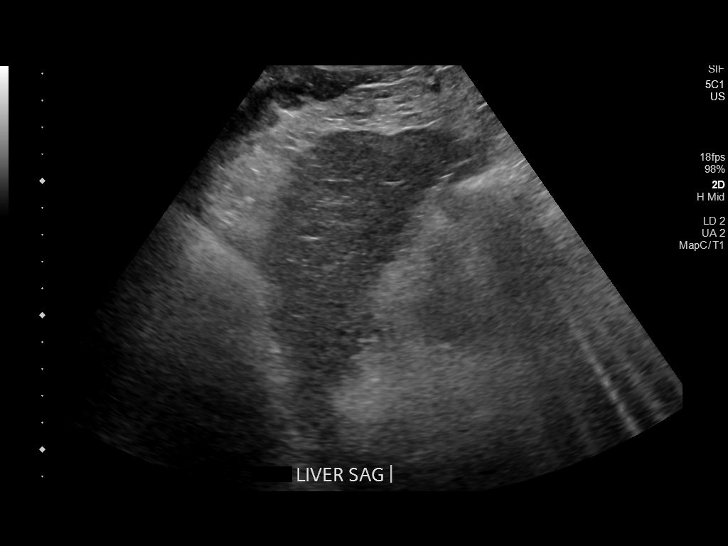
[im 9/98]
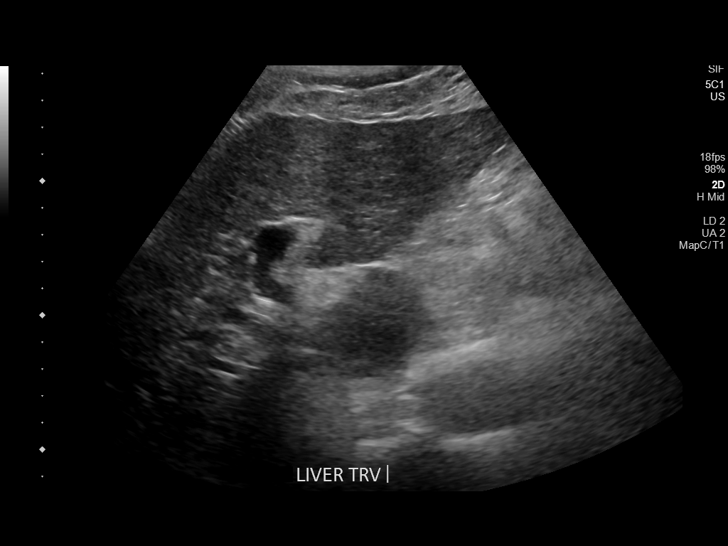
[im 17/98]
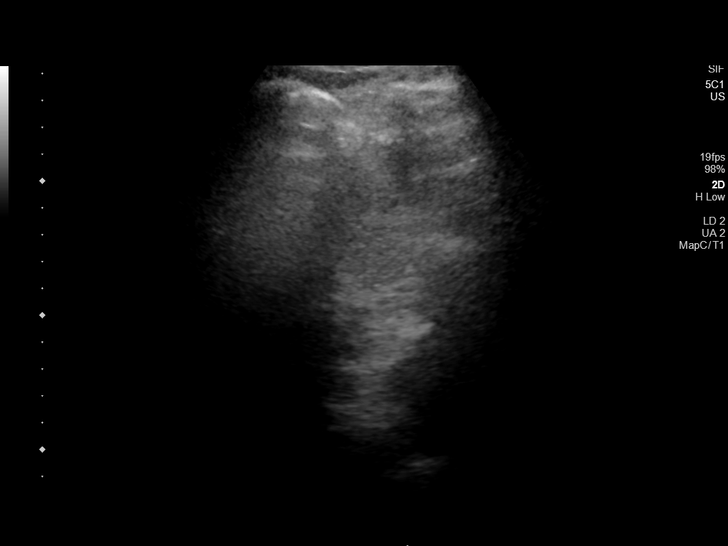
[im 25/98]
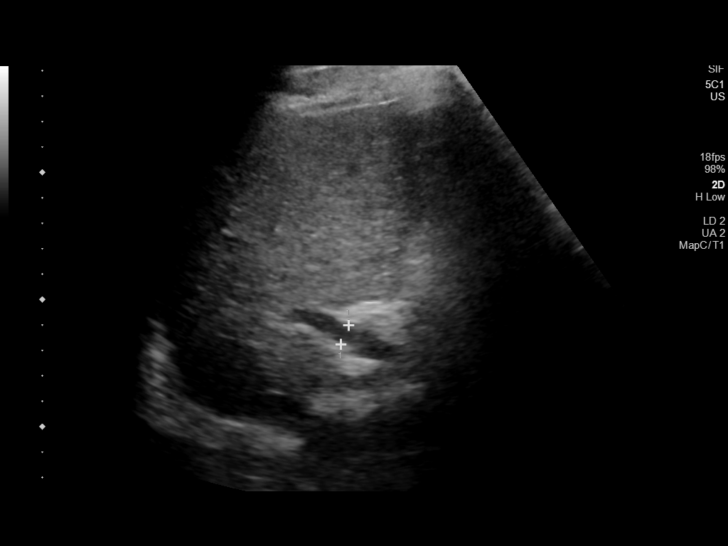
[im 33/98]
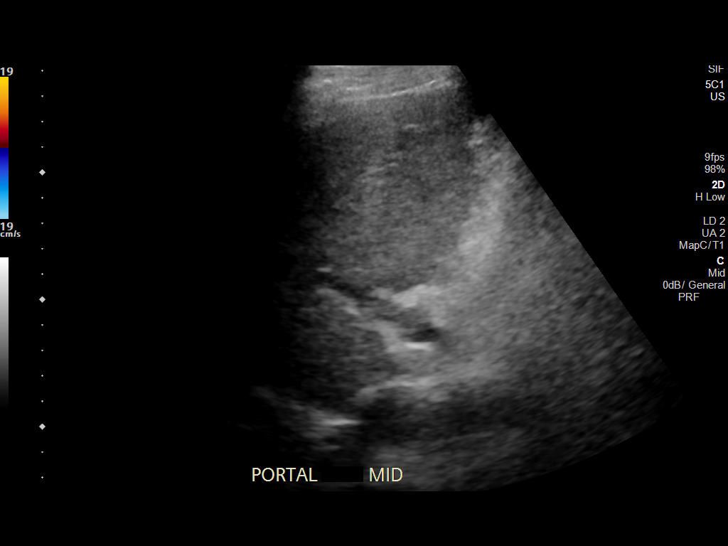
[im 41/98]
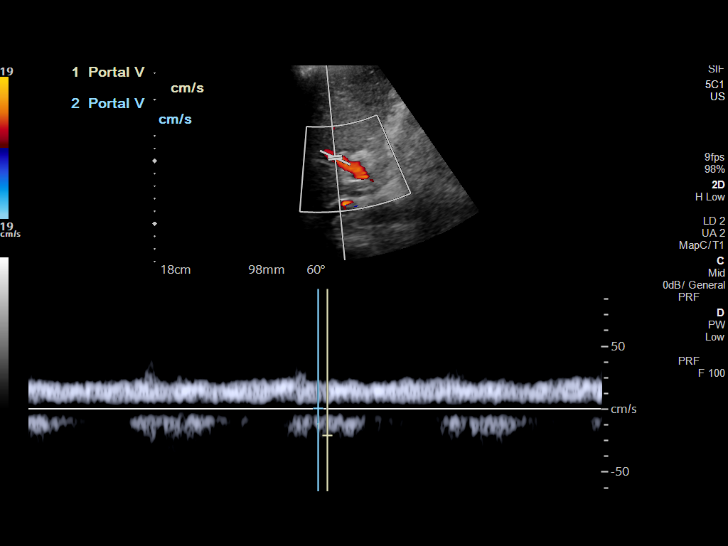
[im 49/98]
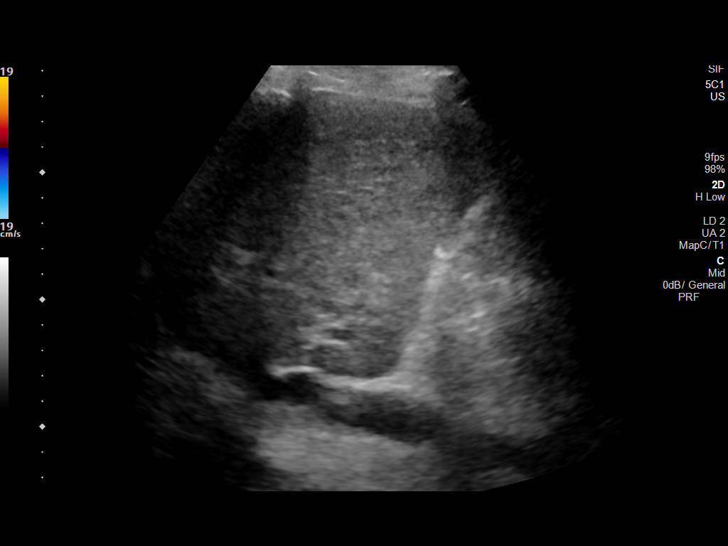
[im 57/98]
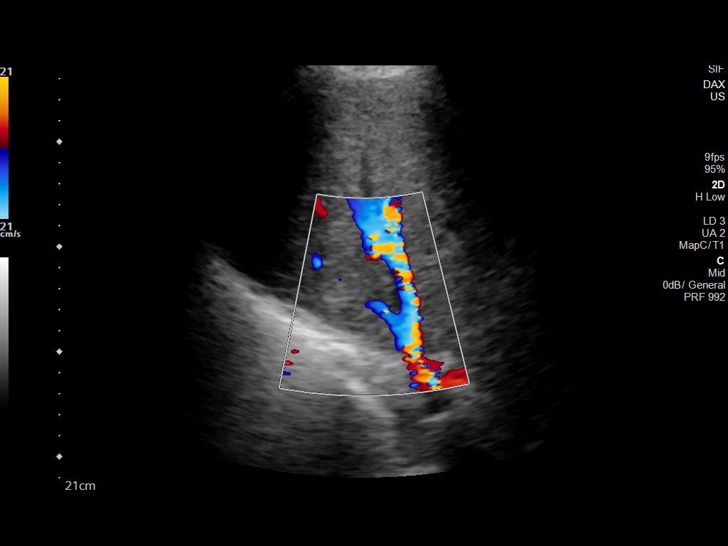
[im 65/98]
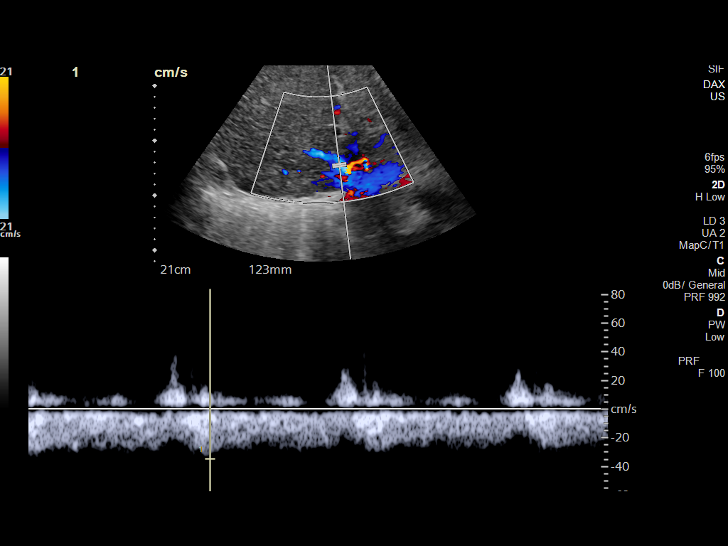
[im 73/98]
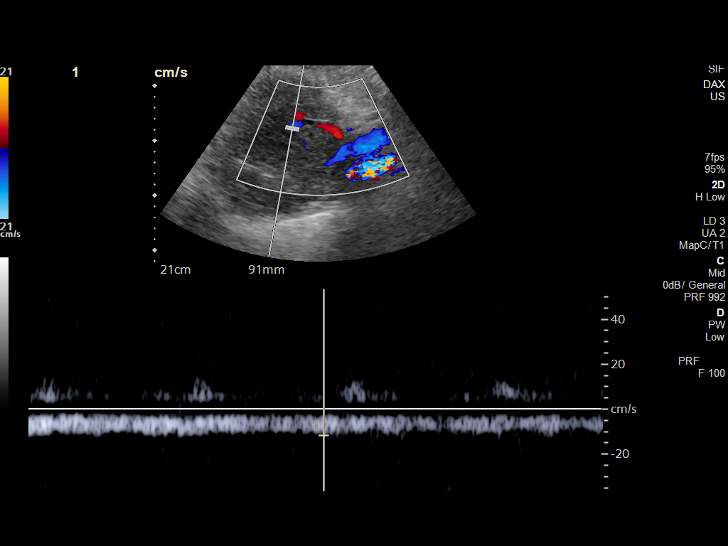
[im 81/98]
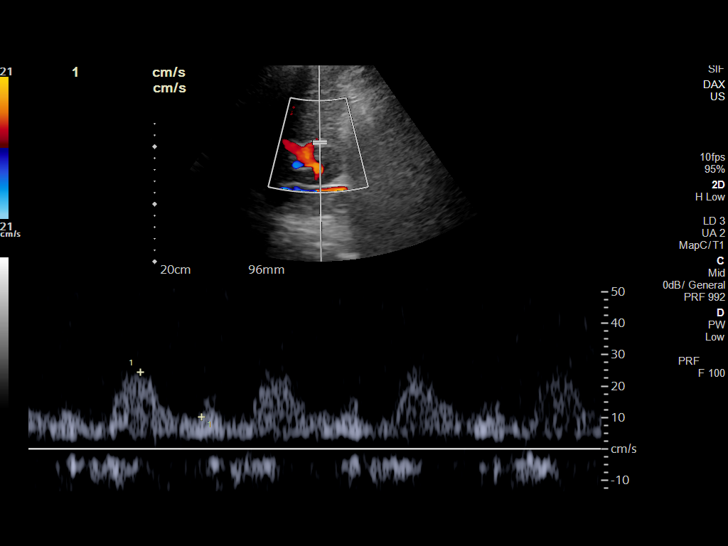
[im 89/98]
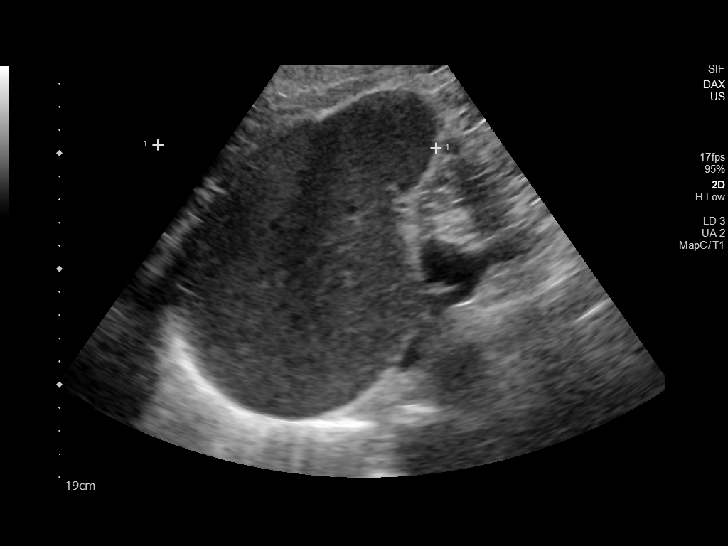
[im 98/98]
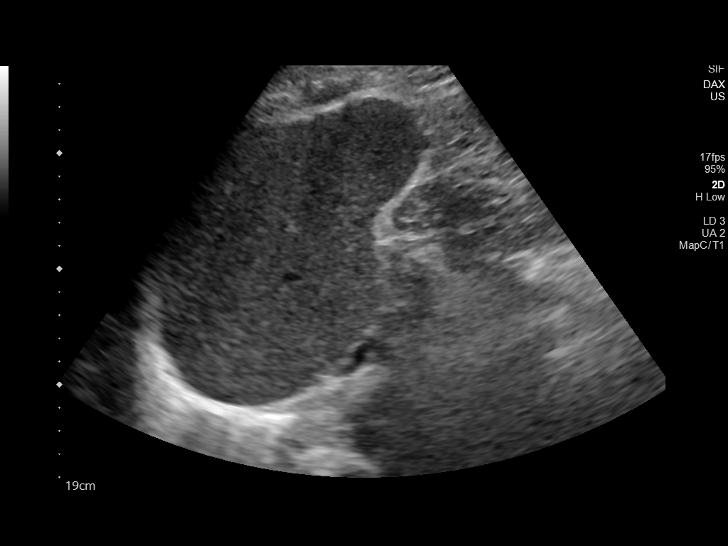

[13 of 25 positions shown; findings below may reference images not displayed]

FINDINGS: Liver: The liver parenchyma is diffusely heterogeneous with
coarsening of the echotexture. The liver contour is nodular
consistent with cirrhotic morphology. No discrete hepatic lesion is
identified. No intrahepatic or extrahepatic biliary ductal
dilatation.

Main Portal Vein size: 0.8 cm cm

Portal Vein Velocities

Main Prox:  30 cm/sec

Main Mid: 27 cm/sec

Main Dist:  21 cm/sec
Right: 29 cm/sec
Left: 28 cm/sec

Hepatic Vein Velocities

Right:  32 cm/sec

Middle:  29 cm/sec

Left:  21 cm/sec

IVC: Present and patent with normal respiratory phasicity.

Hepatic Artery Velocity:  24 cm/sec

Splenic Vein Velocity:  23 cm/sec

Spleen: 12 cm x 17 cm x 9 cm with a total volume of 955 cm^3 (411
cm^3 is upper limit normal)

Portal Vein Occlusion/Thrombus: No

Splenic Vein Occlusion/Thrombus: No

Ascites: None

Varices: None
IMPRESSION: 1. Hepatic cirrhosis with splenomegaly suggesting an element of
underlying portal hypertension.
2. Portal veins remain patent with normal directional flow.
3. No discrete liver lesion or mass identified.
4. No evidence of ascites.

## 2021-03-06 ENCOUNTER — Telehealth: Payer: Self-pay | Admitting: Pulmonary Disease

## 2021-03-06 MED ORDER — HYDROCODONE BIT-HOMATROP MBR 5-1.5 MG/5ML PO SOLN: 5.0000 mL | Freq: Four times a day (QID) | ORAL | 0 refills | Status: DC | PRN

## 2021-03-06 NOTE — Telephone Encounter (Signed)
Called and spoke with patient. He is aware that the cough syrup has been sent to his pharmacy.   Nothing further needed at time of call.

## 2021-03-06 NOTE — Telephone Encounter (Signed)
I called the patient and he reports that he is having a productive cough that is clear thick sputum x 3 days. He has took the previous cough medication and has not taken anything else. He denies any other symptoms at this time. He reports the cough is keeping him up at night and he is having trouble talking during the day.   He wants to know if you are willing to send in something for cough [facility] [address].

## 2021-03-06 NOTE — Telephone Encounter (Signed)
Hycodan refill has been called in to pharmacy

## 2021-03-10 ENCOUNTER — Other Ambulatory Visit (HOSPITAL_COMMUNITY): Payer: Self-pay

## 2021-03-12 DIAGNOSIS — Z79891 Long term (current) use of opiate analgesic: Secondary | ICD-10-CM | POA: Diagnosis not present

## 2021-03-18 ENCOUNTER — Other Ambulatory Visit (HOSPITAL_COMMUNITY): Payer: Self-pay

## 2021-03-30 IMAGING — DX DG CHEST 2V
2 series · 2 of 2 positions shown · non-contrast
Comparison: None.

CLINICAL DATA: Cough.

EXAM:
CHEST - 2 VIEW

[dg chest 2 view (1 of 2)]
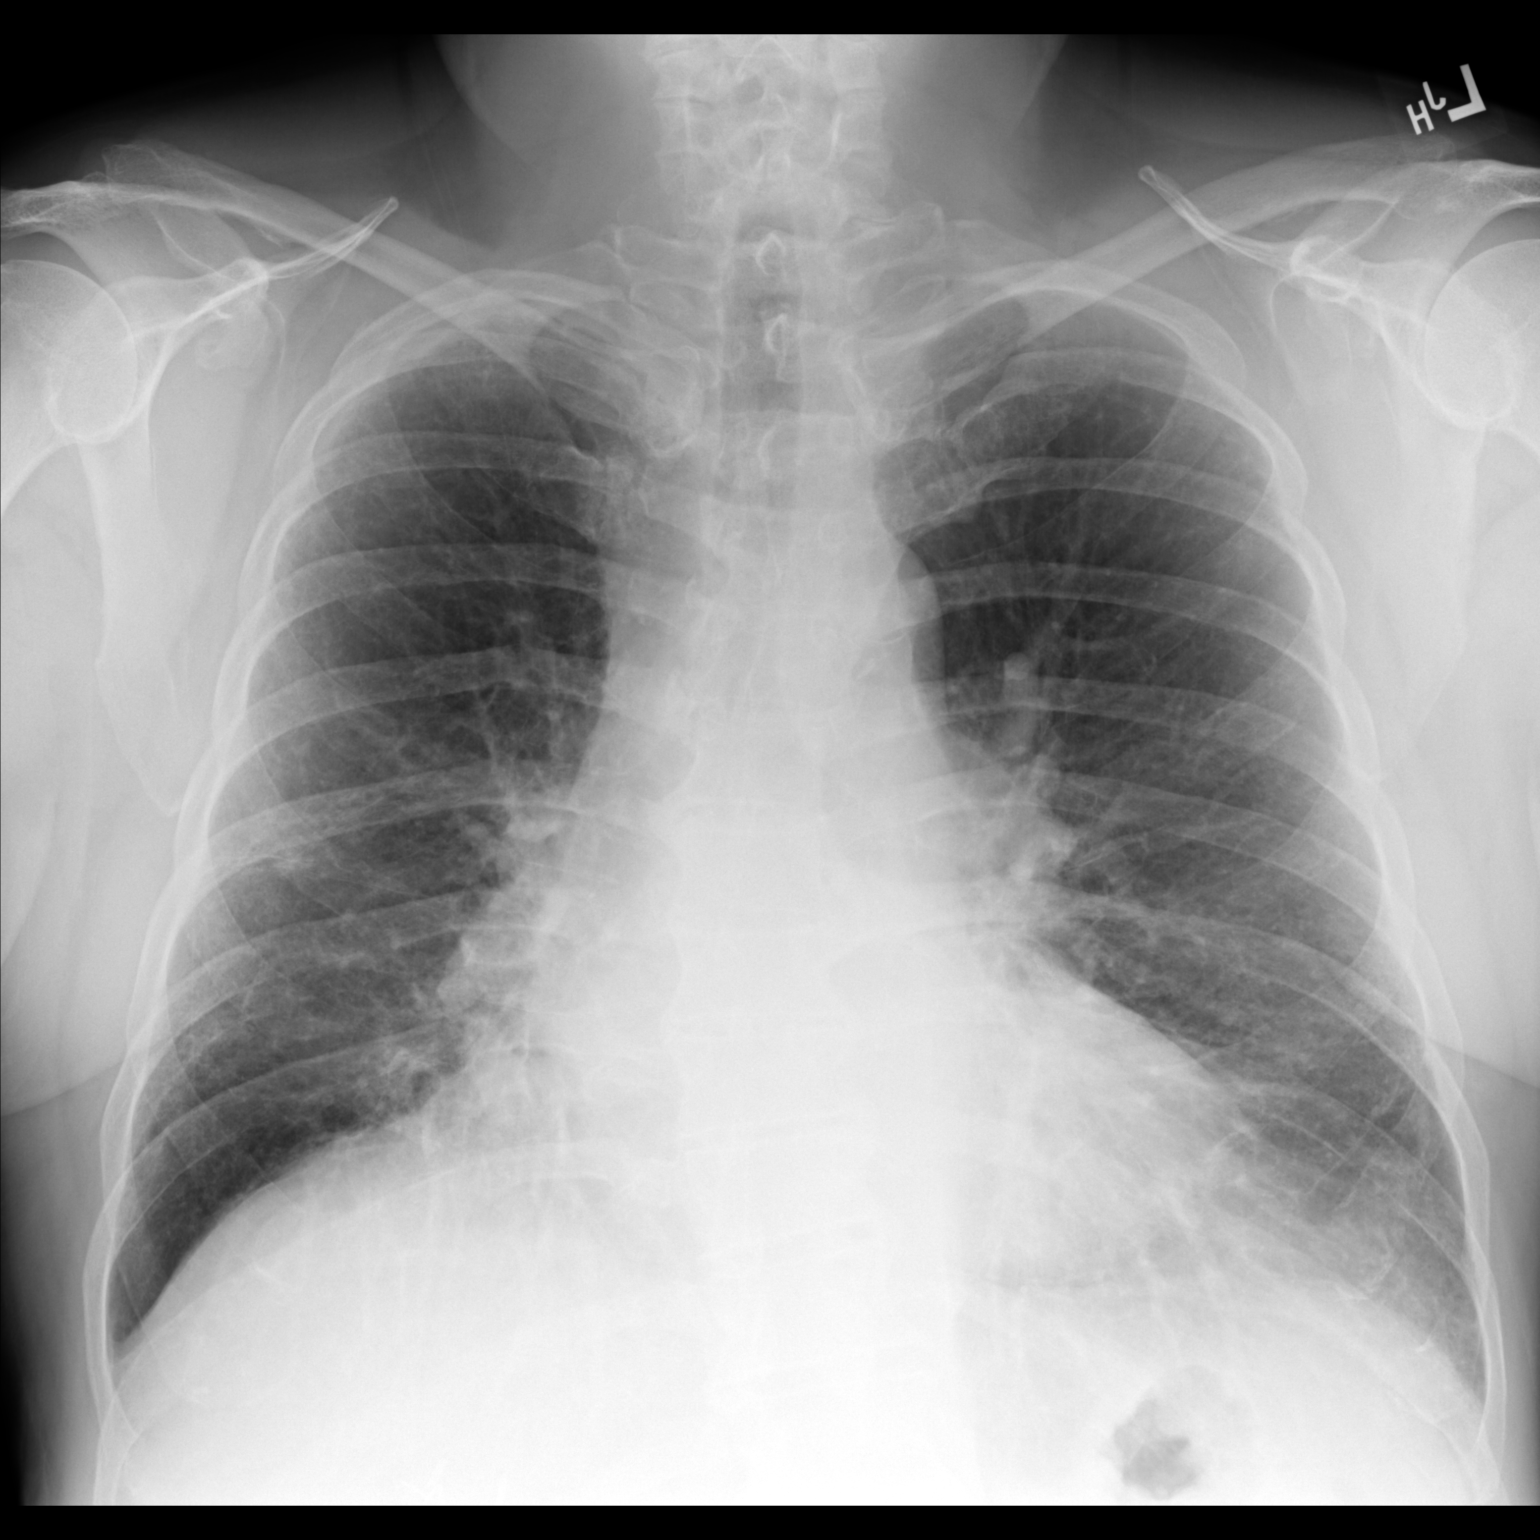

[dg chest 2 view (2 of 2)]
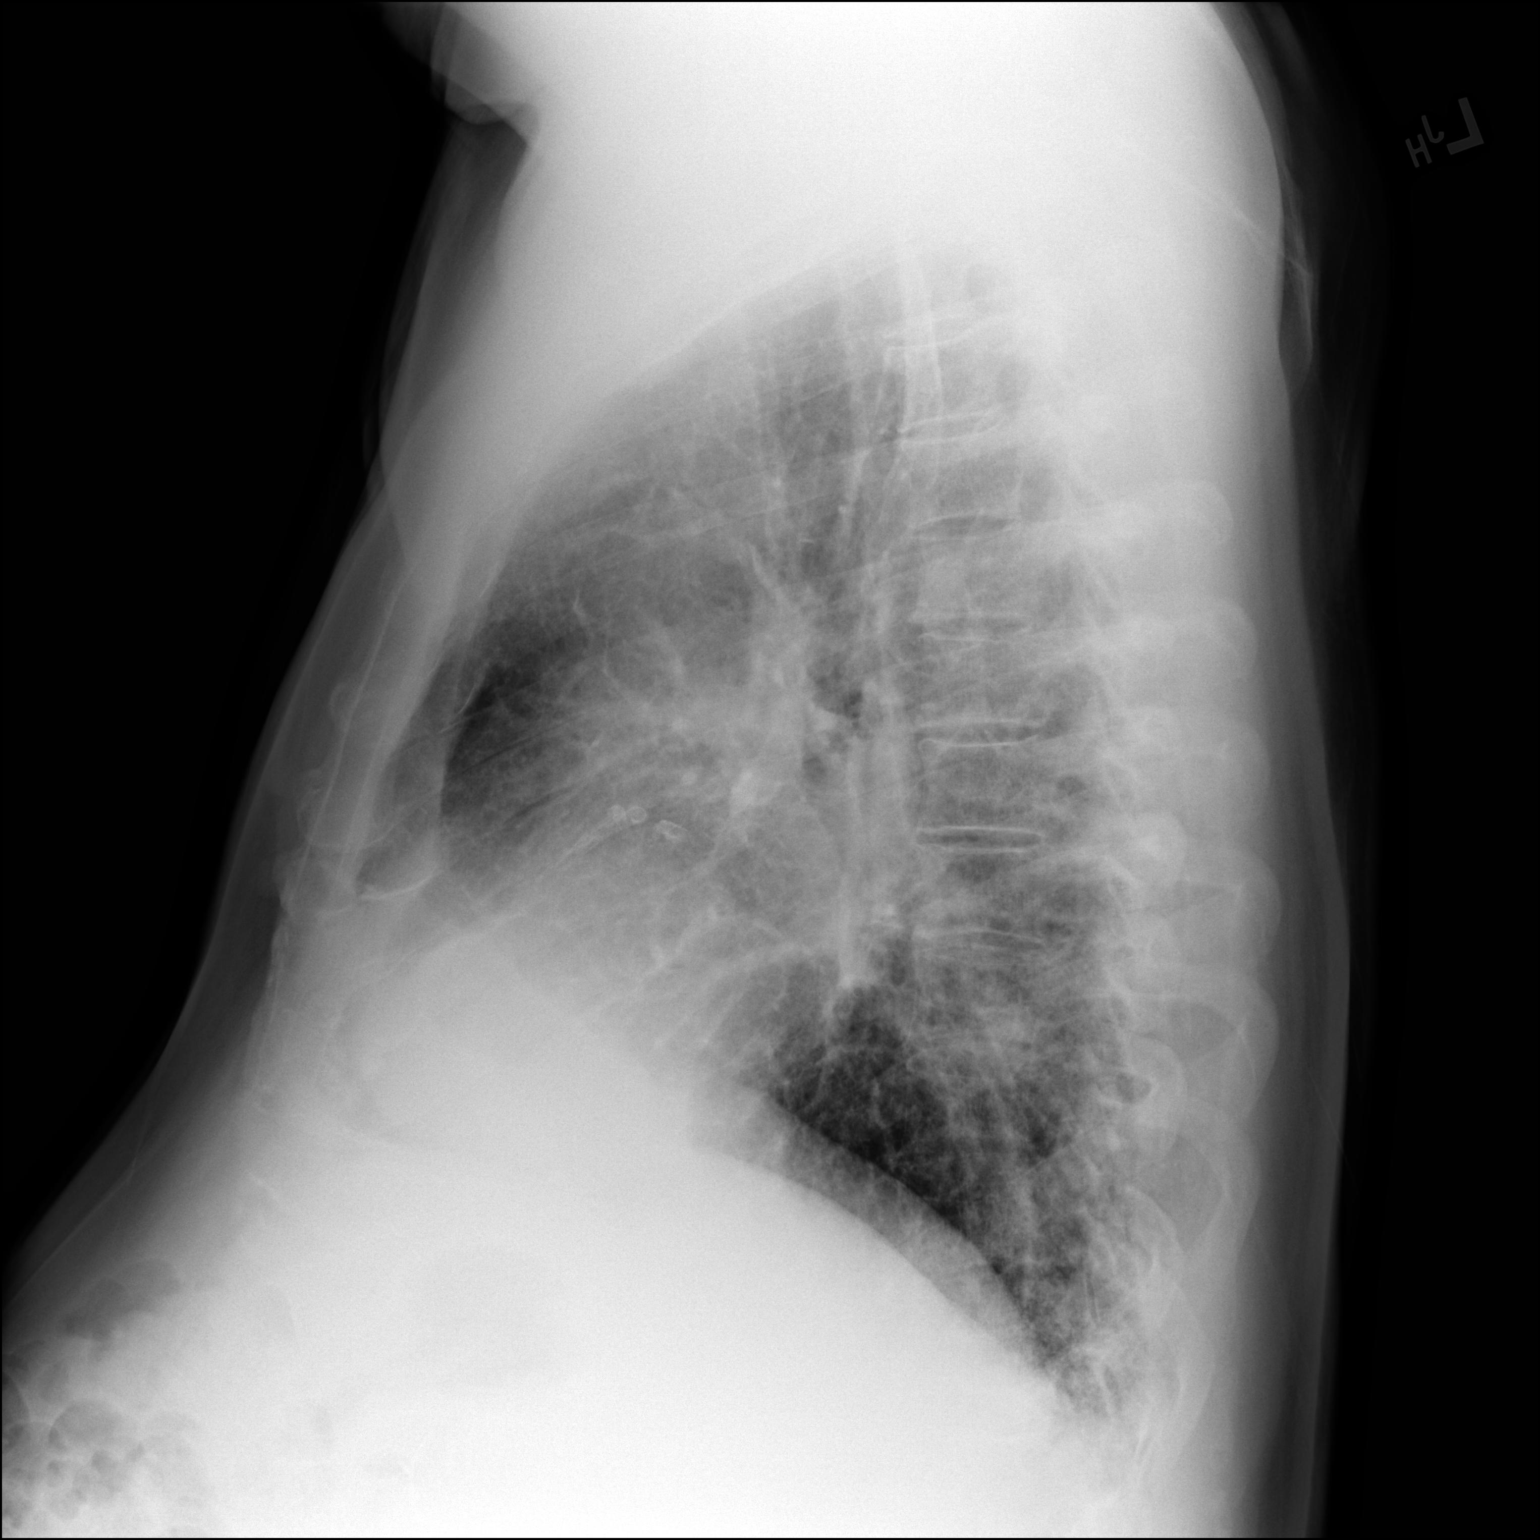

[2 of 2 positions shown; findings below may reference images not displayed]

FINDINGS: The heart size and mediastinal contours are within normal limits. No
pneumothorax or pleural effusion is noted. Right lung is clear. Mild
left basilar opacity is noted concerning for subsegmental
atelectasis or possibly infiltrate. The visualized skeletal
structures are unremarkable.
IMPRESSION: Mild left basilar subsegmental atelectasis or infiltrate is noted.

## 2021-03-31 ENCOUNTER — Telehealth: Payer: Self-pay | Admitting: Pulmonary Disease

## 2021-03-31 NOTE — Telephone Encounter (Signed)
Called and spoke with pt. Pt is needing a refill of his hycodan sent to [place]. Dr. Vaughan Browner, please advise.

## 2021-04-01 MED ORDER — HYDROCODONE BIT-HOMATROP MBR 5-1.5 MG/5ML PO SOLN: 5.0000 mL | Freq: Four times a day (QID) | ORAL | 0 refills | Status: DC | PRN

## 2021-04-01 NOTE — Telephone Encounter (Signed)
I have ordered the meds

## 2021-04-01 NOTE — Telephone Encounter (Signed)
LM with patient.   Call made to pharmacy. They have the script.   Nothing further needed at this time.

## 2021-04-15 ENCOUNTER — Other Ambulatory Visit (HOSPITAL_COMMUNITY): Payer: Self-pay

## 2021-04-18 ENCOUNTER — Other Ambulatory Visit (HOSPITAL_COMMUNITY): Payer: Self-pay

## 2021-05-01 ENCOUNTER — Ambulatory Visit: Payer: Medicare Other | Admitting: Adult Health

## 2021-05-01 ENCOUNTER — Telehealth: Payer: Self-pay | Admitting: Pulmonary Disease

## 2021-05-01 DIAGNOSIS — Z5181 Encounter for therapeutic drug level monitoring: Secondary | ICD-10-CM

## 2021-05-01 DIAGNOSIS — J84112 Idiopathic pulmonary fibrosis: Secondary | ICD-10-CM

## 2021-05-02 MED ORDER — HYDROCODONE BIT-HOMATROP MBR 5-1.5 MG/5ML PO SOLN: 5.0000 mL | Freq: Four times a day (QID) | ORAL | 0 refills | Status: DC | PRN

## 2021-05-02 NOTE — Telephone Encounter (Signed)
Called and spoke with patient. He verbalized understanding. ? ?Nothing further needed at time of call.  ?

## 2021-05-02 NOTE — Telephone Encounter (Signed)
Patient checking on cough syrup. Patient phone number is [phone].

## 2021-05-02 NOTE — Telephone Encounter (Signed)
Ok. I ordered

## 2021-05-02 NOTE — Telephone Encounter (Signed)
Called and spoke with patient. He is requesting a refill on his Hycodan cough syrup. [facility] [address]   Dr. Vaughan Browner, can you please advise if you are ok with this refill? Thanks!

## 2021-05-07 NOTE — Telephone Encounter (Addendum)
Received outdates [employer] PAP application for Ofev today. Called patient to determine if he turned in his portion. He states he completed his portion and sent it in. I inquired to confirm if income documents were sent in and he said "he completed his portion." He expressed frustration with the renewal process and states "if I don't get the medication, then I'm not taking it." I advised that the pt assistance requires annual renewal and he will have to go through this same process anmually.  I advised that we will submit provider portion. Spoke with patient's wife later upon noticing that patient has not been seen in clinic or had updated labs. She states that she has lots of coordination with her and his appointments and has requested him to manage his own appointments.  Patient has take minimum efforts per her recollection and she expresses being overwhelmed. I reviewed the risk for hepatotoxicity with Ofev and importance of lab monitoring and f/u appointments as well. Wife advised of lab hours. I also reviewed that we can only send a month supply to [facility] Cares to complete pt assistance renewal but he will need to be seen in clinic and have labs updated to get further refills  Patient needs updated CBC and CMET and needs OV with Dr. Vaughan Browner. Future order palced today. Routing to scheduling team to have appt scheduled.  [place] Fax: [phone] Phone: [phone]  Knox Saliva, PharmD, MPH, BCPS Clinical Pharmacist (Rheumatology and Pulmonology)

## 2021-05-08 ENCOUNTER — Other Ambulatory Visit (HOSPITAL_COMMUNITY): Payer: Self-pay

## 2021-05-08 MED ORDER — OFEV 100 MG PO CAPS: 100.0000 mg | ORAL_CAPSULE | Freq: Two times a day (BID) | ORAL | 0 refills | Status: DC

## 2021-05-08 NOTE — Telephone Encounter (Signed)
Received fax from [facility] that states that patient has been denied patient assistance due to having private drug coverage. Patient only has medicare Advantage plan. Spoke with patient's wife who states she has [employer] - I advised that this is not private plan but is still Medicare plan. They answered this question incorrectly. Patient's wife expressed frustration with process - however she had not reached out to [facility] or office for clarification on how to answer question.  Attempted to enroll patient into open PF PAF grant, but patient is already enrolled into another disease state grant through PAF and is ineligible.  Per test claim, copay for 30 day supply is $30. Wife states this is affordable. Provided [facility] Rx sent for one month only as patient needs f/u and labs. I recommended she call tomorrow at the earliest to set up shipment. She verbalized understanding.  Knox Saliva, PharmD, MPH, BCPS Clinical Pharmacist (Rheumatology and Pulmonology)

## 2021-05-08 NOTE — Telephone Encounter (Signed)
Patient is scheduled 05/20/2021 at 9:30am with Dr. Vaughan Browner.

## 2021-05-09 DIAGNOSIS — M545 Low back pain, unspecified: Secondary | ICD-10-CM | POA: Diagnosis not present

## 2021-05-13 ENCOUNTER — Other Ambulatory Visit (HOSPITAL_COMMUNITY): Payer: Self-pay

## 2021-05-16 ENCOUNTER — Other Ambulatory Visit (HOSPITAL_COMMUNITY): Payer: Self-pay

## 2021-05-20 ENCOUNTER — Ambulatory Visit (INDEPENDENT_AMBULATORY_CARE_PROVIDER_SITE_OTHER): Payer: Medicare Other | Admitting: Pulmonary Disease

## 2021-05-20 ENCOUNTER — Other Ambulatory Visit: Payer: Self-pay

## 2021-05-20 ENCOUNTER — Encounter: Payer: Self-pay | Admitting: Pulmonary Disease

## 2021-05-20 ENCOUNTER — Telehealth: Payer: Self-pay | Admitting: Pulmonary Disease

## 2021-05-20 VITALS — BP 130/68 | HR 70 | Temp 98.0°F | Ht 72.0 in | Wt 212.2 lb

## 2021-05-20 DIAGNOSIS — J84112 Idiopathic pulmonary fibrosis: Secondary | ICD-10-CM

## 2021-05-20 DIAGNOSIS — G4733 Obstructive sleep apnea (adult) (pediatric): Secondary | ICD-10-CM | POA: Diagnosis not present

## 2021-05-20 DIAGNOSIS — Z5181 Encounter for therapeutic drug level monitoring: Secondary | ICD-10-CM

## 2021-05-20 DIAGNOSIS — R0602 Shortness of breath: Secondary | ICD-10-CM | POA: Diagnosis not present

## 2021-05-20 LAB — COMPREHENSIVE METABOLIC PANEL
ALT: 23 U/L (ref 0–53)
AST: 30 U/L (ref 0–37)
Albumin: 3.7 g/dL (ref 3.5–5.2)
Alkaline Phosphatase: 148 U/L — ABNORMAL HIGH (ref 39–117)
BUN: 18 mg/dL (ref 6–23)
CO2: 28 mEq/L (ref 19–32)
Calcium: 9.5 mg/dL (ref 8.4–10.5)
Chloride: 103 mEq/L (ref 96–112)
Creatinine, Ser: 0.69 mg/dL (ref 0.40–1.50)
GFR: 93.66 mL/min (ref >60.00)
Glucose, Bld: 299 mg/dL — ABNORMAL HIGH (ref 70–99)
Potassium: 4.1 mEq/L (ref 3.5–5.1)
Sodium: 138 mEq/L (ref 135–145)
Total Bilirubin: 3.2 mg/dL — ABNORMAL HIGH (ref 0.2–1.2)
Total Protein: 6.4 g/dL (ref 6.0–8.3)

## 2021-05-20 LAB — CBC WITH DIFFERENTIAL/PLATELET
Basophils Absolute: 0 10*3/uL (ref 0.0–0.1)
Basophils Relative: 0.7 % (ref 0.0–3.0)
Eosinophils Absolute: 0.2 10*3/uL (ref 0.0–0.7)
Eosinophils Relative: 4.1 % (ref 0.0–5.0)
HCT: 45.2 % (ref 39.0–52.0)
Hemoglobin: 15.5 g/dL (ref 13.0–17.0)
Lymphocytes Relative: 29.2 % (ref 12.0–46.0)
Lymphs Abs: 1.3 10*3/uL (ref 0.7–4.0)
MCHC: 34.3 g/dL (ref 30.0–36.0)
MCV: 98.8 fl (ref 78.0–100.0)
Monocytes Absolute: 0.4 10*3/uL (ref 0.1–1.0)
Monocytes Relative: 7.9 % (ref 3.0–12.0)
Neutro Abs: 2.6 10*3/uL (ref 1.4–7.7)
Neutrophils Relative %: 58.1 % (ref 43.0–77.0)
Platelets: 98 10*3/uL — ABNORMAL LOW (ref 150.0–400.0)
RBC: 4.57 Mil/uL (ref 4.22–5.81)
RDW: 13.8 % (ref 11.5–15.5)
WBC: 4.4 10*3/uL (ref 4.0–10.5)

## 2021-05-20 MED ORDER — HYDROCODONE BIT-HOMATROP MBR 5-1.5 MG/5ML PO SOLN: 5.0000 mL | Freq: Four times a day (QID) | ORAL | 0 refills | Status: DC | PRN

## 2021-05-20 NOTE — Progress Notes (Signed)
Tyler Hendricks    [id number]    [birth year]  Primary Care Physician:Crawford, Real Cons, MD  Referring Physician: Hoyt Koch, MD [address] [place],  [place] [address]  Problem list: Follow-up for pulmonary fibrosis Started on Ofev in late June 2022.   HPI: 71 year old with history of coronary artery disease, HIV, hepatitis C, cirrhosis Complains of chronic cough for the past several years. He has paroxysms of cough associated with dyspnea. Mild chest congestion History notable for COVID-19 infection in December 2020. He had very mild symptoms and did not require hospitalization.  CT scan with probable UIP pattern pulmonary fibrosis. He has been referred to the ILD clinic for further evaluation  Pets: Has a dog. No cats, birds, farm animals Occupation: Retired Development worker, community Exposures: No known exposures. No mold, hot tub, Jacuzzi. No down pillows or comforters ILD questionnaire 10/03/2019-negative Smoking history: 60-pack-year smoker. Quit in late 1990s Travel history: Originally from [place]. Moved to [place] in 2018 Relevant family history: Mother had emphysema. She was a heavy smoker.  Interim history: Taking Ofev daily.  He is tolerating it well with minimal diarrhea He has not kept up with the ordered lab tests and there are no LFTs drawn since September 2022 Continues to have coughing which is improved somewhat with Hycodan cough syrup  Outpatient Encounter Medications as of 05/20/2021  Medication Sig   amLODipine (NORVASC) 2.5 MG tablet TAKE 1 TABLET(2.5 MG) BY MOUTH DAILY   atenolol (TENORMIN) 50 MG tablet Take 1 tablet (50 mg total) by mouth daily.   bictegravir-emtricitabine-tenofovir AF (BIKTARVY) 50-200-25 MG TABS tablet Take 1 tablet by mouth daily.   clopidogrel (PLAVIX) 75 MG tablet TAKE 1 TABLET(75 MG) BY MOUTH DAILY   HYDROcodone bit-homatropine (HYCODAN) 5-1.5 MG/5ML syrup Take 5 mLs by mouth every 6  (six) hours as needed for cough.   losartan-hydrochlorothiazide (HYZAAR) 100-25 MG tablet Take 1 tablet by mouth daily.   Multiple Vitamin (MULTIVITAMIN) tablet Take 1 tablet by mouth daily.   Oxycodone HCl 10 MG TABS Take 10 mg by mouth as needed.   rosuvastatin (CRESTOR) 10 MG tablet TAKE 1 TABLET(10 MG) BY MOUTH DAILY   SYNJARDY XR 12.07-998 MG TB24 TK 2 TS PO QD   VASCEPA 1 g capsule Take 2 capsules (2 g total) by mouth 2 (two) times daily.   Nintedanib (OFEV) 100 MG CAPS Take 1 capsule (100 mg total) by mouth 2 (two) times daily with a meal. (Patient not taking: Reported on 05/20/2021)   No facility-administered encounter medications on file as of 05/20/2021.    Physical Exam: Blood pressure 130/68, pulse 70, temperature 98 F (36.7 C), temperature source Oral, height 6' (1.829 m), weight 212 lb 3.2 oz (96.3 kg), SpO2 95 %. Gen:      No acute distress HEENT:  EOMI, sclera anicteric Neck:     No masses; no thyromegaly Lungs:    Bibasal crackles CV:         Regular rate and rhythm; no murmurs Abd:      + bowel sounds; soft, non-tender; no palpable masses, no distension Ext:    No edema; adequate peripheral perfusion Skin:      Warm and dry; no rash Neuro: alert and oriented x 3 Psych: normal mood and affect   Data Reviewed: Imaging: High-res CT 07/13/2019-groundglass attenuation with septal thickening, cylindrical bronchiectasis with mild basal gradient Probable UIP pattern High-res CT 06/24/2020-stable pulmonary fibrosis and probable  UIP pattern I have reviewed the images personally  PFTs: 09/01/2019 FVC 3.78 [78%], FEV1 3.14 [87%], F/F 83, TLC 6.40 [86%], DLCO 22.76 [81%] Normal test  06/28/2020 FVC 3.70 [71%], FEV1 3.05 [85%], F/F 82, TLC 5.76 [79%], DLCO 19.32 [69%] Minimal restriction, mild diffusion defect  Labs: CTD serologies 08/30/2019-positive for ANA 1:40, nuclear, speckled  Sleep: CPAP download 11/25/2019-2% compliance  Assessment:  Pulmonary fibrosis, IPF Has  probable UIP pattern on imaging There is no symptoms of connective tissue disease. Borderline positive ANA is likely nonspecific He did have Covid in December 2020 but it was a very mild case with no hospitalization, besides his symptoms preceded the Covid infection  He likely has IPF based on CT appearance in male smoker. Discussed further work-up such as bronchoscopy, lung biopsy but he wants to avoid invasive procedures  After multiple discussions informed decision making with the patient we have started treatment with Ofev which is tolerating well so far.  We need to be cautious given history of cirrhosis He will need repeat LFTs today Schedule high-res CT and PFTs in 2 months for annual follow-up  Chronic cough Secondary to pulmonary fibrosis.  He is getting Hycodan cough syrup We discussed use of PPI for treatment of silent reflux though he does not have overt symptoms but he does not want to be on multiple medications  Severe OSA Noncompliant with CPAP.  Encouraged him to use it daily.  Health maintenance Up-to-date with Covid booster and flu vaccine  Plan/Recommendations: Continue Ofev Continue lab monitoring High-res CT, PFTs  Marshell Garfinkel MD  Pulmonary and Critical Care 05/20/2021, 9:54 AM  CC: Hoyt Koch, *

## 2021-05-20 NOTE — Telephone Encounter (Signed)
Please advise on refill.

## 2021-05-20 NOTE — Patient Instructions (Signed)
I am glad you are stable with regard to your breathing Continue the medication for scarring in the lung We will check labs today including comprehensive metabolic panel, CBC and proBNP for dyspnea  We will make sure that follow-up high-res CT and PFTs are ordered for April 2023 Follow-up in early March for review of tests

## 2021-05-20 NOTE — Telephone Encounter (Signed)
Ok. I sent the prescription in

## 2021-05-20 NOTE — Telephone Encounter (Signed)
Patient is aware of below message and voiced his understanding.  Nothing further needed.   

## 2021-05-21 ENCOUNTER — Other Ambulatory Visit: Payer: Self-pay | Admitting: Internal Medicine

## 2021-05-21 DIAGNOSIS — H0102A Squamous blepharitis right eye, upper and lower eyelids: Secondary | ICD-10-CM | POA: Diagnosis not present

## 2021-05-21 DIAGNOSIS — H353132 Nonexudative age-related macular degeneration, bilateral, intermediate dry stage: Secondary | ICD-10-CM | POA: Diagnosis not present

## 2021-05-21 DIAGNOSIS — H35033 Hypertensive retinopathy, bilateral: Secondary | ICD-10-CM | POA: Diagnosis not present

## 2021-05-21 DIAGNOSIS — H04123 Dry eye syndrome of bilateral lacrimal glands: Secondary | ICD-10-CM | POA: Diagnosis not present

## 2021-05-21 DIAGNOSIS — I10 Essential (primary) hypertension: Secondary | ICD-10-CM

## 2021-05-21 DIAGNOSIS — H0288B Meibomian gland dysfunction left eye, upper and lower eyelids: Secondary | ICD-10-CM | POA: Diagnosis not present

## 2021-05-21 DIAGNOSIS — H0288A Meibomian gland dysfunction right eye, upper and lower eyelids: Secondary | ICD-10-CM | POA: Diagnosis not present

## 2021-05-21 DIAGNOSIS — H0102B Squamous blepharitis left eye, upper and lower eyelids: Secondary | ICD-10-CM | POA: Diagnosis not present

## 2021-05-21 DIAGNOSIS — E119 Type 2 diabetes mellitus without complications: Secondary | ICD-10-CM | POA: Diagnosis not present

## 2021-05-21 DIAGNOSIS — H2511 Age-related nuclear cataract, right eye: Secondary | ICD-10-CM | POA: Diagnosis not present

## 2021-05-21 LAB — PRO B NATRIURETIC PEPTIDE: NT-Pro BNP: 76 pg/mL (ref 0–376)

## 2021-05-26 ENCOUNTER — Other Ambulatory Visit: Payer: Self-pay | Admitting: Pulmonary Disease

## 2021-05-26 DIAGNOSIS — J84112 Idiopathic pulmonary fibrosis: Secondary | ICD-10-CM

## 2021-05-26 NOTE — Telephone Encounter (Signed)
Refill sent for OFEV to [facility] (pulmonary fibrosis team): [phone] ? ?Dose: 100 mg twice daily ? ?Last OV: 05/20/21 ?Provider: Dr. Vaughan Browner ? ?Next OV: 07/2521 ? ?Knox Saliva, PharmD, MPH, BCPS ?Clinical Pharmacist (Rheumatology and Pulmonology)  ?

## 2021-05-28 ENCOUNTER — Telehealth: Payer: Self-pay | Admitting: Adult Health

## 2021-05-28 NOTE — Telephone Encounter (Signed)
Ok

## 2021-05-28 NOTE — Telephone Encounter (Signed)
FYI pt accepted f/u with Jinny Blossom, NP on 03-17 with 11:00 check in, he confirmed no new issues. ?

## 2021-05-31 ENCOUNTER — Other Ambulatory Visit: Payer: Self-pay | Admitting: Internal Medicine

## 2021-05-31 DIAGNOSIS — I2510 Atherosclerotic heart disease of native coronary artery without angina pectoris: Secondary | ICD-10-CM

## 2021-05-31 DIAGNOSIS — E781 Pure hyperglyceridemia: Secondary | ICD-10-CM

## 2021-06-02 DIAGNOSIS — M48061 Spinal stenosis, lumbar region without neurogenic claudication: Secondary | ICD-10-CM | POA: Diagnosis not present

## 2021-06-02 DIAGNOSIS — D6869 Other thrombophilia: Secondary | ICD-10-CM | POA: Diagnosis not present

## 2021-06-02 DIAGNOSIS — Z79891 Long term (current) use of opiate analgesic: Secondary | ICD-10-CM | POA: Diagnosis not present

## 2021-06-02 DIAGNOSIS — M545 Low back pain, unspecified: Secondary | ICD-10-CM | POA: Diagnosis not present

## 2021-06-02 DIAGNOSIS — M5416 Radiculopathy, lumbar region: Secondary | ICD-10-CM | POA: Diagnosis not present

## 2021-06-06 ENCOUNTER — Encounter: Payer: Self-pay | Admitting: Adult Health

## 2021-06-06 ENCOUNTER — Ambulatory Visit (INDEPENDENT_AMBULATORY_CARE_PROVIDER_SITE_OTHER): Payer: Medicare Other | Admitting: Adult Health

## 2021-06-06 ENCOUNTER — Telehealth: Payer: Self-pay | Admitting: Pulmonary Disease

## 2021-06-06 VITALS — BP 137/74 | HR 59 | Ht 71.0 in | Wt 207.0 lb

## 2021-06-06 DIAGNOSIS — Z9989 Dependence on other enabling machines and devices: Secondary | ICD-10-CM | POA: Diagnosis not present

## 2021-06-06 DIAGNOSIS — I2510 Atherosclerotic heart disease of native coronary artery without angina pectoris: Secondary | ICD-10-CM | POA: Diagnosis not present

## 2021-06-06 DIAGNOSIS — G4733 Obstructive sleep apnea (adult) (pediatric): Secondary | ICD-10-CM | POA: Diagnosis not present

## 2021-06-06 MED ORDER — HYDROCODONE BIT-HOMATROP MBR 5-1.5 MG/5ML PO SOLN: 5.0000 mL | Freq: Four times a day (QID) | ORAL | 0 refills | Status: DC | PRN

## 2021-06-06 NOTE — Telephone Encounter (Signed)
Called and spoke with patient. He is requesting a refill on his Hycodan cough syrup. Last refill was sent on 05/20/21 for 271m. His last OV was on 05/20/21 and his next OV and PFT are scheduled for May 2023.  ? ?[facility] [address]  ? ?Dr. MVaughan Browner please advise if you are ok with this refill. Thanks!  ?

## 2021-06-06 NOTE — Telephone Encounter (Signed)
Ok. I refilled ?

## 2021-06-06 NOTE — Telephone Encounter (Signed)
Called and spoke with patient. He verbalized understanding.  ?

## 2021-06-06 NOTE — Progress Notes (Signed)
? ? ?PATIENT: Tyler Hendricks ?DOB: 1950/05/06 ? ?REASON FOR VISIT: follow up ?HISTORY FROM: patient ?Primary neurologist: Dr. Rexene Alberts ? ?HISTORY OF PRESENT ILLNESS: ?Today 06/06/21: Tyler Hendricks is a 71 year old male with a history of obstructive sleep apnea on CPAP.  He returns today for follow-up.  He reports that he is still not been using his CPAP.  He does plan to restart it this weekend.  Has a diagnosis of pulmonary fibrosis will be following up with pulmonology next month. ? ?10/29/20: Tyler Hendricks is a 71 year old male with a history of obstructive sleep apnea on CPAP.  He returns today for follow-up.  He reports that he has not been using the CPAP.  At the last visit he was also not using the machine.  States that he has some pulmonary issues as well as other things that occupy his thoughts and he forgets to use the CPAP.  According to the pulmonologist note he has been diagnosed with idiopathic pulmonary fibrosis and is currently on Ofev.  He returns today for follow-up. ? ?04/29/20: Tyler Hendricks is a 71 year old male with a history of obstructive sleep apnea on CPAP.  He reports that he has not been using the machine for the last 6 months.  He reports that he got out of the habit of using it.  Reports that sometimes he will fall asleep on the couch and that he does not put his machine on.  He states for the last 2 days he has restarted using it.  He returns today for evaluation. ? ?04/26/19 Tyler Hendricks is a 71 year old male with a history of obstructive sleep apnea on CPAP.  His download indicates that he uses machine 21 out of 30 days for compliance of 70%.  He uses machine greater than 4 hours 11 days for compliance of 37%.  On average he uses his machine 4 hours and 22 minutes.  His residual AHI is 3.6 on 7 to 15 cm of water with EPR of 2.  His leak in the 95th percentile is 9.6 L/min.  He reports that there are some nights he gets up to the bathroom and does not put his machine back on.  He still feels that the CPAP works  well for him.  His Epworth sleepiness score is 610 fatigue severity score is 13. ? ?HISTORY  ?10/13/18: ?Tyler Hendricks is a 71 year old male with a history of obstructive sleep apnea on CPAP.  He returns today for follow-up.  His download indicates that he use his machine 23 out of 30 days for compliance of 77%.  He uses machine greater than 4 hours 11 days for compliance of 37%.  On average he uses his machine 3 hours and 56 minutes.  His residual AHI is 4.2 on 7 to 15 cm of water with EPR of 2.  His leak in the 95th percentile is 9.8.  He reports that some nights he does not get more than 4 hours of sleep.  He states that he is getting more used to the mask.  His Epworth sleepiness score is 2 and fatigue severity score is 16.  He returns today for follow-up. ? ?REVIEW OF SYSTEMS: Out of a complete 14 system review of symptoms, the patient complains only of the following symptoms, and all other reviewed systems are negative. ? ?Fatigue severity score 34 ?Epworth sleepiness score 13 ? ?ALLERGIES: ?Allergies  ?Allergen Reactions  ? Lopressor [Metoprolol Tartrate] Rash and Other (See Comments)  ?  Bleeding (non-specific)   ? Integrilin [  Eptifibatide] Other (See Comments)  ?  Bleeding (non-specific)  ? Penicillins Diarrhea and Rash  ? ? ?HOME MEDICATIONS: ?Outpatient Medications Prior to Visit  ?Medication Sig Dispense Refill  ? amLODipine (NORVASC) 2.5 MG tablet TAKE 1 TABLET(2.5 MG) BY MOUTH DAILY 90 tablet 0  ? atenolol (TENORMIN) 50 MG tablet TAKE 1 TABLET BY MOUTH EVERY DAY 90 tablet 0  ? bictegravir-emtricitabine-tenofovir AF (BIKTARVY) 50-200-25 MG TABS tablet Take 1 tablet by mouth daily. 30 tablet 11  ? clopidogrel (PLAVIX) 75 MG tablet TAKE 1 TABLET(75 MG) BY MOUTH DAILY 90 tablet 3  ? HYDROcodone bit-homatropine (HYCODAN) 5-1.5 MG/5ML syrup Take 5 mLs by mouth every 6 (six) hours as needed for cough. 240 mL 0  ? losartan-hydrochlorothiazide (HYZAAR) 100-25 MG tablet Take 1 tablet by mouth daily. 90 tablet 3  ?  Multiple Vitamin (MULTIVITAMIN) tablet Take 1 tablet by mouth daily.    ? Nintedanib (OFEV) 100 MG CAPS TAKE 1 CAPSULE BY MOUTH TWICE DAILY WITH FOOD. 60 capsule 5  ? Oxycodone HCl 10 MG TABS Take 10 mg by mouth as needed.    ? rosuvastatin (CRESTOR) 10 MG tablet TAKE 1 TABLET BY MOUTH EVERY DAY 90 tablet 0  ? SYNJARDY XR 12.07-998 MG TB24 TK 2 TS PO QD 180 tablet 3  ? VASCEPA 1 g capsule TAKE 2 CAPSULES BY MOUTH 2 TIMES DAILY. 360 capsule 3  ? ?No facility-administered medications prior to visit.  ? ? ?PAST MEDICAL HISTORY: ?Past Medical History:  ?Diagnosis Date  ? Abnormal result of cardiovascular function study, unspecified   ? Atherosclerotic heart disease of native coronary artery without angina pectoris   ? CAD (coronary artery disease)   ? Coronary angioplasty status   ? Diabetes mellitus without complication ([place])   ? Essential (primary) hypertension   ? Gallstones   ? Hepatic cirrhosis ([place])   ? Hepatitis C   ? HIV infection ([place])   ? Hyperlipidemia   ? Hypertension   ? Internal hemorrhoids   ? Kidney stones   ? Lung disease   ? Mixed hyperlipidemia   ? Palpitations   ? Sleep apnea   ? CPAP  ? Tubular adenoma of colon   ? ? ?PAST SURGICAL HISTORY: ?Past Surgical History:  ?Procedure Laterality Date  ? CARDIAC CATHETERIZATION Left 04/2013  ? chlecystectomy    ? COLONOSCOPY  05/02/2013  ? PERCUTANEOUS CORONARY STENT INTERVENTION (PCI-S)    ? POLYPECTOMY    ? UMBILICAL HERNIA REPAIR    ? ? ?FAMILY HISTORY: ?Family History  ?Problem Relation Age of Onset  ? Emphysema Mother   ?     pulmonary  ? Heart attack Father 2  ? Lung cancer Sister   ? Esophageal cancer Brother   ? Esophageal cancer Brother   ? Colon cancer Neg Hx   ? Inflammatory bowel disease Neg Hx   ? Liver disease Neg Hx   ? Pancreatic cancer Neg Hx   ? Rectal cancer Neg Hx   ? Stomach cancer Neg Hx   ? Colon polyps Neg Hx   ? ? ?SOCIAL HISTORY: ?Social History  ? ?Socioeconomic History  ? Marital status: Married  ?  Spouse name: Not on file  ?  Number of children: 1  ? Years of education: Not on file  ? Highest education level: 10th grade  ?Occupational History  ? Occupation: retired  ?Tobacco Use  ? Smoking status: Former  ?  Packs/day: 2.00  ?  Years: 20.00  ?  Pack  years: 40.00  ?  Types: Cigarettes  ?  Start date: 74  ?  Quit date: 42  ?  Years since quitting: 29.2  ?  Passive exposure: Past  ? Smokeless tobacco: Never  ?Vaping Use  ? Vaping Use: Never used  ?Substance and Sexual Activity  ? Alcohol use: Yes  ?  Comment: social-occ beer  ? Drug use: No  ? Sexual activity: Yes  ?Other Topics Concern  ? Not on file  ?Social History Narrative  ? Not on file  ? ?Social Determinants of Health  ? ?Financial Resource Strain: Low Risk   ? Difficulty of Paying Living Expenses: Not hard at all  ?Food Insecurity: No Food Insecurity  ? Worried About Charity fundraiser in the Last Year: Never true  ? Ran Out of Food in the Last Year: Never true  ?Transportation Needs: No Transportation Needs  ? Lack of Transportation (Medical): No  ? Lack of Transportation (Non-Medical): No  ?Physical Activity: Sufficiently Active  ? Days of Exercise per Week: 5 days  ? Minutes of Exercise per Session: 30 min  ?Stress: No Stress Concern Present  ? Feeling of Stress : Not at all  ?Social Connections: Unknown  ? Frequency of Communication with Friends and Family: More than three times a week  ? Frequency of Social Gatherings with Friends and Family: More than three times a week  ? Attends Religious Services: Not on file  ? Active Member of Clubs or Organizations: Not on file  ? Attends Archivist Meetings: Not on file  ? Marital Status: Married  ?Intimate Partner Violence: Not At Risk  ? Fear of Current or Ex-Partner: No  ? Emotionally Abused: No  ? Physically Abused: No  ? Sexually Abused: No  ? ? ? ? ?PHYSICAL EXAM ? ?Vitals:  ? 06/06/21 1132  ?BP: 137/74  ?Pulse: (!) 59  ?Weight: 207 lb (93.9 kg)  ?Height: '5\' 11"'$  (1.803 m)  ? ? ?Body mass index is 28.87  kg/m?. ?Generalized: Well developed, in no acute distress  ? ? ?Neurological examination  ?Mentation: Alert oriented to time, place, history taking. Follows all commands speech and language fluent ?Cranial nerve II-XI

## 2021-06-06 NOTE — Patient Instructions (Signed)
Continue using CPAP nightly and greater than 4 hours each night °If your symptoms worsen or you develop new symptoms please let us know.  ° °

## 2021-06-09 ENCOUNTER — Other Ambulatory Visit (HOSPITAL_COMMUNITY): Payer: Self-pay

## 2021-06-11 ENCOUNTER — Other Ambulatory Visit (INDEPENDENT_AMBULATORY_CARE_PROVIDER_SITE_OTHER): Payer: Medicare Other

## 2021-06-11 ENCOUNTER — Other Ambulatory Visit (HOSPITAL_COMMUNITY): Payer: Self-pay

## 2021-06-11 DIAGNOSIS — Z5181 Encounter for therapeutic drug level monitoring: Secondary | ICD-10-CM | POA: Diagnosis not present

## 2021-06-11 LAB — CBC WITH DIFFERENTIAL/PLATELET
Basophils Absolute: 0.1 10*3/uL (ref 0.0–0.1)
Basophils Relative: 1.1 % (ref 0.0–3.0)
Eosinophils Absolute: 0.2 10*3/uL (ref 0.0–0.7)
Eosinophils Relative: 4 % (ref 0.0–5.0)
HCT: 46.4 % (ref 39.0–52.0)
Hemoglobin: 16.3 g/dL (ref 13.0–17.0)
Lymphocytes Relative: 30.2 % (ref 12.0–46.0)
Lymphs Abs: 1.8 10*3/uL (ref 0.7–4.0)
MCHC: 35.2 g/dL (ref 30.0–36.0)
MCV: 97.5 fl (ref 78.0–100.0)
Monocytes Absolute: 0.4 10*3/uL (ref 0.1–1.0)
Monocytes Relative: 7.5 % (ref 3.0–12.0)
Neutro Abs: 3.3 10*3/uL (ref 1.4–7.7)
Neutrophils Relative %: 57.2 % (ref 43.0–77.0)
Platelets: 118 10*3/uL — ABNORMAL LOW (ref 150.0–400.0)
RBC: 4.76 Mil/uL (ref 4.22–5.81)
RDW: 13.6 % (ref 11.5–15.5)
WBC: 5.8 10*3/uL (ref 4.0–10.5)

## 2021-06-11 LAB — HEPATIC FUNCTION PANEL
ALT: 27 U/L (ref 0–53)
AST: 47 U/L — ABNORMAL HIGH (ref 0–37)
Albumin: 4 g/dL (ref 3.5–5.2)
Alkaline Phosphatase: 105 U/L (ref 39–117)
Bilirubin, Direct: 0.7 mg/dL — ABNORMAL HIGH (ref 0.0–0.3)
Total Bilirubin: 4.2 mg/dL — ABNORMAL HIGH (ref 0.2–1.2)
Total Protein: 6.6 g/dL (ref 6.0–8.3)

## 2021-06-20 ENCOUNTER — Other Ambulatory Visit: Payer: Self-pay | Admitting: Internal Medicine

## 2021-06-20 DIAGNOSIS — E1165 Type 2 diabetes mellitus with hyperglycemia: Secondary | ICD-10-CM

## 2021-06-25 ENCOUNTER — Other Ambulatory Visit: Payer: Self-pay | Admitting: Internal Medicine

## 2021-06-30 DIAGNOSIS — D225 Melanocytic nevi of trunk: Secondary | ICD-10-CM | POA: Diagnosis not present

## 2021-06-30 DIAGNOSIS — L814 Other melanin hyperpigmentation: Secondary | ICD-10-CM | POA: Diagnosis not present

## 2021-06-30 DIAGNOSIS — L570 Actinic keratosis: Secondary | ICD-10-CM | POA: Diagnosis not present

## 2021-06-30 DIAGNOSIS — L821 Other seborrheic keratosis: Secondary | ICD-10-CM | POA: Diagnosis not present

## 2021-07-03 ENCOUNTER — Other Ambulatory Visit (HOSPITAL_COMMUNITY): Payer: Self-pay

## 2021-07-03 DIAGNOSIS — H2511 Age-related nuclear cataract, right eye: Secondary | ICD-10-CM | POA: Diagnosis not present

## 2021-07-04 ENCOUNTER — Other Ambulatory Visit: Payer: Medicare Other

## 2021-07-04 ENCOUNTER — Other Ambulatory Visit: Payer: Self-pay

## 2021-07-04 DIAGNOSIS — B20 Human immunodeficiency virus [HIV] disease: Secondary | ICD-10-CM | POA: Diagnosis not present

## 2021-07-07 ENCOUNTER — Other Ambulatory Visit: Payer: Self-pay | Admitting: Pulmonary Disease

## 2021-07-08 MED ORDER — HYDROCODONE BIT-HOMATROP MBR 5-1.5 MG/5ML PO SOLN: 5.0000 mL | Freq: Four times a day (QID) | ORAL | 0 refills | Status: DC | PRN

## 2021-07-08 NOTE — Telephone Encounter (Signed)
I sent in the prescription for cough medications ?

## 2021-07-10 ENCOUNTER — Other Ambulatory Visit (HOSPITAL_COMMUNITY): Payer: Self-pay

## 2021-07-10 ENCOUNTER — Telehealth: Payer: Self-pay | Admitting: Pulmonary Disease

## 2021-07-10 ENCOUNTER — Other Ambulatory Visit (INDEPENDENT_AMBULATORY_CARE_PROVIDER_SITE_OTHER): Payer: Medicare Other

## 2021-07-10 DIAGNOSIS — Z5181 Encounter for therapeutic drug level monitoring: Secondary | ICD-10-CM | POA: Diagnosis not present

## 2021-07-10 LAB — HEPATIC FUNCTION PANEL
ALT: 27 U/L (ref 0–53)
AST: 32 U/L (ref 0–37)
Albumin: 3.8 g/dL (ref 3.5–5.2)
Alkaline Phosphatase: 112 U/L (ref 39–117)
Bilirubin, Direct: 0.7 mg/dL — ABNORMAL HIGH (ref 0.0–0.3)
Total Bilirubin: 3.8 mg/dL — ABNORMAL HIGH (ref 0.2–1.2)
Total Protein: 6.6 g/dL (ref 6.0–8.3)

## 2021-07-10 LAB — COMPREHENSIVE METABOLIC PANEL
AG Ratio: 1.4 (calc) (ref 1.0–2.5)
ALT: 18 U/L (ref 9–46)
AST: 27 U/L (ref 10–35)
Albumin: 3.8 g/dL (ref 3.6–5.1)
Alkaline phosphatase (APISO): 103 U/L (ref 35–144)
BUN/Creatinine Ratio: 24 (calc) — ABNORMAL HIGH (ref 6–22)
BUN: 15 mg/dL (ref 7–25)
CO2: 30 mmol/L (ref 20–32)
Calcium: 9.1 mg/dL (ref 8.6–10.3)
Chloride: 105 mmol/L (ref 98–110)
Creat: 0.62 mg/dL — ABNORMAL LOW (ref 0.70–1.28)
Globulin: 2.7 g/dL (calc) (ref 1.9–3.7)
Glucose, Bld: 124 mg/dL — ABNORMAL HIGH (ref 65–99)
Potassium: 4.4 mmol/L (ref 3.5–5.3)
Sodium: 140 mmol/L (ref 135–146)
Total Bilirubin: 3.1 mg/dL — ABNORMAL HIGH (ref 0.2–1.2)
Total Protein: 6.5 g/dL (ref 6.1–8.1)

## 2021-07-10 LAB — CBC WITH DIFFERENTIAL/PLATELET
Basophils Absolute: 0.1 10*3/uL (ref 0.0–0.1)
Basophils Relative: 1.5 % (ref 0.0–3.0)
Eosinophils Absolute: 0.2 10*3/uL (ref 0.0–0.7)
Eosinophils Relative: 3.7 % (ref 0.0–5.0)
HCT: 50.1 % (ref 39.0–52.0)
Hemoglobin: 17.1 g/dL — ABNORMAL HIGH (ref 13.0–17.0)
Lymphocytes Relative: 28.1 % (ref 12.0–46.0)
Lymphs Abs: 1.5 10*3/uL (ref 0.7–4.0)
MCHC: 34.2 g/dL (ref 30.0–36.0)
MCV: 97.6 fl (ref 78.0–100.0)
Monocytes Absolute: 0.4 10*3/uL (ref 0.1–1.0)
Monocytes Relative: 7.5 % (ref 3.0–12.0)
Neutro Abs: 3.1 10*3/uL (ref 1.4–7.7)
Neutrophils Relative %: 59.2 % (ref 43.0–77.0)
Platelets: 112 10*3/uL — ABNORMAL LOW (ref 150.0–400.0)
RBC: 5.13 Mil/uL (ref 4.22–5.81)
RDW: 13.8 % (ref 11.5–15.5)
WBC: 5.2 10*3/uL (ref 4.0–10.5)

## 2021-07-10 LAB — T-HELPER CELLS (CD4) COUNT (NOT AT ARMC)
Absolute CD4: 655 cells/uL (ref 490–1740)
CD4 T Helper %: 53 % (ref 30–61)
Total lymphocyte count: 1244 cells/uL (ref 850–3900)

## 2021-07-10 LAB — HIV-1 RNA QUANT-NO REFLEX-BLD
HIV 1 RNA Quant: NOT DETECTED copies/mL
HIV-1 RNA Quant, Log: NOT DETECTED Log copies/mL

## 2021-07-10 MED ORDER — HYDROCODONE BIT-HOMATROP MBR 5-1.5 MG/5ML PO SOLN
5.0000 mL | Freq: Four times a day (QID) | ORAL | 0 refills | Status: DC | PRN
  Filled 2021-07-10: qty 240, 12d supply, fill #0

## 2021-07-10 NOTE — Telephone Encounter (Signed)
Order has been sent to [facility]. ?

## 2021-07-10 NOTE — Telephone Encounter (Signed)
Called and spoke with pt who states [facility], they told him that they did not have it in stock. Pt states that he now needs this to be sent to [facility]. I have made this the pharmacy. ? ?Dr. Vaughan Browner, please advise on this. ?

## 2021-07-13 ENCOUNTER — Other Ambulatory Visit: Payer: Self-pay | Admitting: Internal Medicine

## 2021-07-14 ENCOUNTER — Other Ambulatory Visit (HOSPITAL_COMMUNITY): Payer: Self-pay

## 2021-07-18 ENCOUNTER — Encounter: Payer: Self-pay | Admitting: Family

## 2021-07-18 ENCOUNTER — Other Ambulatory Visit: Payer: Self-pay

## 2021-07-18 ENCOUNTER — Ambulatory Visit (INDEPENDENT_AMBULATORY_CARE_PROVIDER_SITE_OTHER): Payer: Medicare Other | Admitting: Family

## 2021-07-18 VITALS — BP 129/76 | HR 59 | Resp 16 | Ht 71.0 in | Wt 205.0 lb

## 2021-07-18 DIAGNOSIS — Z0000 Encounter for general adult medical examination without abnormal findings: Secondary | ICD-10-CM

## 2021-07-18 DIAGNOSIS — B20 Human immunodeficiency virus [HIV] disease: Secondary | ICD-10-CM

## 2021-07-18 DIAGNOSIS — I2510 Atherosclerotic heart disease of native coronary artery without angina pectoris: Secondary | ICD-10-CM | POA: Diagnosis not present

## 2021-07-18 NOTE — Addendum Note (Signed)
Addended by: Mauricio Po D on: 07/18/2021 02:54 PM ? ? Modules accepted: Orders ? ?

## 2021-07-18 NOTE — Assessment & Plan Note (Addendum)
Mr. Langenderfer continues to have well-controlled virus with good adherence and tolerance to Biktarvy.  We reviewed lab work and discussed plan of care.  Continue current dose of Biktarvy.  Plan for follow-up in 6 months or sooner if needed with lab work 1 to 2 weeks prior to appointment. ?

## 2021-07-18 NOTE — Patient Instructions (Signed)
Nice to see you.  Continue to take your medication daily as prescribed.  Refills have been sent to the pharmacy.  Plan for follow up in 6 months or sooner if needed with lab work 1-2 weeks prior to appointment.   Have a great day and stay safe!  

## 2021-07-18 NOTE — Assessment & Plan Note (Signed)
Discussed importance of safe sexual practices and condom use.  Condoms offered. ?

## 2021-07-18 NOTE — Progress Notes (Signed)
? ? ?Brief Narrative  ? ?Patient ID: Tyler Hendricks, male    DOB: May 12, 1950, 71 y.o.   MRN: [id number] ? ?Tyler Hendricks is a 71 y/o caucasian male diagnosed with HIV disease in 1989 with risk factor being IV drug use. Initial viral load is unknown with CD4 nadir of 510. No history of opportunistic infection. [id number] negative. Previous ART history with Zerit/Stauvadine-Epivir, Atripla, Complera and Odefsey.  ? ?Subjective:  ?  ?Chief Complaint  ?Patient presents with  ? HIV Positive/AIDS  ? ? ?HPI: ? ?Tyler Hendricks is a 71 y.o. male with HIV disease last seen on 12/27/20 with well controlled virus and good adherence and tolerance to his ART regimen of Biktarvy. Viral load was undetectable. Most recent lab work completed on 07/04/21 with well controlled virus and CD4 count of 655.  Kidney function, liver function, electrolytes within normal ranges.  Here today for routine follow-up. ? ?Tyler Hendricks continues to take [employer] daily as prescribed with no adverse side effects.  Overall feeling well today with no new concerns/complaints. Denies fevers, chills, night sweats, headaches, changes in vision, neck pain/stiffness, nausea, diarrhea, vomiting, lesions or rashes. ? ?Tyler Hendricks has no problems obtaining medication from the pharmacy.  Denies feelings of being down, depressed, or hopeless recently.  Drinks alcohol on occasion with no current recreational illicit drug use or tobacco use.  Condoms offered. ? ? ?Allergies  ?Allergen Reactions  ? Integrilin [Eptifibatide] Other (See Comments)  ?  Bleeding (non-specific)  ? Lopressor [Metoprolol Tartrate] Rash and Other (See Comments)  ?  Bleeding (non-specific)   ? Penicillins Diarrhea and Rash  ? ? ? ? ?Outpatient Medications Prior to Visit  ?Medication Sig Dispense Refill  ? amLODipine (NORVASC) 2.5 MG tablet TAKE 1 TABLET(2.5 MG) BY MOUTH DAILY 90 tablet 0  ? atenolol (TENORMIN) 50 MG tablet TAKE 1 TABLET BY MOUTH EVERY DAY 90 tablet 0  ? bictegravir-emtricitabine-tenofovir AF  (BIKTARVY) 50-200-25 MG TABS tablet Take 1 tablet by mouth daily. 30 tablet 11  ? clopidogrel (PLAVIX) 75 MG tablet TAKE 1 TABLET(75 MG) BY MOUTH DAILY Follow-up appt due must see provider for future refills 30 tablet 0  ? HYDROcodone bit-homatropine (HYCODAN) 5-1.5 MG/5ML syrup Take 5 mLs by mouth every 6 (six) hours as needed for cough. 240 mL 0  ? losartan-hydrochlorothiazide (HYZAAR) 100-25 MG tablet TAKE 1 TABLET BY MOUTH EVERY DAY 30 tablet 0  ? Multiple Vitamin (MULTIVITAMIN) tablet Take 1 tablet by mouth daily.    ? Nintedanib (OFEV) 100 MG CAPS TAKE 1 CAPSULE BY MOUTH TWICE DAILY WITH FOOD. 60 capsule 5  ? Oxycodone HCl 10 MG TABS Take 10 mg by mouth as needed.    ? rosuvastatin (CRESTOR) 10 MG tablet TAKE 1 TABLET BY MOUTH EVERY DAY 90 tablet 0  ? SYNJARDY XR 12.07-998 MG TB24 TK 2 TS PO QD 180 tablet 3  ? VASCEPA 1 g capsule TAKE 2 CAPSULES BY MOUTH 2 TIMES DAILY. 360 capsule 3  ? ?No facility-administered medications prior to visit.  ? ? ? ?Past Medical History:  ?Diagnosis Date  ? Abnormal result of cardiovascular function study, unspecified   ? Atherosclerotic heart disease of native coronary artery without angina pectoris   ? CAD (coronary artery disease)   ? Coronary angioplasty status   ? Diabetes mellitus without complication ([place])   ? Essential (primary) hypertension   ? Gallstones   ? Hepatic cirrhosis ([place])   ? Hepatitis C   ? HIV infection ([place])   ? Hyperlipidemia   ?  Hypertension   ? Internal hemorrhoids   ? Kidney stones   ? Lung disease   ? Mixed hyperlipidemia   ? Palpitations   ? Sleep apnea   ? CPAP  ? Tubular adenoma of colon   ? ? ? ?Past Surgical History:  ?Procedure Laterality Date  ? CARDIAC CATHETERIZATION Left 04/2013  ? chlecystectomy    ? COLONOSCOPY  05/02/2013  ? PERCUTANEOUS CORONARY STENT INTERVENTION (PCI-S)    ? POLYPECTOMY    ? UMBILICAL HERNIA REPAIR    ? ? ? ? ?Review of Systems  ?Constitutional:  Negative for appetite change, chills, fatigue, fever and unexpected weight  change.  ?Eyes:  Negative for visual disturbance.  ?Respiratory:  Negative for cough, chest tightness, shortness of breath and wheezing.   ?Cardiovascular:  Negative for chest pain and leg swelling.  ?Gastrointestinal:  Negative for abdominal pain, constipation, diarrhea, nausea and vomiting.  ?Genitourinary:  Negative for dysuria, flank pain, frequency, genital sores, hematuria and urgency.  ?Skin:  Negative for rash.  ?Allergic/Immunologic: Negative for immunocompromised state.  ?Neurological:  Negative for dizziness and headaches.  ?   ?Objective:  ?  ?BP 129/76   Pulse (!) 59   Resp 16   Ht '5\' 11"'$  (1.803 m)   Wt 205 lb (93 kg)   SpO2 93%   BMI 28.59 kg/m?  ?Nursing note and vital signs reviewed. ? ?Physical Exam ?Constitutional:   ?   General: He is not in acute distress. ?   Appearance: He is well-developed.  ?Eyes:  ?   Conjunctiva/sclera: Conjunctivae normal.  ?Cardiovascular:  ?   Rate and Rhythm: Normal rate and regular rhythm.  ?   Heart sounds: Normal heart sounds. No murmur heard. ?  No friction rub. No gallop.  ?Pulmonary:  ?   Effort: Pulmonary effort is normal. No respiratory distress.  ?   Breath sounds: Normal breath sounds. No wheezing or rales.  ?Chest:  ?   Chest wall: No tenderness.  ?Abdominal:  ?   General: Bowel sounds are normal.  ?   Palpations: Abdomen is soft.  ?   Tenderness: There is no abdominal tenderness.  ?Musculoskeletal:  ?   Cervical back: Neck supple.  ?Lymphadenopathy:  ?   Cervical: No cervical adenopathy.  ?Skin: ?   General: Skin is warm and dry.  ?   Findings: No rash.  ?Neurological:  ?   Mental Status: He is alert and oriented to person, place, and time.  ?Psychiatric:     ?   Behavior: Behavior normal.     ?   Thought Content: Thought content normal.     ?   Judgment: Judgment normal.  ? ? ? ? ?  07/18/2021  ?  9:34 AM 12/27/2020  ?  9:20 AM 12/06/2020  ?  2:46 PM 05/24/2020  ? 10:02 AM 12/08/2019  ? 10:40 AM  ?Depression screen PHQ 2/9  ?Decreased Interest 0 0 0 0 0   ?Down, Depressed, Hopeless 0 0 0 0 0  ?PHQ - 2 Score 0 0 0 0 0  ?  ?   ?Assessment & Plan:  ? ? ?Patient Active Problem List  ? Diagnosis Date Noted  ? Neural foraminal stenosis of cervical spine 01/03/2020  ? Chronic chest wall pain 11/16/2019  ? Routine general medical examination at a health care facility 11/16/2019  ? Hyperlipidemia LDL goal <70 11/15/2019  ? Benign prostatic hyperplasia without lower urinary tract symptoms 11/15/2019  ? Interstitial pulmonary disease ([place])  07/18/2019  ? Chronic bilateral low back pain without sciatica 03/02/2019  ? Muscle cramps 03/02/2019  ? Cirrhosis of liver without ascites ([place]) 01/08/2019  ? Secondary esophageal varices without bleeding ([place]) 01/08/2019  ? Intestinal metaplasia of gastric mucosa 01/08/2019  ? Hepatitis B core antibody positive 01/08/2019  ? Chronic cough 06/24/2018  ? Healthcare maintenance 05/11/2018  ? Hx of colonic polyp 04/27/2018  ? Change in bowel habit 04/27/2018  ? Ventral hernia without obstruction or gangrene 04/27/2018  ? Thrombocytopenia ([place]) 04/27/2018  ? Hypersplenism 04/27/2018  ? Positive hepatitis C antibody test 04/27/2018  ? Coronary artery disease without angina pectoris 02/25/2018  ? Obstructive sleep apnea 10/25/2017  ? Extrinsic asthma without complication 12/13/3005  ? Vitamin D deficiency 08/25/2017  ? HIV disease ([place]) 04/15/2017  ? Primary osteoarthritis of both knees 04/15/2017  ? Type 2 diabetes mellitus with hyperglycemia, without long-term current use of insulin ([place]) 04/15/2017  ? Essential hypertension 04/15/2017  ? ? ? ?Problem List Items Addressed This Visit   ? ?  ? Other  ? HIV disease ([place])  ?  Mr. Rock continues to have well-controlled virus with good adherence and tolerance to Biktarvy.  We reviewed lab work and discussed plan of care.  Continue current dose of Biktarvy.  Plan for follow-up in 6 months or sooner if needed with lab work 1 to 2 weeks prior to appointment. ? ?  ?  ? Healthcare maintenance  ?   Discussed importance of safe sexual practices and condom use.  Condoms offered. ? ?  ?  ? ? ? ?I am having Marti Hendricks maintain his multivitamin, Synjardy XR, Oxycodone HCl, Biktarvy, atenolol, rosuvastatin, amLODipin

## 2021-07-22 ENCOUNTER — Ambulatory Visit (INDEPENDENT_AMBULATORY_CARE_PROVIDER_SITE_OTHER): Payer: Medicare Other | Admitting: Pulmonary Disease

## 2021-07-22 ENCOUNTER — Encounter: Payer: Self-pay | Admitting: Pulmonary Disease

## 2021-07-22 VITALS — BP 130/66 | HR 58 | Temp 97.8°F | Ht 72.0 in | Wt 206.0 lb

## 2021-07-22 DIAGNOSIS — J84112 Idiopathic pulmonary fibrosis: Secondary | ICD-10-CM | POA: Diagnosis not present

## 2021-07-22 DIAGNOSIS — I2510 Atherosclerotic heart disease of native coronary artery without angina pectoris: Secondary | ICD-10-CM

## 2021-07-22 DIAGNOSIS — Z5181 Encounter for therapeutic drug level monitoring: Secondary | ICD-10-CM

## 2021-07-22 LAB — PULMONARY FUNCTION TEST
DL/VA % pred: 67 %
DL/VA: 2.71 ml/min/mmHg/L
DLCO cor % pred: 54 %
DLCO cor: 14.94 ml/min/mmHg
DLCO unc % pred: 57 %
DLCO unc: 15.89 ml/min/mmHg
FEF 25-75 Post: 3.78 L/sec
FEF 25-75 Pre: 2.86 L/sec
FEF2575-%Change-Post: 31 %
FEF2575-%Pred-Post: 141 %
FEF2575-%Pred-Pre: 107 %
FEV1-%Change-Post: 6 %
FEV1-%Pred-Post: 85 %
FEV1-%Pred-Pre: 80 %
FEV1-Post: 3.01 L
FEV1-Pre: 2.82 L
FEV1FVC-%Change-Post: 1 %
FEV1FVC-%Pred-Pre: 108 %
FEV6-%Change-Post: 5 %
FEV6-%Pred-Post: 81 %
FEV6-%Pred-Pre: 78 %
FEV6-Post: 3.7 L
FEV6-Pre: 3.52 L
FEV6FVC-%Pred-Post: 105 %
FEV6FVC-%Pred-Pre: 105 %
FVC-%Change-Post: 5 %
FVC-%Pred-Post: 77 %
FVC-%Pred-Pre: 73 %
FVC-Post: 3.7 L
FVC-Pre: 3.52 L
Post FEV1/FVC ratio: 81 %
Post FEV6/FVC ratio: 100 %
Pre FEV1/FVC ratio: 80 %
Pre FEV6/FVC Ratio: 100 %
RV % pred: 69 %
RV: 1.79 L
TLC % pred: 78 %
TLC: 5.84 L

## 2021-07-22 NOTE — Patient Instructions (Signed)
Performed full PFT today. ?

## 2021-07-22 NOTE — Progress Notes (Signed)
? ?      ?Tyler Hendricks    [id number]    February 18, 1951 ? ?Primary Care Physician:Crawford, Real Cons, MD ? ?Referring Physician: Hoyt Koch, MD ?[address] ?[place] [id number] ? ?Problem list: ?Follow-up for pulmonary fibrosis ?Started on Ofev in late June 2022.  ? ?HPI: ?71 year old with history of coronary artery disease, HIV, hepatitis C, cirrhosis ?Complains of chronic cough for the past several years. He has paroxysms of cough associated with dyspnea. Mild chest congestion ?History notable for COVID-19 infection in December 2020. He had very mild symptoms and did not require hospitalization. ? ?CT scan with probable UIP pattern pulmonary fibrosis. He has been referred to the ILD clinic for further evaluation ? ?Pets: Has a dog. No cats, birds, farm animals ?Occupation: Retired Development worker, community ?Exposures: No known exposures. No mold, hot tub, Jacuzzi. No down pillows or comforters ?ILD questionnaire 10/03/2019-negative ?Smoking history: 60-pack-year smoker. Quit in late 1990s ?Travel history: Originally from [place]. Moved to [place] in 2018 ?Relevant family history: Mother had emphysema. She was a heavy smoker. ? ?Interim history: ?Taking Ofev daily.  He is tolerating it well with minimal diarrhea ?Continues to have coughing which is improved somewhat with Hycodan cough syrup ? ?Here for review of CT and PFTs. ? ?Outpatient Encounter Medications as of 07/22/2021  ?Medication Sig  ? amLODipine (NORVASC) 2.5 MG tablet TAKE 1 TABLET(2.5 MG) BY MOUTH DAILY  ? atenolol (TENORMIN) 50 MG tablet TAKE 1 TABLET BY MOUTH EVERY DAY  ? bictegravir-emtricitabine-tenofovir AF (BIKTARVY) 50-200-25 MG TABS tablet Take 1 tablet by mouth daily.  ? clopidogrel (PLAVIX) 75 MG tablet TAKE 1 TABLET(75 MG) BY MOUTH DAILY Follow-up appt due must see provider for future refills  ? HYDROcodone bit-homatropine (HYCODAN) 5-1.5 MG/5ML syrup Take 5 mLs by mouth every 6 (six) hours as needed  for cough.  ? losartan-hydrochlorothiazide (HYZAAR) 100-25 MG tablet TAKE 1 TABLET BY MOUTH EVERY DAY  ? Multiple Vitamin (MULTIVITAMIN) tablet Take 1 tablet by mouth daily.  ? Nintedanib (OFEV) 100 MG CAPS TAKE 1 CAPSULE BY MOUTH TWICE DAILY WITH FOOD.  ? Oxycodone HCl 10 MG TABS Take 10 mg by mouth as needed.  ? rosuvastatin (CRESTOR) 10 MG tablet TAKE 1 TABLET BY MOUTH EVERY DAY  ? SYNJARDY XR 12.07-998 MG TB24 TK 2 TS PO QD  ? VASCEPA 1 g capsule TAKE 2 CAPSULES BY MOUTH 2 TIMES DAILY.  ? ?No facility-administered encounter medications on file as of 07/22/2021.  ? ? ?Physical Exam: ?Blood pressure 130/66, pulse (!) 58, temperature 97.8 ?F (36.6 ?C), temperature source Oral, height 6' (1.829 m), weight 206 lb (93.4 kg), SpO2 92 %. ?Gen:      No acute distress ?HEENT:  EOMI, sclera anicteric ?Neck:     No masses; no thyromegaly ?Lungs:    Bibasal crackles ?CV:         Regular rate and rhythm; no murmurs ?Abd:      + bowel sounds; soft, non-tender; no palpable masses, no distension ?Ext:    No edema; adequate peripheral perfusion ?Skin:      Warm and dry; no rash ?Neuro: alert and oriented x 3 ?Psych: normal mood and affect  ? ?Data Reviewed: ?Imaging: ?High-res CT 07/13/2019-groundglass attenuation with septal thickening, cylindrical bronchiectasis with mild basal gradient ?Probable UIP pattern ?High-res CT 06/24/2020-stable pulmonary fibrosis and probable UIP pattern ?I have reviewed the images personally ? ?PFTs: ?09/01/2019 ?FVC 3.78 [78%], FEV1 3.14 [87%], F/F 83, TLC  6.40 [86%], DLCO 22.76 [81%] ?Normal test ? ?06/28/2020 ?FVC 3.70 [71%], FEV1 3.05 [85%], F/F 82, TLC 5.76 [79%], DLCO 19.32 [69%] ?Minimal restriction, mild diffusion defect ? ?07/22/2021 ?FVC 3.70 [1 7%], FEV1 3.01 [85%], F/F 81, TLC 5.84 [78%], DLCO 15.89 [57%] ?Minimal restriction, moderate diffusion defect ? ?Labs: ?CTD serologies 08/30/2019-positive for ANA 1:40, nuclear, speckled ? ?Hepatic panel 07/10/2021- significant for bilirubin of  3.8 ? ?Sleep: ?CPAP download 11/25/2019-2% compliance ? ?Assessment:  ?Pulmonary fibrosis, IPF ?Has probable UIP pattern on imaging ?There is no symptoms of connective tissue disease. Borderline positive ANA is likely nonspecific ?He did have Covid in December 2020 but it was a very mild case with no hospitalization, besides his symptoms preceded the Covid infection ? ?He likely has IPF based on CT appearance in male smoker. ?Discussed further work-up such as bronchoscopy, lung biopsy but he wants to avoid invasive procedures ? ?After multiple discussions informed decision making with the patient we have started treatment with Ofev which is tolerating well so far.  We need to be cautious given history of cirrhosis ?CT shows stable pulmonary fibrosis but he does have a drop in diffusion capacity which will monitor ?Repeat LFTs later this month. ? ?Chronic cough ?Secondary to pulmonary fibrosis.  He is getting Hycodan cough syrup ?We discussed coming off Hycodan as he is also on oxy for chronic pain and he is trying to limit use of cough medication as much as possible. ?Discussed use of PPI for treatment of silent reflux though he does not have overt symptoms but he does not want to be on multiple medications ? ?Severe OSA ?Noncompliant with CPAP.  Encouraged him to use it daily. ? ?Health maintenance ?Up-to-date with Covid booster and flu vaccine ? ?Plan/Recommendations: ?Continue Ofev ?Continue lab monitoring ? ?Marshell Garfinkel MD ?[facility] ?07/22/2021, 1:53 PM ? ?CC: Hoyt Koch, * ? ? ?

## 2021-07-22 NOTE — Patient Instructions (Signed)
I am glad you are doing well with your breathing.  His CT scan shows stable scarring in the lung which is good news.  There is minimal changes on your lung function test ?We will make sure that you have a comprehensive metabolic panel ordered for mid May ?Follow-up in 6 months ?

## 2021-07-22 NOTE — Progress Notes (Signed)
Performed full PFT today. ?

## 2021-07-24 ENCOUNTER — Other Ambulatory Visit: Payer: Self-pay | Admitting: Internal Medicine

## 2021-08-06 ENCOUNTER — Telehealth: Payer: Self-pay | Admitting: Pulmonary Disease

## 2021-08-07 ENCOUNTER — Telehealth: Payer: Self-pay

## 2021-08-07 ENCOUNTER — Other Ambulatory Visit (HOSPITAL_COMMUNITY): Payer: Self-pay

## 2021-08-07 MED ORDER — HYDROCODONE BIT-HOMATROP MBR 5-1.5 MG/5ML PO SOLN
5.0000 mL | Freq: Four times a day (QID) | ORAL | 0 refills | Status: DC | PRN
  Filled 2021-08-07: qty 240, 12d supply, fill #0

## 2021-08-07 NOTE — Telephone Encounter (Signed)
Called and spoke with patient. Patient states he needs a refill on hycodan cough syrup. Patient stated that he needs it called in to the [facility] long outpatient pharmacy.   PM, please advise.

## 2021-08-07 NOTE — Telephone Encounter (Signed)
Called and spoke with patient. Advised patient refill of cough syrup has been sent. Patient verbalized understanding.   Nothing further needed.

## 2021-08-07 NOTE — Telephone Encounter (Signed)
I have send the prescription in. Thanks

## 2021-08-07 NOTE — Telephone Encounter (Signed)
ATC patient. LVMTCB. 

## 2021-08-07 NOTE — Telephone Encounter (Signed)
PA renewal initiated automatically by CoverMyMeds.  Submitted a Prior Authorization request to [facility] for OFEV via CoverMyMeds. Will update once we receive a response.   Key: [id number]

## 2021-08-08 ENCOUNTER — Other Ambulatory Visit (HOSPITAL_COMMUNITY): Payer: Self-pay

## 2021-08-08 NOTE — Telephone Encounter (Signed)
Received notification from [facility] regarding a prior authorization for [place]. Authorization has been APPROVED from 03/23/21 to 08/07/22.   Patient must continue to fill through [facility] (pulmonary fibrosis team): [phone]  Authorization # [id number] Phone # [phone]  Knox Saliva, PharmD, MPH, BCPS, CPP Clinical Pharmacist (Rheumatology and Pulmonology)

## 2021-08-11 ENCOUNTER — Other Ambulatory Visit: Payer: Self-pay | Admitting: Internal Medicine

## 2021-08-18 ENCOUNTER — Other Ambulatory Visit: Payer: Self-pay | Admitting: Internal Medicine

## 2021-08-18 DIAGNOSIS — I10 Essential (primary) hypertension: Secondary | ICD-10-CM

## 2021-08-24 ENCOUNTER — Other Ambulatory Visit: Payer: Self-pay | Admitting: Internal Medicine

## 2021-09-01 ENCOUNTER — Other Ambulatory Visit: Payer: Self-pay | Admitting: Internal Medicine

## 2021-09-01 ENCOUNTER — Telehealth: Payer: Self-pay | Admitting: Pulmonary Disease

## 2021-09-01 NOTE — Telephone Encounter (Signed)
Called to check on status

## 2021-09-02 NOTE — Telephone Encounter (Signed)
Dr. Vaughan Browner, please advise if you are okay refilling pt's cough med [facility] [address]

## 2021-09-03 ENCOUNTER — Telehealth: Payer: Self-pay | Admitting: Internal Medicine

## 2021-09-03 NOTE — Telephone Encounter (Signed)
Pt called in for refill on  clopidogrel (PLAVIX) 75 MG tablet   [id number] [place] [place] [address] AT [place]:  [phone] Fax:  [phone]

## 2021-09-04 MED ORDER — HYDROCODONE BIT-HOMATROP MBR 5-1.5 MG/5ML PO SOLN: 5.0000 mL | Freq: Four times a day (QID) | ORAL | 0 refills | Status: DC | PRN

## 2021-09-04 MED ORDER — CLOPIDOGREL BISULFATE 75 MG PO TABS: ORAL_TABLET | ORAL | 0 refills | Status: DC

## 2021-09-04 NOTE — Telephone Encounter (Signed)
Ok. I renewed the medication

## 2021-09-04 NOTE — Telephone Encounter (Signed)
Called patient to make an f/u appt. Patient has an appt on 09/17/2021.  Please advise if ok to send in 30 day for patient. Made patient aware that he has to come to his appt in order receive additional refills

## 2021-09-04 NOTE — Telephone Encounter (Signed)
I called the patient and he voices understanding. Nothing further needed.  °

## 2021-09-04 NOTE — Telephone Encounter (Signed)
[place] for 90 days, he was in Aug 2022 so not due for year follow up visit until then.

## 2021-09-09 ENCOUNTER — Other Ambulatory Visit (HOSPITAL_COMMUNITY): Payer: Self-pay

## 2021-09-10 ENCOUNTER — Other Ambulatory Visit (INDEPENDENT_AMBULATORY_CARE_PROVIDER_SITE_OTHER): Payer: Medicare Other

## 2021-09-10 ENCOUNTER — Other Ambulatory Visit: Payer: Self-pay | Admitting: *Deleted

## 2021-09-10 DIAGNOSIS — Z5181 Encounter for therapeutic drug level monitoring: Secondary | ICD-10-CM

## 2021-09-10 DIAGNOSIS — J84112 Idiopathic pulmonary fibrosis: Secondary | ICD-10-CM

## 2021-09-10 LAB — COMPREHENSIVE METABOLIC PANEL
ALT: 24 U/L (ref 0–53)
AST: 28 U/L (ref 0–37)
Albumin: 3.7 g/dL (ref 3.5–5.2)
Alkaline Phosphatase: 117 U/L (ref 39–117)
BUN: 15 mg/dL (ref 6–23)
CO2: 26 mEq/L (ref 19–32)
Calcium: 9.5 mg/dL (ref 8.4–10.5)
Chloride: 105 mEq/L (ref 96–112)
Creatinine, Ser: 0.65 mg/dL (ref 0.40–1.50)
GFR: 95.15 mL/min (ref >60.00)
Glucose, Bld: 277 mg/dL — ABNORMAL HIGH (ref 70–99)
Potassium: 3.9 mEq/L (ref 3.5–5.1)
Sodium: 138 mEq/L (ref 135–145)
Total Bilirubin: 2.7 mg/dL — ABNORMAL HIGH (ref 0.2–1.2)
Total Protein: 6.5 g/dL (ref 6.0–8.3)

## 2021-09-12 ENCOUNTER — Ambulatory Visit: Payer: Medicare Other | Admitting: Podiatry

## 2021-09-16 ENCOUNTER — Other Ambulatory Visit: Payer: Self-pay | Admitting: Internal Medicine

## 2021-09-17 ENCOUNTER — Ambulatory Visit: Payer: Medicare Other | Admitting: Internal Medicine

## 2021-09-18 ENCOUNTER — Other Ambulatory Visit (HOSPITAL_COMMUNITY): Payer: Self-pay

## 2021-09-19 ENCOUNTER — Ambulatory Visit (INDEPENDENT_AMBULATORY_CARE_PROVIDER_SITE_OTHER): Payer: Medicare Other | Admitting: Internal Medicine

## 2021-09-19 ENCOUNTER — Encounter: Payer: Self-pay | Admitting: Internal Medicine

## 2021-09-19 DIAGNOSIS — D696 Thrombocytopenia, unspecified: Secondary | ICD-10-CM

## 2021-09-19 DIAGNOSIS — E118 Type 2 diabetes mellitus with unspecified complications: Secondary | ICD-10-CM | POA: Diagnosis not present

## 2021-09-19 DIAGNOSIS — I10 Essential (primary) hypertension: Secondary | ICD-10-CM

## 2021-09-19 DIAGNOSIS — E785 Hyperlipidemia, unspecified: Secondary | ICD-10-CM | POA: Diagnosis not present

## 2021-09-19 DIAGNOSIS — E781 Pure hyperglyceridemia: Secondary | ICD-10-CM | POA: Diagnosis not present

## 2021-09-19 DIAGNOSIS — I2510 Atherosclerotic heart disease of native coronary artery without angina pectoris: Secondary | ICD-10-CM

## 2021-09-19 MED ORDER — SYNJARDY XR 12.5-1000 MG PO TB24: 2.0000 | ORAL_TABLET | Freq: Every day | ORAL | 3 refills | Status: DC

## 2021-09-19 MED ORDER — ATENOLOL 50 MG PO TABS: 50.0000 mg | ORAL_TABLET | Freq: Every day | ORAL | 3 refills | Status: DC

## 2021-09-19 MED ORDER — CLOPIDOGREL BISULFATE 75 MG PO TABS
75.0000 mg | ORAL_TABLET | Freq: Every day | ORAL | 3 refills | Status: DC
Start: 2021-09-19 — End: 2022-11-16

## 2021-09-19 MED ORDER — ROSUVASTATIN CALCIUM 10 MG PO TABS
10.0000 mg | ORAL_TABLET | Freq: Every day | ORAL | 3 refills | Status: DC
Start: 2021-09-19 — End: 2022-11-03

## 2021-09-19 MED ORDER — VASCEPA 1 G PO CAPS: 2.0000 g | ORAL_CAPSULE | Freq: Two times a day (BID) | ORAL | 3 refills | Status: DC

## 2021-09-19 MED ORDER — LOSARTAN POTASSIUM-HCTZ 100-25 MG PO TABS: 1.0000 | ORAL_TABLET | Freq: Every day | ORAL | 3 refills | Status: DC

## 2021-09-19 MED ORDER — AMLODIPINE BESYLATE 2.5 MG PO TABS: 2.5000 mg | ORAL_TABLET | Freq: Every day | ORAL | 3 refills | Status: DC

## 2021-09-19 NOTE — Assessment & Plan Note (Signed)
Will ask ID to check lipid panel with upcoming labs. Taking crestor 10 mg daily and adjust as needed.

## 2021-09-19 NOTE — Assessment & Plan Note (Signed)
BP at goal on current amlodipine 2.5 mg daily and atenolol 50 mg daily and losartan/hctz 100/25 mg daily. Recent labs without indication for change.

## 2021-09-19 NOTE — Assessment & Plan Note (Signed)
Continue crestor 10 mg daily and will ask ID to do lipid panel with upcoming labs.

## 2021-09-19 NOTE — Patient Instructions (Signed)
We do not need labs today we will have them checked with next lab draw.

## 2021-09-19 NOTE — Progress Notes (Signed)
   Subjective:   Patient ID: Roddie Riegler, male    DOB: Apr 12, 1950, 71 y.o.   MRN: [id number]  HPI The patient is a 71 YO man coming in for follow up.   Review of Systems  Constitutional: Negative.   HENT: Negative.    Eyes: Negative.   Respiratory:  Negative for cough, chest tightness and shortness of breath.   Cardiovascular:  Negative for chest pain, palpitations and leg swelling.  Gastrointestinal:  Negative for abdominal distention, abdominal pain, constipation, diarrhea, nausea and vomiting.  Musculoskeletal:  Positive for arthralgias and myalgias.  Skin: Negative.   Neurological:  Positive for numbness.  Psychiatric/Behavioral: Negative.      Objective:  Physical Exam Constitutional:      Appearance: He is well-developed.  HENT:     Head: Normocephalic and atraumatic.  Cardiovascular:     Rate and Rhythm: Normal rate and regular rhythm.  Pulmonary:     Effort: Pulmonary effort is normal. No respiratory distress.     Breath sounds: Normal breath sounds. No wheezing or rales.  Abdominal:     General: Bowel sounds are normal. There is no distension.     Palpations: Abdomen is soft.     Tenderness: There is no abdominal tenderness. There is no rebound.  Musculoskeletal:     Cervical back: Normal range of motion.  Skin:    General: Skin is warm and dry.     Comments: Foot exam done, small wound left foot plantar surface  Neurological:     Mental Status: He is alert and oriented to person, place, and time.     Coordination: Coordination normal.     Vitals:   09/19/21 1050  BP: 118/70  Pulse: (!) 55  Resp: 18  SpO2: 98%  Weight: 205 lb 3.2 oz (93.1 kg)  Height: 6' (1.829 m)    Assessment & Plan:

## 2021-09-19 NOTE — Assessment & Plan Note (Signed)
Overall stable on recent labs. From cirrhosis, monitor for now.

## 2021-09-19 NOTE — Assessment & Plan Note (Signed)
Will get HgA1c on upcoming labs with ID. Foot exam done and reminded about eye exam. Refilled synjardy 12.07/998 mg take 2 daily. On statin and ARB.

## 2021-09-24 ENCOUNTER — Telehealth: Payer: Self-pay | Admitting: Pulmonary Disease

## 2021-09-24 ENCOUNTER — Telehealth: Payer: Self-pay | Admitting: Acute Care

## 2021-09-24 ENCOUNTER — Ambulatory Visit (INDEPENDENT_AMBULATORY_CARE_PROVIDER_SITE_OTHER): Payer: Medicare Other | Admitting: Podiatry

## 2021-09-24 ENCOUNTER — Ambulatory Visit (INDEPENDENT_AMBULATORY_CARE_PROVIDER_SITE_OTHER): Payer: Medicare Other

## 2021-09-24 ENCOUNTER — Other Ambulatory Visit: Payer: Self-pay | Admitting: Acute Care

## 2021-09-24 DIAGNOSIS — M779 Enthesopathy, unspecified: Secondary | ICD-10-CM

## 2021-09-24 DIAGNOSIS — R053 Chronic cough: Secondary | ICD-10-CM

## 2021-09-24 DIAGNOSIS — S91332A Puncture wound without foreign body, left foot, initial encounter: Secondary | ICD-10-CM

## 2021-09-24 DIAGNOSIS — E118 Type 2 diabetes mellitus with unspecified complications: Secondary | ICD-10-CM

## 2021-09-24 MED ORDER — HYDROCODONE BIT-HOMATROP MBR 5-1.5 MG/5ML PO SOLN: 5.0000 mL | Freq: Four times a day (QID) | ORAL | 0 refills | Status: DC | PRN

## 2021-09-24 NOTE — Telephone Encounter (Signed)
Spoke with the pt  He is for refill on Hycodan  He is almost out  Dr Vaughan Browner not back in the office until 09/30/21 This is a med Dr Vaughan Browner has been prescribing for him  Last filled 09/04/21 # 210m [place]

## 2021-09-24 NOTE — Telephone Encounter (Signed)
Called pt and there was no answer-LMTCB °

## 2021-09-24 NOTE — Telephone Encounter (Signed)
Magdalen Spatz, NP to Me      09/24/21  2:38 PM I have refilled it. I only did 120 cc's, and Mannam can refill again when out as I have never seen this patient. Please remind him not to take this cough medication when he takes his oxycodone, and do not drive if sleepy.   I spoke with the pt and notified of response per Dr Vaughan Browner. He verbalized understanding. Nothing further needed.

## 2021-09-24 NOTE — Telephone Encounter (Signed)
I have attempted to call the patient with the results of their  biopsy. There was no answer. I have left a HIPPA compliant VM requesting the patient call the office for the scan results. I included the office contact information in the message. We will await his return call. If no return call we will continue to call until patient is contacted.

## 2021-09-26 NOTE — Progress Notes (Signed)
Subjective:  Patient ID: Tyler Hendricks, male    DOB: 02/03/1951,  MRN: [id number]  Chief Complaint  Patient presents with   Foot Pain    diabetic having right foot pain on ball of foot    71 y.o. male presents with the above complaint.  The patient is presents with complaint of left puncture wound to the left midfoot/forefoot.  Patient states he stepped on a thumbtack.  He states it happened few weeks ago.  He just wanted to get it checked out.  He does not have any pain.  At first he did.  He pulled the pin out.  He was given antibiotics through the emergency room.  He states he is a diabetic with last A1c of 6.5.  He denies any other acute complaint would like to discuss treatment options if any.   Review of Systems: Negative except as noted in the HPI. Denies N/V/F/Ch.  Past Medical History:  Diagnosis Date   Abnormal result of cardiovascular function study, unspecified    Atherosclerotic heart disease of native coronary artery without angina pectoris    CAD (coronary artery disease)    Coronary angioplasty status    Diabetes mellitus without complication ([facility])    Essential (primary) hypertension    Gallstones    Hepatic cirrhosis ([facility])    Hepatitis B core antibody positive 01/08/2019   Hepatitis C    HIV infection ([place])    Hyperlipidemia    Hypertension    Internal hemorrhoids    Kidney stones    Lung disease    Mixed hyperlipidemia    Palpitations    Sleep apnea    CPAP   Tubular adenoma of colon     Current Outpatient Medications:    amLODipine (NORVASC) 2.5 MG tablet, Take 1 tablet (2.5 mg total) by mouth daily. TAKE 1 TABLET BY MOUTH EVERY DAY, Disp: 90 tablet, Rfl: 3   atenolol (TENORMIN) 50 MG tablet, Take 1 tablet (50 mg total) by mouth daily., Disp: 90 tablet, Rfl: 3   bictegravir-emtricitabine-tenofovir AF (BIKTARVY) 50-200-25 MG TABS tablet, Take 1 tablet by mouth daily., Disp: 30 tablet, Rfl: 11   clopidogrel (PLAVIX) 75 MG tablet, Take 1 tablet (75 mg total)  by mouth daily., Disp: 90 tablet, Rfl: 3   HYDROcodone bit-homatropine (HYCODAN) 5-1.5 MG/5ML syrup, Take 5 mLs by mouth every 6 (six) hours as needed for cough., Disp: 120 mL, Rfl: 0   losartan-hydrochlorothiazide (HYZAAR) 100-25 MG tablet, Take 1 tablet by mouth daily., Disp: 90 tablet, Rfl: 3   Multiple Vitamin (MULTIVITAMIN) tablet, Take 1 tablet by mouth daily., Disp: , Rfl:    Nintedanib (OFEV) 100 MG CAPS, TAKE 1 CAPSULE BY MOUTH TWICE DAILY WITH FOOD., Disp: 60 capsule, Rfl: 5   Oxycodone HCl 10 MG TABS, Take 10 mg by mouth as needed., Disp: , Rfl:    rosuvastatin (CRESTOR) 10 MG tablet, Take 1 tablet (10 mg total) by mouth daily., Disp: 90 tablet, Rfl: 3   SYNJARDY XR 12.07-998 MG TB24, Take 2 tablets by mouth daily., Disp: 180 tablet, Rfl: 3   VASCEPA 1 g capsule, Take 2 capsules (2 g total) by mouth 2 (two) times daily., Disp: 360 capsule, Rfl: 3  Social History   Tobacco Use  Smoking Status Former   Packs/day: 2.00   Years: 20.00   Total pack years: 40.00   Types: Cigarettes   Start date: 41   Quit date: 1994   Years since quitting: 29.5   Passive exposure: Past  Smokeless Tobacco Never    Allergies  Allergen Reactions   Integrilin [Eptifibatide] Other (See Comments)    Bleeding (non-specific)   Lopressor [Metoprolol Tartrate] Rash and Other (See Comments)    Bleeding (non-specific)    Penicillins Diarrhea and Rash   Objective:  There were no vitals filed for this visit. There is no height or weight on file to calculate BMI. Constitutional Well developed. Well nourished.  Vascular Dorsalis pedis pulses palpable bilaterally. Posterior tibial pulses palpable bilaterally. Capillary refill normal to all digits.  No cyanosis or clubbing noted. Pedal hair growth normal.  Neurologic Normal speech. Oriented to person, place, and time. Epicritic sensation to light touch grossly present bilaterally.  Dermatologic Puncture wound noted to the left midfoot/forefoot no  signs of infection noted the wound has completely reepithelialized.  No breakdown of skin noted.  Orthopedic: Normal joint ROM without pain or crepitus bilaterally. No visible deformities. No bony tenderness.   Radiographs: None Assessment:   1. Puncture wound of left foot, initial encounter   2. Type 2 diabetes with complication [facility])    Plan:  Patient was evaluated and treated and all questions answered.  Left midfoot/forefoot puncture wound now completely reepithelialized/healed -All questions and concerns were discussed with the patient in extensive detail discussed shoe gear modification in extensive detail.  At this time if any foot and ankle issues on future of asked her to come back and see me.  There is no signs of infection or concern for wound as the wound has reepithelialized  No follow-ups on file.

## 2021-09-30 ENCOUNTER — Telehealth: Payer: Self-pay | Admitting: Pulmonary Disease

## 2021-09-30 DIAGNOSIS — R053 Chronic cough: Secondary | ICD-10-CM

## 2021-09-30 NOTE — Telephone Encounter (Signed)
Attempted to call pt but unable to reach. Left message for him to return call. °

## 2021-09-30 NOTE — Telephone Encounter (Signed)
Dr. Vaughan Browner, please advise on pt's hycodan Rx.

## 2021-10-01 MED ORDER — HYDROCODONE BIT-HOMATROP MBR 5-1.5 MG/5ML PO SOLN: 5.0000 mL | Freq: Four times a day (QID) | ORAL | 0 refills | Status: DC | PRN

## 2021-10-01 NOTE — Telephone Encounter (Signed)
I sent in a refill order. Thanks

## 2021-10-01 NOTE — Telephone Encounter (Signed)
Called patient to inform him that Dr Vaughan Browner refilled his medication. Nothing further needed

## 2021-10-08 ENCOUNTER — Other Ambulatory Visit (INDEPENDENT_AMBULATORY_CARE_PROVIDER_SITE_OTHER): Payer: Medicare Other

## 2021-10-08 DIAGNOSIS — E118 Type 2 diabetes mellitus with unspecified complications: Secondary | ICD-10-CM | POA: Diagnosis not present

## 2021-10-08 LAB — LIPID PANEL
Cholesterol: 136 mg/dL (ref 0–200)
HDL: 74.7 mg/dL (ref >39.00)
LDL Cholesterol: 47 mg/dL (ref 0–99)
NonHDL: 61.68
Total CHOL/HDL Ratio: 2
Triglycerides: 74 mg/dL (ref 0.0–149.0)
VLDL: 14.8 mg/dL (ref 0.0–40.0)

## 2021-10-08 LAB — MICROALBUMIN / CREATININE URINE RATIO
Creatinine,U: 56.7 mg/dL
Microalb Creat Ratio: 3 mg/g (ref 0.0–30.0)
Microalb, Ur: 1.7 mg/dL (ref 0.0–1.9)

## 2021-10-08 LAB — HEMOGLOBIN A1C: Hgb A1c MFr Bld: 6.7 % — ABNORMAL HIGH (ref 4.6–6.5)

## 2021-10-09 ENCOUNTER — Other Ambulatory Visit (HOSPITAL_COMMUNITY): Payer: Self-pay

## 2021-10-13 ENCOUNTER — Other Ambulatory Visit (HOSPITAL_COMMUNITY): Payer: Self-pay

## 2021-10-15 DIAGNOSIS — M48061 Spinal stenosis, lumbar region without neurogenic claudication: Secondary | ICD-10-CM | POA: Diagnosis not present

## 2021-10-15 DIAGNOSIS — Z79891 Long term (current) use of opiate analgesic: Secondary | ICD-10-CM | POA: Diagnosis not present

## 2021-10-15 DIAGNOSIS — M5416 Radiculopathy, lumbar region: Secondary | ICD-10-CM | POA: Diagnosis not present

## 2021-10-27 ENCOUNTER — Telehealth: Payer: Self-pay | Admitting: Pulmonary Disease

## 2021-10-27 DIAGNOSIS — R053 Chronic cough: Secondary | ICD-10-CM

## 2021-10-27 NOTE — Telephone Encounter (Signed)
Pt is requesting Hycodan refill. Dr. Vaughan Browner please advise.

## 2021-10-28 MED ORDER — HYDROCODONE BIT-HOMATROP MBR 5-1.5 MG/5ML PO SOLN: 5.0000 mL | Freq: Four times a day (QID) | ORAL | 0 refills | Status: DC | PRN

## 2021-10-28 NOTE — Telephone Encounter (Signed)
I sent in a refill.

## 2021-10-28 NOTE — Telephone Encounter (Signed)
I called and left a detailed message per DPR that the cough syrup was called into the pharmacy and he was asked to call back with any questions. Nothing further needed.

## 2021-10-28 NOTE — Telephone Encounter (Signed)
Called and spoke with patient, advised that Dr. Vaughan Browner is on vacation and I will check with another provider to see if they will refill his Hycodan until Dr. Vaughan Browner returns to the office.  He verbalized understanding.  Dr. Lamonte Sakai, patient is in need of a refill of his hycodan and Dr. Vaughan Browner is on vacation this week.  Would you be willing to refill enough of this until Dr. Vaughan Browner returns to the office?  Please advise.  Thank you.  Assessment:  Pulmonary fibrosis, IPF Has probable UIP pattern on imaging There is no symptoms of connective tissue disease. Borderline positive ANA is likely nonspecific He did have Covid in December 2020 but it was a very mild case with no hospitalization, besides his symptoms preceded the Covid infection   He likely has IPF based on CT appearance in male smoker. Discussed further work-up such as bronchoscopy, lung biopsy but he wants to avoid invasive procedures   After multiple discussions informed decision making with the patient we have started treatment with Ofev which is tolerating well so far.  We need to be cautious given history of cirrhosis CT shows stable pulmonary fibrosis but he does have a drop in diffusion capacity which will monitor Repeat LFTs later this month.   Chronic cough Secondary to pulmonary fibrosis.  He is getting Hycodan cough syrup We discussed coming off Hycodan as he is also on oxy for chronic pain and he is trying to limit use of cough medication as much as possible. Discussed use of PPI for treatment of silent reflux though he does not have overt symptoms but he does not want to be on multiple medications   Severe OSA Noncompliant with CPAP.  Encouraged him to use it daily.   Health maintenance Up-to-date with Covid booster and flu vaccine   Plan/Recommendations: Continue Ofev Continue lab monitoring   Marshell Garfinkel MD [facility] 07/22/2021, 1:53 PM   CC: Hoyt Koch, *           Patient Instructions by Marshell Garfinkel, MD at 07/22/2021 1:45 PM  Author: Marshell Garfinkel, MD Author Type: Physician Filed: 07/22/2021  2:04 PM  Note Status: Signed Cosign: Cosign Not Required Encounter Date: 07/22/2021  Editor: Marshell Garfinkel, MD (Physician)               I am glad you are doing well with your breathing.  His CT scan shows stable scarring in the lung which is good news.  There is minimal changes on your lung function test We will make sure that you have a comprehensive metabolic panel ordered for mid May Follow-up in 6 months       Orthostatic Vitals Recorded in This Encounter   07/22/2021  1337     Patient Position: Sitting  BP Location: Left Arm  Cuff Size: Normal   Instructions  I am glad you are doing well with your breathing.  His CT scan shows stable scarring in the lung which is good news.  There is minimal changes on your lung function test We will make sure that you have a comprehensive metabolic panel ordered for mid May Follow-up in 6 months        Allergies      very important  Allergies from outside sources need reconciliation.    Integrilin [Eptifibatide]Other (See Comments) Lopressor [Metoprolol Tartrate]Rash, Other (See Comments) PenicillinsDiarrhea, Rash

## 2021-10-28 NOTE — Addendum Note (Signed)
Addended by: Collene Gobble on: 10/28/2021 11:53 AM   Modules accepted: Orders

## 2021-11-03 ENCOUNTER — Other Ambulatory Visit (HOSPITAL_COMMUNITY): Payer: Self-pay

## 2021-11-03 ENCOUNTER — Telehealth: Payer: Self-pay | Admitting: Pulmonary Disease

## 2021-11-03 DIAGNOSIS — R053 Chronic cough: Secondary | ICD-10-CM

## 2021-11-03 NOTE — Telephone Encounter (Signed)
Called and spoke with patient. Patient stated that he needs a refill on his cough medicine.   PM, please advise

## 2021-11-04 ENCOUNTER — Other Ambulatory Visit (HOSPITAL_COMMUNITY): Payer: Self-pay

## 2021-11-04 NOTE — Telephone Encounter (Signed)
Patient has called multiple times in regards to wanting his cough medication refilled. The message was sent to Dr Vaughan Browner yesterday but he is out of office. Looks like is was last filled on 10/28/2021 by Dr Vaughan Browner. Lessie Dings would you be willing to send in   Hycodan for this patient.  Please advise

## 2021-11-04 NOTE — Telephone Encounter (Signed)
Since Dr Vaughan Browner bck tomorrow 11/05/21 defer to Dr Vaughan Browner

## 2021-11-05 DIAGNOSIS — Z79899 Other long term (current) drug therapy: Secondary | ICD-10-CM | POA: Diagnosis not present

## 2021-11-05 DIAGNOSIS — Z5181 Encounter for therapeutic drug level monitoring: Secondary | ICD-10-CM | POA: Diagnosis not present

## 2021-11-05 MED ORDER — HYDROCODONE BIT-HOMATROP MBR 5-1.5 MG/5ML PO SOLN: 5.0000 mL | Freq: Four times a day (QID) | ORAL | 0 refills | Status: DC | PRN

## 2021-11-05 NOTE — Telephone Encounter (Signed)
I called in a refill

## 2021-11-05 NOTE — Telephone Encounter (Signed)
I called the patient and let him know that Dr. Vaughan Browner had sent in the refill for the full amount per the patient request. Nothing further needed.

## 2021-11-10 ENCOUNTER — Other Ambulatory Visit (HOSPITAL_COMMUNITY): Payer: Self-pay

## 2021-11-20 NOTE — Progress Notes (Signed)
Cardiology Office Note   Date:  11/21/2021   ID:  Tyler Hendricks, DOB October 14, 1950, MRN [id number]  PCP:  Hoyt Koch, MD    No chief complaint on file.  CAD  Wt Readings from Last 3 Encounters:  11/21/21 204 lb 9.6 oz (92.8 kg)  09/19/21 205 lb 3.2 oz (93.1 kg)  07/22/21 206 lb (93.4 kg)       History of Present Illness: Tyler Hendricks is a 71 y.o. male  Who has multivessel stents placed in 2000 while in [place].  He has  Ah/o HIV as well, since 1989.  He is compliant with meds.  Viral load is nondetectable.    OM1 3.0 x 23 mm, left posterolateral 2.5 x 13 mm, diagonal 1 2.5 x 13, ostial diagonal 2.5 mm, proximal LAD- unknown size, mid LAD 3.0 x 13  - all BMS.     Last cath was 2005.  All stents were patent and there was moderate disease in the right PDA and right PLA.   In 2010, he had a monitor showing NSR with PVCs.  He was diagnosed with moderate aortic stenosis in 2018 by echocardiogram while in [place].   He has had issues with leg cramps.  In the past, it was noted that he walks several times a day.  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   During COVID-19 quarantine, he gained weight.  He has since lost some of that weight.   Diagnosed with IPF.  Now needs to start the Ofev.  CT scan with probable UIP pattern pulmonary fibrosis. He has been referred to the ILD clinic for further evaluation.  Walks his dog 3 x/day.  Denies : Chest pain. Dizziness. Leg edema. Nitroglycerin use. Orthopnea. Palpitations. Paroxysmal nocturnal dyspnea. Syncope.    Working 4 days/week on a golf course.    Past Medical History:  Diagnosis Date   Abnormal result of cardiovascular function study, unspecified    Atherosclerotic heart disease of native coronary artery without angina pectoris    CAD (coronary artery disease)    Coronary angioplasty status    Diabetes mellitus without complication ([place])    Essential (primary) hypertension    Gallstones     Hepatic cirrhosis (HCC)    Hepatitis B core antibody positive 01/08/2019   Hepatitis C    HIV infection ([place])    Hyperlipidemia    Hypertension    Internal hemorrhoids    Kidney stones    Lung disease    Mixed hyperlipidemia    Palpitations    Sleep apnea    CPAP   Tubular adenoma of colon     Past Surgical History:  Procedure Laterality Date   CARDIAC CATHETERIZATION Left 04/2013   chlecystectomy     COLONOSCOPY  05/02/2013   PERCUTANEOUS CORONARY STENT INTERVENTION (PCI-S)     POLYPECTOMY     UMBILICAL HERNIA REPAIR       Current Outpatient Medications  Medication Sig Dispense Refill   amLODipine (NORVASC) 2.5 MG tablet Take 1 tablet (2.5 mg total) by mouth daily. TAKE 1 TABLET BY MOUTH EVERY DAY 90 tablet 3   atenolol (TENORMIN) 50 MG tablet Take 1 tablet (50 mg total) by mouth daily. 90 tablet 3   bictegravir-emtricitabine-tenofovir AF (BIKTARVY) 50-200-25 MG TABS tablet Take 1 tablet by mouth daily. 30 tablet 11   clopidogrel (PLAVIX) 75 MG tablet Take 1 tablet (75 mg total) by mouth daily. 90 tablet 3  HYDROcodone bit-homatropine (HYCODAN) 5-1.5 MG/5ML syrup Take 5 mLs by mouth every 6 (six) hours as needed for cough. 120 mL 0   losartan-hydrochlorothiazide (HYZAAR) 100-25 MG tablet Take 1 tablet by mouth daily. 90 tablet 3   Multiple Vitamin (MULTIVITAMIN) tablet Take 1 tablet by mouth daily.     Nintedanib (OFEV) 100 MG CAPS TAKE 1 CAPSULE BY MOUTH TWICE DAILY WITH FOOD. 60 capsule 5   Oxycodone HCl 10 MG TABS Take 10 mg by mouth as needed.     rosuvastatin (CRESTOR) 10 MG tablet Take 1 tablet (10 mg total) by mouth daily. 90 tablet 3   SYNJARDY XR 12.07-998 MG TB24 Take 2 tablets by mouth daily. 180 tablet 3   VASCEPA 1 g capsule Take 2 capsules (2 g total) by mouth 2 (two) times daily. 360 capsule 3   No current facility-administered medications for this visit.    Allergies:   Integrilin [eptifibatide], Lopressor [metoprolol tartrate], and Penicillins     Social History:  The patient  reports that he quit smoking about 29 years ago. His smoking use included cigarettes. He started smoking about 49 years ago. He has a 40.00 pack-year smoking history. He has been exposed to tobacco smoke. He has never used smokeless tobacco. He reports current alcohol use. He reports that he does not use drugs.   Family History:  The patient's family history includes Emphysema in his mother; Esophageal cancer in his brother and brother; Heart attack (age of onset: 69) in his father; Lung cancer in his sister.    ROS:  Please see the history of present illness.   Otherwise, review of systems are positive for DOE, unhealthy dietary choices- eating less.   All other systems are reviewed and negative.    PHYSICAL EXAM: VS:  BP 118/70   Pulse (!) 54   Ht 6' (1.829 m)   Wt 204 lb 9.6 oz (92.8 kg)   SpO2 97%   BMI 27.75 kg/m  , BMI Body mass index is 27.75 kg/m. GEN: Well nourished, well developed, in no acute distress HEENT: normal Neck: no JVD, carotid bruits, or masses Cardiac: bradycardic; no murmurs, rubs, or gallops,no edema  Respiratory:  clear to auscultation bilaterally, normal work of breathing GI: soft, nontender, nondistended, + BS MS: no deformity or atrophy Skin: warm and dry, no rash Neuro:  Strength and sensation are intact Psych: euthymic mood, full affect   EKG:   The ekg ordered today demonstrates sinus bradycardia, RBBB   Recent Labs: 05/20/2021: NT-Pro BNP 76 07/10/2021: Hemoglobin 17.1; Platelets 112.0 09/10/2021: ALT 24; BUN 15; Creatinine, Ser 0.65; Potassium 3.9; Sodium 138   Lipid Panel    Component Value Date/Time   CHOL 136 10/08/2021 1012   TRIG 74.0 10/08/2021 1012   HDL 74.70 10/08/2021 1012   CHOLHDL 2 10/08/2021 1012   VLDL 14.8 10/08/2021 1012   LDLCALC 47 10/08/2021 1012   LDLCALC 59 12/06/2020 1114     Other studies Reviewed: Additional studies/ records that were reviewed today with results demonstrating:  LDL 47, HDL 74, triglycerides 74, A1c 6.7 in July 2023.   ASSESSMENT AND PLAN:  CAD: No angina on medical therapy.  Continue aggressive secondary prevention.  No bleeding on clopidogrel. HTN: The current medical regimen is effective;  continue present plan and medications. DM: A1C 6.7.  We spoke about healthy diet.  His appetite is decreased in general. Aortic stenosis: Moderate in 6/22.  Drinking plenty of water when on the job.  No symptoms  of severe aortic stenosis at this time. HIV: Controlled per his report.  Nondetectable virus in April 2023   Current medicines are reviewed at length with the patient today.  The patient concerns regarding his medicines were addressed.  The following changes have been made:  No change  Labs/ tests ordered today include:  No orders of the defined types were placed in this encounter.   Recommend 150 minutes/week of aerobic exercise Low fat, low carb, high fiber diet recommended  Disposition:   FU in 1 year   Signed, Larae Grooms, MD  11/21/2021 2:18 PM    [facility] [address], [place], [place]  [address] Phone: [phone]; Fax: [phone]

## 2021-11-21 ENCOUNTER — Encounter: Payer: Self-pay | Admitting: Interventional Cardiology

## 2021-11-21 ENCOUNTER — Ambulatory Visit: Payer: Medicare Other | Attending: Interventional Cardiology | Admitting: Interventional Cardiology

## 2021-11-21 VITALS — BP 118/70 | HR 54 | Ht 72.0 in | Wt 204.6 lb

## 2021-11-21 DIAGNOSIS — I10 Essential (primary) hypertension: Secondary | ICD-10-CM

## 2021-11-21 DIAGNOSIS — I350 Nonrheumatic aortic (valve) stenosis: Secondary | ICD-10-CM

## 2021-11-21 DIAGNOSIS — I25118 Atherosclerotic heart disease of native coronary artery with other forms of angina pectoris: Secondary | ICD-10-CM | POA: Diagnosis not present

## 2021-11-21 DIAGNOSIS — B20 Human immunodeficiency virus [HIV] disease: Secondary | ICD-10-CM | POA: Insufficient documentation

## 2021-11-21 DIAGNOSIS — E1165 Type 2 diabetes mellitus with hyperglycemia: Secondary | ICD-10-CM

## 2021-11-21 NOTE — Patient Instructions (Signed)
Medication Instructions:  Your physician recommends that you continue on your current medications as directed. Please refer to the Current Medication list given to you today.  *If you need a refill on your cardiac medications before your next appointment, please call your pharmacy*   Lab Work: none If you have labs (blood work) drawn today and your tests are completely normal, you will receive your results only by: MyChart Message (if you have MyChart) OR A paper copy in the mail If you have any lab test that is abnormal or we need to change your treatment, we will call you to review the results.   Testing/Procedures: none   Follow-Up: At [facility], you and your health needs are our priority.  As part of our continuing mission to provide you with exceptional heart care, we have created designated Provider Care Teams.  These Care Teams include your primary Cardiologist (physician) and Advanced Practice Providers (APPs -  Physician Assistants and Nurse Practitioners) who all work together to provide you with the care you need, when you need it.  We recommend signing up for the patient portal called "MyChart".  Sign up information is provided on this After Visit Summary.  MyChart is used to connect with patients for Virtual Visits (Telemedicine).  Patients are able to view lab/test results, encounter notes, upcoming appointments, etc.  Non-urgent messages can be sent to your provider as well.   To learn more about what you can do with MyChart, go to [email or web address].    Your next appointment:   12 month(s)  The format for your next appointment:   In Person  Provider:   Jayadeep Varanasi, MD     Other Instructions    Important Information About Sugar       

## 2021-11-27 ENCOUNTER — Telehealth: Payer: Self-pay | Admitting: Pulmonary Disease

## 2021-11-27 DIAGNOSIS — J84112 Idiopathic pulmonary fibrosis: Secondary | ICD-10-CM

## 2021-11-27 NOTE — Telephone Encounter (Signed)
Refill sent for OFEV to [facility] (pulmonary fibrosis team): [phone]  Dose: 100 mg twice daily  Last OV: 07/22/21 Provider: Dr. Dionisio Paschal on 09/10/21 stable  Next OV: 6 months, not yet scheduled  Knox Saliva, PharmD, MPH, BCPS Clinical Pharmacist (Rheumatology and Pulmonology)

## 2021-11-28 ENCOUNTER — Telehealth: Payer: Self-pay | Admitting: Pulmonary Disease

## 2021-11-28 DIAGNOSIS — R053 Chronic cough: Secondary | ICD-10-CM

## 2021-11-28 MED ORDER — HYDROCODONE BIT-HOMATROP MBR 5-1.5 MG/5ML PO SOLN: 5.0000 mL | Freq: Four times a day (QID) | ORAL | 0 refills | Status: DC | PRN

## 2021-11-28 NOTE — Telephone Encounter (Signed)
Patient requesting refill on hycodan sent to [facility] on [address]. Last refill 11/05/2021 with 0 refills. Asked if he called his pharmacy, states that they never sent the request. Patient scheduled 59monthfollow up in November.   Please call patient back at [phone].

## 2021-11-28 NOTE — Telephone Encounter (Signed)
Called pt's pharmacy to see when last Rx of pt's hycodan was received. Spoke with Santiago Glad and she stated last time Rx was received for pt's hycodan on 8/16 and pt picked Rx up the same day.    Pt is calling asking for another refill of the hycodan. Dr. Vaughan Browner, please advise.

## 2021-11-28 NOTE — Telephone Encounter (Signed)
Called and spoke to patient and informed him that Dr Vaughan Browner has sent in his refill. Nothing further needed

## 2021-11-28 NOTE — Telephone Encounter (Signed)
Patient called to get update on refill- informed patient once we get an approval from PM a nurse will call him.

## 2021-11-28 NOTE — Telephone Encounter (Signed)
I have sent a refill for his medication.

## 2021-12-02 ENCOUNTER — Other Ambulatory Visit (HOSPITAL_COMMUNITY): Payer: Self-pay

## 2021-12-04 NOTE — Progress Notes (Unsigned)
PATIENT: Tyler Hendricks DOB: 02/12/1951  REASON FOR VISIT: follow up HISTORY FROM: patient Primary neurologist: Dr. Rexene Alberts  HISTORY OF PRESENT ILLNESS: Today 12/08/21:   Tyler Hendricks is a 71 year old male with a history of obstructive sleep apnea on CPAP.  He reports that he has not been using the CPAP consistently.  Reports that most nights he falls asleep without putting it on.  He states the last several nights he has been waking up at 2 AM unable to get back to sleep.  At the last visit he let us know that he was diagnosed with pulmonary fibrosis.  He reports that his recent follow-up with his pulmonologist he was told there was no changes.  He has another follow-up in November.  His download is below    06/06/21: Tyler Hendricks is a 71 year old male with a history of obstructive sleep apnea on CPAP.  He returns today for follow-up.  He reports that he is still not been using his CPAP.  He does plan to restart it this weekend.  Has a diagnosis of pulmonary fibrosis will be following up with pulmonology next month.  10/29/20: Tyler Hendricks is a 71 year old male with a history of obstructive sleep apnea on CPAP.  He returns today for follow-up.  He reports that he has not been using the CPAP.  At the last visit he was also not using the machine.  States that he has some pulmonary issues as well as other things that occupy his thoughts and he forgets to use the CPAP.  According to the pulmonologist note he has been diagnosed with idiopathic pulmonary fibrosis and is currently on Ofev.  He returns today for follow-up.  04/29/20: Tyler Hendricks is a 71 year old male with a history of obstructive sleep apnea on CPAP.  He reports that he has not been using the machine for the last 6 months.  He reports that he got out of the habit of using it.  Reports that sometimes he will fall asleep on the couch and that he does not put his machine on.  He states for the last 2 days he has restarted using it.  He returns today for  evaluation.  04/26/19 Tyler Hendricks is a 71 year old male with a history of obstructive sleep apnea on CPAP.  His download indicates that he uses machine 21 out of 30 days for compliance of 70%.  He uses machine greater than 4 hours 11 days for compliance of 37%.  On average he uses his machine 4 hours and 22 minutes.  His residual AHI is 3.6 on 7 to 15 cm of water with EPR of 2.  His leak in the 95th percentile is 9.6 L/min.  He reports that there are some nights he gets up to the bathroom and does not put his machine back on.  He still feels that the CPAP works well for him.  His Epworth sleepiness score is 610 fatigue severity score is 13.  HISTORY  10/13/18: Tyler Hendricks is a 71 year old male with a history of obstructive sleep apnea on CPAP.  He returns today for follow-up.  His download indicates that he use his machine 23 out of 30 days for compliance of 77%.  He uses machine greater than 4 hours 11 days for compliance of 37%.  On average he uses his machine 3 hours and 56 minutes.  His residual AHI is 4.2 on 7 to 15 cm of water with EPR of 2.  His leak in the 95th percentile  is 9.8.  He reports that some nights he does not get more than 4 hours of sleep.  He states that he is getting more used to the mask.  His Epworth sleepiness score is 2 and fatigue severity score is 16.  He returns today for follow-up.  REVIEW OF SYSTEMS: Out of a complete 14 system review of symptoms, the patient complains only of the following symptoms, and all other reviewed systems are negative.   Epworth sleepiness score 5  ALLERGIES: Allergies  Allergen Reactions   Integrilin [Eptifibatide] Other (See Comments)    Bleeding (non-specific)   Lopressor [Metoprolol Tartrate] Rash and Other (See Comments)    Bleeding (non-specific)    Penicillins Diarrhea and Rash    HOME MEDICATIONS: Outpatient Medications Prior to Visit  Medication Sig Dispense Refill   amLODipine (NORVASC) 2.5 MG tablet Take 1 tablet (2.5 mg total) by  mouth daily. TAKE 1 TABLET BY MOUTH EVERY DAY 90 tablet 3   atenolol (TENORMIN) 50 MG tablet Take 1 tablet (50 mg total) by mouth daily. 90 tablet 3   bictegravir-emtricitabine-tenofovir AF (BIKTARVY) 50-200-25 MG TABS tablet Take 1 tablet by mouth daily. 30 tablet 11   clopidogrel (PLAVIX) 75 MG tablet Take 1 tablet (75 mg total) by mouth daily. 90 tablet 3   HYDROcodone bit-homatropine (HYCODAN) 5-1.5 MG/5ML syrup Take 5 mLs by mouth every 6 (six) hours as needed for cough. 120 mL 0   losartan-hydrochlorothiazide (HYZAAR) 100-25 MG tablet Take 1 tablet by mouth daily. 90 tablet 3   Multiple Vitamin (MULTIVITAMIN) tablet Take 1 tablet by mouth daily.     Nintedanib (OFEV) 100 MG CAPS TAKE 1 CAPSULE BY MOUTH TWICE DAILY WITH FOOD. 60 capsule 2   Oxycodone HCl 10 MG TABS Take 10 mg by mouth as needed.     rosuvastatin (CRESTOR) 10 MG tablet Take 1 tablet (10 mg total) by mouth daily. 90 tablet 3   SYNJARDY XR 12.07-998 MG TB24 Take 2 tablets by mouth daily. 180 tablet 3   VASCEPA 1 g capsule Take 2 capsules (2 g total) by mouth 2 (two) times daily. 360 capsule 3   No facility-administered medications prior to visit.    PAST MEDICAL HISTORY: Past Medical History:  Diagnosis Date   Abnormal result of cardiovascular function study, unspecified    Atherosclerotic heart disease of native coronary artery without angina pectoris    CAD (coronary artery disease)    Coronary angioplasty status    Diabetes mellitus without complication ([place])    Essential (primary) hypertension    Gallstones    Hepatic cirrhosis (HCC)    Hepatitis B core antibody positive 01/08/2019   Hepatitis C    HIV infection ([place])    Hyperlipidemia    Hypertension    Internal hemorrhoids    Kidney stones    Lung disease    Mixed hyperlipidemia    Palpitations    Sleep apnea    CPAP   Tubular adenoma of colon     PAST SURGICAL HISTORY: Past Surgical History:  Procedure Laterality Date   CARDIAC CATHETERIZATION  Left 04/2013   chlecystectomy     COLONOSCOPY  05/02/2013   PERCUTANEOUS CORONARY STENT INTERVENTION (PCI-S)     POLYPECTOMY     UMBILICAL HERNIA REPAIR      FAMILY HISTORY: Family History  Problem Relation Age of Onset   Emphysema Mother        pulmonary   Heart attack Father 53   Lung cancer Sister  Esophageal cancer Brother    Esophageal cancer Brother    Colon cancer Neg Hx    Inflammatory bowel disease Neg Hx    Liver disease Neg Hx    Pancreatic cancer Neg Hx    Rectal cancer Neg Hx    Stomach cancer Neg Hx    Colon polyps Neg Hx    Sleep apnea Neg Hx     SOCIAL HISTORY: Social History   Socioeconomic History   Marital status: Married    Spouse name: Not on file   Number of children: 1   Years of education: Not on file   Highest education level: 10th grade  Occupational History   Occupation: retired  Tobacco Use   Smoking status: Former    Packs/day: 2.00    Years: 20.00    Total pack years: 40.00    Types: Cigarettes    Start date: 64    Quit date: 1994    Years since quitting: 29.7    Passive exposure: Past   Smokeless tobacco: Never  Vaping Use   Vaping Use: Never used  Substance and Sexual Activity   Alcohol use: Yes    Comment: social-occ beer   Drug use: No   Sexual activity: Yes  Other Topics Concern   Not on file  Social History Narrative   Not on file   Social Determinants of Health   Financial Resource Strain: Low Risk  (12/06/2020)   Overall Financial Resource Strain (CARDIA)    Difficulty of Paying Living Expenses: Not hard at all  Food Insecurity: No Food Insecurity (12/06/2020)   Hunger Vital Sign    Worried About Running Out of Food in the Last Year: Never true    Ran Out of Food in the Last Year: Never true  Transportation Needs: No Transportation Needs (12/06/2020)   PRAPARE - Hydrologist (Medical): No    Lack of Transportation (Non-Medical): No  Physical Activity: Sufficiently Active  (12/06/2020)   Exercise Vital Sign    Days of Exercise per Week: 5 days    Minutes of Exercise per Session: 30 min  Stress: No Stress Concern Present (12/06/2020)   [place]    Feeling of Stress : Not at all  Social Connections: Unknown (12/06/2020)   Social Connection and Isolation Panel [NHANES]    Frequency of Communication with Friends and Family: More than three times a week    Frequency of Social Gatherings with Friends and Family: More than three times a week    Attends Religious Services: Not on file    Active Member of [place] or Organizations: Not on file    Attends Archivist Meetings: Not on file    Marital Status: Married  Intimate Partner Violence: Not At Risk (12/06/2020)   Humiliation, Afraid, Rape, and Kick questionnaire    Fear of Current or Ex-Partner: No    Emotionally Abused: No    Physically Abused: No    Sexually Abused: No      PHYSICAL EXAM  Vitals:   12/08/21 1048  BP: 102/67  Pulse: 64  Weight: 202 lb (91.6 kg)  Height: '5\' 11"'$  (1.803 m)    Body mass index is 28.17 kg/m. Generalized: Well developed, in no acute distress    Neurological examination  Mentation: Alert oriented to time, place, history taking. Follows all commands speech and language fluent Cranial nerve II-XII: Extraocular movements were full, visual field were full  on confrontational test Head turning and shoulder shrug  were normal and symmetric. Motor: The motor testing reveals 5 over 5 strength of all 4 extremities. Good symmetric motor tone is noted throughout.  Sensory: Sensory testing is intact to soft touch on all 4 extremities. No evidence of extinction is noted.  Gait and station: Gait is normal.    DIAGNOSTIC DATA (LABS, IMAGING, TESTING) - I reviewed patient records, labs, notes, testing and imaging myself where available.  Lab Results  Component Value Date   WBC 5.2 07/10/2021   HGB 17.1 (H)  07/10/2021   HCT 50.1 07/10/2021   MCV 97.6 07/10/2021   PLT 112.0 (L) 07/10/2021      Component Value Date/Time   NA 138 09/10/2021 0916   K 3.9 09/10/2021 0916   CL 105 09/10/2021 0916   CO2 26 09/10/2021 0916   GLUCOSE 277 (H) 09/10/2021 0916   BUN 15 09/10/2021 0916   CREATININE 0.65 09/10/2021 0916   CREATININE 0.62 (L) 07/04/2021 0840   CALCIUM 9.5 09/10/2021 0916   PROT 6.5 09/10/2021 0916   ALBUMIN 3.7 09/10/2021 0916   AST 28 09/10/2021 0916   ALT 24 09/10/2021 0916   ALKPHOS 117 09/10/2021 0916   BILITOT 2.7 (H) 09/10/2021 0916   GFRNONAA 97 05/10/2020 1009   GFRAA 112 05/10/2020 1009   Lab Results  Component Value Date   CHOL 136 10/08/2021   HDL 74.70 10/08/2021   LDLCALC 47 10/08/2021   TRIG 74.0 10/08/2021   CHOLHDL 2 10/08/2021   Lab Results  Component Value Date   HGBA1C 6.7 (H) 10/08/2021   Lab Results  Component Value Date   JOACZYSA63 016 11/15/2019   Lab Results  Component Value Date   TSH 2.29 11/15/2019      ASSESSMENT AND PLAN 71 y.o. year old male  has a past medical history of Abnormal result of cardiovascular function study, unspecified, Atherosclerotic heart disease of native coronary artery without angina pectoris, CAD (coronary artery disease), Coronary angioplasty status, Diabetes mellitus without complication ([place]), Essential (primary) hypertension, Gallstones, Hepatic cirrhosis ([place]), Hepatitis B core antibody positive (01/08/2019), Hepatitis C, HIV infection ([place]), Hyperlipidemia, Hypertension, Internal hemorrhoids, Kidney stones, Lung disease, Mixed hyperlipidemia, Palpitations, Sleep apnea, and Tubular adenoma of colon. here with:  Obstructive sleep apnea on CPAP  Encouraged the patient to restart CPAP therapy Reviewed risk associated with untreated sleep apnea Follow-up in 1 year or sooner if needed  Ward Givens, MSN, NP-C 12/08/2021, 10:52 AM [facility] [address] [place], [place]  [id number] [phone]

## 2021-12-08 ENCOUNTER — Ambulatory Visit (INDEPENDENT_AMBULATORY_CARE_PROVIDER_SITE_OTHER): Payer: Medicare Other | Admitting: Adult Health

## 2021-12-08 ENCOUNTER — Encounter: Payer: Self-pay | Admitting: Adult Health

## 2021-12-08 VITALS — BP 102/67 | HR 64 | Ht 71.0 in | Wt 202.0 lb

## 2021-12-08 DIAGNOSIS — G4733 Obstructive sleep apnea (adult) (pediatric): Secondary | ICD-10-CM | POA: Diagnosis not present

## 2021-12-08 DIAGNOSIS — Z9989 Dependence on other enabling machines and devices: Secondary | ICD-10-CM

## 2021-12-08 DIAGNOSIS — I25118 Atherosclerotic heart disease of native coronary artery with other forms of angina pectoris: Secondary | ICD-10-CM | POA: Diagnosis not present

## 2021-12-15 ENCOUNTER — Ambulatory Visit: Payer: Medicare Other

## 2021-12-16 ENCOUNTER — Other Ambulatory Visit (HOSPITAL_COMMUNITY): Payer: Self-pay

## 2021-12-18 ENCOUNTER — Telehealth: Payer: Self-pay | Admitting: Pulmonary Disease

## 2021-12-18 ENCOUNTER — Other Ambulatory Visit: Payer: Self-pay | Admitting: Family

## 2021-12-18 DIAGNOSIS — R053 Chronic cough: Secondary | ICD-10-CM

## 2021-12-18 DIAGNOSIS — B20 Human immunodeficiency virus [HIV] disease: Secondary | ICD-10-CM

## 2021-12-18 NOTE — Telephone Encounter (Signed)
Patient requesting refill on hycodan sent to [facility] on [address]. Last refill 11/28/2021 with 0 refills.   Please call patient will update.

## 2021-12-19 ENCOUNTER — Other Ambulatory Visit: Payer: Self-pay

## 2021-12-19 ENCOUNTER — Ambulatory Visit (INDEPENDENT_AMBULATORY_CARE_PROVIDER_SITE_OTHER): Payer: Medicare Other

## 2021-12-19 ENCOUNTER — Ambulatory Visit (INDEPENDENT_AMBULATORY_CARE_PROVIDER_SITE_OTHER): Payer: Medicare Other | Admitting: Podiatry

## 2021-12-19 ENCOUNTER — Other Ambulatory Visit: Payer: Medicare Other

## 2021-12-19 DIAGNOSIS — B20 Human immunodeficiency virus [HIV] disease: Secondary | ICD-10-CM | POA: Diagnosis not present

## 2021-12-19 DIAGNOSIS — E118 Type 2 diabetes mellitus with unspecified complications: Secondary | ICD-10-CM | POA: Diagnosis not present

## 2021-12-19 DIAGNOSIS — L600 Ingrowing nail: Secondary | ICD-10-CM

## 2021-12-19 DIAGNOSIS — Z23 Encounter for immunization: Secondary | ICD-10-CM

## 2021-12-19 MED ORDER — HYDROCODONE BIT-HOMATROP MBR 5-1.5 MG/5ML PO SOLN: 5.0000 mL | Freq: Four times a day (QID) | ORAL | 0 refills | Status: DC | PRN

## 2021-12-19 NOTE — Telephone Encounter (Signed)
Called and spoke with patient.  Patient stated he is needing a new Hycodan refill to be sent to [facility].     Message routed to Dr. Vaughan Browner to advise

## 2021-12-19 NOTE — Telephone Encounter (Signed)
Prescription sent

## 2021-12-19 NOTE — Telephone Encounter (Signed)
Called and left message on VM (DPR) to let patient know requested prescription was sent to [facility] by Dr. Vaughan Browner.  Call back number left for any question or problems.  Nothing further at this time.

## 2021-12-19 NOTE — Progress Notes (Signed)
Subjective:  Patient ID: Tyler Hendricks, male    DOB: Dec 19, 1950,  MRN: [id number]  Chief Complaint  Patient presents with   Ingrown Toenail    71 y.o. male presents with the above complaint.  Patient presents with right hallux ingrown.  Painful to touch is progressive gotten worse worse with ambulation worse with pressure.  He would like me to remove it he has not seen MRIs prior to seeing me he is a diabetic with last A1c of 6.7.  He would like to proceed for me to take the ingrown out.  Pain scale 7 out of 10 on the arch with ambulation worse with pressure   Review of Systems: Negative except as noted in the HPI. Denies N/V/F/Ch.  Past Medical History:  Diagnosis Date   Abnormal result of cardiovascular function study, unspecified    Atherosclerotic heart disease of native coronary artery without angina pectoris    CAD (coronary artery disease)    Coronary angioplasty status    Diabetes mellitus without complication ([place])    Essential (primary) hypertension    Gallstones    Hepatic cirrhosis (HCC)    Hepatitis B core antibody positive 01/08/2019   Hepatitis C    HIV infection ([place])    Hyperlipidemia    Hypertension    Internal hemorrhoids    Kidney stones    Lung disease    Mixed hyperlipidemia    Palpitations    Sleep apnea    CPAP   Tubular adenoma of colon     Current Outpatient Medications:    amLODipine (NORVASC) 2.5 MG tablet, Take 1 tablet (2.5 mg total) by mouth daily. TAKE 1 TABLET BY MOUTH EVERY DAY, Disp: 90 tablet, Rfl: 3   atenolol (TENORMIN) 50 MG tablet, Take 1 tablet (50 mg total) by mouth daily., Disp: 90 tablet, Rfl: 3   bictegravir-emtricitabine-tenofovir AF (BIKTARVY) 50-200-25 MG TABS tablet, Take 1 tablet by mouth daily., Disp: 30 tablet, Rfl: 11   clopidogrel (PLAVIX) 75 MG tablet, Take 1 tablet (75 mg total) by mouth daily., Disp: 90 tablet, Rfl: 3   HYDROcodone bit-homatropine (HYCODAN) 5-1.5 MG/5ML syrup, Take 5 mLs by mouth every 6 (six) hours  as needed for cough., Disp: 120 mL, Rfl: 0   losartan-hydrochlorothiazide (HYZAAR) 100-25 MG tablet, Take 1 tablet by mouth daily., Disp: 90 tablet, Rfl: 3   Multiple Vitamin (MULTIVITAMIN) tablet, Take 1 tablet by mouth daily., Disp: , Rfl:    Nintedanib (OFEV) 100 MG CAPS, TAKE 1 CAPSULE BY MOUTH TWICE DAILY WITH FOOD., Disp: 60 capsule, Rfl: 2   Oxycodone HCl 10 MG TABS, Take 10 mg by mouth as needed., Disp: , Rfl:    rosuvastatin (CRESTOR) 10 MG tablet, Take 1 tablet (10 mg total) by mouth daily., Disp: 90 tablet, Rfl: 3   SYNJARDY XR 12.07-998 MG TB24, Take 2 tablets by mouth daily., Disp: 180 tablet, Rfl: 3   VASCEPA 1 g capsule, Take 2 capsules (2 g total) by mouth 2 (two) times daily., Disp: 360 capsule, Rfl: 3  Social History   Tobacco Use  Smoking Status Former   Packs/day: 2.00   Years: 20.00   Total pack years: 40.00   Types: Cigarettes   Start date: 60   Quit date: 1994   Years since quitting: 29.7   Passive exposure: Past  Smokeless Tobacco Never    Allergies  Allergen Reactions   Integrilin [Eptifibatide] Other (See Comments)    Bleeding (non-specific)   Lopressor [Metoprolol Tartrate] Rash and Other (See  Comments)    Bleeding (non-specific)    Penicillins Diarrhea and Rash   Objective:  There were no vitals filed for this visit. There is no height or weight on file to calculate BMI. Constitutional Well developed. Well nourished.  Vascular Dorsalis pedis pulses palpable bilaterally. Posterior tibial pulses palpable bilaterally. Capillary refill normal to all digits.  No cyanosis or clubbing noted. Pedal hair growth normal.  Neurologic Normal speech. Oriented to person, place, and time. Epicritic sensation to light touch grossly present bilaterally.  Dermatologic Painful ingrowing nail at medial nail borders of the hallux nail right. No other open wounds. No skin lesions.  Orthopedic: Normal joint ROM without pain or crepitus bilaterally. No visible  deformities. No bony tenderness.   Radiographs: None Assessment:   1. Ingrown toenail of right foot   2. Type 2 diabetes with complication [facility])    Plan:  Patient was evaluated and treated and all questions answered.  Ingrown Nail, right -Patient elects to proceed with minor surgery to remove ingrown toenail removal today. Consent reviewed and signed by patient. -Ingrown nail excised. See procedure note. -Educated on post-procedure care including soaking. Written instructions provided and reviewed. -Patient to follow up in 2 weeks for nail check.  Procedure: Excision of Ingrown Toenail Location: Right 1st toe medial nail borders. Anesthesia: Lidocaine 1% plain; 1.5 mL and Marcaine 0.5% plain; 1.5 mL, digital block. Skin Prep: Betadine. Dressing: Silvadene; telfa; dry, sterile, compression dressing. Technique: Following skin prep, the toe was exsanguinated and a tourniquet was secured at the base of the toe. The affected nail border was freed, split with a nail splitter, and excised. Chemical matrixectomy was then performed with phenol and irrigated out with alcohol. The tourniquet was then removed and sterile dressing applied. Disposition: Patient tolerated procedure well. Patient to return in 2 weeks for follow-up.   No follow-ups on file.

## 2021-12-22 ENCOUNTER — Ambulatory Visit (INDEPENDENT_AMBULATORY_CARE_PROVIDER_SITE_OTHER): Payer: Medicare Other

## 2021-12-22 VITALS — Ht 71.0 in | Wt 200.0 lb

## 2021-12-22 DIAGNOSIS — Z1211 Encounter for screening for malignant neoplasm of colon: Secondary | ICD-10-CM | POA: Diagnosis not present

## 2021-12-22 DIAGNOSIS — Z0000 Encounter for general adult medical examination without abnormal findings: Secondary | ICD-10-CM

## 2021-12-22 NOTE — Patient Instructions (Signed)
Tyler Hendricks , Thank you for taking time to come for your Medicare Wellness Visit. I appreciate your ongoing commitment to your health goals. Please review the following plan we discussed and let me know if I can assist you in the future.   These are the goals we discussed:  Goals      DIET - INCREASE WATER INTAKE        This is a list of the screening recommended for you and due dates:  Health Maintenance  Topic Date Due   Pneumonia Vaccine (1 - PCV) Never done   Zoster (Shingles) Vaccine (2 of 2) 03/03/2017   COVID-19 Vaccine (5 - Pfizer risk series) 10/07/2020   Eye exam for diabetics  10/04/2021   Colon Cancer Screening  10/31/2021   Hemoglobin A1C  04/10/2022   Yearly kidney function blood test for diabetes  09/11/2022   Complete foot exam   09/20/2022   Yearly kidney health urinalysis for diabetes  10/09/2022   Tetanus Vaccine  09/06/2028   Flu Shot  Completed   Hepatitis C Screening: USPSTF Recommendation to screen - Ages 18-79 yo.  Completed   HPV Vaccine  Aged Out    Advanced directives: Please bring a copy of your health care power of attorney and living will to the office to be added to your chart at your convenience.   Conditions/risks identified: Aim for 30 minutes of exercise or brisk walking, 6-8 glasses of water, and 5 servings of fruits and vegetables each day.   Next appointment: Follow up in one year for your annual wellness visit.   Preventive Care 20 Years and Older, Male  Preventive care refers to lifestyle choices and visits with your health care provider that can promote health and wellness. What does preventive care include? A yearly physical exam. This is also called an annual well check. Dental exams once or twice a year. Routine eye exams. Ask your health care provider how often you should have your eyes checked. Personal lifestyle choices, including: Daily care of your teeth and gums. Regular physical activity. Eating a healthy diet. Avoiding  tobacco and drug use. Limiting alcohol use. Practicing safe sex. Taking low doses of aspirin every day. Taking vitamin and mineral supplements as recommended by your health care provider. What happens during an annual well check? The services and screenings done by your health care provider during your annual well check will depend on your age, overall health, lifestyle risk factors, and family history of disease. Counseling  Your health care provider may ask you questions about your: Alcohol use. Tobacco use. Drug use. Emotional well-being. Home and relationship well-being. Sexual activity. Eating habits. History of falls. Memory and ability to understand (cognition). Work and work Statistician. Screening  You may have the following tests or measurements: Height, weight, and BMI. Blood pressure. Lipid and cholesterol levels. These may be checked every 5 years, or more frequently if you are over 57 years old. Skin check. Lung cancer screening. You may have this screening every year starting at age 57 if you have a 30-pack-year history of smoking and currently smoke or have quit within the past 15 years. Fecal occult blood test (FOBT) of the stool. You may have this test every year starting at age 75. Flexible sigmoidoscopy or colonoscopy. You may have a sigmoidoscopy every 5 years or a colonoscopy every 10 years starting at age 55. Prostate cancer screening. Recommendations will vary depending on your family history and other risks. Hepatitis C blood test.  Hepatitis B blood test. Sexually transmitted disease (STD) testing. Diabetes screening. This is done by checking your blood sugar (glucose) after you have not eaten for a while (fasting). You may have this done every 1-3 years. Abdominal aortic aneurysm (AAA) screening. You may need this if you are a current or former smoker. Osteoporosis. You may be screened starting at age 36 if you are at high risk. Talk with your health care  provider about your test results, treatment options, and if necessary, the need for more tests. Vaccines  Your health care provider may recommend certain vaccines, such as: Influenza vaccine. This is recommended every year. Tetanus, diphtheria, and acellular pertussis (Tdap, Td) vaccine. You may need a Td booster every 10 years. Zoster vaccine. You may need this after age 73. Pneumococcal 13-valent conjugate (PCV13) vaccine. One dose is recommended after age 82. Pneumococcal polysaccharide (PPSV23) vaccine. One dose is recommended after age 61. Talk to your health care provider about which screenings and vaccines you need and how often you need them. This information is not intended to replace advice given to you by your health care provider. Make sure you discuss any questions you have with your health care provider. Document Released: 04/05/2015 Document Revised: 11/27/2015 Document Reviewed: 01/08/2015 Elsevier Interactive Patient Education  2017 [place] Prevention in the Home Falls can cause injuries. They can happen to people of all ages. There are many things you can do to make your home safe and to help prevent falls. What can I do on the outside of my home? Regularly fix the edges of walkways and driveways and fix any cracks. Remove anything that might make you trip as you walk through a door, such as a raised step or threshold. Trim any bushes or trees on the path to your home. Use bright outdoor lighting. Clear any walking paths of anything that might make someone trip, such as rocks or tools. Regularly check to see if handrails are loose or broken. Make sure that both sides of any steps have handrails. Any raised decks and porches should have guardrails on the edges. Have any leaves, snow, or ice cleared regularly. Use sand or salt on walking paths during winter. Clean up any spills in your garage right away. This includes oil or grease spills. What can I do in the  bathroom? Use night lights. Install grab bars by the toilet and in the tub and shower. Do not use towel bars as grab bars. Use non-skid mats or decals in the tub or shower. If you need to sit down in the shower, use a plastic, non-slip stool. Keep the floor dry. Clean up any water that spills on the floor as soon as it happens. Remove soap buildup in the tub or shower regularly. Attach bath mats securely with double-sided non-slip rug tape. Do not have throw rugs and other things on the floor that can make you trip. What can I do in the bedroom? Use night lights. Make sure that you have a light by your bed that is easy to reach. Do not use any sheets or blankets that are too big for your bed. They should not hang down onto the floor. Have a firm chair that has side arms. You can use this for support while you get dressed. Do not have throw rugs and other things on the floor that can make you trip. What can I do in the kitchen? Clean up any spills right away. Avoid walking on wet floors.  Keep items that you use a lot in easy-to-reach places. If you need to reach something above you, use a strong step stool that has a grab bar. Keep electrical cords out of the way. Do not use floor polish or wax that makes floors slippery. If you must use wax, use non-skid floor wax. Do not have throw rugs and other things on the floor that can make you trip. What can I do with my stairs? Do not leave any items on the stairs. Make sure that there are handrails on both sides of the stairs and use them. Fix handrails that are broken or loose. Make sure that handrails are as long as the stairways. Check any carpeting to make sure that it is firmly attached to the stairs. Fix any carpet that is loose or worn. Avoid having throw rugs at the top or bottom of the stairs. If you do have throw rugs, attach them to the floor with carpet tape. Make sure that you have a light switch at the top of the stairs and the  bottom of the stairs. If you do not have them, ask someone to add them for you. What else can I do to help prevent falls? Wear shoes that: Do not have high heels. Have rubber bottoms. Are comfortable and fit you well. Are closed at the toe. Do not wear sandals. If you use a stepladder: Make sure that it is fully opened. Do not climb a closed stepladder. Make sure that both sides of the stepladder are locked into place. Ask someone to hold it for you, if possible. Clearly mark and make sure that you can see: Any grab bars or handrails. First and last steps. Where the edge of each step is. Use tools that help you move around (mobility aids) if they are needed. These include: Canes. Walkers. Scooters. Crutches. Turn on the lights when you go into a dark area. Replace any light bulbs as soon as they burn out. Set up your furniture so you have a clear path. Avoid moving your furniture around. If any of your floors are uneven, fix them. If there are any pets around you, be aware of where they are. Review your medicines with your doctor. Some medicines can make you feel dizzy. This can increase your chance of falling. Ask your doctor what other things that you can do to help prevent falls. This information is not intended to replace advice given to you by your health care provider. Make sure you discuss any questions you have with your health care provider. Document Released: 01/03/2009 Document Revised: 08/15/2015 Document Reviewed: 04/13/2014 Elsevier Interactive Patient Education  2017 [employer].

## 2021-12-22 NOTE — Progress Notes (Signed)
Subjective:   Tyler Hendricks is a 71 y.o. male who presents for Medicare Annual/Subsequent preventive examination.   Virtual Visit via Telephone Note  I connected with  Georges Hendricks on 12/22/21 at  9:15 AM EDT by telephone and verified that I am speaking with the correct person using two identifiers.  Location: Patient: home  Provider: Esmond Plants  Persons participating in the virtual visit: Rattan   I discussed the limitations, risks, security and privacy concerns of performing an evaluation and management service by telephone and the availability of in person appointments. The patient expressed understanding and agreed to proceed.  Interactive audio and video telecommunications were attempted between this nurse and patient, however failed, due to patient having technical difficulties OR patient did not have access to video capability.  We continued and completed visit with audio only.  Some vital signs may be absent or patient reported.   Daphane Shepherd, LPN  Review of Systems     Cardiac Risk Factors include: advanced age (>20mn, >>83women);diabetes mellitus;dyslipidemia;hypertension;male gender     Objective:    Today's Vitals   12/22/21 0921  Weight: 200 lb (90.7 kg)  Height: '5\' 11"'$  (1.803 m)   Body mass index is 27.89 kg/m.     12/22/2021    9:26 AM 12/06/2020    2:26 PM  Advanced Directives  Does Patient Have a Medical Advance Directive? Yes No  Type of AParamedicof [place] will   Copy of [place] Chart? No - copy requested   Would patient like information on creating a medical advance directive?  No - Patient declined    Current Medications (verified) Outpatient Encounter Medications as of 12/22/2021  Medication Sig   amLODipine (NORVASC) 2.5 MG tablet Take 1 tablet (2.5 mg total) by mouth daily. TAKE 1 TABLET BY MOUTH EVERY DAY   atenolol (TENORMIN) 50 MG tablet Take 1 tablet (50  mg total) by mouth daily.   bictegravir-emtricitabine-tenofovir AF (BIKTARVY) 50-200-25 MG TABS tablet Take 1 tablet by mouth daily.   clopidogrel (PLAVIX) 75 MG tablet Take 1 tablet (75 mg total) by mouth daily.   HYDROcodone bit-homatropine (HYCODAN) 5-1.5 MG/5ML syrup Take 5 mLs by mouth every 6 (six) hours as needed for cough.   losartan-hydrochlorothiazide (HYZAAR) 100-25 MG tablet Take 1 tablet by mouth daily.   Multiple Vitamin (MULTIVITAMIN) tablet Take 1 tablet by mouth daily.   Nintedanib (OFEV) 100 MG CAPS TAKE 1 CAPSULE BY MOUTH TWICE DAILY WITH FOOD.   Oxycodone HCl 10 MG TABS Take 10 mg by mouth as needed.   rosuvastatin (CRESTOR) 10 MG tablet Take 1 tablet (10 mg total) by mouth daily.   SYNJARDY XR 12.07-998 MG TB24 Take 2 tablets by mouth daily.   VASCEPA 1 g capsule Take 2 capsules (2 g total) by mouth 2 (two) times daily.   No facility-administered encounter medications on file as of 12/22/2021.    Allergies (verified) Integrilin [eptifibatide], Lopressor [metoprolol tartrate], and Penicillins   History: Past Medical History:  Diagnosis Date   Abnormal result of cardiovascular function study, unspecified    Atherosclerotic heart disease of native coronary artery without angina pectoris    CAD (coronary artery disease)    Coronary angioplasty status    Diabetes mellitus without complication ([place]    Essential (primary) hypertension    Gallstones    Hepatic cirrhosis (HCC)    Hepatitis B core antibody positive 01/08/2019   Hepatitis C  HIV infection ([place])    Hyperlipidemia    Hypertension    Internal hemorrhoids    Kidney stones    Lung disease    Mixed hyperlipidemia    Palpitations    Sleep apnea    CPAP   Tubular adenoma of colon    Past Surgical History:  Procedure Laterality Date   CARDIAC CATHETERIZATION Left 04/2013   chlecystectomy     COLONOSCOPY  05/02/2013   PERCUTANEOUS CORONARY STENT INTERVENTION (PCI-S)     POLYPECTOMY     UMBILICAL  HERNIA REPAIR     Family History  Problem Relation Age of Onset   Emphysema Mother        pulmonary   Heart attack Father 31   Lung cancer Sister    Esophageal cancer Brother    Esophageal cancer Brother    Colon cancer Neg Hx    Inflammatory bowel disease Neg Hx    Liver disease Neg Hx    Pancreatic cancer Neg Hx    Rectal cancer Neg Hx    Stomach cancer Neg Hx    Colon polyps Neg Hx    Sleep apnea Neg Hx    Social History   Socioeconomic History   Marital status: Married    Spouse name: Not on file   Number of children: 1   Years of education: Not on file   Highest education level: 10th grade  Occupational History   Occupation: retired  Tobacco Use   Smoking status: Former    Packs/day: 2.00    Years: 20.00    Total pack years: 40.00    Types: Cigarettes    Start date: 30    Quit date: 1994    Years since quitting: 29.7    Passive exposure: Past   Smokeless tobacco: Never  Vaping Use   Vaping Use: Never used  Substance and Sexual Activity   Alcohol use: Yes    Comment: social-occ beer   Drug use: No   Sexual activity: Yes  Other Topics Concern   Not on file  Social History Narrative   Not on file   Social Determinants of Health   Financial Resource Strain: Low Risk  (12/22/2021)   Overall Financial Resource Strain (CARDIA)    Difficulty of Paying Living Expenses: Not hard at all  Food Insecurity: No Food Insecurity (12/22/2021)   Hunger Vital Sign    Worried About Running Out of Food in the Last Year: Never true    Ran Out of Food in the Last Year: Never true  Transportation Needs: No Transportation Needs (12/22/2021)   PRAPARE - Hydrologist (Medical): No    Lack of Transportation (Non-Medical): No  Physical Activity: Sufficiently Active (12/22/2021)   Exercise Vital Sign    Days of Exercise per Week: 5 days    Minutes of Exercise per Session: 30 min  Stress: No Stress Concern Present (12/22/2021)   [place]    Feeling of Stress : Not at all  Social Connections: Moderately Isolated (12/22/2021)   Social Connection and Isolation Panel [NHANES]    Frequency of Communication with Friends and Family: More than three times a week    Frequency of Social Gatherings with Friends and Family: More than three times a week    Attends Religious Services: Never    Marine scientist or Organizations: No    Attends Archivist Meetings: Never  Marital Status: Married    Tobacco Counseling Counseling given: Not Answered   Clinical Intake:  Pre-visit preparation completed: Yes  Pain : No/denies pain     Nutritional Risks: None Diabetes: No  How often do you need to have someone help you when you read instructions, pamphlets, or other written materials from your doctor or pharmacy?: 1 - Never  Diabetic?yes  Nutrition Risk Assessment:  Has the patient had any N/V/D within the last 2 months?  No  Does the patient have any non-healing wounds?  No  Has the patient had any unintentional weight loss or weight gain?  No   Diabetes:  Is the patient diabetic?  Yes  If diabetic, was a CBG obtained today?  No  Did the patient bring in their glucometer from home?  No  How often do you monitor your CBG's?  Never .   Financial Strains and Diabetes Management:  Are you having any financial strains with the device, your supplies or your medication? No .  Does the patient want to be seen by Chronic Care Management for management of their diabetes?  No  Would the patient like to be referred to a Nutritionist or for Diabetic Management?  No   Diabetic Exams:  Diabetic Eye Exam: Completed 10/2021 Diabetic Foot Exam: Overdue, Pt has been advised about the importance in completing this exam. Pt is scheduled for diabetic foot exam on next office visit .   Interpreter Needed?: No  Information entered by :: Jadene Pierini,  LPN   Activities of Daily Living    12/22/2021    9:26 AM  In your present state of health, do you have any difficulty performing the following activities:  Hearing? 0  Vision? 0  Difficulty concentrating or making decisions? 0  Walking or climbing stairs? 0  Dressing or bathing? 0  Doing errands, shopping? 0  Preparing Food and eating ? N  Using the Toilet? N  In the past six months, have you accidently leaked urine? N  Do you have problems with loss of bowel control? N  Managing your Medications? N  Managing your Finances? N  Housekeeping or managing your Housekeeping? N    Patient Care Team: Hoyt Koch, MD as PCP - General (Internal Medicine) Jettie Booze, MD as PCP - Cardiology (Cardiology) [facility] as Consulting Physician (Ophthalmology)  Indicate any recent Medical Services you may have received from other than [facility] providers in the past year (date may be approximate).     Assessment:   This is a routine wellness examination for Tyler Hendricks.  Hearing/Vision screen Vision Screening - Comments:: Annual eye exams wear glasses   Dietary issues and exercise activities discussed: Current Exercise Habits: Home exercise routine, Type of exercise: walking, Time (Minutes): 30, Frequency (Times/Week): 5, Weekly Exercise (Minutes/Week): 150, Intensity: Mild   Goals Addressed             This Visit's Progress    DIET - INCREASE WATER INTAKE         Depression Screen    12/22/2021    9:25 AM 09/19/2021   10:57 AM 07/18/2021    9:34 AM 12/27/2020    9:20 AM 12/06/2020    2:46 PM 05/24/2020   10:02 AM 12/08/2019   10:40 AM  PHQ 2/9 Scores  PHQ - 2 Score 0 0 0 0 0 0 0  PHQ- 9 Score  1         Fall Risk  12/22/2021    9:23 AM 09/19/2021   10:57 AM 07/18/2021    9:34 AM 12/27/2020    9:20 AM 12/06/2020    2:27 PM  Fall Risk   Falls in the past year? 0 0 0 0 0  Number falls in past yr: 0 0 0  0  Injury with Fall? 0 0 0  0  Risk  for fall due to : No Fall Risks    No Fall Risks  Follow up Falls prevention discussed    Falls evaluation completed    FALL RISK PREVENTION PERTAINING TO THE HOME:  Any stairs in or around the home? No  If so, are there any without handrails? No  Home free of loose throw rugs in walkways, pet beds, electrical cords, etc? Yes  Adequate lighting in your home to reduce risk of falls? Yes   ASSISTIVE DEVICES UTILIZED TO PREVENT FALLS:  Life alert? No  Use of a cane, walker or w/c? No  Grab bars in the bathroom? Yes  Shower chair or bench in shower? No  Elevated toilet seat or a handicapped toilet? No      12/22/2021    9:27 AM  6CIT Screen  What Year? 0 points  What month? 0 points  What time? 0 points  Count back from 20 0 points  Months in reverse 0 points  Repeat phrase 2 points  Total Score 2 points    Immunizations Immunization History  Administered Date(s) Administered   Fluad Quad(high Dose 65+) 11/17/2018, 12/08/2019, 12/19/2021   Influenza, High Dose Seasonal PF 12/20/2017, 11/17/2018, 12/08/2020   PFIZER(Purple Top)SARS-COV-2 Vaccination 05/18/2019, 06/14/2019, 12/22/2019, 08/12/2020   Tdap 09/07/2018   Zoster Recombinat (Shingrix) 01/06/2017    TDAP status: Up to date  Flu Vaccine status: Due, Education has been provided regarding the importance of this vaccine. Advised may receive this vaccine at local pharmacy or Health Dept. Aware to provide a copy of the vaccination record if obtained from local pharmacy or Health Dept. Verbalized acceptance and understanding.  Pneumococcal vaccine status: Due, Education has been provided regarding the importance of this vaccine. Advised may receive this vaccine at local pharmacy or Health Dept. Aware to provide a copy of the vaccination record if obtained from local pharmacy or Health Dept. Verbalized acceptance and understanding.  Covid-19 vaccine status: Completed vaccines  Qualifies for Shingles Vaccine? Yes    Zostavax completed No   Shingrix Completed?: No.    Education has been provided regarding the importance of this vaccine. Patient has been advised to call insurance company to determine out of pocket expense if they have not yet received this vaccine. Advised may also receive vaccine at local pharmacy or Health Dept. Verbalized acceptance and understanding.  Screening Tests Health Maintenance  Topic Date Due   Pneumonia Vaccine 80+ Years old (1 - PCV) Never done   Zoster Vaccines- Shingrix (2 of 2) 03/03/2017   COVID-19 Vaccine (5 - Pfizer risk series) 10/07/2020   OPHTHALMOLOGY EXAM  10/04/2021   COLONOSCOPY (Pts 45-18yr Insurance coverage will need to be confirmed)  10/31/2021   HEMOGLOBIN A1C  04/10/2022   Diabetic kidney evaluation - GFR measurement  09/11/2022   FOOT EXAM  09/20/2022   Diabetic kidney evaluation - Urine ACR  10/09/2022   TETANUS/TDAP  09/06/2028   INFLUENZA VACCINE  Completed   Hepatitis C Screening  Completed   HPV VACCINES  Aged Out    Health Maintenance  Health Maintenance Due  Topic Date Due  Pneumonia Vaccine 47+ Years old (1 - PCV) Never done   Zoster Vaccines- Shingrix (2 of 2) 03/03/2017   COVID-19 Vaccine (5 - Pfizer risk series) 10/07/2020   OPHTHALMOLOGY EXAM  10/04/2021   COLONOSCOPY (Pts 45-16yr Insurance coverage will need to be confirmed)  10/31/2021    Colorectal cancer screening: Referral to GI placed 12/22/2021. Pt aware the office will call re: appt.  Lung Cancer Screening: (Low Dose CT Chest recommended if Age 71-80years, 30 pack-year currently smoking OR have quit w/in 15years.) does not qualify.   Lung Cancer Screening Referral: n/a  Additional Screening:  Hepatitis C Screening: does not qualify;   Vision Screening: Recommended annual ophthalmology exams for early detection of glaucoma and other disorders of the eye. Is the patient up to date with their annual eye exam?  Yes  Who is the provider or what is the name of the  office in which the patient attends annual eye exams? Dr.Groat  If pt is not established with a provider, would they like to be referred to a provider to establish care? No .   Dental Screening: Recommended annual dental exams for proper oral hygiene  Community Resource Referral / Chronic Care Management: CRR required this visit?  No   CCM required this visit?  No      Plan:     I have personally reviewed and noted the following in the patient's chart:   Medical and social history Use of alcohol, tobacco or illicit drugs  Current medications and supplements including opioid prescriptions. Patient is currently taking opioid prescriptions. Information provided to patient regarding non-opioid alternatives. Patient advised to discuss non-opioid treatment plan with their provider. Functional ability and status Nutritional status Physical activity Advanced directives List of other physicians Hospitalizations, surgeries, and ER visits in previous 12 months Vitals Screenings to include cognitive, depression, and falls Referrals and appointments  In addition, I have reviewed and discussed with patient certain preventive protocols, quality metrics, and best practice recommendations. A written personalized care plan for preventive services as well as general preventive health recommendations were provided to patient.     LDaphane Shepherd LPN   [id number]  Nurse Notes: Due Pneumonia Vaccine

## 2021-12-23 LAB — T-HELPER CELLS (CD4) COUNT (NOT AT ARMC)
Absolute CD4: 615 cells/uL (ref 490–1740)
CD4 T Helper %: 54 % (ref 30–61)
Total lymphocyte count: 1149 cells/uL (ref 850–3900)

## 2021-12-23 LAB — HIV-1 RNA QUANT-NO REFLEX-BLD
HIV 1 RNA Quant: NOT DETECTED Copies/mL
HIV-1 RNA Quant, Log: NOT DETECTED Log cps/mL

## 2021-12-26 DIAGNOSIS — Z23 Encounter for immunization: Secondary | ICD-10-CM | POA: Diagnosis not present

## 2021-12-29 ENCOUNTER — Telehealth: Payer: Self-pay | Admitting: Pulmonary Disease

## 2021-12-29 DIAGNOSIS — R053 Chronic cough: Secondary | ICD-10-CM

## 2021-12-29 MED ORDER — HYDROCODONE BIT-HOMATROP MBR 5-1.5 MG/5ML PO SOLN: 5.0000 mL | Freq: Four times a day (QID) | ORAL | 0 refills | Status: DC | PRN

## 2021-12-29 NOTE — Telephone Encounter (Signed)
Refill has been sent.  °

## 2021-12-29 NOTE — Telephone Encounter (Signed)
Called patient and he states that he is wanting refill on his Hydodan cough syrup  Last refilled 12/19/2021 149m sent in Take 543mevery 6 hours as needed  Please advise sir

## 2021-12-29 NOTE — Telephone Encounter (Signed)
Called and spoke with pt letting him know this had been done and he verbalized understanding. Nothing further needed.

## 2021-12-30 ENCOUNTER — Encounter (HOSPITAL_COMMUNITY): Payer: Self-pay | Admitting: Ophthalmology

## 2021-12-30 ENCOUNTER — Other Ambulatory Visit: Payer: Self-pay

## 2021-12-30 ENCOUNTER — Ambulatory Visit (HOSPITAL_COMMUNITY): Payer: Medicare Other | Admitting: Certified Registered"

## 2021-12-30 ENCOUNTER — Encounter (HOSPITAL_COMMUNITY)
Admission: RE | Disposition: A | Discharge status: Home / Self Care | Payer: Self-pay | Source: Home / Self Care | Attending: Ophthalmology

## 2021-12-30 ENCOUNTER — Ambulatory Visit (HOSPITAL_BASED_OUTPATIENT_CLINIC_OR_DEPARTMENT_OTHER): Payer: Medicare Other | Admitting: Certified Registered"

## 2021-12-30 ENCOUNTER — Ambulatory Visit (HOSPITAL_COMMUNITY)
Admission: RE | Admit: 2021-12-30 | Discharge: 2021-12-30 | Disposition: A | Discharge status: Home / Self Care | Payer: Medicare Other | Attending: Ophthalmology | Admitting: Ophthalmology

## 2021-12-30 DIAGNOSIS — H53131 Sudden visual loss, right eye: Secondary | ICD-10-CM | POA: Diagnosis not present

## 2021-12-30 DIAGNOSIS — J45909 Unspecified asthma, uncomplicated: Secondary | ICD-10-CM | POA: Insufficient documentation

## 2021-12-30 DIAGNOSIS — H33011 Retinal detachment with single break, right eye: Secondary | ICD-10-CM | POA: Diagnosis not present

## 2021-12-30 DIAGNOSIS — H43391 Other vitreous opacities, right eye: Secondary | ICD-10-CM | POA: Diagnosis not present

## 2021-12-30 DIAGNOSIS — H3321 Serous retinal detachment, right eye: Secondary | ICD-10-CM | POA: Diagnosis not present

## 2021-12-30 DIAGNOSIS — Z955 Presence of coronary angioplasty implant and graft: Secondary | ICD-10-CM | POA: Diagnosis not present

## 2021-12-30 DIAGNOSIS — H25812 Combined forms of age-related cataract, left eye: Secondary | ICD-10-CM | POA: Diagnosis not present

## 2021-12-30 DIAGNOSIS — I2510 Atherosclerotic heart disease of native coronary artery without angina pectoris: Secondary | ICD-10-CM | POA: Insufficient documentation

## 2021-12-30 DIAGNOSIS — Z87891 Personal history of nicotine dependence: Secondary | ICD-10-CM

## 2021-12-30 DIAGNOSIS — M1990 Unspecified osteoarthritis, unspecified site: Secondary | ICD-10-CM | POA: Insufficient documentation

## 2021-12-30 DIAGNOSIS — I10 Essential (primary) hypertension: Secondary | ICD-10-CM | POA: Insufficient documentation

## 2021-12-30 DIAGNOSIS — Z961 Presence of intraocular lens: Secondary | ICD-10-CM | POA: Diagnosis not present

## 2021-12-30 DIAGNOSIS — E119 Type 2 diabetes mellitus without complications: Secondary | ICD-10-CM | POA: Insufficient documentation

## 2021-12-30 DIAGNOSIS — G4730 Sleep apnea, unspecified: Secondary | ICD-10-CM | POA: Diagnosis not present

## 2021-12-30 DIAGNOSIS — H53451 Other localized visual field defect, right eye: Secondary | ICD-10-CM | POA: Diagnosis not present

## 2021-12-30 DIAGNOSIS — Z8619 Personal history of other infectious and parasitic diseases: Secondary | ICD-10-CM | POA: Insufficient documentation

## 2021-12-30 DIAGNOSIS — H4311 Vitreous hemorrhage, right eye: Secondary | ICD-10-CM | POA: Diagnosis not present

## 2021-12-30 DIAGNOSIS — H33001 Unspecified retinal detachment with retinal break, right eye: Secondary | ICD-10-CM | POA: Diagnosis not present

## 2021-12-30 HISTORY — PX: PHOTOCOAGULATION WITH LASER: SHX6027

## 2021-12-30 HISTORY — PX: PARS PLANA VITRECTOMY: SHX2166

## 2021-12-30 HISTORY — PX: GAS/FLUID EXCHANGE: SHX5334

## 2021-12-30 LAB — COMPREHENSIVE METABOLIC PANEL
ALT: 36 U/L (ref 0–44)
AST: 42 U/L — ABNORMAL HIGH (ref 15–41)
Albumin: 3.6 g/dL (ref 3.5–5.0)
Alkaline Phosphatase: 94 U/L (ref 38–126)
Anion gap: 11 (ref 5–15)
BUN: 13 mg/dL (ref 8–23)
CO2: 24 mmol/L (ref 22–32)
Calcium: 9.2 mg/dL (ref 8.9–10.3)
Chloride: 104 mmol/L (ref 98–111)
Creatinine, Ser: 0.64 mg/dL (ref 0.61–1.24)
GFR, Estimated: >60 mL/min (ref >60)
Glucose, Bld: 105 mg/dL — ABNORMAL HIGH (ref 70–99)
Potassium: 3.5 mmol/L (ref 3.5–5.1)
Sodium: 139 mmol/L (ref 135–145)
Total Bilirubin: 4.3 mg/dL — ABNORMAL HIGH (ref 0.3–1.2)
Total Protein: 6.3 g/dL — ABNORMAL LOW (ref 6.5–8.1)

## 2021-12-30 LAB — CBC
HCT: 49 % (ref 39.0–52.0)
Hemoglobin: 17.1 g/dL — ABNORMAL HIGH (ref 13.0–17.0)
MCH: 34 pg (ref 26.0–34.0)
MCHC: 34.9 g/dL (ref 30.0–36.0)
MCV: 97.4 fL (ref 80.0–100.0)
Platelets: 132 10*3/uL — ABNORMAL LOW (ref 150–400)
RBC: 5.03 MIL/uL (ref 4.22–5.81)
RDW: 13.4 % (ref 11.5–15.5)
WBC: 6.4 10*3/uL (ref 4.0–10.5)
nRBC: 0 % (ref 0.0–0.2)

## 2021-12-30 LAB — GLUCOSE, CAPILLARY
Glucose-Capillary: 113 mg/dL — ABNORMAL HIGH (ref 70–99)
Glucose-Capillary: 84 mg/dL (ref 70–99)

## 2021-12-30 SURGERY — PARS PLANA VITRECTOMY WITH 25 GAUGE
Anesthesia: Monitor Anesthesia Care | Site: Eye | Laterality: Right

## 2021-12-30 MED ORDER — CHLORHEXIDINE GLUCONATE 0.12 % MT SOLN
15.0000 mL | Freq: Once | OROMUCOSAL | Status: AC
  Administered 2021-12-30: 15 mL via OROMUCOSAL
  Filled 2021-12-30: qty 15

## 2021-12-30 MED ORDER — LIDOCAINE HCL 2 % IJ SOLN
INTRAMUSCULAR | Status: AC
  Filled 2021-12-30: qty 20

## 2021-12-30 MED ORDER — BSS IO SOLN
INTRAOCULAR | Status: DC | PRN
  Administered 2021-12-30: 15 mL

## 2021-12-30 MED ORDER — VANCOMYCIN SUBCONJUNCTIVAL INJECTION 25 MG/0.5 ML
25.0000 mg | INTRAOCULAR | Status: AC
  Administered 2021-12-30: 25 mg via SUBCONJUNCTIVAL
  Filled 2021-12-30: qty 0.5

## 2021-12-30 MED ORDER — BSS IO SOLN
INTRAOCULAR | Status: AC
  Filled 2021-12-30: qty 15

## 2021-12-30 MED ORDER — BSS PLUS IO SOLN
INTRAOCULAR | Status: AC
  Filled 2021-12-30: qty 500

## 2021-12-30 MED ORDER — BUPIVACAINE HCL (PF) 0.75 % IJ SOLN
INTRAMUSCULAR | Status: AC
  Filled 2021-12-30: qty 10

## 2021-12-30 MED ORDER — ATROPINE SULFATE 1 % OP SOLN
OPHTHALMIC | Status: AC
  Filled 2021-12-30: qty 5

## 2021-12-30 MED ORDER — ACETAMINOPHEN 500 MG PO TABS: 1000.0000 mg | ORAL_TABLET | Freq: Once | ORAL | Status: AC

## 2021-12-30 MED ORDER — HYALURONIDASE HUMAN 150 UNIT/ML IJ SOLN
INTRAMUSCULAR | Status: AC
  Filled 2021-12-30: qty 1

## 2021-12-30 MED ORDER — DEXAMETHASONE SODIUM PHOSPHATE 10 MG/ML IJ SOLN
INTRAMUSCULAR | Status: AC
  Filled 2021-12-30: qty 1

## 2021-12-30 MED ORDER — DEXAMETHASONE SODIUM PHOSPHATE 10 MG/ML IJ SOLN
INTRAMUSCULAR | Status: DC | PRN
  Administered 2021-12-30: 10 mg

## 2021-12-30 MED ORDER — PROPOFOL 10 MG/ML IV BOLUS
INTRAVENOUS | Status: AC
  Filled 2021-12-30: qty 20

## 2021-12-30 MED ORDER — OFLOXACIN 0.3 % OP SOLN
1.0000 [drp] | OPHTHALMIC | Status: AC | PRN
  Administered 2021-12-30 (×3): 1 [drp] via OPHTHALMIC
  Filled 2021-12-30: qty 5

## 2021-12-30 MED ORDER — SODIUM CHLORIDE 0.9 % IV SOLN: INTRAVENOUS | Status: DC

## 2021-12-30 MED ORDER — ACETAMINOPHEN 500 MG PO TABS
ORAL_TABLET | ORAL | Status: AC
  Administered 2021-12-30: 1000 mg via ORAL
  Filled 2021-12-30: qty 2

## 2021-12-30 MED ORDER — CYCLOPENTOLATE HCL 1 % OP SOLN
1.0000 [drp] | OPHTHALMIC | Status: AC | PRN
  Administered 2021-12-30 (×3): 1 [drp] via OPHTHALMIC
  Filled 2021-12-30: qty 2

## 2021-12-30 MED ORDER — PHENYLEPHRINE HCL 2.5 % OP SOLN
1.0000 [drp] | OPHTHALMIC | Status: AC | PRN
  Administered 2021-12-30 (×3): 1 [drp] via OPHTHALMIC
  Filled 2021-12-30: qty 2

## 2021-12-30 MED ORDER — LIDOCAINE HCL 2 % IJ SOLN
INTRAMUSCULAR | Status: DC | PRN
  Administered 2021-12-30: 5 mL via RETROBULBAR

## 2021-12-30 MED ORDER — NA CHONDROIT SULF-NA HYALURON 40-30 MG/ML IO SOSY
INTRAOCULAR | Status: DC | PRN
  Administered 2021-12-30: 0.5 mL via INTRAOCULAR

## 2021-12-30 MED ORDER — ORAL CARE MOUTH RINSE: 15.0000 mL | Freq: Once | OROMUCOSAL | Status: AC

## 2021-12-30 MED ORDER — TOBRAMYCIN 0.3 % OP OINT
TOPICAL_OINTMENT | OPHTHALMIC | Status: DC | PRN
  Administered 2021-12-30: 1 via OPHTHALMIC

## 2021-12-30 MED ORDER — EPINEPHRINE PF 1 MG/ML IJ SOLN
INTRAOCULAR | Status: DC | PRN
  Administered 2021-12-30: 500 mL

## 2021-12-30 MED ORDER — TOBRAMYCIN-DEXAMETHASONE 0.3-0.1 % OP OINT
TOPICAL_OINTMENT | OPHTHALMIC | Status: AC
  Filled 2021-12-30: qty 3.5

## 2021-12-30 MED ORDER — EPINEPHRINE PF 1 MG/ML IJ SOLN
INTRAMUSCULAR | Status: AC
  Filled 2021-12-30: qty 1

## 2021-12-30 MED ORDER — PROPARACAINE HCL 0.5 % OP SOLN
1.0000 [drp] | OPHTHALMIC | Status: AC | PRN
  Administered 2021-12-30 (×3): 1 [drp] via OPHTHALMIC
  Filled 2021-12-30: qty 15

## 2021-12-30 MED ORDER — PROPOFOL 10 MG/ML IV BOLUS
INTRAVENOUS | Status: DC | PRN
  Administered 2021-12-30: 30 mg via INTRAVENOUS

## 2021-12-30 SURGICAL SUPPLY — 56 items
APL SWBSTK 6 STRL LF DISP (MISCELLANEOUS) ×2
APPLICATOR COTTON TIP 6 STRL (MISCELLANEOUS) ×2 IMPLANT
APPLICATOR COTTON TIP 6IN STRL (MISCELLANEOUS) ×2 IMPLANT
BAND WRIST GAS GREEN (MISCELLANEOUS) ×1 IMPLANT
CABLE BIPOLOR RESECTION CORD (MISCELLANEOUS) ×1 IMPLANT
CANNULA VLV SOFT TIP 25G (OPHTHALMIC) ×1 IMPLANT
CANNULA VLV SOFT TIP 25GA (OPHTHALMIC) ×2 IMPLANT
CAUTERY EYE LOW TEMP 1300F FIN (OPHTHALMIC RELATED) IMPLANT
CLSR STERI-STRIP ANTIMIC 1/2X4 (GAUZE/BANDAGES/DRESSINGS) ×2 IMPLANT
COVER MAYO STAND STRL (DRAPES) IMPLANT
DRAPE HALF SHEET 40X57 (DRAPES) ×2 IMPLANT
DRAPE INCISE 51X51 W/FILM STRL (DRAPES) IMPLANT
DRAPE RETRACTOR (MISCELLANEOUS) ×2 IMPLANT
GAS AUTO FILL CONSTEL (OPHTHALMIC) ×2
GAS AUTO FILL CONSTELLATION (OPHTHALMIC) ×1 IMPLANT
GAS WRIST BAND GREEN (MISCELLANEOUS) ×2
GLOVE SURG SYN 7.5  E (GLOVE) ×2
GLOVE SURG SYN 7.5 E (GLOVE) ×2 IMPLANT
GLOVE SURG SYN 7.5 PF PI (GLOVE) ×1 IMPLANT
GOWN STRL REUS W/ TWL LRG LVL3 (GOWN DISPOSABLE) ×2 IMPLANT
GOWN STRL REUS W/TWL LRG LVL3 (GOWN DISPOSABLE) ×2
KIT BASIN OR (CUSTOM PROCEDURE TRAY) ×2 IMPLANT
KIT TURNOVER KIT B (KITS) ×2 IMPLANT
LENS BIOM SUPER VIEW SET DISP (MISCELLANEOUS) ×2 IMPLANT
NDL 18GX1X1/2 (RX/OR ONLY) (NEEDLE) ×1 IMPLANT
NDL 25GX 5/8IN NON SAFETY (NEEDLE) ×1 IMPLANT
NDL 27GX1/2 REG BEVEL ECLIP (NEEDLE) ×1 IMPLANT
NDL FILTER BLUNT 18X1 1/2 (NEEDLE) ×1 IMPLANT
NDL HYPO 25GX1X1/2 BEV (NEEDLE) IMPLANT
NDL HYPO 30X.5 LL (NEEDLE) ×2 IMPLANT
NDL RETROBULBAR 25GX1.5 (NEEDLE) ×1 IMPLANT
NEEDLE 18GX1X1/2 (RX/OR ONLY) (NEEDLE) ×2 IMPLANT
NEEDLE 25GX 5/8IN NON SAFETY (NEEDLE) ×2 IMPLANT
NEEDLE 27GX1/2 REG BEVEL ECLIP (NEEDLE) ×2 IMPLANT
NEEDLE FILTER BLUNT 18X1 1/2 (NEEDLE) ×2 IMPLANT
NEEDLE HYPO 25GX1X1/2 BEV (NEEDLE) IMPLANT
NEEDLE HYPO 30X.5 LL (NEEDLE) ×4 IMPLANT
NEEDLE RETROBULBAR 25GX1.5 (NEEDLE) ×2 IMPLANT
NS IRRIG 1000ML POUR BTL (IV SOLUTION) ×2 IMPLANT
PACK VITRECTOMY CUSTOM (CUSTOM PROCEDURE TRAY) ×2 IMPLANT
PAD ARMBOARD 7.5X6 YLW CONV (MISCELLANEOUS) ×4 IMPLANT
PAK PIK VITRECTOMY CVS 25GA (OPHTHALMIC) ×2 IMPLANT
PROBE ENDO DIATHERMY 25G (MISCELLANEOUS) ×2 IMPLANT
PROBE LASER ILLUM FLEX CVD 25G (OPHTHALMIC) ×1 IMPLANT
ROLLS DENTAL (MISCELLANEOUS) IMPLANT
SOL ANTI FOG 6CC (MISCELLANEOUS) ×2 IMPLANT
SOLUTION ANTI FOG 6CC (MISCELLANEOUS) ×2
STOPCOCK 4 WAY LG BORE MALE ST (IV SETS) ×1 IMPLANT
SUT VICRYL 7 0 TG140 8 (SUTURE) ×1 IMPLANT
SUT VICRYL 8 0 TG140 8 (SUTURE) IMPLANT
SYR 10ML LL (SYRINGE) IMPLANT
SYR 20ML LL LF (SYRINGE) ×2 IMPLANT
SYR 5ML LL (SYRINGE) ×2 IMPLANT
SYR TB 1ML LUER SLIP (SYRINGE) ×1 IMPLANT
WATER STERILE IRR 1000ML POUR (IV SOLUTION) ×2 IMPLANT
WIPE INSTRUMENT VISIWIPE 73X73 (MISCELLANEOUS) IMPLANT

## 2021-12-30 NOTE — Op Note (Signed)
[name] 12/30/2021 Diagnosis: Retinal detachment with single break right eye  Procedure: Retinal detachment repair right eye with [place] Vitrectomy, Endolaser, Fluid Gas Exchange, and endocautery, retinotomy, and drainage of subretinal fluid Operative Eye:  right eye  Surgeon: Royston Cowper Estimated Blood Loss: minimal Specimens for Pathology:  None Complications: none   The  patient was prepped and draped in the usual fashion for ocular surgery on the  right eye .  A lid speculum was placed.  Infusion line and trocar was placed at the 8 o'clock position approximately 3.5 mm from the surgical limbus.   The infusion line was allowed to run and then clamped when placed at the cannula opening. The line was inserted and secured to the drape with an adhesive strip.   Active trocars/cannula were placed at the 10 and 2 o'clock positions approximately 3.5 mm from the surgical limbus. The cannula was visualized in the vitreous cavity.  The light pipe and vitreous cutter were inserted into the vitreous cavity and a core vitrectomy was performed.  Care taken to remove the vitreous up to the vitreous base for 360 degrees. The retina was carefully shaved over the area of detachment and the retinal tear.  After careful shaving of the peripheral retina, attention was directed to drainage of subretinal fluid.  Endocautery was used to prepare a site for retinotomy.  The silicone soft-tipped canula was used to drain subretinal fluid at the endocautery site creating a retinotomy.  A complete air/fluid exchange was performed.  Subretinal fluid was drained and the retina flattened.  3 rows of endolaser were applied 360 degrees to the periphery and surrounding the break and retinotomy.  15% SF6 gas was placed in the eye.  The superior cannulas were sequentially removed with concommitant tamponade using a cotton tipped applicator and noted to be air tight.  The infusion line and trocar were removed  and the sclerotomy was noted to be air tight with normal intraocular pressure by digital palpapation.  Subconjunctival injections of  Vancomycin and Dexamethasone '4mg'$ /37m were placed in the infero-medial quadrant.   The speculum and drapes were removed and the eye was patched with Polymixin/Bacitracin ophthalmic ointment. An eye shield was placed and the patient was transferred alert and conversant with stable vital signs to the post operative recovery area.  The patient tolerated the procedure well and no complications were noted.  NRoyston CowperMD

## 2021-12-30 NOTE — Discharge Instructions (Addendum)
DO NOT SLEEP ON BACK, THE EYE PRESSURE CAN GO UP AND CAUSE VISION LOSS   SLEEP ON SIDE WITH NOSE TO PILLOW  DURING DAY KEEP FACE DOWN.  15 MINUTES EVERY 2 HOURS IT IS OK TO LOOK STRAIGHT AHEAD (USE BATHROOM, EAT, WALK, ETC.)  

## 2021-12-30 NOTE — Brief Op Note (Signed)
12/30/2021  4:35 PM  PATIENT:  Tyler Hendricks  71 y.o. male  PRE-OPERATIVE DIAGNOSIS:  Right Retinal Detachment with single break  POST-OPERATIVE DIAGNOSIS:  Right Retinal Detachment with single break  PROCEDURE:  Procedure(s) with comments: RETINAL DETACHMENT REPAIR  (right) PARS PLANA VITRECTOMY WITH 25 GAUGE (Right) ENDOCAUTERY/RETINOTOMY (Right) DRAINAGE OF SUBRETINAL FLUID (Right) PHOTOCOAGULATION WITH LASER (Right) GAS/FLUID EXCHANGE (Right) - SF6 GAS TAMPONADE  SURGEON:  Surgeon(s) and Role:    * Jalene Mullet, MD - Primary  PHYSICIAN ASSISTANT:   ASSISTANTS: none   ANESTHESIA:   local and MAC  EBL:  MINIMAL   BLOOD ADMINISTERED:none  DRAINS: none   LOCAL MEDICATIONS USED:  MARCAINE    and LIDOCAINE   SPECIMEN:  No Specimen  DISPOSITION OF SPECIMEN:  N/A  COUNTS:  YES  TOURNIQUET:  * No tourniquets in log *  DICTATION: .Note written in EPIC  PLAN OF CARE: Discharge to home after PACU  PATIENT DISPOSITION:  PACU - hemodynamically stable.   Delay start of Pharmacological VTE agent (>24hrs) due to surgical blood loss or risk of bleeding: not applicable

## 2021-12-30 NOTE — Anesthesia Postprocedure Evaluation (Signed)
Anesthesia Post Note  Patient: Tyler Hendricks  Procedure(s) Performed: PARS PLANA VITRECTOMY WITH 25 GAUGE (Right: Eye) PHOTOCOAGULATION WITH LASER (Right: Eye) GAS/FLUID EXCHANGE (Right: Eye)     Patient location during evaluation: PACU Anesthesia Type: MAC Level of consciousness: awake and alert Pain management: pain level controlled Vital Signs Assessment: post-procedure vital signs reviewed and stable Respiratory status: spontaneous breathing, nonlabored ventilation and respiratory function stable Cardiovascular status: stable and blood pressure returned to baseline Postop Assessment: no apparent nausea or vomiting Anesthetic complications: no   No notable events documented.  Last Vitals:  Vitals:   12/30/21 1643 12/30/21 1658  BP: 135/70 125/75  Pulse: (!) 58 61  Resp: 19 13  Temp: 37.3 C   SpO2: 92% 93%    Last Pain:  Vitals:   12/30/21 1658  TempSrc:   PainSc: 0-No pain                 Jamarria Real,W. EDMOND

## 2021-12-30 NOTE — OR Nursing (Signed)
Green Gas Bubble Bracelet applied to right wrist

## 2021-12-30 NOTE — H&P (Signed)
Date of examination:  12/30/2021  Indication for surgery: retinal detachment right eye  Pertinent past medical history:  Past Medical History:  Diagnosis Date   Abnormal result of cardiovascular function study, unspecified    Atherosclerotic heart disease of native coronary artery without angina pectoris    CAD (coronary artery disease)    Coronary angioplasty status    Diabetes mellitus without complication ([place])    Essential (primary) hypertension    Gallstones    Hepatic cirrhosis (HCC)    Hepatitis B core antibody positive 01/08/2019   Hepatitis C    HIV infection ([place])    Hyperlipidemia    Hypertension    Internal hemorrhoids    Kidney stones    Lung disease    Mixed hyperlipidemia    Palpitations    Sleep apnea    CPAP   Tubular adenoma of colon     Pertinent ocular history:  floaters  Pertinent family history:  Family History  Problem Relation Age of Onset   Emphysema Mother        pulmonary   Heart attack Father 76   Lung cancer Sister    Esophageal cancer Brother    Esophageal cancer Brother    Colon cancer Neg Hx    Inflammatory bowel disease Neg Hx    Liver disease Neg Hx    Pancreatic cancer Neg Hx    Rectal cancer Neg Hx    Stomach cancer Neg Hx    Colon polyps Neg Hx    Sleep apnea Neg Hx     General:  Healthy appearing patient in no distress.    Eyes:    Acuity OD 20/80   External: Within normal limits      Anterior segment: Within normal limits        Fundus: superotemporal retinal detachment right eye    Impression: Retinal detachment right eye  Plan:  Retinal detachment repair right eye  Jalene Mullet, MD

## 2021-12-30 NOTE — Transfer of Care (Signed)
Immediate Anesthesia Transfer of Care Note  Patient: Tyler Hendricks  Procedure(s) Performed: PARS PLANA VITRECTOMY WITH 25 GAUGE (Right: Eye) PHOTOCOAGULATION WITH LASER (Right: Eye) GAS/FLUID EXCHANGE (Right: Eye)  Patient Location: PACU  Anesthesia Type:MAC  Level of Consciousness: awake, alert  and oriented  Airway & Oxygen Therapy: Patient Spontanous Breathing  Post-op Assessment: Report given to RN  Post vital signs: Reviewed and stable  Last Vitals:  Vitals Value Taken Time  BP 135/70 12/30/21 1643  Temp 37.3 C 12/30/21 1643  Pulse 64 12/30/21 1644  Resp 15 12/30/21 1644  SpO2 94 % 12/30/21 1644  Vitals shown include unvalidated device data.  Last Pain:  Vitals:   12/30/21 1344  TempSrc:   PainSc: 0-No pain         Complications: No notable events documented.

## 2021-12-30 NOTE — Anesthesia Procedure Notes (Signed)
Procedure Name: MAC Date/Time: 12/30/2021 3:32 PM  Performed by: Barrington Ellison, CRNAPre-anesthesia Checklist: Patient identified, Emergency Drugs available, Suction available, Patient being monitored and Timeout performed Patient Re-evaluated:Patient Re-evaluated prior to induction Oxygen Delivery Method: Nasal cannula

## 2021-12-30 NOTE — Anesthesia Preprocedure Evaluation (Addendum)
Anesthesia Evaluation  Patient identified by MRN, date of birth, ID band Patient awake    Reviewed: Allergy & Precautions, H&P , NPO status , Patient's Chart, lab work & pertinent test results, reviewed documented beta blocker date and time   Airway Mallampati: II  TM Distance: >3 FB Neck ROM: Full    Dental no notable dental hx. (+) Edentulous Upper, Edentulous Lower, Dental Advisory Given   Pulmonary asthma , sleep apnea and Continuous Positive Airway Pressure Ventilation , former smoker,    Pulmonary exam normal breath sounds clear to auscultation       Cardiovascular hypertension, Pt. on medications and Pt. on home beta blockers + CAD and + Cardiac Stents   Rhythm:Regular Rate:Normal     Neuro/Psych negative neurological ROS  negative psych ROS   GI/Hepatic negative GI ROS, (+) Cirrhosis       , Hepatitis -, C  Endo/Other  diabetes  Renal/GU negative Renal ROS  negative genitourinary   Musculoskeletal  (+) Arthritis , Osteoarthritis,    Abdominal   Peds  Hematology  (+) HIV,   Anesthesia Other Findings   Reproductive/Obstetrics negative OB ROS                         Anesthesia Physical Anesthesia Plan  ASA: 3  Anesthesia Plan: MAC   Post-op Pain Management: Tylenol PO (pre-op)*   Induction: Intravenous  PONV Risk Score and Plan: 2 and Propofol infusion and Ondansetron  Airway Management Planned: Natural Airway and Simple Face Mask  Additional Equipment:   Intra-op Plan:   Post-operative Plan:   Informed Consent: I have reviewed the patients History and Physical, chart, labs and discussed the procedure including the risks, benefits and alternatives for the proposed anesthesia with the patient or authorized representative who has indicated his/her understanding and acceptance.     Dental advisory given  Plan Discussed with: CRNA  Anesthesia Plan Comments:          Anesthesia Quick Evaluation

## 2021-12-31 ENCOUNTER — Encounter (HOSPITAL_COMMUNITY): Payer: Self-pay | Admitting: Ophthalmology

## 2022-01-01 NOTE — Progress Notes (Signed)
Brief Narrative   Patient ID: Tyler Hendricks, male    DOB: [birth year], 71 y.o.   MRN: [id number]  Mr. Rog is a 71 y/o caucasian male diagnosed with HIV disease in 1989 with risk factor being IV drug use. Initial viral load is unknown with CD4 nadir of 510. No history of opportunistic infection. [id number] negative. Previous ART history with Zerit/Stauvadine-Epivir, Atripla, Complera and Odefsey.   Subjective:    Chief Complaint  Patient presents with   Follow-up    HPI:  Tyler Hendricks is a 71 y.o. male with HIV disease last seen on 07/18/2021 with well-controlled virus and good adherence and tolerance to Biktarvy.Viral load was undetectable with CD4 count of 655.  Most recent lab work completed on 12/19/2021 with viral load that remains undetectable and CD4 count of 615.  Kidney function, liver function, electrolytes within normal ranges. Here today for follow up.   Tyler Hendricks has been doing okay since his last office visit and was recently admitted to the hospital for emergency surgery for retinal detachment from which he continues to recover. Continues to take Biktarvy daily as prescribed with no adverse side effects or problems obtaining medication from the pharmacy. Has questions about receiving RSV vaccination. Influenza vaccination up to date and due for Prevnar 63.   Denies fevers, chills, night sweats, headaches, changes in vision, neck pain/stiffness, nausea, diarrhea, vomiting, lesions or rashes.    Allergies  Allergen Reactions   Integrilin [Eptifibatide] Other (See Comments)    Bleeding (non-specific)   Lopressor [Metoprolol Tartrate] Rash and Other (See Comments)    Bleeding (non-specific)    Penicillins Diarrhea and Rash      Outpatient Medications Prior to Visit  Medication Sig Dispense Refill   amLODipine (NORVASC) 2.5 MG tablet Take 1 tablet (2.5 mg total) by mouth daily. TAKE 1 TABLET BY MOUTH EVERY DAY (Patient taking differently: Take 2.5 mg by mouth daily.) 90  tablet 3   atenolol (TENORMIN) 50 MG tablet Take 1 tablet (50 mg total) by mouth daily. 90 tablet 3   clopidogrel (PLAVIX) 75 MG tablet Take 1 tablet (75 mg total) by mouth daily. 90 tablet 3   HYDROcodone bit-homatropine (HYCODAN) 5-1.5 MG/5ML syrup Take 5 mLs by mouth every 6 (six) hours as needed for cough. 120 mL 0   losartan-hydrochlorothiazide (HYZAAR) 100-25 MG tablet Take 1 tablet by mouth daily. 90 tablet 3   Multiple Vitamin (MULTIVITAMIN) tablet Take 1 tablet by mouth daily.     Nintedanib (OFEV) 100 MG CAPS TAKE 1 CAPSULE BY MOUTH TWICE DAILY WITH FOOD. (Patient taking differently: Take 100 mg by mouth 2 (two) times daily with a meal.) 60 capsule 2   Oxycodone HCl 10 MG TABS Take 10 mg by mouth as needed (Pain).     rosuvastatin (CRESTOR) 10 MG tablet Take 1 tablet (10 mg total) by mouth daily. 90 tablet 3   SYNJARDY XR 12.07-998 MG TB24 Take 2 tablets by mouth daily. 180 tablet 3   VASCEPA 1 g capsule Take 2 capsules (2 g total) by mouth 2 (two) times daily. 360 capsule 3   bictegravir-emtricitabine-tenofovir AF (BIKTARVY) 50-200-25 MG TABS tablet Take 1 tablet by mouth daily. 30 tablet 11   ofloxacin (OCUFLOX) 0.3 % ophthalmic solution Place 1 drop into the right eye 4 (four) times daily.     prednisoLONE acetate (PRED FORTE) 1 % ophthalmic suspension Place into the right eye.     No facility-administered medications prior to visit.     Past  Medical History:  Diagnosis Date   Abnormal result of cardiovascular function study, unspecified    Atherosclerotic heart disease of native coronary artery without angina pectoris    CAD (coronary artery disease)    Coronary angioplasty status    Diabetes mellitus without complication ([place])    Essential (primary) hypertension    Gallstones    Hepatic cirrhosis (HCC)    Hepatitis B core antibody positive 01/08/2019   Hepatitis C    HIV infection ([place])    Hyperlipidemia    Hypertension    Internal hemorrhoids    Kidney stones     Lung disease    Mixed hyperlipidemia    Palpitations    Sleep apnea    CPAP   Tubular adenoma of colon      Past Surgical History:  Procedure Laterality Date   CARDIAC CATHETERIZATION Left 04/2013   chlecystectomy     CHOLECYSTECTOMY     COLONOSCOPY  05/02/2013   GAS/FLUID EXCHANGE Right 12/30/2021   Procedure: GAS/FLUID EXCHANGE;  Surgeon: Jalene Mullet, MD;  Location: [facility];  Service: Ophthalmology;  Laterality: Right;  SF6   PARS PLANA VITRECTOMY Right 12/30/2021   Procedure: PARS PLANA VITRECTOMY WITH 25 GAUGE;  Surgeon: Jalene Mullet, MD;  Location: [facility];  Service: Ophthalmology;  Laterality: Right;   PERCUTANEOUS CORONARY STENT INTERVENTION (PCI-S)     PHOTOCOAGULATION WITH LASER Right 12/30/2021   Procedure: PHOTOCOAGULATION WITH LASER;  Surgeon: Jalene Mullet, MD;  Location: [facility];  Service: Ophthalmology;  Laterality: Right;   POLYPECTOMY     UMBILICAL HERNIA REPAIR        Review of Systems  Constitutional:  Negative for appetite change, chills, fatigue, fever and unexpected weight change.  Eyes:  Negative for visual disturbance.  Respiratory:  Negative for cough, chest tightness, shortness of breath and wheezing.   Cardiovascular:  Negative for chest pain and leg swelling.  Gastrointestinal:  Negative for abdominal pain, constipation, diarrhea, nausea and vomiting.  Genitourinary:  Negative for dysuria, flank pain, frequency, genital sores, hematuria and urgency.  Skin:  Negative for rash.  Allergic/Immunologic: Negative for immunocompromised state.  Neurological:  Negative for dizziness and headaches.      Objective:    BP 137/88   Pulse 61   Resp 16   Ht '5\' 11"'$  (1.803 m)   Wt 191 lb 6.4 oz (86.8 kg)   SpO2 92%   BMI 26.69 kg/m  Nursing note and vital signs reviewed.  Physical Exam Constitutional:      General: He is not in acute distress.    Appearance: He is well-developed.  Eyes:     Conjunctiva/sclera: Conjunctivae normal.   Cardiovascular:     Rate and Rhythm: Normal rate and regular rhythm.     Heart sounds: Normal heart sounds. No murmur heard.    No friction rub. No gallop.  Pulmonary:     Effort: Pulmonary effort is normal. No respiratory distress.     Breath sounds: Normal breath sounds. No wheezing or rales.  Chest:     Chest wall: No tenderness.  Abdominal:     General: Bowel sounds are normal.     Palpations: Abdomen is soft.     Tenderness: There is no abdominal tenderness.  Musculoskeletal:     Cervical back: Neck supple.  Lymphadenopathy:     Cervical: No cervical adenopathy.  Skin:    General: Skin is warm and dry.     Findings: No rash.  Neurological:     Mental  Status: He is alert and oriented to person, place, and time.  Psychiatric:        Behavior: Behavior normal.        Thought Content: Thought content normal.        Judgment: Judgment normal.         01/02/2022    9:39 AM 12/22/2021    9:25 AM 09/19/2021   10:57 AM 07/18/2021    9:34 AM 12/27/2020    9:20 AM  Depression screen PHQ 2/9  Decreased Interest 0 0 0 0 0  Down, Depressed, Hopeless 0 0 0 0 0  PHQ - 2 Score 0 0 0 0 0  Altered sleeping   1    Tired, decreased energy   0    Change in appetite   0    Feeling bad or failure about yourself    0    Trouble concentrating   0    Moving slowly or fidgety/restless   0    Suicidal thoughts   0    PHQ-9 Score   1    Difficult doing work/chores   Not difficult at all         Assessment & Plan:    Patient Active Problem List   Diagnosis Date Noted   Neural foraminal stenosis of cervical spine 01/03/2020   Chronic chest wall pain 11/16/2019   Routine general medical examination at a health care facility 11/16/2019   Hyperlipidemia LDL goal <70 11/15/2019   Benign prostatic hyperplasia without lower urinary tract symptoms 11/15/2019   Interstitial pulmonary disease ([place]) 07/18/2019   Chronic bilateral low back pain without sciatica 03/02/2019   Muscle cramps  03/02/2019   Cirrhosis of liver without ascites ([place]) 01/08/2019   Secondary esophageal varices without bleeding ([place]) 01/08/2019   Intestinal metaplasia of gastric mucosa 01/08/2019   Chronic cough 06/24/2018   Healthcare maintenance 05/11/2018   Hx of colonic polyp 04/27/2018   Change in bowel habit 04/27/2018   Ventral hernia without obstruction or gangrene 04/27/2018   Thrombocytopenia ([place]) 04/27/2018   Hypersplenism 04/27/2018   Coronary artery disease without angina pectoris 02/25/2018   Obstructive sleep apnea 10/25/2017   Extrinsic asthma without complication 16/12/9602   Vitamin D deficiency 08/25/2017   HIV disease ([place]) 04/15/2017   Primary osteoarthritis of both knees 04/15/2017   Type 2 diabetes with complication ([place]) 54/11/8117   Essential hypertension 04/15/2017     Problem List Items Addressed This Visit       Other   HIV disease ([place]) - Primary    Rose continues to have well-controlled virus with good adherence and tolerance to [employer].  Reviewed lab work and discussed plan of care.  Continue current dose of Biktarvy.  Plan for follow-up in 6 months or sooner if needed with lab work 1 to 2 weeks prior to appointment.      Relevant Medications   bictegravir-emtricitabine-tenofovir AF (BIKTARVY) 50-200-25 MG TABS tablet   Other Relevant Orders   T-helper cell (CD4)- ([facility] clinic only)   HIV-1 RNA quant-no reflex-bld   Comprehensive metabolic panel   Healthcare maintenance    Influenza up to date.  Hold vaccines today secondary to recent surgery - Prevnar20 following recovery Ok for RSV vaccine.  Due for colon cancer screening when appropriate.         I am having Djimon Lundstrom maintain his multivitamin, Oxycodone HCl, amLODipine, atenolol, clopidogrel, losartan-hydrochlorothiazide, rosuvastatin, Vascepa, Synjardy XR, Ofev, HYDROcodone bit-homatropine, ofloxacin, prednisoLONE acetate, and Biktarvy.  Meds ordered this encounter  Medications    bictegravir-emtricitabine-tenofovir AF (BIKTARVY) 50-200-25 MG TABS tablet    Sig: Take 1 tablet by mouth daily.    Dispense:  30 tablet    Refill:  11    Order Specific Question:   Supervising Provider    Answer:   Carlyle Basques [[id number]]     Follow-up: Return in about 6 months (around 07/04/2022), or if symptoms worsen or fail to improve.   [name], MSN, FNP-C Nurse Practitioner [facility] for Infectious Disease [place] number: [phone]

## 2022-01-02 ENCOUNTER — Encounter: Payer: Self-pay | Admitting: Family

## 2022-01-02 ENCOUNTER — Other Ambulatory Visit: Payer: Self-pay

## 2022-01-02 ENCOUNTER — Ambulatory Visit (INDEPENDENT_AMBULATORY_CARE_PROVIDER_SITE_OTHER): Payer: Medicare Other | Admitting: Family

## 2022-01-02 ENCOUNTER — Other Ambulatory Visit (HOSPITAL_COMMUNITY): Payer: Self-pay

## 2022-01-02 VITALS — BP 137/88 | HR 61 | Resp 16 | Ht 71.0 in | Wt 191.4 lb

## 2022-01-02 DIAGNOSIS — Z0000 Encounter for general adult medical examination without abnormal findings: Secondary | ICD-10-CM | POA: Diagnosis not present

## 2022-01-02 DIAGNOSIS — B20 Human immunodeficiency virus [HIV] disease: Secondary | ICD-10-CM

## 2022-01-02 DIAGNOSIS — I25118 Atherosclerotic heart disease of native coronary artery with other forms of angina pectoris: Secondary | ICD-10-CM | POA: Diagnosis not present

## 2022-01-02 MED ORDER — BIKTARVY 50-200-25 MG PO TABS
1.0000 | ORAL_TABLET | Freq: Every day | ORAL | 11 refills | Status: DC
  Filled 2022-01-02 – 2022-01-08 (×2): qty 30, 30d supply, fill #0
  Filled 2022-02-06: qty 30, 30d supply, fill #1
  Filled 2022-03-10: qty 30, 30d supply, fill #2
  Filled 2022-04-09: qty 30, 30d supply, fill #3
  Filled 2022-05-13: qty 30, 30d supply, fill #4
  Filled 2022-06-09: qty 30, 30d supply, fill #5
  Filled 2022-07-23: qty 30, 30d supply, fill #6
  Filled 2022-08-07: qty 30, 30d supply, fill #7
  Filled 2022-09-03: qty 30, 30d supply, fill #8
  Filled 2022-10-06: qty 30, 30d supply, fill #9
  Filled 2022-11-03: qty 30, 30d supply, fill #10
  Filled 2022-11-30: qty 30, 30d supply, fill #11

## 2022-01-02 NOTE — Patient Instructions (Signed)
Nice to see you.  Continue to take your medication daily as prescribed.  Refills have been sent to the pharmacy.  Plan for follow up in 6 months or sooner if needed with lab work 1-2 weeks prior to appointment.   Have a great day and stay safe!  

## 2022-01-02 NOTE — Assessment & Plan Note (Signed)
Tyler Hendricks continues to have well-controlled virus with good adherence and tolerance to [employer].  Reviewed lab work and discussed plan of care.  Continue current dose of Biktarvy.  Plan for follow-up in 6 months or sooner if needed with lab work 1 to 2 weeks prior to appointment.

## 2022-01-02 NOTE — Assessment & Plan Note (Signed)
   Influenza up to date.   Hold vaccines today secondary to recent surgery - Prevnar20 following recovery  Ok for RSV vaccine.   Due for colon cancer screening when appropriate.

## 2022-01-07 DIAGNOSIS — H33011 Retinal detachment with single break, right eye: Secondary | ICD-10-CM | POA: Diagnosis not present

## 2022-01-08 ENCOUNTER — Other Ambulatory Visit (HOSPITAL_COMMUNITY): Payer: Self-pay

## 2022-01-09 ENCOUNTER — Ambulatory Visit (INDEPENDENT_AMBULATORY_CARE_PROVIDER_SITE_OTHER): Payer: Medicare Other | Admitting: Podiatry

## 2022-01-09 DIAGNOSIS — L600 Ingrowing nail: Secondary | ICD-10-CM | POA: Diagnosis not present

## 2022-01-09 NOTE — Progress Notes (Signed)
Subjective: Tyler Hendricks is a 71 y.o.  male returns to office today for follow up evaluation after having right Hallux Medial border nail avulsion performed. Patient has been soaking using epsom salt and applying topical antibiotic covered with bandaid daily. Patient denies fevers, chills, nausea, vomiting. Denies any calf pain, chest pain, SOB.   Objective:  Vitals: Reviewed  General: Well developed, nourished, in no acute distress, alert and oriented x3   Dermatology: Skin is warm, dry and supple bilateral. Medial hallux nail border appears to be clean, dry, with mild granular tissue and surrounding scab. There is no surrounding erythema, edema, drainage/purulence. The remaining nails appear unremarkable at this time. There are no other lesions or other signs of infection present.  Neurovascular status: Intact. No lower extremity swelling; No pain with calf compression bilateral.  Musculoskeletal: Decreased tenderness to palpation of the Medial hallux nail fold(s). Muscular strength within normal limits bilateral.   Assesement and Plan: S/p partial nail avulsion, doing well.   -Continue soaking in epsom salts twice a day followed by antibiotic ointment and a band-aid as needed. Can leave uncovered at night. .  -If the area does not heal properply, call the office for follow-up appointment, or sooner if any problems arise.  -Monitor for any signs/symptoms of infection. Call the office immediately if any occur or go directly to the emergency room. Call with any questions/concerns.  Boneta Lucks, DPM

## 2022-01-19 ENCOUNTER — Telehealth: Payer: Self-pay

## 2022-01-19 ENCOUNTER — Other Ambulatory Visit (HOSPITAL_COMMUNITY): Payer: Self-pay

## 2022-01-19 NOTE — Telephone Encounter (Signed)
[facility] Patient Advocate Encounter   I was successful in securing patient a $7500.00 grant from [employer] (PAF) to provide copayment coverage for Biktarvy.  This will make the out of pocket cost $0.00.     I have spoken with the patient.    The billing information is as follows and has been shared with [place].        Patient knows to call the office with questions or concerns.  Ileene Patrick, [facility] Patient [facility] for Infectious Disease Phone: [phone] Fax:  [phone]

## 2022-01-23 ENCOUNTER — Encounter: Payer: Self-pay | Admitting: Pulmonary Disease

## 2022-01-23 ENCOUNTER — Ambulatory Visit (INDEPENDENT_AMBULATORY_CARE_PROVIDER_SITE_OTHER): Payer: Medicare Other | Admitting: Pulmonary Disease

## 2022-01-23 VITALS — BP 140/72 | HR 63 | Temp 97.7°F | Ht 71.0 in | Wt 206.0 lb

## 2022-01-23 DIAGNOSIS — R0602 Shortness of breath: Secondary | ICD-10-CM | POA: Diagnosis not present

## 2022-01-23 DIAGNOSIS — R053 Chronic cough: Secondary | ICD-10-CM | POA: Diagnosis not present

## 2022-01-23 DIAGNOSIS — Z5181 Encounter for therapeutic drug level monitoring: Secondary | ICD-10-CM

## 2022-01-23 DIAGNOSIS — I25118 Atherosclerotic heart disease of native coronary artery with other forms of angina pectoris: Secondary | ICD-10-CM

## 2022-01-23 DIAGNOSIS — J84112 Idiopathic pulmonary fibrosis: Secondary | ICD-10-CM

## 2022-01-23 LAB — COMPREHENSIVE METABOLIC PANEL
ALT: 20 U/L (ref 0–53)
AST: 30 U/L (ref 0–37)
Albumin: 3.4 g/dL — ABNORMAL LOW (ref 3.5–5.2)
Alkaline Phosphatase: 105 U/L (ref 39–117)
BUN: 15 mg/dL (ref 6–23)
CO2: 28 mEq/L (ref 19–32)
Calcium: 9.3 mg/dL (ref 8.4–10.5)
Chloride: 105 mEq/L (ref 96–112)
Creatinine, Ser: 0.52 mg/dL (ref 0.40–1.50)
GFR: 101.52 mL/min (ref >60.00)
Glucose, Bld: 178 mg/dL — ABNORMAL HIGH (ref 70–99)
Potassium: 3.9 mEq/L (ref 3.5–5.1)
Sodium: 140 mEq/L (ref 135–145)
Total Bilirubin: 2.6 mg/dL — ABNORMAL HIGH (ref 0.2–1.2)
Total Protein: 6 g/dL (ref 6.0–8.3)

## 2022-01-23 MED ORDER — HYDROCODONE BIT-HOMATROP MBR 5-1.5 MG/5ML PO SOLN: 5.0000 mL | Freq: Four times a day (QID) | ORAL | 0 refills | Status: DC | PRN

## 2022-01-23 NOTE — Progress Notes (Signed)
Tyler Hendricks    [id number]    [birth year]  Primary Care Physician:Crawford, Real Cons, MD  Referring Physician: Hoyt Koch, MD [address] [place],  [place] [address]  Problem list: Follow-up for pulmonary fibrosis Started on Ofev in late June 2022.   HPI: 72 y.o.  with history of coronary artery disease, HIV, hepatitis C, cirrhosis Complains of chronic cough for the past several years. He has paroxysms of cough associated with dyspnea. Mild chest congestion History notable for COVID-19 infection in December 2020. He had very mild symptoms and did not require hospitalization.  CT scan with probable UIP pattern pulmonary fibrosis. He has been referred to the ILD clinic for further evaluation  Pets: Has a dog. No cats, birds, farm animals Occupation: Retired Development worker, community Exposures: No known exposures. No mold, hot tub, Jacuzzi. No down pillows or comforters ILD questionnaire 10/03/2019-negative Smoking history: 60-pack-year smoker. Quit in late 1990s Travel history: Originally from [place]. Moved to [place] in 2018 Relevant family history: Mother had emphysema. She was a heavy smoker.  Interim history: Taking Ofev daily.  He is tolerating it well with minimal diarrhea Continues to have coughing which is improved somewhat with Hycodan cough syrup  Recently had retinal detachment and is following up with ophthalmology for that.  Outpatient Encounter Medications as of 01/23/2022  Medication Sig   amLODipine (NORVASC) 2.5 MG tablet Take 1 tablet (2.5 mg total) by mouth daily. TAKE 1 TABLET BY MOUTH EVERY DAY (Patient taking differently: Take 2.5 mg by mouth daily.)   atenolol (TENORMIN) 50 MG tablet Take 1 tablet (50 mg total) by mouth daily.   bictegravir-emtricitabine-tenofovir AF (BIKTARVY) 50-200-25 MG TABS tablet Take 1 tablet by mouth daily.   clopidogrel (PLAVIX) 75 MG tablet Take 1 tablet (75 mg total) by mouth daily.    HYDROcodone bit-homatropine (HYCODAN) 5-1.5 MG/5ML syrup Take 5 mLs by mouth every 6 (six) hours as needed for cough.   losartan-hydrochlorothiazide (HYZAAR) 100-25 MG tablet Take 1 tablet by mouth daily.   Multiple Vitamin (MULTIVITAMIN) tablet Take 1 tablet by mouth daily.   Nintedanib (OFEV) 100 MG CAPS TAKE 1 CAPSULE BY MOUTH TWICE DAILY WITH FOOD. (Patient taking differently: Take 100 mg by mouth 2 (two) times daily with a meal.)   ofloxacin (OCUFLOX) 0.3 % ophthalmic solution Place 1 drop into the right eye 4 (four) times daily.   Oxycodone HCl 10 MG TABS Take 10 mg by mouth as needed (Pain).   prednisoLONE acetate (PRED FORTE) 1 % ophthalmic suspension Place into the right eye.   rosuvastatin (CRESTOR) 10 MG tablet Take 1 tablet (10 mg total) by mouth daily.   SYNJARDY XR 12.07-998 MG TB24 Take 2 tablets by mouth daily.   VASCEPA 1 g capsule Take 2 capsules (2 g total) by mouth 2 (two) times daily.   No facility-administered encounter medications on file as of 01/23/2022.    Physical Exam: Blood pressure (!) 140/72, pulse 63, temperature 97.7 F (36.5 C), temperature source Oral, height '5\' 11"'$  (1.803 m), weight 206 lb (93.4 kg), SpO2 95 %. Gen:      No acute distress HEENT:  EOMI, sclera anicteric Neck:     No masses; no thyromegaly Lungs:    Bibasal crackles CV:         Regular rate and rhythm; no murmurs Abd:      + bowel sounds; soft, non-tender; no palpable masses, no distension Ext:  No edema; adequate peripheral perfusion Skin:      Warm and dry; no rash Neuro: alert and oriented x 3 Psych: normal mood and affect  Data Reviewed: Imaging: High-res CT 07/13/2019-groundglass attenuation with septal thickening, cylindrical bronchiectasis with mild basal gradient Probable UIP pattern High-res CT 06/24/2020-stable pulmonary fibrosis and probable UIP pattern I have reviewed the images personally  PFTs: 09/01/2019 FVC 3.78 [78%], FEV1 3.14 [87%], F/F 83, TLC 6.40 [86%], DLCO  22.76 [81%] Normal test  06/28/2020 FVC 3.70 [71%], FEV1 3.05 [85%], F/F 82, TLC 5.76 [79%], DLCO 19.32 [69%] Minimal restriction, mild diffusion defect  07/22/2021 FVC 3.70 [1 7%], FEV1 3.01 [85%], F/F 81, TLC 5.84 [78%], DLCO 15.89 [57%] Minimal restriction, moderate diffusion defect  Labs: CTD serologies 08/30/2019-positive for ANA 1:40, nuclear, speckled  Hepatic panel 12/30/2021-significant for AST 42, total bilirubin 4.3  Sleep: CPAP download 11/25/2019-2% compliance  Assessment:  Pulmonary fibrosis, IPF Has probable UIP pattern on imaging There is no symptoms of connective tissue disease. Borderline positive ANA is likely nonspecific He did have Covid in December 2020 but it was a very mild case with no hospitalization, besides his symptoms preceded the Covid infection  He likely has IPF based on CT appearance in male smoker. Discussed further work-up such as bronchoscopy, lung biopsy but he wants to avoid invasive procedures  After multiple discussions informed decision making with the patient we have started treatment with Ofev which is tolerating well so far.  We need to be cautious given history of cirrhosis CT shows stable pulmonary fibrosis but he does have a drop in diffusion capacity which will monitor Repeat LFTs as he had slightly elevated AST and bilirubin at last test  Refer to pulmonary rehab We discussed lung transplant and he wants to think more about.  Chronic cough Secondary to pulmonary fibrosis.  He is getting Hycodan cough syrup We discussed coming off Hycodan as he is also on oxy for chronic pain and he is trying to limit use of cough medication as much as possible. Discussed use of PPI for treatment of silent reflux though he does not have overt symptoms but he does not want to be on multiple medications  Severe OSA Noncompliant with CPAP.  Encouraged him to use it daily.  Health maintenance Up-to-date with Covid booster and flu  vaccine  Plan/Recommendations: Continue Ofev Continue lab monitoring Pulmonary rehab Follow-up CT and PFTs  Marshell Garfinkel MD [facility] 01/23/2022, 9:55 AM  CC: Hoyt Koch, *

## 2022-01-23 NOTE — Patient Instructions (Addendum)
Continue Ofev Check comprehensive metabolic panel and proBNP for dyspnea today  Order high-resolution CT and PFTs in 3 to 4 months Return to clinic after these tests in 3 to 4 months.

## 2022-01-24 LAB — PRO B NATRIURETIC PEPTIDE: NT-Pro BNP: 106 pg/mL (ref 0–376)

## 2022-01-29 DIAGNOSIS — H353112 Nonexudative age-related macular degeneration, right eye, intermediate dry stage: Secondary | ICD-10-CM | POA: Diagnosis not present

## 2022-01-29 DIAGNOSIS — H35453 Secondary pigmentary degeneration, bilateral: Secondary | ICD-10-CM | POA: Diagnosis not present

## 2022-01-29 DIAGNOSIS — H31091 Other chorioretinal scars, right eye: Secondary | ICD-10-CM | POA: Diagnosis not present

## 2022-01-29 DIAGNOSIS — Z961 Presence of intraocular lens: Secondary | ICD-10-CM | POA: Diagnosis not present

## 2022-01-29 DIAGNOSIS — H353121 Nonexudative age-related macular degeneration, left eye, early dry stage: Secondary | ICD-10-CM | POA: Diagnosis not present

## 2022-01-29 DIAGNOSIS — H35363 Drusen (degenerative) of macula, bilateral: Secondary | ICD-10-CM | POA: Diagnosis not present

## 2022-02-02 ENCOUNTER — Telehealth (HOSPITAL_COMMUNITY): Payer: Self-pay

## 2022-02-02 NOTE — Telephone Encounter (Signed)
Pt is not interested in the pulmonary rehab program. Closed referral. 

## 2022-02-06 ENCOUNTER — Other Ambulatory Visit (HOSPITAL_COMMUNITY): Payer: Self-pay

## 2022-02-16 ENCOUNTER — Other Ambulatory Visit (HOSPITAL_COMMUNITY): Payer: Self-pay

## 2022-02-17 ENCOUNTER — Telehealth: Payer: Self-pay | Admitting: Pulmonary Disease

## 2022-02-17 MED ORDER — HYDROCODONE BIT-HOMATROP MBR 5-1.5 MG/5ML PO SOLN: 5.0000 mL | Freq: Four times a day (QID) | ORAL | 0 refills | Status: DC | PRN

## 2022-02-17 NOTE — Telephone Encounter (Signed)
Called and spoke with pt letting him know that his medication had been refilled and he verbalized understanding. Nothing further needed.

## 2022-02-17 NOTE — Telephone Encounter (Signed)
Patient called to request a refill for his hydrocodone bit-homatropine cough medication.  Please advise.

## 2022-02-17 NOTE — Telephone Encounter (Signed)
Hycodan has been reordered

## 2022-02-17 NOTE — Telephone Encounter (Signed)
Called and spoke with pt who is requesting to have a refill of his hycodan cough syrup. Preferred pharmacy is [facility] of [address].  Dr.Mannam, please advise on this for pt.

## 2022-02-18 DIAGNOSIS — H35033 Hypertensive retinopathy, bilateral: Secondary | ICD-10-CM | POA: Diagnosis not present

## 2022-02-18 DIAGNOSIS — M5136 Other intervertebral disc degeneration, lumbar region: Secondary | ICD-10-CM | POA: Diagnosis not present

## 2022-02-18 DIAGNOSIS — H0288B Meibomian gland dysfunction left eye, upper and lower eyelids: Secondary | ICD-10-CM | POA: Diagnosis not present

## 2022-02-18 DIAGNOSIS — E119 Type 2 diabetes mellitus without complications: Secondary | ICD-10-CM | POA: Diagnosis not present

## 2022-02-18 DIAGNOSIS — Z9889 Other specified postprocedural states: Secondary | ICD-10-CM | POA: Diagnosis not present

## 2022-02-18 DIAGNOSIS — H04123 Dry eye syndrome of bilateral lacrimal glands: Secondary | ICD-10-CM | POA: Diagnosis not present

## 2022-02-18 DIAGNOSIS — H0102A Squamous blepharitis right eye, upper and lower eyelids: Secondary | ICD-10-CM | POA: Diagnosis not present

## 2022-02-18 DIAGNOSIS — H0102B Squamous blepharitis left eye, upper and lower eyelids: Secondary | ICD-10-CM | POA: Diagnosis not present

## 2022-02-18 DIAGNOSIS — H2512 Age-related nuclear cataract, left eye: Secondary | ICD-10-CM | POA: Diagnosis not present

## 2022-02-18 DIAGNOSIS — H0288A Meibomian gland dysfunction right eye, upper and lower eyelids: Secondary | ICD-10-CM | POA: Diagnosis not present

## 2022-02-18 DIAGNOSIS — H353132 Nonexudative age-related macular degeneration, bilateral, intermediate dry stage: Secondary | ICD-10-CM | POA: Diagnosis not present

## 2022-02-26 DIAGNOSIS — D492 Neoplasm of unspecified behavior of bone, soft tissue, and skin: Secondary | ICD-10-CM | POA: Diagnosis not present

## 2022-02-26 DIAGNOSIS — C4442 Squamous cell carcinoma of skin of scalp and neck: Secondary | ICD-10-CM | POA: Diagnosis not present

## 2022-02-26 DIAGNOSIS — R208 Other disturbances of skin sensation: Secondary | ICD-10-CM | POA: Diagnosis not present

## 2022-02-27 LAB — HM DIABETES EYE EXAM

## 2022-03-02 ENCOUNTER — Other Ambulatory Visit: Payer: Self-pay | Admitting: Pulmonary Disease

## 2022-03-02 DIAGNOSIS — J84112 Idiopathic pulmonary fibrosis: Secondary | ICD-10-CM

## 2022-03-02 NOTE — Telephone Encounter (Signed)
Refill sent for OFEV to [facility] (pulmonary fibrosis team): [phone]  Dose: 100 mg twice daily  Last OV: 01/23/2022 Provider: Dr. Vaughan Browner  Next OV: not yet scheduled (3-4 months after repeat PFTs and HRCT)  Knox Saliva, PharmD, MPH, BCPS Clinical Pharmacist (Rheumatology and Pulmonology)

## 2022-03-04 DIAGNOSIS — L989 Disorder of the skin and subcutaneous tissue, unspecified: Secondary | ICD-10-CM | POA: Diagnosis not present

## 2022-03-04 DIAGNOSIS — C4442 Squamous cell carcinoma of skin of scalp and neck: Secondary | ICD-10-CM | POA: Diagnosis not present

## 2022-03-09 ENCOUNTER — Telehealth: Payer: Self-pay | Admitting: Pulmonary Disease

## 2022-03-09 DIAGNOSIS — R053 Chronic cough: Secondary | ICD-10-CM

## 2022-03-09 NOTE — Telephone Encounter (Signed)
Calling again:  Pt requesting cough syrup, hycodan, sent to [facility] on [address]

## 2022-03-09 NOTE — Telephone Encounter (Signed)
Spoke with pt who is requesting Hycodan refill. Last refill was on 02/17/22 for 480 mL. Dr. Vaughan Browner please advise and send order if needed. Thank you

## 2022-03-09 NOTE — Telephone Encounter (Signed)
ATC VMTCB x 1

## 2022-03-10 ENCOUNTER — Other Ambulatory Visit (HOSPITAL_COMMUNITY): Payer: Self-pay

## 2022-03-11 DIAGNOSIS — H35033 Hypertensive retinopathy, bilateral: Secondary | ICD-10-CM | POA: Diagnosis not present

## 2022-03-11 DIAGNOSIS — H353132 Nonexudative age-related macular degeneration, bilateral, intermediate dry stage: Secondary | ICD-10-CM | POA: Diagnosis not present

## 2022-03-11 DIAGNOSIS — H0102B Squamous blepharitis left eye, upper and lower eyelids: Secondary | ICD-10-CM | POA: Diagnosis not present

## 2022-03-11 DIAGNOSIS — H0288B Meibomian gland dysfunction left eye, upper and lower eyelids: Secondary | ICD-10-CM | POA: Diagnosis not present

## 2022-03-11 DIAGNOSIS — H0288A Meibomian gland dysfunction right eye, upper and lower eyelids: Secondary | ICD-10-CM | POA: Diagnosis not present

## 2022-03-11 DIAGNOSIS — E119 Type 2 diabetes mellitus without complications: Secondary | ICD-10-CM | POA: Diagnosis not present

## 2022-03-11 DIAGNOSIS — H2512 Age-related nuclear cataract, left eye: Secondary | ICD-10-CM | POA: Diagnosis not present

## 2022-03-11 DIAGNOSIS — H33001 Unspecified retinal detachment with retinal break, right eye: Secondary | ICD-10-CM | POA: Diagnosis not present

## 2022-03-11 DIAGNOSIS — H0102A Squamous blepharitis right eye, upper and lower eyelids: Secondary | ICD-10-CM | POA: Diagnosis not present

## 2022-03-11 DIAGNOSIS — Z961 Presence of intraocular lens: Secondary | ICD-10-CM | POA: Diagnosis not present

## 2022-03-11 DIAGNOSIS — H04123 Dry eye syndrome of bilateral lacrimal glands: Secondary | ICD-10-CM | POA: Diagnosis not present

## 2022-03-11 LAB — HM DIABETES EYE EXAM

## 2022-03-11 MED ORDER — HYDROCODONE BIT-HOMATROP MBR 5-1.5 MG/5ML PO SOLN: 5.0000 mL | Freq: Four times a day (QID) | ORAL | 0 refills | Status: DC | PRN

## 2022-03-11 NOTE — Telephone Encounter (Signed)
Called and spoke to patient and let him know that his refill was sent in. Nothing further needed

## 2022-03-11 NOTE — Telephone Encounter (Signed)
Refill has been sent in.  

## 2022-03-12 ENCOUNTER — Other Ambulatory Visit: Payer: Self-pay

## 2022-03-17 ENCOUNTER — Telehealth: Payer: Self-pay | Admitting: Pulmonary Disease

## 2022-03-17 MED ORDER — HYDROCODONE BIT-HOMATROP MBR 5-1.5 MG/5ML PO SOLN: 5.0000 mL | Freq: Four times a day (QID) | ORAL | 0 refills | Status: DC | PRN

## 2022-03-17 NOTE — Telephone Encounter (Signed)
Spoke with the pt and notified that rx was sent  Nothing further needed

## 2022-03-17 NOTE — Telephone Encounter (Signed)
Called and spoke with the pt  [facility] not [facility]  He called [facility] [address] and they have it available there  Dr Vaughan Browner sent hydcodan 03/11/22 # 480 ml  Please advise thanks

## 2022-03-17 NOTE — Telephone Encounter (Signed)
Error

## 2022-03-17 NOTE — Telephone Encounter (Signed)
Hycodan rewritten

## 2022-03-17 NOTE — Telephone Encounter (Signed)
Spoke [facility]- confirmed that the hycodan is on back order there and I went ahead and cancelled it. Thanks.

## 2022-03-17 NOTE — Telephone Encounter (Signed)
Can we call pharmacy to verify

## 2022-03-17 NOTE — Telephone Encounter (Signed)
Dr Annamaria Boots- I had to cancel the rx for hycodan at [facility], can you send to [facility] [address]   Rx had been [facility] by Dr Vaughan Browner [facility], but they do not have this in stock and I have cancelled

## 2022-03-25 ENCOUNTER — Other Ambulatory Visit: Payer: Medicare Other

## 2022-03-26 ENCOUNTER — Telehealth: Payer: Self-pay | Admitting: Pulmonary Disease

## 2022-03-26 MED ORDER — HYDROCODONE BIT-HOMATROP MBR 5-1.5 MG/5ML PO SOLN: 5.0000 mL | Freq: Four times a day (QID) | ORAL | 0 refills | Status: DC | PRN

## 2022-03-26 NOTE — Telephone Encounter (Signed)
Please reschedule PFTs I have sent in a refill for cough syrup

## 2022-03-26 NOTE — Telephone Encounter (Addendum)
Accidentally closed encounter.   Called and spoke with pt who states that he was diagnosed with covid 12/30. Pt states that his symptoms first began on 12/30 as he was having problems with a lot of postnasal drainage and stated that with his cough, he was beginning to get up more mucus than he normally did.  Pt said that he still has a lingering cough with a lot of phlegm production and also states that he still has a lot of postnasal drainage.  Pt said when he first tested positive for covid, he had a low grade temp of 99.3 but states now his temp is back down to normal at 98.    Pt stated that he needs a refill of his hycodan cough syrup.  Also, pt is currently scheduled to have PFT with an OV after on 1/8 and he needs to know if he is okay to still keep appts or if they need to be rescheduled.   Dr. Vaughan Browner, please advise on all this for pt.

## 2022-03-26 NOTE — Addendum Note (Signed)
Addended byMarshell Garfinkel on: 03/26/2022 10:39 AM   Modules accepted: Orders

## 2022-03-26 NOTE — Addendum Note (Signed)
Addended by: Lorretta Harp on: 03/26/2022 10:26 AM   Modules accepted: Orders

## 2022-03-26 NOTE — Telephone Encounter (Signed)
I have sent it to the pharmacy

## 2022-03-26 NOTE — Telephone Encounter (Signed)
Have made [facility] as the pharmacy showing. Routing this back to Dr. Vaughan Browner so he can fix Rx.

## 2022-03-26 NOTE — Telephone Encounter (Signed)
Called and spoke with pt letting him know that Dr. Vaughan Browner refilled his cough med and also let him know that we did need to reschedule appts. Pt verbalized understanding. Appts have been rescheduled. Nothing further needed.

## 2022-03-27 NOTE — Telephone Encounter (Signed)
Notified pt that medication was called in by Dr. Vaughan Browner. Nothing further needed at this time

## 2022-03-30 ENCOUNTER — Ambulatory Visit: Payer: Medicare Other | Admitting: Pulmonary Disease

## 2022-04-02 ENCOUNTER — Telehealth: Payer: Self-pay | Admitting: Pulmonary Disease

## 2022-04-02 NOTE — Telephone Encounter (Signed)
Called and spoke with pt letting him know the info per Dr.Mannam and he verbalized understanding.nothing further needed.

## 2022-04-02 NOTE — Telephone Encounter (Signed)
Routing to Dr. Vaughan Browner as high priority. Have pharmacy showing as [facility] off [address] and [address].

## 2022-04-02 NOTE — Telephone Encounter (Signed)
PT calling for refill of cough syrup. Adv stay near phone for nurses cb.  His # [phone]

## 2022-04-02 NOTE — Telephone Encounter (Signed)
Spoke with pt who is requesting refill of Hycodan (he is currently out) Pharmacy is [facility] on [address]. Dr. Vaughan Browner please advise.

## 2022-04-02 NOTE — Telephone Encounter (Signed)
PT calling again to see if filled. Adv waiting on Dr.

## 2022-04-02 NOTE — Telephone Encounter (Addendum)
I have sent a prescription already on 03/26/22 for hydocan to [facility] on [address]. The prescription is active in the chart. Please have him check  Prior to that he has 4 prescriptions sent for hydocan sent the last month so we will not be sending any more prescriptions now

## 2022-04-09 ENCOUNTER — Other Ambulatory Visit (HOSPITAL_COMMUNITY): Payer: Self-pay

## 2022-04-14 ENCOUNTER — Other Ambulatory Visit: Payer: Self-pay

## 2022-04-17 ENCOUNTER — Encounter: Payer: Self-pay | Admitting: Pulmonary Disease

## 2022-04-17 ENCOUNTER — Ambulatory Visit (INDEPENDENT_AMBULATORY_CARE_PROVIDER_SITE_OTHER): Payer: Medicare Other | Admitting: Pulmonary Disease

## 2022-04-17 VITALS — BP 116/60 | HR 66 | Temp 98.3°F | Ht 72.0 in | Wt 198.8 lb

## 2022-04-17 DIAGNOSIS — Z5181 Encounter for therapeutic drug level monitoring: Secondary | ICD-10-CM | POA: Diagnosis not present

## 2022-04-17 DIAGNOSIS — J84112 Idiopathic pulmonary fibrosis: Secondary | ICD-10-CM

## 2022-04-17 LAB — PULMONARY FUNCTION TEST
DL/VA % pred: 69 %
DL/VA: 2.77 ml/min/mmHg/L
DLCO cor % pred: 52 %
DLCO cor: 14.25 ml/min/mmHg
DLCO unc % pred: 52 %
DLCO unc: 14.25 ml/min/mmHg
FEF 25-75 Post: 2.39 L/sec
FEF 25-75 Pre: 2.15 L/sec
FEF2575-%Change-Post: 10 %
FEF2575-%Pred-Post: 91 %
FEF2575-%Pred-Pre: 82 %
FEV1-%Change-Post: 1 %
FEV1-%Pred-Post: 82 %
FEV1-%Pred-Pre: 81 %
FEV1-Post: 2.86 L
FEV1-Pre: 2.82 L
FEV1FVC-%Change-Post: 2 %
FEV1FVC-%Pred-Pre: 103 %
FEV6-%Change-Post: 0 %
FEV6-%Pred-Post: 82 %
FEV6-%Pred-Pre: 82 %
FEV6-Post: 3.68 L
FEV6-Pre: 3.69 L
FEV6FVC-%Change-Post: 0 %
FEV6FVC-%Pred-Post: 105 %
FEV6FVC-%Pred-Pre: 104 %
FVC-%Change-Post: 0 %
FVC-%Pred-Post: 77 %
FVC-%Pred-Pre: 78 %
FVC-Post: 3.69 L
FVC-Pre: 3.72 L
Post FEV1/FVC ratio: 78 %
Post FEV6/FVC ratio: 100 %
Pre FEV1/FVC ratio: 76 %
Pre FEV6/FVC Ratio: 99 %
RV % pred: 69 %
RV: 1.79 L
TLC % pred: 73 %
TLC: 5.49 L

## 2022-04-17 LAB — COMPREHENSIVE METABOLIC PANEL
ALT: 21 U/L (ref 0–53)
AST: 28 U/L (ref 0–37)
Albumin: 3.7 g/dL (ref 3.5–5.2)
Alkaline Phosphatase: 95 U/L (ref 39–117)
BUN: 16 mg/dL (ref 6–23)
CO2: 27 mEq/L (ref 19–32)
Calcium: 9.4 mg/dL (ref 8.4–10.5)
Chloride: 105 mEq/L (ref 96–112)
Creatinine, Ser: 0.62 mg/dL (ref 0.40–1.50)
GFR: 96.12 mL/min (ref >60.00)
Glucose, Bld: 175 mg/dL — ABNORMAL HIGH (ref 70–99)
Potassium: 3.9 mEq/L (ref 3.5–5.1)
Sodium: 141 mEq/L (ref 135–145)
Total Bilirubin: 2.9 mg/dL — ABNORMAL HIGH (ref 0.2–1.2)
Total Protein: 6.6 g/dL (ref 6.0–8.3)

## 2022-04-17 MED ORDER — PANTOPRAZOLE SODIUM 40 MG PO TBEC: 40.0000 mg | DELAYED_RELEASE_TABLET | Freq: Two times a day (BID) | ORAL | 5 refills | Status: DC

## 2022-04-17 NOTE — Progress Notes (Signed)
Tyler Hendricks    [id number]    04-21-1950  Primary Care Physician:Crawford, Real Cons, MD  Referring Physician: Hoyt Koch, MD [address] [place],  [place] [address]  Problem list: Follow-up for pulmonary fibrosis Started on Ofev in late June 2022.   HPI: 72 y.o.  with history of coronary artery disease, HIV, hepatitis C, cirrhosis Complains of chronic cough for the past several years. He has paroxysms of cough associated with dyspnea. Mild chest congestion History notable for COVID-19 infection in December 2020. He had very mild symptoms and did not require hospitalization.  CT scan with probable UIP pattern pulmonary fibrosis. He has been referred to the ILD clinic for further evaluation  Pets: Has a dog. No cats, birds, farm animals Occupation: Retired Development worker, community Exposures: No known exposures. No mold, hot tub, Jacuzzi. No down pillows or comforters ILD questionnaire 10/03/2019-negative Smoking history: 60-pack-year smoker. Quit in late 1990s Travel history: Originally from [place]. Moved to [place] in 2018 Relevant family history: Mother had emphysema. She was a heavy smoker.  Interim history: Taking Ofev daily.  He is tolerating it well with minimal diarrhea Cough is his main complaint today for which she is taking codeine cough medication He is here for review of PFTs  Outpatient Encounter Medications as of 04/17/2022  Medication Sig   amLODipine (NORVASC) 2.5 MG tablet Take 1 tablet (2.5 mg total) by mouth daily. TAKE 1 TABLET BY MOUTH EVERY DAY (Patient taking differently: Take 2.5 mg by mouth daily.)   atenolol (TENORMIN) 50 MG tablet Take 1 tablet (50 mg total) by mouth daily.   bictegravir-emtricitabine-tenofovir AF (BIKTARVY) 50-200-25 MG TABS tablet Take 1 tablet by mouth daily.   clopidogrel (PLAVIX) 75 MG tablet Take 1 tablet (75 mg total) by mouth daily.   HYDROcodone bit-homatropine (HYCODAN) 5-1.5  MG/5ML syrup Take 5 mLs by mouth every 6 (six) hours as needed for cough.   losartan-hydrochlorothiazide (HYZAAR) 100-25 MG tablet Take 1 tablet by mouth daily.   Multiple Vitamin (MULTIVITAMIN) tablet Take 1 tablet by mouth daily.   Nintedanib (OFEV) 100 MG CAPS TAKE 1 CAPSULE BY MOUTH TWICE DAILY WITH FOOD.   ofloxacin (OCUFLOX) 0.3 % ophthalmic solution Place 1 drop into the right eye 4 (four) times daily.   Oxycodone HCl 10 MG TABS Take 10 mg by mouth as needed (Pain).   prednisoLONE acetate (PRED FORTE) 1 % ophthalmic suspension Place into the right eye.   rosuvastatin (CRESTOR) 10 MG tablet Take 1 tablet (10 mg total) by mouth daily.   SYNJARDY XR 12.07-998 MG TB24 Take 2 tablets by mouth daily.   VASCEPA 1 g capsule Take 2 capsules (2 g total) by mouth 2 (two) times daily.   No facility-administered encounter medications on file as of 04/17/2022.    Physical Exam: Blood pressure 116/60, pulse 66, temperature 98.3 F (36.8 C), temperature source Oral, height 6' (1.829 m), weight 198 lb 12.8 oz (90.2 kg), SpO2 94 %. Gen:      No acute distress HEENT:  EOMI, sclera anicteric Neck:     No masses; no thyromegaly Lungs:    Bibasal crackles CV:         Regular rate and rhythm; no murmurs Abd:      + bowel sounds; soft, non-tender; no palpable masses, no distension Ext:    No edema; adequate peripheral perfusion Skin:      Warm and dry; no rash Neuro:  alert and oriented x 3 Psych: normal mood and affect   Data Reviewed: Imaging: High-res CT 07/13/2019-groundglass attenuation with septal thickening, cylindrical bronchiectasis with mild basal gradient Probable UIP pattern High-res CT 06/24/2020-stable pulmonary fibrosis and probable UIP pattern I have reviewed the images personally  PFTs: 09/01/2019 FVC 3.78 [78%], FEV1 3.14 [87%], F/F 83, TLC 6.40 [86%], DLCO 22.76 [81%] Normal test  06/28/2020 FVC 3.70 [71%], FEV1 3.05 [85%], F/F 82, TLC 5.76 [79%], DLCO 19.32 [69%] Minimal  restriction, mild diffusion defect  07/22/2021 FVC 3.70 [1 7%], FEV1 3.01 [85%], F/F 81, TLC 5.84 [78%], DLCO 15.89 [57%] Minimal restriction, moderate diffusion defect  04/17/2022 FVC 3.69 [77%], FEV1 2.86 [82%], F/F78, TLC 5.49 [73%], DLCO 14.25 [52%] Mild restriction, moderate diffusion defect  Labs: CTD serologies 08/30/2019-positive for ANA 1:40, nuclear, speckled  Hepatic panel 12/30/2021-significant for AST 42, total bilirubin 4.3  Sleep: CPAP download 11/25/2019-2% compliance  Assessment:  Pulmonary fibrosis, IPF Has probable UIP pattern on imaging There is no symptoms of connective tissue disease. Borderline positive ANA is likely nonspecific He did have Covid in December 2020 but it was a very mild case with no hospitalization, besides his symptoms preceded the Covid infection  He likely has IPF based on CT appearance in male smoker. Discussed further work-up such as bronchoscopy, lung biopsy but he wants to avoid invasive procedures  After multiple discussions informed decision making with the patient we have started treatment with Ofev which is tolerating well so far.  We need to be cautious given history of cirrhosis CT shows stable pulmonary fibrosis but he does have a drop in diffusion capacity which will monitor Repeat LFTs  Refer to pulmonary rehab We discussed lung transplant and he wants to think more about.  Chronic cough Secondary to pulmonary fibrosis.  He is getting Hycodan cough syrup We discussed coming off Hycodan as he is also on oxy for chronic pain and he is trying to limit use of cough medication as much as possible. Add PPI twice daily as his cough continues to be debilitating  Severe OSA Noncompliant with CPAP.  Encouraged him to use it daily.  Health maintenance Up-to-date with Covid booster and flu vaccine  Plan/Recommendations: Continue Ofev Continue lab monitoring Start PPI twice daily Use CPAP Follow-up in 3 months after high-res  CT  Marshell Garfinkel MD [facility] 04/17/2022, 1:29 PM  CC: Hoyt Koch, *

## 2022-04-17 NOTE — Patient Instructions (Signed)
Will check a comprehensive metabolic panel today Continue Ofev Start Protonix 40 mg twice daily for acid reflux Please use a CPAP on a regular basis Follow-up in 3 months

## 2022-04-17 NOTE — Progress Notes (Signed)
PFT done today. 

## 2022-04-20 ENCOUNTER — Telehealth: Payer: Self-pay | Admitting: Pulmonary Disease

## 2022-04-20 NOTE — Telephone Encounter (Signed)
PT was calling for cough medicine not called in yet. [facility]   Pharm that has it is [facility] on [address]/

## 2022-04-21 ENCOUNTER — Ambulatory Visit
Admission: RE | Admit: 2022-04-21 | Discharge: 2022-04-21 | Disposition: A | Discharge status: Home / Self Care | Payer: Medicare Other | Source: Ambulatory Visit | Attending: Pulmonary Disease | Admitting: Pulmonary Disease

## 2022-04-21 DIAGNOSIS — H353112 Nonexudative age-related macular degeneration, right eye, intermediate dry stage: Secondary | ICD-10-CM | POA: Diagnosis not present

## 2022-04-21 DIAGNOSIS — J84112 Idiopathic pulmonary fibrosis: Secondary | ICD-10-CM

## 2022-04-21 DIAGNOSIS — S2220XA Unspecified fracture of sternum, initial encounter for closed fracture: Secondary | ICD-10-CM | POA: Diagnosis not present

## 2022-04-21 DIAGNOSIS — H35371 Puckering of macula, right eye: Secondary | ICD-10-CM | POA: Diagnosis not present

## 2022-04-21 DIAGNOSIS — H35363 Drusen (degenerative) of macula, bilateral: Secondary | ICD-10-CM | POA: Diagnosis not present

## 2022-04-21 DIAGNOSIS — Z961 Presence of intraocular lens: Secondary | ICD-10-CM | POA: Diagnosis not present

## 2022-04-21 DIAGNOSIS — H35453 Secondary pigmentary degeneration, bilateral: Secondary | ICD-10-CM | POA: Diagnosis not present

## 2022-04-21 DIAGNOSIS — H43392 Other vitreous opacities, left eye: Secondary | ICD-10-CM | POA: Diagnosis not present

## 2022-04-21 DIAGNOSIS — H353121 Nonexudative age-related macular degeneration, left eye, early dry stage: Secondary | ICD-10-CM | POA: Diagnosis not present

## 2022-04-21 DIAGNOSIS — J479 Bronchiectasis, uncomplicated: Secondary | ICD-10-CM | POA: Diagnosis not present

## 2022-04-21 DIAGNOSIS — I700 Atherosclerosis of aorta: Secondary | ICD-10-CM | POA: Diagnosis not present

## 2022-04-21 NOTE — Telephone Encounter (Signed)
Called and spoke with patient.  Patient stated [facility]. Patient has checked pharmacy and  is requesting refill for Hycodan to be sent to [facility].   Last prescription was 03/26/22 #120, no refills.  Message routed to Dr. Vaughan Browner

## 2022-04-22 ENCOUNTER — Other Ambulatory Visit (INDEPENDENT_AMBULATORY_CARE_PROVIDER_SITE_OTHER): Payer: Medicare Other

## 2022-04-22 ENCOUNTER — Encounter: Payer: Self-pay | Admitting: Physician Assistant

## 2022-04-22 ENCOUNTER — Ambulatory Visit (INDEPENDENT_AMBULATORY_CARE_PROVIDER_SITE_OTHER): Payer: Medicare Other | Admitting: Physician Assistant

## 2022-04-22 ENCOUNTER — Ambulatory Visit: Payer: Medicare Other | Admitting: Gastroenterology

## 2022-04-22 ENCOUNTER — Telehealth: Payer: Self-pay

## 2022-04-22 ENCOUNTER — Other Ambulatory Visit: Payer: Self-pay

## 2022-04-22 VITALS — BP 124/78 | HR 64 | Ht 72.0 in | Wt 199.0 lb

## 2022-04-22 DIAGNOSIS — Z86010 Personal history of colonic polyps: Secondary | ICD-10-CM

## 2022-04-22 DIAGNOSIS — K7460 Unspecified cirrhosis of liver: Secondary | ICD-10-CM

## 2022-04-22 DIAGNOSIS — Z1211 Encounter for screening for malignant neoplasm of colon: Secondary | ICD-10-CM

## 2022-04-22 DIAGNOSIS — K31A0 Gastric intestinal metaplasia, unspecified: Secondary | ICD-10-CM

## 2022-04-22 DIAGNOSIS — I8500 Esophageal varices without bleeding: Secondary | ICD-10-CM

## 2022-04-22 DIAGNOSIS — R109 Unspecified abdominal pain: Secondary | ICD-10-CM | POA: Insufficient documentation

## 2022-04-22 LAB — CBC WITH DIFFERENTIAL/PLATELET
Basophils Absolute: 0.1 10*3/uL (ref 0.0–0.1)
Basophils Relative: 1.1 % (ref 0.0–3.0)
Eosinophils Absolute: 0.2 10*3/uL (ref 0.0–0.7)
Eosinophils Relative: 4 % (ref 0.0–5.0)
HCT: 47.6 % (ref 39.0–52.0)
Hemoglobin: 16.7 g/dL (ref 13.0–17.0)
Lymphocytes Relative: 33 % (ref 12.0–46.0)
Lymphs Abs: 1.7 10*3/uL (ref 0.7–4.0)
MCHC: 35 g/dL (ref 30.0–36.0)
MCV: 99.6 fl (ref 78.0–100.0)
Monocytes Absolute: 0.5 10*3/uL (ref 0.1–1.0)
Monocytes Relative: 10.2 % (ref 3.0–12.0)
Neutro Abs: 2.6 10*3/uL (ref 1.4–7.7)
Neutrophils Relative %: 51.7 % (ref 43.0–77.0)
Platelets: 133 10*3/uL — ABNORMAL LOW (ref 150.0–400.0)
RBC: 4.78 Mil/uL (ref 4.22–5.81)
RDW: 15.2 % (ref 11.5–15.5)
WBC: 5 10*3/uL (ref 4.0–10.5)

## 2022-04-22 LAB — AMMONIA: Ammonia: 110 umol/L — ABNORMAL HIGH (ref 11–35)

## 2022-04-22 LAB — PROTIME-INR
INR: 1.2 ratio — ABNORMAL HIGH (ref 0.8–1.0)
Prothrombin Time: 12.9 s (ref 9.6–13.1)

## 2022-04-22 MED ORDER — SUTAB 1479-225-188 MG PO TABS: 24.0000 | ORAL_TABLET | ORAL | 0 refills | Status: DC

## 2022-04-22 MED ORDER — HYDROCODONE BIT-HOMATROP MBR 5-1.5 MG/5ML PO SOLN: 5.0000 mL | Freq: Four times a day (QID) | ORAL | 0 refills | Status: DC | PRN

## 2022-04-22 NOTE — Progress Notes (Signed)
Subjective:    Patient ID: Tyler Hendricks, male    DOB: [birth year], 72 y.o.   MRN: [id number]  HPI Tyler Hendricks is a 72 year old white male, established with Dr. Rush Landmark,, with history of cirrhosis without ascites, unspecified type with previous positive hepatitis C antibody but negative RNA.  Cirrhosis has been complicated by esophageal varices, no previous ascites.  He also has history of adenomatous colon polyps, portal gastropathy and gastric intestinal metaplasia. He was last seen in the office by Dr. Rush Landmark in fall 2020 and comes in today to discuss a follow-up colonoscopy. Patient also has history of coronary artery disease status post multivessel stents in 2020, is maintained on chronic Plavix and followed by Dr. Irish Lack.  He has moderate aortic stenosis with most recent echo June 2023 showing normal LV and right V function with moderate aortic stenosis with aortic valve area of 1.28 cm History of interstitial lung disease, sleep apnea with CPAP use but no oxygen, hypertension, diabetes mellitus, HIV for which she is on Biktarvy and BPH.  He last had colonoscopy in August 2020 with removal of 9 sessile polyps which were 2 to 6 mm in size, he was noted to have some patchy inflammation in the transverse a sending colon and cecum.  Biopsies showed no evidence of colitis and the polyps were tubular adenomas.  Indicated for 3-year interval follow-up. He last had EGD in November 2020 which showed grade 1 esophageal varices, moderate diffuse portal gastropathy with some nodularity in the entire stomach.  Biopsies of the stomach showed no evidence of H. pylori, did show evidence of chronic inflammation and intestinal metaplasia in 4 out of 6 regions.  Last ultrasound September 2022 with cirrhotic appearing liver, no liver lesion noted portal vein patent and no ascites.  Most recent labs January 2024 with sodium 141/potassium 3.9/BUN 26/creatinine 0.62 T. bili 2.9 LFTs normal albumin 3.7 has not  had recent CBC, INR etc.   He says he has been feeling okay, and his usual state of health recently.  He has been stable from a cardiac standpoint over the past year.  He is not having any problems with heartburn or dysphagia, he has noticed some abdominal discomfort/queasiness over the past several days which is new.  And on further questioning does feel that his abdominal girth has increased a bit over the past months.  He denies any problems with diarrhea changes in bowel habits no melena or hematochezia.  He has been taking Protonix 40 mg twice daily. He is not excited about having further procedures done but does want to proceed with EGD and colonoscopy at this time. He is asking for a smaller volume prep, and does not want Suprep as this caused his wife abdominal pain and vomiting.   Review of Systems Pertinent positive and negative review of systems were noted in the above HPI section.  All other review of systems was otherwise negative.   Outpatient Encounter Medications as of 04/22/2022  Medication Sig   amLODipine (NORVASC) 2.5 MG tablet Take 1 tablet (2.5 mg total) by mouth daily. TAKE 1 TABLET BY MOUTH EVERY DAY (Patient taking differently: Take 2.5 mg by mouth daily.)   atenolol (TENORMIN) 50 MG tablet Take 1 tablet (50 mg total) by mouth daily.   bictegravir-emtricitabine-tenofovir AF (BIKTARVY) 50-200-25 MG TABS tablet Take 1 tablet by mouth daily.   clopidogrel (PLAVIX) 75 MG tablet Take 1 tablet (75 mg total) by mouth daily.   losartan-hydrochlorothiazide (HYZAAR) 100-25 MG tablet Take 1 tablet by  mouth daily.   Multiple Vitamin (MULTIVITAMIN) tablet Take 1 tablet by mouth daily.   Nintedanib (OFEV) 100 MG CAPS TAKE 1 CAPSULE BY MOUTH TWICE DAILY WITH FOOD.   Oxycodone HCl 10 MG TABS Take 10 mg by mouth as needed (Pain).   pantoprazole (PROTONIX) 40 MG tablet Take 1 tablet (40 mg total) by mouth 2 (two) times daily.   rosuvastatin (CRESTOR) 10 MG tablet Take 1 tablet (10 mg  total) by mouth daily.   Sodium Sulfate-Mag Sulfate-KCl (SUTAB) 920 331 6538 MG TABS Take 24 tablets by mouth as directed.   SYNJARDY XR 12.07-998 MG TB24 Take 2 tablets by mouth daily.   VASCEPA 1 g capsule Take 2 capsules (2 g total) by mouth 2 (two) times daily.   [DISCONTINUED] HYDROcodone bit-homatropine (HYCODAN) 5-1.5 MG/5ML syrup Take 5 mLs by mouth every 6 (six) hours as needed for cough.   [DISCONTINUED] ofloxacin (OCUFLOX) 0.3 % ophthalmic solution Place 1 drop into the right eye 4 (four) times daily.   [DISCONTINUED] prednisoLONE acetate (PRED FORTE) 1 % ophthalmic suspension Place into the right eye.   No facility-administered encounter medications on file as of 04/22/2022.   Allergies  Allergen Reactions   Integrilin [Eptifibatide] Other (See Comments)    Bleeding (non-specific)   Lopressor [Metoprolol Tartrate] Rash and Other (See Comments)    Bleeding (non-specific)    Penicillins Diarrhea and Rash   Patient Active Problem List   Diagnosis Date Noted   Abdominal pain 04/22/2022   Neural foraminal stenosis of cervical spine 01/03/2020   Chronic chest wall pain 11/16/2019   Routine general medical examination at a health care facility 11/16/2019   Hyperlipidemia LDL goal <70 11/15/2019   Benign prostatic hyperplasia without lower urinary tract symptoms 11/15/2019   Interstitial pulmonary disease ([place]) 07/18/2019   Chronic bilateral low back pain without sciatica 03/02/2019   Muscle cramps 03/02/2019   Cirrhosis of liver without ascites ([place]) 01/08/2019   Secondary esophageal varices without bleeding ([place]) 01/08/2019   Intestinal metaplasia of gastric mucosa 01/08/2019   Chronic cough 06/24/2018   Healthcare maintenance 05/11/2018   Hx of colonic polyp 04/27/2018   Change in bowel habit 04/27/2018   Ventral hernia without obstruction or gangrene 04/27/2018   Thrombocytopenia ([place]) 04/27/2018   Hypersplenism 04/27/2018   Coronary artery disease without angina  pectoris 02/25/2018   Obstructive sleep apnea 10/25/2017   Extrinsic asthma without complication 59/56/3875   Vitamin D deficiency 08/25/2017   HIV disease ([place]) 04/15/2017   Primary osteoarthritis of both knees 04/15/2017   Type 2 diabetes with complication ([place]) 64/33/2951   Essential hypertension 04/15/2017   Social History   Socioeconomic History   Marital status: Married    Spouse name: Not on file   Number of children: 1   Years of education: Not on file   Highest education level: 10th grade  Occupational History   Occupation: retired  Tobacco Use   Smoking status: Former    Packs/day: 2.00    Years: 20.00    Total pack years: 40.00    Types: Cigarettes    Start date: 1974    Quit date: 1994    Years since quitting: 30.1    Passive exposure: Past   Smokeless tobacco: Never  Vaping Use   Vaping Use: Never used  Substance and Sexual Activity   Alcohol use: Yes    Comment: social-occ beer   Drug use: No   Sexual activity: Yes  Other Topics Concern   Not on file  Social History Narrative   Not on file   Social Determinants of Health   Financial Resource Strain: Low Risk  (12/22/2021)   Overall Financial Resource Strain (CARDIA)    Difficulty of Paying Living Expenses: Not hard at all  Food Insecurity: No Food Insecurity (12/22/2021)   Hunger Vital Sign    Worried About Running Out of Food in the Last Year: Never true    Ran Out of Food in the Last Year: Never true  Transportation Needs: No Transportation Needs (12/22/2021)   PRAPARE - Hydrologist (Medical): No    Lack of Transportation (Non-Medical): No  Physical Activity: Sufficiently Active (12/22/2021)   Exercise Vital Sign    Days of Exercise per Week: 5 days    Minutes of Exercise per Session: 30 min  Stress: No Stress Concern Present (12/22/2021)   [place]    Feeling of Stress : Not at all  Social  Connections: Moderately Isolated (12/22/2021)   Social Connection and Isolation Panel [NHANES]    Frequency of Communication with Friends and Family: More than three times a week    Frequency of Social Gatherings with Friends and Family: More than three times a week    Attends Religious Services: Never    Marine scientist or Organizations: No    Attends Archivist Meetings: Never    Marital Status: Married  Human resources officer Violence: Not At Risk (12/22/2021)   Humiliation, Afraid, Rape, and Kick questionnaire    Fear of Current or Ex-Partner: No    Emotionally Abused: No    Physically Abused: No    Sexually Abused: No    Mr. Mckillop family history includes Emphysema in his mother; Esophageal cancer in his brother and brother; Heart attack (age of onset: 70) in his father; Lung cancer in his sister.      Objective:    Vitals:   04/22/22 1439  BP: 124/78  Pulse: 64  SpO2: 94%    Physical Exam Well-developed well-nourished older white male in no acute distress.  Height, Weight, 199 BMI 26.9  HEENT; nontraumatic normocephalic, EOMI, PE R LA, sclera anicteric. Oropharynx; not examined today Neck; supple, no JVD Cardiovascular; regular rate and rhythm with X5-M8, systolic murmur Pulmonary; Clear bilaterally Abdomen; soft, nondistended, nontender ,he does have ascites, nontense, abdomen very soft, no palpable mass or hepatosplenomegaly, bowel sounds are active Rectal; not done today Skin; benign exam, no jaundice rash or appreciable lesions Extremities; no clubbing cyanosis or edema skin warm and dry Neuro/Psych; alert and oriented x4, grossly nonfocal mood and affect appropriate , asterixis       Assessment & Plan:   #75 72 year old white male with history of cryptogenic cirrhosis/unspecified cirrhosis, complicated by previously documented grade 1 esophageal varices and portal gastropathy.  He is overdue for [facility] surveillance, and due for follow-up EGD Needs  follow-up labs to calculate current M ELD  #2 recent sense of increased abdominal girth and vague abdominal discomfort/dyspepsia over the past several days Appears to have ascites nontense on exam  #3 history of gastric intestinal metaplasia-due for follow-up EGD #4 history of adenomatous colon polyps-colonoscopy August 2020 with 9 tubular adenomatous polyps removed, overdue for follow-up #5 coronary artery disease status post multiple stents 2020-maintained on chronic Plavix For 5 moderate aortic stenosis-recent echo June 2023 with moderate AAS with aortic valve area of 1.28 cm  #6 sleep apnea with CPAP use no oxygen #7.  Interstitial lung disease #8.  Diabetes mellitus #9.  HIV on Biktarvy #10.  BPH  Plan; CBC, AFP, pro time/INR, venous ammonia Schedule for upper abdominal ultrasound Continue Protonix 40 mg p.o. twice daily Patient will be scheduled for EGD and colonoscopy with Dr. Rush Landmark in the [facility].  Both procedures discussed in detail with the patient today including indications risk and benefits and he is agreeable to proceed. Will need to hold Plavix for 5 days prior to procedures, and we will communicate with his cardiologist/Dr. Irish Lack to assure this is reasonable for this patient.  Further recommendations pending results of above.   Aaryn Parrilla Genia Harold PA-C 04/22/2022   Cc: Hoyt Koch, *

## 2022-04-22 NOTE — Telephone Encounter (Signed)
[place], MD   Preoperative team, please contact this patient and set up a phone call appointment for further preoperative risk assessment. Please obtain consent and complete medication review. Thank you for your help.   OM1 3.0 x 23 mm, left posterolateral 2.5 x 13 mm, diagonal 1 2.5 x 13, ostial diagonal 2.5 mm, proximal LAD- unknown size, mid LAD 3.0 x 13  - all BMS.    Last cath was 2005.  All stents were patent and there was moderate disease in the right PDA and right PLA. Pending no symptoms of ACS, per office protocol  he may hold Plavix for 5 days prior to procedure and should resume as soon as hemodynamically stable postoperatively.   Emmaline Life, NP-C  04/22/2022, 4:34 PM [address], [address] Office [phone] Fax [phone]

## 2022-04-22 NOTE — Telephone Encounter (Signed)
Called and spoke with pt letting him know that the Rx for his cough medication was sent to [facility] for him and he verbalized understanding. Nothing further needed.

## 2022-04-22 NOTE — Progress Notes (Signed)
Attending Physician's Attestation   I have reviewed the chart.   I agree with the Advanced Practitioner's note, impression, and recommendations with any updates as below. Glad to see that he has returned to clinic for follow-up.  Agree with cirrhosis surveillance as has been outlined and arranged.  Endoscopic reevaluation does make sense.  I do agree that he should be an appropriate candidate for the [facility], I will place our anesthesia team on this as well so that it may be reviewed to ensure that he is an appropriate candidate from their standpoint for procedures in the [place].   Justice Britain, MD [facility] Advanced Endoscopy Office # [phone]

## 2022-04-22 NOTE — Telephone Encounter (Signed)
Request for surgical clearance:     Endoscopy Procedure  What type of surgery is being performed?     Colon/EGD   When is this surgery scheduled?     06/24/22  What type of clearance is required ?   Pharmacy  Are there any medications that need to be held prior to surgery and how long? Plavix x5 days prior to procedure  Practice name and name of physician performing surgery?      [facility]-Dr.Mansouraty   What is your office phone and fax number?      Phone- [phone]  [phone]  Anesthesia type (None, local, MAC, general) ?       MAC

## 2022-04-22 NOTE — Patient Instructions (Signed)
Your provider has requested that you go to the basement level for lab work before leaving today. Press "B" on the elevator. The lab is located at the first door on the left as you exit the elevator.  You have been scheduled for an endoscopy and colonoscopy. Please follow the written instructions given to you at your visit today. Please pick up your prep supplies at the pharmacy within the next 1-3 days. If you use inhalers (even only as needed), please bring them with you on the day of your procedure.  We have sent the following medications to your pharmacy for you to pick up at your convenience: Nila Nephew   You will be contaced by our office prior to your procedure for directions on holding your Plavix.  If you do not hear from our office 1 week prior to your scheduled procedure, please call [phone] to discuss.  You have been scheduled for an abdominal ultrasound at [facility] (1st floor of hospital) on 04/29/22 at 9:00am. Please arrive 15 minutes prior to your appointment for registration. Make certain not to have anything to eat or drink 6 hours prior to your appointment. Should you need to reschedule your appointment, please contact radiology at [phone]. This test typically takes about 30 minutes to perform.  _______________________________________________________  If your blood pressure at your visit was 140/90 or greater, please contact your primary care physician to follow up on this.  _______________________________________________________  If you are age 44 or older, your body mass index should be between 23-30. Your Body mass index is 26.99 kg/m. If this is out of the aforementioned range listed, please consider follow up with your Primary Care Provider.  If you are age 53 or younger, your body mass index should be between 19-25. Your Body mass index is 26.99 kg/m. If this is out of the aformentioned range listed, please consider follow up with your Primary Care  Provider.   ________________________________________________________  The [facility] providers would like to encourage you to use [facility] to communicate with providers for non-urgent requests or questions.  Due to long hold times on the telephone, sending your provider a message by [facility] may be a faster and more efficient way to get a response.  Please allow 48 business hours for a response.  Please remember that this is for non-urgent requests.  _______________________________________________________  Thank you for choosing me and [facility].  Amy Esterwood PA-C

## 2022-04-22 NOTE — Telephone Encounter (Signed)
I have sent the order to the new pharmacy

## 2022-04-24 ENCOUNTER — Telehealth: Payer: Self-pay

## 2022-04-24 LAB — AFP TUMOR MARKER: AFP-Tumor Marker: 2.9 ng/mL (ref <6.1)

## 2022-04-24 NOTE — Telephone Encounter (Signed)
  Patient Consent for Virtual Visit         Tyler Hendricks has provided verbal consent on 04/24/2022 for a virtual visit (video or telephone).   CONSENT FOR VIRTUAL VISIT FOR:  Tyler Hendricks  By participating in this virtual visit I agree to the following:  I hereby voluntarily request, consent and authorize [facility] and its employed or contracted physicians, physician assistants, nurse practitioners or other licensed health care professionals (the Practitioner), to provide me with telemedicine health care services (the "Services") as deemed necessary by the treating Practitioner. I acknowledge and consent to receive the Services by the Practitioner via telemedicine. I understand that the telemedicine visit will involve communicating with the Practitioner through live audiovisual communication technology and the disclosure of certain medical information by electronic transmission. I acknowledge that I have been given the opportunity to request an in-person assessment or other available alternative prior to the telemedicine visit and am voluntarily participating in the telemedicine visit.  I understand that I have the right to withhold or withdraw my consent to the use of telemedicine in the course of my care at any time, without affecting my right to future care or treatment, and that the Practitioner or I may terminate the telemedicine visit at any time. I understand that I have the right to inspect all information obtained and/or recorded in the course of the telemedicine visit and may receive copies of available information for a reasonable fee.  I understand that some of the potential risks of receiving the Services via telemedicine include:  Delay or interruption in medical evaluation due to technological equipment failure or disruption; Information transmitted may not be sufficient (e.g. poor resolution of images) to allow for appropriate medical decision making by the Practitioner;  and/or  In rare instances, security protocols could fail, causing a breach of personal health information.  Furthermore, I acknowledge that it is my responsibility to provide information about my medical history, conditions and care that is complete and accurate to the best of my ability. I acknowledge that Practitioner's advice, recommendations, and/or decision may be based on factors not within their control, such as incomplete or inaccurate data provided by me or distortions of diagnostic images or specimens that may result from electronic transmissions. I understand that the practice of medicine is not an exact science and that Practitioner makes no warranties or guarantees regarding treatment outcomes. I acknowledge that a copy of this consent can be made available to me via my patient portal ([facility]), or I can request a printed copy by calling the office of [place].    I understand that my insurance will be billed for this visit.   I have read or had this consent read to me. I understand the contents of this consent, which adequately explains the benefits and risks of the Services being provided via telemedicine.  I have been provided ample opportunity to ask questions regarding this consent and the Services and have had my questions answered to my satisfaction. I give my informed consent for the services to be provided through the use of telemedicine in my medical care

## 2022-04-24 NOTE — Telephone Encounter (Signed)
  Patient Consent for Virtual Visit         Tyler Hendricks has provided verbal consent on 04/24/2022 for a virtual visit (video or telephone).   CONSENT FOR VIRTUAL VISIT FOR:  Tyler Hendricks  By participating in this virtual visit I agree to the following:  I hereby voluntarily request, consent and authorize [place] and its employed or contracted physicians, physician assistants, nurse practitioners or other licensed health care professionals (the Practitioner), to provide me with telemedicine health care services (the "Services") as deemed necessary by the treating Practitioner. I acknowledge and consent to receive the Services by the Practitioner via telemedicine. I understand that the telemedicine visit will involve communicating with the Practitioner through live audiovisual communication technology and the disclosure of certain medical information by electronic transmission. I acknowledge that I have been given the opportunity to request an in-person assessment or other available alternative prior to the telemedicine visit and am voluntarily participating in the telemedicine visit.  I understand that I have the right to withhold or withdraw my consent to the use of telemedicine in the course of my care at any time, without affecting my right to future care or treatment, and that the Practitioner or I may terminate the telemedicine visit at any time. I understand that I have the right to inspect all information obtained and/or recorded in the course of the telemedicine visit and may receive copies of available information for a reasonable fee.  I understand that some of the potential risks of receiving the Services via telemedicine include:  Delay or interruption in medical evaluation due to technological equipment failure or disruption; Information transmitted may not be sufficient (e.g. poor resolution of images) to allow for appropriate medical decision making by the Practitioner;  and/or  In rare instances, security protocols could fail, causing a breach of personal health information.  Furthermore, I acknowledge that it is my responsibility to provide information about my medical history, conditions and care that is complete and accurate to the best of my ability. I acknowledge that Practitioner's advice, recommendations, and/or decision may be based on factors not within their control, such as incomplete or inaccurate data provided by me or distortions of diagnostic images or specimens that may result from electronic transmissions. I understand that the practice of medicine is not an exact science and that Practitioner makes no warranties or guarantees regarding treatment outcomes. I acknowledge that a copy of this consent can be made available to me via my patient portal ([place]), or I can request a printed copy by calling the office of [facility].    I understand that my insurance will be billed for this visit.   I have read or had this consent read to me. I understand the contents of this consent, which adequately explains the benefits and risks of the Services being provided via telemedicine.  I have been provided ample opportunity to ask questions regarding this consent and the Services and have had my questions answered to my satisfaction. I give my informed consent for the services to be provided through the use of telemedicine in my medical care

## 2022-04-24 NOTE — Telephone Encounter (Signed)
Patient scheduled for tele visit on 05/25/22. Med and consent done.

## 2022-04-29 ENCOUNTER — Ambulatory Visit (HOSPITAL_COMMUNITY)
Admission: RE | Admit: 2022-04-29 | Discharge: 2022-04-29 | Disposition: A | Discharge status: Home / Self Care | Payer: Medicare Other | Source: Ambulatory Visit | Attending: Physician Assistant | Admitting: Physician Assistant

## 2022-04-29 DIAGNOSIS — I8500 Esophageal varices without bleeding: Secondary | ICD-10-CM | POA: Diagnosis not present

## 2022-04-29 DIAGNOSIS — Z86010 Personal history of colonic polyps: Secondary | ICD-10-CM | POA: Insufficient documentation

## 2022-04-29 DIAGNOSIS — K31A0 Gastric intestinal metaplasia, unspecified: Secondary | ICD-10-CM | POA: Diagnosis not present

## 2022-04-29 DIAGNOSIS — Z1211 Encounter for screening for malignant neoplasm of colon: Secondary | ICD-10-CM | POA: Diagnosis not present

## 2022-04-29 DIAGNOSIS — K7460 Unspecified cirrhosis of liver: Secondary | ICD-10-CM | POA: Insufficient documentation

## 2022-05-05 NOTE — Progress Notes (Unsigned)
    Subjective:    Patient ID: Tyler Hendricks, male    DOB: 1950/04/23, 72 y.o.   MRN: [id number]      HPI Tyler Hendricks is here for No chief complaint on file.   Trouble going to the bathroom  -     Has BPH   Medications and allergies reviewed with patient and updated if appropriate.  Current Outpatient Medications on File Prior to Visit  Medication Sig Dispense Refill   amLODipine (NORVASC) 2.5 MG tablet Take 1 tablet (2.5 mg total) by mouth daily. TAKE 1 TABLET BY MOUTH EVERY DAY (Patient taking differently: Take 2.5 mg by mouth daily.) 90 tablet 3   atenolol (TENORMIN) 50 MG tablet Take 1 tablet (50 mg total) by mouth daily. 90 tablet 3   bictegravir-emtricitabine-tenofovir AF (BIKTARVY) 50-200-25 MG TABS tablet Take 1 tablet by mouth daily. 30 tablet 11   clopidogrel (PLAVIX) 75 MG tablet Take 1 tablet (75 mg total) by mouth daily. 90 tablet 3   HYDROcodone bit-homatropine (HYCODAN) 5-1.5 MG/5ML syrup Take 5 mLs by mouth every 6 (six) hours as needed for cough. 240 mL 0   losartan-hydrochlorothiazide (HYZAAR) 100-25 MG tablet Take 1 tablet by mouth daily. 90 tablet 3   Multiple Vitamin (MULTIVITAMIN) tablet Take 1 tablet by mouth daily.     Nintedanib (OFEV) 100 MG CAPS TAKE 1 CAPSULE BY MOUTH TWICE DAILY WITH FOOD. 180 capsule 0   Oxycodone HCl 10 MG TABS Take 10 mg by mouth as needed (Pain).     pantoprazole (PROTONIX) 40 MG tablet Take 1 tablet (40 mg total) by mouth 2 (two) times daily. 60 tablet 5   rosuvastatin (CRESTOR) 10 MG tablet Take 1 tablet (10 mg total) by mouth daily. 90 tablet 3   Sodium Sulfate-Mag Sulfate-KCl (SUTAB) [phone] MG TABS Take 24 tablets by mouth as directed. 24 tablet 0   SYNJARDY XR 12.07-998 MG TB24 Take 2 tablets by mouth daily. 180 tablet 3   VASCEPA 1 g capsule Take 2 capsules (2 g total) by mouth 2 (two) times daily. 360 capsule 3   No current facility-administered medications on file prior to visit.    Review of Systems     Objective:   There were no vitals filed for this visit. BP Readings from Last 3 Encounters:  04/22/22 124/78  04/17/22 116/60  01/23/22 (!) 140/72   Wt Readings from Last 3 Encounters:  04/22/22 199 lb (90.3 kg)  04/17/22 198 lb 12.8 oz (90.2 kg)  01/23/22 206 lb (93.4 kg)   There is no height or weight on file to calculate BMI.    Physical Exam         Assessment & Plan:    See Problem List for Assessment and Plan of chronic medical problems.

## 2022-05-06 ENCOUNTER — Encounter: Payer: Self-pay | Admitting: Internal Medicine

## 2022-05-06 ENCOUNTER — Other Ambulatory Visit: Payer: Self-pay

## 2022-05-06 ENCOUNTER — Ambulatory Visit (INDEPENDENT_AMBULATORY_CARE_PROVIDER_SITE_OTHER): Payer: Medicare Other | Admitting: Internal Medicine

## 2022-05-06 VITALS — BP 126/68 | HR 60 | Temp 98.1°F | Ht 72.0 in | Wt 203.0 lb

## 2022-05-06 DIAGNOSIS — R81 Glycosuria: Secondary | ICD-10-CM | POA: Diagnosis not present

## 2022-05-06 DIAGNOSIS — R3129 Other microscopic hematuria: Secondary | ICD-10-CM

## 2022-05-06 DIAGNOSIS — R350 Frequency of micturition: Secondary | ICD-10-CM | POA: Diagnosis not present

## 2022-05-06 DIAGNOSIS — E118 Type 2 diabetes mellitus with unspecified complications: Secondary | ICD-10-CM

## 2022-05-06 DIAGNOSIS — R300 Dysuria: Secondary | ICD-10-CM | POA: Diagnosis not present

## 2022-05-06 DIAGNOSIS — R7989 Other specified abnormal findings of blood chemistry: Secondary | ICD-10-CM

## 2022-05-06 DIAGNOSIS — K7460 Unspecified cirrhosis of liver: Secondary | ICD-10-CM

## 2022-05-06 LAB — URINALYSIS, ROUTINE W REFLEX MICROSCOPIC
Bilirubin Urine: NEGATIVE
Ketones, ur: NEGATIVE
Leukocytes,Ua: NEGATIVE
Nitrite: NEGATIVE
Specific Gravity, Urine: 1.015 (ref 1.000–1.030)
Total Protein, Urine: NEGATIVE
Urine Glucose: >=1000 — AB
Urobilinogen, UA: 1 (ref 0.0–1.0)
pH: 7 (ref 5.0–8.0)

## 2022-05-06 LAB — CBC WITH DIFFERENTIAL/PLATELET
Basophils Absolute: 0.1 10*3/uL (ref 0.0–0.1)
Basophils Relative: 1.3 % (ref 0.0–3.0)
Eosinophils Absolute: 0.3 10*3/uL (ref 0.0–0.7)
Eosinophils Relative: 5 % (ref 0.0–5.0)
HCT: 46.1 % (ref 39.0–52.0)
Hemoglobin: 16 g/dL (ref 13.0–17.0)
Lymphocytes Relative: 36.2 % (ref 12.0–46.0)
Lymphs Abs: 2 10*3/uL (ref 0.7–4.0)
MCHC: 34.6 g/dL (ref 30.0–36.0)
MCV: 100.5 fl — ABNORMAL HIGH (ref 78.0–100.0)
Monocytes Absolute: 0.4 10*3/uL (ref 0.1–1.0)
Monocytes Relative: 7.7 % (ref 3.0–12.0)
Neutro Abs: 2.7 10*3/uL (ref 1.4–7.7)
Neutrophils Relative %: 49.8 % (ref 43.0–77.0)
Platelets: 141 10*3/uL — ABNORMAL LOW (ref 150.0–400.0)
RBC: 4.59 Mil/uL (ref 4.22–5.81)
RDW: 15 % (ref 11.5–15.5)
WBC: 5.4 10*3/uL (ref 4.0–10.5)

## 2022-05-06 LAB — COMPREHENSIVE METABOLIC PANEL
ALT: 23 U/L (ref 0–53)
AST: 31 U/L (ref 0–37)
Albumin: 3.5 g/dL (ref 3.5–5.2)
Alkaline Phosphatase: 106 U/L (ref 39–117)
BUN: 15 mg/dL (ref 6–23)
CO2: 29 mEq/L (ref 19–32)
Calcium: 9.5 mg/dL (ref 8.4–10.5)
Chloride: 103 mEq/L (ref 96–112)
Creatinine, Ser: 0.71 mg/dL (ref 0.40–1.50)
GFR: 92.23 mL/min (ref >60.00)
Glucose, Bld: 118 mg/dL — ABNORMAL HIGH (ref 70–99)
Potassium: 4.1 mEq/L (ref 3.5–5.1)
Sodium: 138 mEq/L (ref 135–145)
Total Bilirubin: 2.9 mg/dL — ABNORMAL HIGH (ref 0.2–1.2)
Total Protein: 6.4 g/dL (ref 6.0–8.3)

## 2022-05-06 LAB — POCT URINALYSIS DIPSTICK
Bilirubin, UA: NEGATIVE
Blood, UA: 200
Glucose, UA: POSITIVE — AB
Ketones, UA: NEGATIVE
Leukocytes, UA: NEGATIVE
Nitrite, UA: NEGATIVE
Protein, UA: POSITIVE — AB
Spec Grav, UA: 1.015 (ref 1.010–1.025)
Urobilinogen, UA: 0.2 E.U./dL
pH, UA: 6.5 (ref 5.0–8.0)

## 2022-05-06 LAB — HEMOGLOBIN A1C: Hgb A1c MFr Bld: 6.5 % (ref 4.6–6.5)

## 2022-05-06 MED ORDER — LACTULOSE 10 GM/15ML PO SOLN: 30.0000 g | Freq: Every day | ORAL | 3 refills | Status: DC

## 2022-05-06 MED ORDER — CIPROFLOXACIN HCL 500 MG PO TABS: 500.0000 mg | ORAL_TABLET | Freq: Two times a day (BID) | ORAL | 0 refills | Status: DC

## 2022-05-06 MED ORDER — RIFAXIMIN 550 MG PO TABS: 550.0000 mg | ORAL_TABLET | Freq: Two times a day (BID) | ORAL | 3 refills | Status: DC

## 2022-05-06 NOTE — Patient Instructions (Addendum)
      Blood work was ordered.   The lab is on the first floor.    Medications changes include :   cipro 500 mg twice a day for 7 days     Return if symptoms worsen or fail to improve.

## 2022-05-09 LAB — CULTURE, URINE COMPREHENSIVE

## 2022-05-09 MED ORDER — NITROFURANTOIN MONOHYD MACRO 100 MG PO CAPS: 100.0000 mg | ORAL_CAPSULE | Freq: Two times a day (BID) | ORAL | 0 refills | Status: DC

## 2022-05-09 NOTE — Addendum Note (Signed)
Addended by: Binnie Rail on: 05/09/2022 03:54 PM   Modules accepted: Orders

## 2022-05-11 ENCOUNTER — Telehealth: Payer: Self-pay | Admitting: Pulmonary Disease

## 2022-05-11 MED ORDER — HYDROCODONE BIT-HOMATROP MBR 5-1.5 MG/5ML PO SOLN: 5.0000 mL | Freq: Four times a day (QID) | ORAL | 0 refills | Status: DC | PRN

## 2022-05-11 NOTE — Telephone Encounter (Signed)
Refill has been sent in.  

## 2022-05-11 NOTE — Telephone Encounter (Signed)
Called and spoke with patient. Patient stated he needs a refill on hycodan.   PM, please advise.

## 2022-05-12 NOTE — Telephone Encounter (Signed)
Called and spoke with patient.  Patient aware prescription sent to requested pharmacy.  Nothing further at this time.

## 2022-05-13 ENCOUNTER — Other Ambulatory Visit (HOSPITAL_COMMUNITY): Payer: Self-pay

## 2022-05-14 ENCOUNTER — Other Ambulatory Visit (HOSPITAL_COMMUNITY): Payer: Self-pay

## 2022-05-21 ENCOUNTER — Other Ambulatory Visit (INDEPENDENT_AMBULATORY_CARE_PROVIDER_SITE_OTHER): Payer: Medicare Other

## 2022-05-21 DIAGNOSIS — K7460 Unspecified cirrhosis of liver: Secondary | ICD-10-CM | POA: Diagnosis not present

## 2022-05-21 DIAGNOSIS — R7989 Other specified abnormal findings of blood chemistry: Secondary | ICD-10-CM

## 2022-05-21 LAB — AMMONIA: Ammonia: 30 umol/L (ref 11–35)

## 2022-05-25 ENCOUNTER — Other Ambulatory Visit: Payer: Self-pay | Admitting: Pulmonary Disease

## 2022-05-25 ENCOUNTER — Ambulatory Visit: Payer: Medicare Other | Attending: Cardiovascular Disease

## 2022-05-25 DIAGNOSIS — J84112 Idiopathic pulmonary fibrosis: Secondary | ICD-10-CM

## 2022-05-25 DIAGNOSIS — Z01810 Encounter for preprocedural cardiovascular examination: Secondary | ICD-10-CM | POA: Diagnosis not present

## 2022-05-25 NOTE — Progress Notes (Signed)
Virtual Visit via Telephone Note   Because of Tyler Hendricks co-morbid illnesses, he is at least at moderate risk for complications without adequate follow up.  This format is felt to be most appropriate for this patient at this time.  The patient did not have access to video technology/had technical difficulties with video requiring transitioning to audio format only (telephone).  All issues noted in this document were discussed and addressed.  No physical exam could be performed with this format.  Please refer to the patient's chart for his consent to telehealth for [facility].  Evaluation Performed:  Preoperative cardiovascular risk assessment _____________   Date:  05/25/2022   Patient ID:  Tyler Hendricks, DOB 10/17/1950, MRN [id number] Patient Location:  Home Provider location:   Office  Primary Care Provider:  Hoyt Koch, MD Primary Cardiologist:  Larae Grooms, MD  Chief Complaint / Patient Profile   72 y.o. y/o male with a h/o coronary artery disease, HTN, type 2 diabetes, aortic valve stenosis, HIV who is pending endoscopy/colonoscopy and presents today for telephonic preoperative cardiovascular risk assessment.  History of Present Illness    Tyler Hendricks is a 72 y.o. male who presents via audio/video conferencing for a telehealth visit today.  Pt was last seen in cardiology clinic on 11/21/2021 by Dr.Varanasi.  At that time Tyler Hendricks was doing well .  The patient is now pending procedure as outlined above. Since his last visit, he he remains stable from a cardiac standpoint.  Today he denies chest pain, shortness of breath, lower extremity edema, fatigue, palpitations, melena, hematuria, hemoptysis, diaphoresis, weakness, presyncope, syncope, orthopnea, and PND.   Past Medical History    Past Medical History:  Diagnosis Date   Abnormal result of cardiovascular function study, unspecified    Atherosclerotic heart disease of native coronary artery  without angina pectoris    CAD (coronary artery disease)    Coronary angioplasty status    Diabetes mellitus without complication ([place])    Essential (primary) hypertension    Gallstones    Hepatic cirrhosis (HCC)    Hepatitis B core antibody positive 01/08/2019   Hepatitis C    HIV infection ([place])    Hyperlipidemia    Hypertension    Internal hemorrhoids    Kidney stones    Lung disease    Mixed hyperlipidemia    Palpitations    Sleep apnea    CPAP   Tubular adenoma of colon    Past Surgical History:  Procedure Laterality Date   CARDIAC CATHETERIZATION Left 04/2013   chlecystectomy     CHOLECYSTECTOMY     COLONOSCOPY  05/02/2013   GAS/FLUID EXCHANGE Right 12/30/2021   Procedure: GAS/FLUID EXCHANGE;  Surgeon: Jalene Mullet, MD;  Location: [place];  Service: Ophthalmology;  Laterality: Right;  SF6   PARS PLANA VITRECTOMY Right 12/30/2021   Procedure: PARS PLANA VITRECTOMY WITH 25 GAUGE;  Surgeon: Jalene Mullet, MD;  Location: [place];  Service: Ophthalmology;  Laterality: Right;   PERCUTANEOUS CORONARY STENT INTERVENTION (PCI-S)     PHOTOCOAGULATION WITH LASER Right 12/30/2021   Procedure: PHOTOCOAGULATION WITH LASER;  Surgeon: Jalene Mullet, MD;  Location: [place];  Service: Ophthalmology;  Laterality: Right;   POLYPECTOMY     UMBILICAL HERNIA REPAIR      Allergies  Allergies  Allergen Reactions   Integrilin [Eptifibatide] Other (See Comments)    Bleeding (non-specific)   Lopressor [Metoprolol Tartrate] Rash and Other (See Comments)    Bleeding (non-specific)    Penicillins  Diarrhea and Rash    Home Medications    Prior to Admission medications   Medication Sig Start Date End Date Taking? Authorizing Provider  amLODipine (NORVASC) 2.5 MG tablet Take 1 tablet (2.5 mg total) by mouth daily. TAKE 1 TABLET BY MOUTH EVERY DAY Patient taking differently: Take 2.5 mg by mouth daily. 09/19/21   Hoyt Koch, MD  atenolol (TENORMIN) 50 MG tablet Take 1 tablet (50  mg total) by mouth daily. 09/19/21   Hoyt Koch, MD  bictegravir-emtricitabine-tenofovir AF (BIKTARVY) 50-200-25 MG TABS tablet Take 1 tablet by mouth daily. 01/02/22 01/02/23  Golden Circle, FNP  clopidogrel (PLAVIX) 75 MG tablet Take 1 tablet (75 mg total) by mouth daily. 09/19/21   Hoyt Koch, MD  HYDROcodone bit-homatropine (HYCODAN) 5-1.5 MG/5ML syrup Take 5 mLs by mouth every 6 (six) hours as needed for cough. 05/11/22   Mannam, Hart Robinsons, MD  lactulose (CHRONULAC) 10 GM/15ML solution Take 45 mLs (30 g total) by mouth daily. 05/06/22   Esterwood, Amy S, PA-C  losartan-hydrochlorothiazide (HYZAAR) 100-25 MG tablet Take 1 tablet by mouth daily. 09/19/21   Hoyt Koch, MD  Multiple Vitamin (MULTIVITAMIN) tablet Take 1 tablet by mouth daily.    [provider]  Nintedanib (OFEV) 100 MG CAPS TAKE 1 CAPSULE BY MOUTH TWICE DAILY WITH FOOD. 03/02/22   Mannam, Hart Robinsons, MD  nitrofurantoin, macrocrystal-monohydrate, (MACROBID) 100 MG capsule Take 1 capsule (100 mg total) by mouth 2 (two) times daily. 05/09/22   Binnie Rail, MD  Oxycodone HCl 10 MG TABS Take 10 mg by mouth as needed (Pain). 10/30/20   [provider]  pantoprazole (PROTONIX) 40 MG tablet Take 1 tablet (40 mg total) by mouth 2 (two) times daily. 04/17/22   Mannam, Hart Robinsons, MD  rifaximin (XIFAXAN) 550 MG TABS tablet Take 1 tablet (550 mg total) by mouth 2 (two) times daily. 05/06/22   Esterwood, Amy S, PA-C  rosuvastatin (CRESTOR) 10 MG tablet Take 1 tablet (10 mg total) by mouth daily. 09/19/21   Hoyt Koch, MD  Sodium Sulfate-Mag Sulfate-KCl (SUTAB) [phone] MG TABS Take 24 tablets by mouth as directed. 04/22/22   Esterwood, Amy S, PA-C  SYNJARDY XR 12.07-998 MG TB24 Take 2 tablets by mouth daily. 09/19/21   Hoyt Koch, MD  VASCEPA 1 g capsule Take 2 capsules (2 g total) by mouth 2 (two) times daily. 09/19/21   Hoyt Koch, MD    Physical Exam    Vital Signs:   Tyler Hendricks does not have vital signs available for review today.  Given telephonic nature of communication, physical exam is limited. AAOx3. NAD. Normal affect.  Speech and respirations are unlabored.  Accessory Clinical Findings    None  Assessment & Plan    1.  Preoperative Cardiovascular Risk Assessment: Colonoscopy/endoscopy, [facility], Dr. Rush Landmark  The patient was advised that if he develops new symptoms prior to surgery to contact our office to arrange for a follow-up visit, and he verbalized understanding.      Primary Cardiologist: Larae Grooms, MD  Chart reviewed as part of pre-operative protocol coverage. Given past medical history and time since last visit, based on ACC/AHA guidelines, Tyler Hendricks would be at acceptable risk for the planned procedure without further cardiovascular testing.   His Plavix may be held for 5 days prior to his procedure.  Please resume as soon as hemostasis is achieved postoperatively.  Patient was advised that if he develops new symptoms prior to surgery to  contact our office to arrange a follow-up appointment.  He verbalized understanding.  I will route this recommendation to the requesting party via Epic fax function and remove from pre-op pool.      A copy of this note will be routed to requesting surgeon.  Time:   Today, I have spent 5 minutes with the patient with telehealth technology discussing medical history, symptoms, and management plan.  Prior to his phone evaluation I spent greater than 10 minutes reviewing his past medical history and cardiac medications.   Deberah Pelton, NP  05/25/2022, 7:01 AM

## 2022-05-26 NOTE — Telephone Encounter (Signed)
Per cardiology- patient can hold plavix 5 days prior to procedure. Patient has been informed and voiced understanding.

## 2022-05-27 ENCOUNTER — Telehealth: Payer: Self-pay | Admitting: Pulmonary Disease

## 2022-05-27 NOTE — Telephone Encounter (Signed)
Called and spoke with patient. Patient stated that he needed a refill on the hydrocodone.   PM, please advise.

## 2022-05-27 NOTE — Telephone Encounter (Signed)
HYDROcodone bit-homatropine (HYCODAN) 5-1.5 MG/5ML syrup   Please call in a refill.  [facility]arm is [facility] [address].   Adv Dr. Has to sign off on RX so be patient.

## 2022-05-28 MED ORDER — HYDROCODONE BIT-HOMATROP MBR 5-1.5 MG/5ML PO SOLN: 5.0000 mL | Freq: Four times a day (QID) | ORAL | 0 refills | Status: DC | PRN

## 2022-05-28 NOTE — Telephone Encounter (Signed)
Patient checking on message for cough syrup. Patient phone number is [phone].

## 2022-05-28 NOTE — Telephone Encounter (Signed)
Called the pt and made him aware that we will let him know once his refill on hycodan has been sent    Dr Vaughan Browner, please advise once you have sent this and I will call pt back, thanks!

## 2022-05-28 NOTE — Telephone Encounter (Signed)
Done. Medication has been filled

## 2022-05-29 NOTE — Telephone Encounter (Signed)
Called and spoke with pt letting him know that Dr. Vaughan Browner sent Rx for hycodan to pharmacy for him and he verbalized understanding stating that he picked it up from pharmacy yesterday 3/7. Nothing further needed.

## 2022-06-05 ENCOUNTER — Telehealth: Payer: Self-pay | Admitting: Pulmonary Disease

## 2022-06-05 DIAGNOSIS — H35453 Secondary pigmentary degeneration, bilateral: Secondary | ICD-10-CM | POA: Diagnosis not present

## 2022-06-05 DIAGNOSIS — H353121 Nonexudative age-related macular degeneration, left eye, early dry stage: Secondary | ICD-10-CM | POA: Diagnosis not present

## 2022-06-05 DIAGNOSIS — H35371 Puckering of macula, right eye: Secondary | ICD-10-CM | POA: Diagnosis not present

## 2022-06-05 DIAGNOSIS — H35363 Drusen (degenerative) of macula, bilateral: Secondary | ICD-10-CM | POA: Diagnosis not present

## 2022-06-05 DIAGNOSIS — H5319 Other subjective visual disturbances: Secondary | ICD-10-CM | POA: Diagnosis not present

## 2022-06-05 DIAGNOSIS — H43392 Other vitreous opacities, left eye: Secondary | ICD-10-CM | POA: Diagnosis not present

## 2022-06-05 DIAGNOSIS — Z961 Presence of intraocular lens: Secondary | ICD-10-CM | POA: Diagnosis not present

## 2022-06-05 DIAGNOSIS — H353112 Nonexudative age-related macular degeneration, right eye, intermediate dry stage: Secondary | ICD-10-CM | POA: Diagnosis not present

## 2022-06-05 DIAGNOSIS — H25812 Combined forms of age-related cataract, left eye: Secondary | ICD-10-CM | POA: Diagnosis not present

## 2022-06-05 NOTE — Telephone Encounter (Signed)
Spoke with the pt  He states that he went to take his hycodan this morning and dropped it and it spilled everywhere  He is asking for more to be called in for him ([facility] [address]) He just had #240 ml filled on 05/28/22  Please advise, thanks!

## 2022-06-05 NOTE — Telephone Encounter (Signed)
Patient states dropped bottle of cough syrup and it spilled out. Would like some more called into pharmacy. Pharmacy is [facility] [address] [facility]. Patient phone number is [phone].

## 2022-06-05 NOTE — Telephone Encounter (Signed)
Patient checking on message for cough syrup. Patient phone number is [phone]. 

## 2022-06-09 ENCOUNTER — Other Ambulatory Visit (HOSPITAL_COMMUNITY): Payer: Self-pay

## 2022-06-09 NOTE — Telephone Encounter (Signed)
Pt called back about needing a refill of his cough medication.  Pharmacy that this needs to go to is [facility] at [address].  Dr. Vaughan Browner, please advise.

## 2022-06-10 MED ORDER — HYDROCODONE BIT-HOMATROP MBR 5-1.5 MG/5ML PO SOLN: 5.0000 mL | Freq: Four times a day (QID) | ORAL | 0 refills | Status: DC | PRN

## 2022-06-10 NOTE — Telephone Encounter (Signed)
Refill has been sent.  °

## 2022-06-10 NOTE — Telephone Encounter (Signed)
Advised patient on cough syrup refill. Nothing further needed.

## 2022-06-10 NOTE — Telephone Encounter (Signed)
PT calling again for his RX. Adv give Dr. Time to see and sign.

## 2022-06-10 NOTE — Telephone Encounter (Signed)
Patient calling again asking for refill of Hycodan cough syrup. Patient advises he spilled the bottle in the floor and is out now.  Dr. Vaughan Browner can you please refill  Ph[facility]acy [facility] on [address] and [address]

## 2022-06-15 DIAGNOSIS — M5416 Radiculopathy, lumbar region: Secondary | ICD-10-CM | POA: Diagnosis not present

## 2022-06-15 DIAGNOSIS — M48062 Spinal stenosis, lumbar region with neurogenic claudication: Secondary | ICD-10-CM | POA: Diagnosis not present

## 2022-06-15 DIAGNOSIS — M5136 Other intervertebral disc degeneration, lumbar region: Secondary | ICD-10-CM | POA: Diagnosis not present

## 2022-06-16 ENCOUNTER — Other Ambulatory Visit: Payer: Self-pay

## 2022-06-16 DIAGNOSIS — H43392 Other vitreous opacities, left eye: Secondary | ICD-10-CM | POA: Diagnosis not present

## 2022-06-23 ENCOUNTER — Telehealth: Payer: Self-pay | Admitting: Pulmonary Disease

## 2022-06-23 NOTE — Telephone Encounter (Signed)
Dr. Vaughan Browner patient is requesting refill of Hycodan cough syrup. It was just filled on 3/20. Please advise if your willing to fill prescription

## 2022-06-23 NOTE — Telephone Encounter (Signed)
Patient calling again asking for refill of Hycodan cough syrup.   Pharmacy [facility] on [address]. T[place]

## 2022-06-24 ENCOUNTER — Ambulatory Visit (AMBULATORY_SURGERY_CENTER): Payer: Medicare Other | Admitting: Gastroenterology

## 2022-06-24 ENCOUNTER — Encounter: Payer: Self-pay | Admitting: Gastroenterology

## 2022-06-24 VITALS — BP 146/83 | HR 79 | Temp 98.4°F | Resp 16 | Ht 72.0 in | Wt 199.0 lb

## 2022-06-24 DIAGNOSIS — Z1211 Encounter for screening for malignant neoplasm of colon: Secondary | ICD-10-CM

## 2022-06-24 DIAGNOSIS — I8510 Secondary esophageal varices without bleeding: Secondary | ICD-10-CM

## 2022-06-24 DIAGNOSIS — K319 Disease of stomach and duodenum, unspecified: Secondary | ICD-10-CM | POA: Diagnosis not present

## 2022-06-24 DIAGNOSIS — K635 Polyp of colon: Secondary | ICD-10-CM | POA: Diagnosis not present

## 2022-06-24 DIAGNOSIS — Z86010 Personal history of colonic polyps: Secondary | ICD-10-CM

## 2022-06-24 DIAGNOSIS — K7460 Unspecified cirrhosis of liver: Secondary | ICD-10-CM

## 2022-06-24 DIAGNOSIS — D123 Benign neoplasm of transverse colon: Secondary | ICD-10-CM | POA: Diagnosis not present

## 2022-06-24 DIAGNOSIS — K2950 Unspecified chronic gastritis without bleeding: Secondary | ICD-10-CM | POA: Diagnosis not present

## 2022-06-24 DIAGNOSIS — I8500 Esophageal varices without bleeding: Secondary | ICD-10-CM

## 2022-06-24 DIAGNOSIS — I2510 Atherosclerotic heart disease of native coronary artery without angina pectoris: Secondary | ICD-10-CM | POA: Diagnosis not present

## 2022-06-24 DIAGNOSIS — I10 Essential (primary) hypertension: Secondary | ICD-10-CM | POA: Diagnosis not present

## 2022-06-24 DIAGNOSIS — K2970 Gastritis, unspecified, without bleeding: Secondary | ICD-10-CM

## 2022-06-24 DIAGNOSIS — G4733 Obstructive sleep apnea (adult) (pediatric): Secondary | ICD-10-CM | POA: Diagnosis not present

## 2022-06-24 DIAGNOSIS — Z09 Encounter for follow-up examination after completed treatment for conditions other than malignant neoplasm: Secondary | ICD-10-CM | POA: Diagnosis not present

## 2022-06-24 DIAGNOSIS — K229 Disease of esophagus, unspecified: Secondary | ICD-10-CM

## 2022-06-24 MED ORDER — SODIUM CHLORIDE 0.9 % IV SOLN: 500.0000 mL | INTRAVENOUS | Status: DC

## 2022-06-24 MED ORDER — ESOMEPRAZOLE MAGNESIUM 40 MG PO CPDR
DELAYED_RELEASE_CAPSULE | ORAL | 0 refills | Status: DC
Start: 2022-06-24 — End: 2022-09-14

## 2022-06-24 NOTE — Progress Notes (Signed)
GASTROENTEROLOGY PROCEDURE H&P NOTE   Primary Care Physician: Hoyt Koch, MD  HPI: Tyler Hendricks is a 72 y.o. male who presents for EGD/Colonoscopy for followup of Cirrhosis/Varices/Intestinal Metaplasia and Surveillance of Colon polyps.  Past Medical History:  Diagnosis Date   Abnormal result of cardiovascular function study, unspecified    Atherosclerotic heart disease of native coronary artery without angina pectoris    CAD (coronary artery disease)    Coronary angioplasty status    Diabetes mellitus without complication    Essential (primary) hypertension    Gallstones    Hepatic cirrhosis    Hepatitis B core antibody positive 01/08/2019   Hepatitis C    HIV infection    Hyperlipidemia    Hypertension    Internal hemorrhoids    Kidney stones    Lung disease    Mixed hyperlipidemia    Palpitations    Sleep apnea    CPAP   Tubular adenoma of colon    Past Surgical History:  Procedure Laterality Date   CARDIAC CATHETERIZATION Left 04/2013   chlecystectomy     CHOLECYSTECTOMY     COLONOSCOPY  05/02/2013   GAS/FLUID EXCHANGE Right 12/30/2021   Procedure: GAS/FLUID EXCHANGE;  Surgeon: Jalene Mullet, MD;  Location: [facility];  Service: Ophthalmology;  Laterality: Right;  SF6   PARS PLANA VITRECTOMY Right 12/30/2021   Procedure: PARS PLANA VITRECTOMY WITH 25 GAUGE;  Surgeon: Jalene Mullet, MD;  Location: [facility];  Service: Ophthalmology;  Laterality: Right;   PERCUTANEOUS CORONARY STENT INTERVENTION (PCI-S)     PHOTOCOAGULATION WITH LASER Right 12/30/2021   Procedure: PHOTOCOAGULATION WITH LASER;  Surgeon: Jalene Mullet, MD;  Location: [facility];  Service: Ophthalmology;  Laterality: Right;   POLYPECTOMY     UMBILICAL HERNIA REPAIR     Current Outpatient Medications  Medication Sig Dispense Refill   amLODipine (NORVASC) 2.5 MG tablet Take 1 tablet (2.5 mg total) by mouth daily. TAKE 1 TABLET BY MOUTH EVERY DAY (Patient taking differently: Take 2.5 mg by mouth  daily.) 90 tablet 3   atenolol (TENORMIN) 50 MG tablet Take 1 tablet (50 mg total) by mouth daily. 90 tablet 3   bictegravir-emtricitabine-tenofovir AF (BIKTARVY) 50-200-25 MG TABS tablet Take 1 tablet by mouth daily. 30 tablet 11   lactulose (CHRONULAC) 10 GM/15ML solution Take 45 mLs (30 g total) by mouth daily. 1350 mL 3   Multiple Vitamin (MULTIVITAMIN) tablet Take 1 tablet by mouth daily.     nitrofurantoin, macrocrystal-monohydrate, (MACROBID) 100 MG capsule Take 1 capsule (100 mg total) by mouth 2 (two) times daily. 14 capsule 0   OFEV 100 MG CAPS TAKE 1 CAPSULE BY MOUTH 2 TIMES A DAY WITH FOOD. TAKE 12 HOURS APART. 180 capsule 0   Oxycodone HCl 10 MG TABS Take 10 mg by mouth as needed (Pain).     pantoprazole (PROTONIX) 40 MG tablet Take 1 tablet (40 mg total) by mouth 2 (two) times daily. 60 tablet 5   rifaximin (XIFAXAN) 550 MG TABS tablet Take 1 tablet (550 mg total) by mouth 2 (two) times daily. 60 tablet 3   clopidogrel (PLAVIX) 75 MG tablet Take 1 tablet (75 mg total) by mouth daily. 90 tablet 3   HYDROcodone bit-homatropine (HYCODAN) 5-1.5 MG/5ML syrup Take 5 mLs by mouth every 6 (six) hours as needed for cough. 240 mL 0   losartan-hydrochlorothiazide (HYZAAR) 100-25 MG tablet Take 1 tablet by mouth daily. 90 tablet 3   rosuvastatin (CRESTOR) 10 MG tablet Take 1 tablet (10  mg total) by mouth daily. 90 tablet 3   SYNJARDY XR 12.07-998 MG TB24 Take 2 tablets by mouth daily. 180 tablet 3   VASCEPA 1 g capsule Take 2 capsules (2 g total) by mouth 2 (two) times daily. 360 capsule 3   Current Facility-Administered Medications  Medication Dose Route Frequency Provider Last Rate Last Admin   0.9 %  sodium chloride infusion  500 mL Intravenous Continuous Mansouraty, Telford Nab., MD        Current Outpatient Medications:    amLODipine (NORVASC) 2.5 MG tablet, Take 1 tablet (2.5 mg total) by mouth daily. TAKE 1 TABLET BY MOUTH EVERY DAY (Patient taking differently: Take 2.5 mg by mouth  daily.), Disp: 90 tablet, Rfl: 3   atenolol (TENORMIN) 50 MG tablet, Take 1 tablet (50 mg total) by mouth daily., Disp: 90 tablet, Rfl: 3   bictegravir-emtricitabine-tenofovir AF (BIKTARVY) 50-200-25 MG TABS tablet, Take 1 tablet by mouth daily., Disp: 30 tablet, Rfl: 11   lactulose (CHRONULAC) 10 GM/15ML solution, Take 45 mLs (30 g total) by mouth daily., Disp: 1350 mL, Rfl: 3   Multiple Vitamin (MULTIVITAMIN) tablet, Take 1 tablet by mouth daily., Disp: , Rfl:    nitrofurantoin, macrocrystal-monohydrate, (MACROBID) 100 MG capsule, Take 1 capsule (100 mg total) by mouth 2 (two) times daily., Disp: 14 capsule, Rfl: 0   OFEV 100 MG CAPS, TAKE 1 CAPSULE BY MOUTH 2 TIMES A DAY WITH FOOD. TAKE 12 HOURS APART., Disp: 180 capsule, Rfl: 0   Oxycodone HCl 10 MG TABS, Take 10 mg by mouth as needed (Pain)., Disp: , Rfl:    pantoprazole (PROTONIX) 40 MG tablet, Take 1 tablet (40 mg total) by mouth 2 (two) times daily., Disp: 60 tablet, Rfl: 5   rifaximin (XIFAXAN) 550 MG TABS tablet, Take 1 tablet (550 mg total) by mouth 2 (two) times daily., Disp: 60 tablet, Rfl: 3   clopidogrel (PLAVIX) 75 MG tablet, Take 1 tablet (75 mg total) by mouth daily., Disp: 90 tablet, Rfl: 3   HYDROcodone bit-homatropine (HYCODAN) 5-1.5 MG/5ML syrup, Take 5 mLs by mouth every 6 (six) hours as needed for cough., Disp: 240 mL, Rfl: 0   losartan-hydrochlorothiazide (HYZAAR) 100-25 MG tablet, Take 1 tablet by mouth daily., Disp: 90 tablet, Rfl: 3   rosuvastatin (CRESTOR) 10 MG tablet, Take 1 tablet (10 mg total) by mouth daily., Disp: 90 tablet, Rfl: 3   SYNJARDY XR 12.07-998 MG TB24, Take 2 tablets by mouth daily., Disp: 180 tablet, Rfl: 3   VASCEPA 1 g capsule, Take 2 capsules (2 g total) by mouth 2 (two) times daily., Disp: 360 capsule, Rfl: 3  Current Facility-Administered Medications:    0.9 %  sodium chloride infusion, 500 mL, Intravenous, Continuous, Mansouraty, Telford Nab., MD Allergies  Allergen Reactions   Integrilin  [Eptifibatide] Other (See Comments)    Bleeding (non-specific)   Lopressor [Metoprolol Tartrate] Rash and Other (See Comments)    Bleeding (non-specific)    Penicillins Diarrhea and Rash   Family History  Problem Relation Age of Onset   Emphysema Mother        pulmonary   Heart attack Father 66   Lung cancer Sister    Esophageal cancer Brother    Esophageal cancer Brother    Colon cancer Neg Hx    Inflammatory bowel disease Neg Hx    Liver disease Neg Hx    Pancreatic cancer Neg Hx    Rectal cancer Neg Hx    Stomach cancer Neg Hx  Colon polyps Neg Hx    Sleep apnea Neg Hx    Social History   Socioeconomic History   Marital status: Married    Spouse name: Not on file   Number of children: 1   Years of education: Not on file   Highest education level: 10th grade  Occupational History   Occupation: retired  Tobacco Use   Smoking status: Former    Packs/day: 2.00    Years: 20.00    Additional pack years: 0.00    Total pack years: 40.00    Types: Cigarettes    Start date: 40    Quit date: 1994    Years since quitting: 30.2    Passive exposure: Past   Smokeless tobacco: Never  Vaping Use   Vaping Use: Never used  Substance and Sexual Activity   Alcohol use: Yes    Comment: social-occ beer   Drug use: No   Sexual activity: Yes  Other Topics Concern   Not on file  Social History Narrative   Not on file   Social Determinants of Health   Financial Resource Strain: Low Risk  (12/22/2021)   Overall Financial Resource Strain (CARDIA)    Difficulty of Paying Living Expenses: Not hard at all  Food Insecurity: No Food Insecurity (12/22/2021)   Hunger Vital Sign    Worried About Running Out of Food in the Last Year: Never true    [place] in the Last Year: Never true  Transportation Needs: No Transportation Needs (12/22/2021)   PRAPARE - Hydrologist (Medical): No    Lack of Transportation (Non-Medical): No  Physical Activity:  Sufficiently Active (12/22/2021)   Exercise Vital Sign    Days of Exercise per Week: 5 days    Minutes of Exercise per Session: 30 min  Stress: No Stress Concern Present (12/22/2021)   [place]    Feeling of Stress : Not at all  Social Connections: Moderately Isolated (12/22/2021)   Social Connection and Isolation Panel [NHANES]    Frequency of Communication with Friends and Family: More than three times a week    Frequency of Social Gatherings with Friends and Family: More than three times a week    Attends Religious Services: Never    Marine scientist or Organizations: No    Attends Archivist Meetings: Never    Marital Status: Married  Human resources officer Violence: Not At Risk (12/22/2021)   Humiliation, Afraid, Rape, and Kick questionnaire    Fear of Current or Ex-Partner: No    Emotionally Abused: No    Physically Abused: No    Sexually Abused: No    Physical Exam: Today's Vitals   06/24/22 1031  BP: (!) 157/93  Pulse: 80  Temp: 98.4 F (36.9 C)  SpO2: 94%  Weight: 199 lb (90.3 kg)  Height: 6' (1.829 m)   Body mass index is 26.99 kg/m. GEN: NAD EYE: Sclerae anicteric ENT: MMM CV: Non-tachycardic GI: Soft, NT/ND NEURO:  Alert & Oriented x 3  Lab Results: No results for input(s): "WBC", "HGB", "HCT", "PLT" in the last 72 hours. BMET No results for input(s): "NA", "K", "CL", "CO2", "GLUCOSE", "BUN", "CREATININE", "CALCIUM" in the last 72 hours. LFT No results for input(s): "PROT", "ALBUMIN", "AST", "ALT", "ALKPHOS", "BILITOT", "BILIDIR", "IBILI" in the last 72 hours. PT/INR No results for input(s): "LABPROT", "INR" in the last 72 hours.   Impression / Plan: This  is a 72 y.o.male  who presents for EGD/Colonoscopy for followup of Cirrhosis/Varices/Intestinal Metaplasia and Surveillance of Colon polyps.  The risks and benefits of endoscopic evaluation/treatment were discussed with the  patient and/or family; these include but are not limited to the risk of perforation, infection, bleeding, missed lesions, lack of diagnosis, severe illness requiring hospitalization, as well as anesthesia and sedation related illnesses.  The patient's history has been reviewed, patient examined, no change in status, and deemed stable for procedure.  The patient and/or family is agreeable to proceed.    Justice Britain, MD [facility] Office # [id number]

## 2022-06-24 NOTE — Patient Instructions (Addendum)
Handouts on gastritis, hemorrhoids, polyps, and high-fiber diet given to patient Await pathology results High-fiber diet recommended, also use FiberCon 1-2 tablets daily to help increase fiber intake  Initiate Nexium 40 mg twice a day for 2 months then decrease to once daily - sent to [facility] choice  Continue atenolol at current dosing  Repeat upper endoscopy in 3 years for surveillance of varices and likely intestinal metaplasia (consider sooner follow-up based on overall gastric intestinal metaplasia findings) Repeat colonoscopy in 5-7 years for surveillance based off of pathology results May restart Plavix on 4/5 (48 hours to decrease post interventional bleeding risk)   YOU HAD AN ENDOSCOPIC PROCEDURE TODAY AT [facility]:   Refer to the procedure report that was given to you for any specific questions about what was found during the examination.  If the procedure report does not answer your questions, please call your gastroenterologist to clarify.  If you requested that your care partner not be given the details of your procedure findings, then the procedure report has been included in a sealed envelope for you to review at your convenience later.  YOU SHOULD EXPECT: Some feelings of bloating in the abdomen. Passage of more gas than usual.  Walking can help get rid of the air that was put into your GI tract during the procedure and reduce the bloating. If you had a lower endoscopy (such as a colonoscopy or flexible sigmoidoscopy) you may notice spotting of blood in your stool or on the toilet paper. If you underwent a bowel prep for your procedure, you may not have a normal bowel movement for a few days.  Please Note:  You might notice some irritation and congestion in your nose or some drainage.  This is from the oxygen used during your procedure.  There is no need for concern and it should clear up in a day or so.  SYMPTOMS TO REPORT IMMEDIATELY:  Following lower  endoscopy (colonoscopy or flexible sigmoidoscopy):  Excessive amounts of blood in the stool  Significant tenderness or worsening of abdominal pains  Swelling of the abdomen that is new, acute  Fever of 100F or higher  Following upper endoscopy (EGD)  Vomiting of blood or coffee ground material  New chest pain or pain under the shoulder blades  Painful or persistently difficult swallowing  New shortness of breath  Fever of 100F or higher  Black, tarry-looking stools  For urgent or emergent issues, a gastroenterologist can be reached at any hour by calling [phone]. Do not use MyChart messaging for urgent concerns.    DIET:  We do recommend a small meal at first, but then you may proceed to your regular diet.  Drink plenty of fluids but you should avoid alcoholic beverages for 24 hours.  ACTIVITY:  You should plan to take it easy for the rest of today and you should NOT DRIVE or use heavy machinery until tomorrow (because of the sedation medicines used during the test).    FOLLOW UP: Our staff will call the number listed on your records the next business day following your procedure.  We will call around 7:15- 8:00 am to check on you and address any questions or concerns that you may have regarding the information given to you following your procedure. If we do not reach you, we will leave a message.     If any biopsies were taken you will be contacted by phone or by letter within the next 1-3 weeks.  Please call us at [phone] if you have not heard about the biopsies in 3 weeks.    SIGNATURES/CONFIDENTIALITY: You and/or your care partner have signed paperwork which will be entered into your electronic medical record.  These signatures attest to the fact that that the information above on your After Visit Summary has been reviewed and is understood.  Full responsibility of the confidentiality of this discharge information lies with you and/or your care-partner.

## 2022-06-24 NOTE — Op Note (Addendum)
[facility] Patient Name: Tyler Hendricks Procedure Date: 06/24/2022 10:52 AM MRN: [id number] Endoscopist: Justice Britain , MD, [id number] Age: 72 Referring MD:  Date of Birth: 1951/03/09 Gender: Male Account #: [id number] Procedure:                Colonoscopy Indications:              Surveillance: Personal history of adenomatous                            polyps on last colonoscopy > 3 years ago Medicines:                Monitored Anesthesia Care Procedure:                Pre-Anesthesia Assessment:                           - Prior to the procedure, a History and Physical                            was performed, and patient medications and                            allergies were reviewed. The patient's tolerance of                            previous anesthesia was also reviewed. The risks                            and benefits of the procedure and the sedation                            options and risks were discussed with the patient.                            All questions were answered, and informed consent                            was obtained. Prior Anticoagulants: The patient has                            taken Plavix (clopidogrel), last dose was 5 days                            prior to procedure. ASA Grade Assessment: III - A                            patient with severe systemic disease. After                            reviewing the risks and benefits, the patient was                            deemed in satisfactory condition to undergo the  procedure.                           After obtaining informed consent, the colonoscope                            was passed under direct vision. Throughout the                            procedure, the patient's blood pressure, pulse, and                            oxygen saturations were monitored continuously. The                            Olympus CF-HQ190L [id number]) Colonoscope was                             introduced through the anus and advanced to the the                            cecum, identified by appendiceal orifice and                            ileocecal valve. The colonoscopy was performed                            without difficulty. The patient tolerated the                            procedure. The quality of the bowel preparation was                            adequate. The terminal ileum, ileocecal valve,                            appendiceal orifice, and rectum were photographed. Scope In: 11:13:55 AM Scope Out: 11:30:53 AM Scope Withdrawal Time: 0 hours 15 minutes 52 seconds  Total Procedure Duration: 0 hours 16 minutes 58 seconds  Findings:                 The digital rectal exam findings include                            hemorrhoids. Pertinent negatives include no                            palpable rectal lesions.                           The terminal ileum and ileocecal valve appeared                            normal.  Two sessile polyps were found in the splenic                            flexure and transverse colon. The polyps were 3 to                            5 mm in size. These polyps were removed with a cold                            snare. Resection and retrieval were complete.                           A segmental area of granular mucosa was found in                            the entire colon. Biopsies were taken with a cold                            forceps for histology.                           Non-bleeding non-thrombosed external and internal                            hemorrhoids were found during retroflexion, during                            perianal exam and during digital exam. The                            hemorrhoids were Grade II (internal hemorrhoids                            that prolapse but reduce spontaneously). Complications:            No immediate complications. Estimated  Blood Loss:     Estimated blood loss was minimal. Impression:               - Hemorrhoids found on digital rectal exam.                           - The examined portion of the ileum was normal.                           - Two 3 to 5 mm polyps at the splenic flexure and                            in the transverse colon, removed with a cold snare.                            Resected and retrieved.                           - Granularity in the entire examined  colon.                            Biopsied.                           - Non-bleeding non-thrombosed external and internal                            hemorrhoids.                           - The GI Genius (intelligent endoscopy module),                            computer-aided polyp detection system powered by AI                            was utilized to detect colorectal polyps through                            enhanced visualization during colonoscopy. Recommendation:           - The patient will be observed post-procedure,                            until all discharge criteria are met.                           - Discharge patient to home.                           - Patient has a contact number available for                            emergencies. The signs and symptoms of potential                            delayed complications were discussed with the                            patient. Return to normal activities tomorrow.                            Written discharge instructions were provided to the                            patient.                           - High fiber diet.                           - Use FiberCon 1-2 tablets PO daily.                           - Continue present medications.                           -  Await pathology results.                           - Repeat colonoscopy in 5-7 years for surveillance                            based on pathology results.                           - The findings  and recommendations were discussed                            with the patient.                           - The findings and recommendations were discussed                            with the patient's family.                           - May restart Plavix on 4/5 (48 hours to decrease                            post interventional bleeding risk). Justice Britain, MD 06/24/2022 11:45:14 AM

## 2022-06-24 NOTE — Op Note (Addendum)
[place] Patient Name: Tyler Hendricks Procedure Date: 06/24/2022 10:52 AM MRN: [id number] Endoscopist: Justice Britain , MD, [id number] Age: 72 Referring MD:  Date of Birth: 02-02-1951 Gender: Male Account #: [id number] Procedure:                Upper GI endoscopy Indications:              Follow-up of esophageal varices, Follow-up of                            intestinal metaplasia Medicines:                Monitored Anesthesia Care Procedure:                Pre-Anesthesia Assessment:                           - Prior to the procedure, a History and Physical                            was performed, and patient medications and                            allergies were reviewed. The patient's tolerance of                            previous anesthesia was also reviewed. The risks                            and benefits of the procedure and the sedation                            options and risks were discussed with the patient.                            All questions were answered, and informed consent                            was obtained. Prior Anticoagulants: The patient has                            taken Plavix (clopidogrel), last dose was 5 days                            prior to procedure. ASA Grade Assessment: III - A                            patient with severe systemic disease. After                            reviewing the risks and benefits, the patient was                            deemed in satisfactory condition to undergo the  procedure.                           After obtaining informed consent, the endoscope was                            passed under direct vision. Throughout the                            procedure, the patient's blood pressure, pulse, and                            oxygen saturations were monitored continuously. The                            GIF [id number] was introduced through the                             mouth, and advanced to the second part of duodenum.                            The upper GI endoscopy was accomplished without                            difficulty. The patient tolerated the procedure. Scope In: Scope Out: Findings:                 No gross lesions were noted in the proximal                            esophagus and in the mid esophagus.                           Grade I varices were found in the distal esophagus                            (look unchanged from previous).                           The Z-line was irregular and was found 45 cm from                            the incisors.                           Moderate portal hypertensive gastropathy was found                            in the entire examined stomach.                           Diffuse severe inflammation with hemorrhage                            characterized by erosions, friability and  granularity was found in the entire examined                            stomach. With the patient's history of intestinal                            metaplasia, gastric mapping was performed. Biopsies                            were taken with a cold forceps for histology from                            the cardia and fundus. Biopsies were taken with a                            cold forceps for histology from the greater curve                            and lesser curve of the body. Biopsies were taken                            with a cold forceps for histology from the antrum                            and incisura.                           No gross lesions were noted in the duodenal bulb,                            in the first portion of the duodenum and in the                            second portion of the duodenum. Complications:            No immediate complications. Estimated Blood Loss:     Estimated blood loss was minimal. Impression:               - No gross  lesions in the proximal esophagus and in                            the mid esophagus.                           - Grade I esophageal varices noted distally.                           - Z-line irregular, 45 cm from the incisors.                           - Portal hypertensive gastropathy.                           - Gastritis with hemorrhage. Gastric mapping  performed.                           - No gross lesions in the duodenal bulb, in the                            first portion of the duodenum and in the second                            portion of the duodenum. Recommendation:           - Proceed to scheduled colonoscopy.                           - Observe patient's clinical course.                           - Await pathology results.                           - Initiate Nexium 40 mg twice daily for 2 months                            then decrease to once daily.                           - Await pathology results.                           - Continue atenolol at current dosing.                           - Repeat upper endoscopy in 3 years for                            surveillance of varices and likely intestinal                            metaplasia (consider sooner follow-up based on                            overall gastric intestinal metaplasia findings).                           - The findings and recommendations were discussed                            with the patient.                           - The findings and recommendations were discussed                            with the patient's family. Justice Britain, MD 06/24/2022 11:41:09 AM

## 2022-06-24 NOTE — Progress Notes (Signed)
Called to room to assist during endoscopic procedure.  Patient ID and intended procedure confirmed with present staff. Received instructions for my participation in the procedure from the performing physician.  

## 2022-06-24 NOTE — Progress Notes (Signed)
Report to PACU, RN, vss, BBS= Clear.  

## 2022-06-25 ENCOUNTER — Telehealth: Payer: Self-pay | Admitting: *Deleted

## 2022-06-25 DIAGNOSIS — H35371 Puckering of macula, right eye: Secondary | ICD-10-CM | POA: Diagnosis not present

## 2022-06-25 DIAGNOSIS — H353131 Nonexudative age-related macular degeneration, bilateral, early dry stage: Secondary | ICD-10-CM | POA: Diagnosis not present

## 2022-06-25 DIAGNOSIS — H25812 Combined forms of age-related cataract, left eye: Secondary | ICD-10-CM | POA: Diagnosis not present

## 2022-06-25 DIAGNOSIS — H35363 Drusen (degenerative) of macula, bilateral: Secondary | ICD-10-CM | POA: Diagnosis not present

## 2022-06-25 DIAGNOSIS — H35453 Secondary pigmentary degeneration, bilateral: Secondary | ICD-10-CM | POA: Diagnosis not present

## 2022-06-25 NOTE — Telephone Encounter (Signed)
  Follow up Call-     06/24/2022   10:31 AM  Call back number  Post procedure Call Back phone  # [phone]  Permission to leave phone message Yes     Patient questions:  Do you have a fever, pain , or abdominal swelling? No. Pain Score  0 *  Have you tolerated food without any problems? Yes.    Have you been able to return to your normal activities? Yes.    Do you have any questions about your discharge instructions: Diet   No. Medications  No. Follow up visit  No.  Do you have questions or concerns about your Care? No.  Actions: * If pain score is 4 or above: No action needed, pain <4.

## 2022-06-25 NOTE — Telephone Encounter (Signed)
Spoke with patient advised Dr. Vaughan Browner would not fill hycodan prescription before 1 month. Patient verbalized understanding. NFN

## 2022-06-25 NOTE — Telephone Encounter (Signed)
I prefer not to refill the hycodan before 1 month from last prescription.

## 2022-06-26 ENCOUNTER — Other Ambulatory Visit: Payer: Self-pay

## 2022-06-26 ENCOUNTER — Other Ambulatory Visit: Payer: Medicare Other

## 2022-06-26 DIAGNOSIS — B20 Human immunodeficiency virus [HIV] disease: Secondary | ICD-10-CM

## 2022-06-28 LAB — COMPREHENSIVE METABOLIC PANEL
AG Ratio: 1.4 (calc) (ref 1.0–2.5)
ALT: 20 U/L (ref 9–46)
AST: 31 U/L (ref 10–35)
Albumin: 3.6 g/dL (ref 3.6–5.1)
Alkaline phosphatase (APISO): 98 U/L (ref 35–144)
BUN/Creatinine Ratio: 21 (calc) (ref 6–22)
BUN: 13 mg/dL (ref 7–25)
CO2: 27 mmol/L (ref 20–32)
Calcium: 9.1 mg/dL (ref 8.6–10.3)
Chloride: 109 mmol/L (ref 98–110)
Creat: 0.63 mg/dL — ABNORMAL LOW (ref 0.70–1.28)
Globulin: 2.5 g/dL (calc) (ref 1.9–3.7)
Glucose, Bld: 121 mg/dL — ABNORMAL HIGH (ref 65–99)
Potassium: 4.2 mmol/L (ref 3.5–5.3)
Sodium: 143 mmol/L (ref 135–146)
Total Bilirubin: 2.9 mg/dL — ABNORMAL HIGH (ref 0.2–1.2)
Total Protein: 6.1 g/dL (ref 6.1–8.1)

## 2022-06-28 LAB — HIV-1 RNA QUANT-NO REFLEX-BLD
HIV 1 RNA Quant: NOT DETECTED Copies/mL
HIV-1 RNA Quant, Log: NOT DETECTED Log cps/mL

## 2022-06-28 LAB — T-HELPER CELLS (CD4) COUNT (NOT AT ARMC)
Absolute CD4: 793 cells/uL (ref 490–1740)
CD4 T Helper %: 54 % (ref 30–61)
Total lymphocyte count: 1467 cells/uL (ref 850–3900)

## 2022-06-30 ENCOUNTER — Encounter: Payer: Self-pay | Admitting: Gastroenterology

## 2022-07-06 ENCOUNTER — Telehealth: Payer: Self-pay | Admitting: Pulmonary Disease

## 2022-07-06 MED ORDER — HYDROCODONE BIT-HOMATROP MBR 5-1.5 MG/5ML PO SOLN: 5.0000 mL | Freq: Four times a day (QID) | ORAL | 0 refills | Status: DC | PRN

## 2022-07-06 NOTE — Telephone Encounter (Signed)
Pt called the office back. I let him know that we have sent a message to Dr. Isaiah Serge checking to see if he is okay with refilling his hycodan. Stated to pt that we would call him back once we heard from Dr. Isaiah Serge and he verbalized understanding.   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

## 2022-07-06 NOTE — Telephone Encounter (Signed)
Dr. Isaiah Serge pt has called requesting refill on Hycodan cough syrup. Please advise?  Pharmacy [facility] on [address]

## 2022-07-06 NOTE — Telephone Encounter (Signed)
Call at 10:30. He won't be mowing the lawn then.

## 2022-07-06 NOTE — Telephone Encounter (Signed)
I have sent the prescription in

## 2022-07-06 NOTE — Telephone Encounter (Signed)
PT ret Paige's call. Pls call back. TY.

## 2022-07-06 NOTE — Telephone Encounter (Signed)
ATC X1 left message advising Dr. Isaiah Serge has refilled patients Hycodan cough syrup

## 2022-07-06 NOTE — Telephone Encounter (Signed)
ATC X1 LVM for patient to call the office back 

## 2022-07-06 NOTE — Telephone Encounter (Signed)
PT would like Dr. Isaiah Serge to call in more cough syrup for him.  Pharm is [facility] [address]   His # is [phone]  TY

## 2022-07-09 ENCOUNTER — Other Ambulatory Visit (HOSPITAL_COMMUNITY): Payer: Self-pay

## 2022-07-10 ENCOUNTER — Encounter: Payer: Self-pay | Admitting: Family

## 2022-07-10 ENCOUNTER — Ambulatory Visit (INDEPENDENT_AMBULATORY_CARE_PROVIDER_SITE_OTHER): Payer: Medicare Other | Admitting: Family

## 2022-07-10 ENCOUNTER — Other Ambulatory Visit: Payer: Self-pay

## 2022-07-10 VITALS — BP 137/83 | HR 58 | Temp 98.3°F | Ht 71.0 in | Wt 199.0 lb

## 2022-07-10 DIAGNOSIS — B20 Human immunodeficiency virus [HIV] disease: Secondary | ICD-10-CM

## 2022-07-10 DIAGNOSIS — Z23 Encounter for immunization: Secondary | ICD-10-CM

## 2022-07-10 DIAGNOSIS — Z0000 Encounter for general adult medical examination without abnormal findings: Secondary | ICD-10-CM | POA: Diagnosis not present

## 2022-07-10 NOTE — Patient Instructions (Addendum)
Nice to see you.  Continue to take your medication daily as prescribed.  Refills have been sent to the pharmacy.  Plan for follow up in 6 months or sooner if needed with lab work 1-2 weeks prior to appointment.   Have a great day and stay safe!  

## 2022-07-10 NOTE — Progress Notes (Signed)
Brief Narrative   Patient ID: Tyler Hendricks, male    DOB: 1950/12/12, 72 y.o.   MRN: [id number]  Tyler Hendricks is a 72 y/o caucasian male diagnosed with HIV disease in 1989 with risk factor being IV drug use. Initial viral load is unknown with CD4 nadir of 510. No history of opportunistic infection. [id number] negative. Previous ART history with Zerit/Stauvadine-Epivir, Atripla, Complera and Odefsey.    Subjective:    Chief Complaint  Patient presents with   Follow-up    HPI:  Tyler Hendricks is a 72 y.o. male with HIV disease last seen on 01/02/2022 with well-controlled virus and good adherence and tolerance to [employer].  Viral load was undetectable with CD4 count of 615.  Kidney function, liver function, electrolytes within normal ranges.  Most recent lab work completed on 06/26/2022 with viral load that remains undetectable and CD4 count of 793.  Kidney function, liver function, electrolytes within normal ranges.  Here today for routine follow-up.  Tyler Hendricks has been doing well since his last office visit and continues to take Zion as prescribed with no adverse side effects or problems obtaining medication from the pharmacy.  No new concerns/complaints today.  Condoms and STD testing offered.  Healthcare maintenance due includes Prevnar 20.  Denies fevers, chills, night sweats, headaches, changes in vision, neck pain/stiffness, nausea, diarrhea, vomiting, lesions or rashes.   Allergies  Allergen Reactions   Integrilin [Eptifibatide] Other (See Comments)    Bleeding (non-specific)   Lopressor [Metoprolol Tartrate] Rash and Other (See Comments)    Bleeding (non-specific)    Penicillins Diarrhea and Rash      Outpatient Medications Prior to Visit  Medication Sig Dispense Refill   amLODipine (NORVASC) 2.5 MG tablet Take 1 tablet (2.5 mg total) by mouth daily. TAKE 1 TABLET BY MOUTH EVERY DAY (Patient taking differently: Take 2.5 mg by mouth daily.) 90 tablet 3   atenolol (TENORMIN) 50 MG  tablet Take 1 tablet (50 mg total) by mouth daily. 90 tablet 3   bictegravir-emtricitabine-tenofovir AF (BIKTARVY) 50-200-25 MG TABS tablet Take 1 tablet by mouth daily. 30 tablet 11   clopidogrel (PLAVIX) 75 MG tablet Take 1 tablet (75 mg total) by mouth daily. 90 tablet 3   esomeprazole (NEXIUM) 40 MG capsule Take 1 capsule (40 mg total) by mouth 2 (two) times daily before a meal for 60 days, THEN 1 capsule (40 mg total) daily. 180 capsule 0   HYDROcodone bit-homatropine (HYCODAN) 5-1.5 MG/5ML syrup Take 5 mLs by mouth every 6 (six) hours as needed for cough. 240 mL 0   lactulose (CHRONULAC) 10 GM/15ML solution Take 45 mLs (30 g total) by mouth daily. 1350 mL 3   losartan-hydrochlorothiazide (HYZAAR) 100-25 MG tablet Take 1 tablet by mouth daily. 90 tablet 3   Multiple Vitamin (MULTIVITAMIN) tablet Take 1 tablet by mouth daily.     nitrofurantoin, macrocrystal-monohydrate, (MACROBID) 100 MG capsule Take 1 capsule (100 mg total) by mouth 2 (two) times daily. 14 capsule 0   OFEV 100 MG CAPS TAKE 1 CAPSULE BY MOUTH 2 TIMES A DAY WITH FOOD. TAKE 12 HOURS APART. 180 capsule 0   Oxycodone HCl 10 MG TABS Take 10 mg by mouth as needed (Pain).     pantoprazole (PROTONIX) 40 MG tablet Take 1 tablet (40 mg total) by mouth 2 (two) times daily. 60 tablet 5   rifaximin (XIFAXAN) 550 MG TABS tablet Take 1 tablet (550 mg total) by mouth 2 (two) times daily. 60 tablet 3  rosuvastatin (CRESTOR) 10 MG tablet Take 1 tablet (10 mg total) by mouth daily. 90 tablet 3   SYNJARDY XR 12.07-998 MG TB24 Take 2 tablets by mouth daily. 180 tablet 3   VASCEPA 1 g capsule Take 2 capsules (2 g total) by mouth 2 (two) times daily. 360 capsule 3   No facility-administered medications prior to visit.     Past Medical History:  Diagnosis Date   Abnormal result of cardiovascular function study, unspecified    Atherosclerotic heart disease of native coronary artery without angina pectoris    CAD (coronary artery disease)     Coronary angioplasty status    Diabetes mellitus without complication    Essential (primary) hypertension    Gallstones    Hepatic cirrhosis    Hepatitis B core antibody positive 01/08/2019   Hepatitis C    HIV infection    Hyperlipidemia    Hypertension    Internal hemorrhoids    Kidney stones    Lung disease    Mixed hyperlipidemia    Palpitations    Sleep apnea    CPAP   Tubular adenoma of colon      Past Surgical History:  Procedure Laterality Date   CARDIAC CATHETERIZATION Left 04/2013   chlecystectomy     CHOLECYSTECTOMY     COLONOSCOPY  05/02/2013   GAS/FLUID EXCHANGE Right 12/30/2021   Procedure: GAS/FLUID EXCHANGE;  Surgeon: Carmela Rima, MD;  Location: [facility] OR;  Service: Ophthalmology;  Laterality: Right;  SF6   PARS PLANA VITRECTOMY Right 12/30/2021   Procedure: PARS PLANA VITRECTOMY WITH 25 GAUGE;  Surgeon: Carmela Rima, MD;  Location: [facility] OR;  Service: Ophthalmology;  Laterality: Right;   PERCUTANEOUS CORONARY STENT INTERVENTION (PCI-S)     PHOTOCOAGULATION WITH LASER Right 12/30/2021   Procedure: PHOTOCOAGULATION WITH LASER;  Surgeon: Carmela Rima, MD;  Location: [facility] OR;  Service: Ophthalmology;  Laterality: Right;   POLYPECTOMY     UMBILICAL HERNIA REPAIR        Review of Systems  Constitutional:  Negative for appetite change, chills, fatigue, fever and unexpected weight change.  Eyes:  Negative for visual disturbance.  Respiratory:  Negative for cough, chest tightness, shortness of breath and wheezing.   Cardiovascular:  Negative for chest pain and leg swelling.  Gastrointestinal:  Negative for abdominal pain, constipation, diarrhea, nausea and vomiting.  Genitourinary:  Negative for dysuria, flank pain, frequency, genital sores, hematuria and urgency.  Skin:  Negative for rash.  Allergic/Immunologic: Negative for immunocompromised state.  Neurological:  Negative for dizziness and headaches.      Objective:    BP 137/83   Pulse (!) 58    Temp 98.3 F (36.8 C) (Temporal)   Ht 5\' 11"  (1.803 m)   Wt 199 lb (90.3 kg)   SpO2 93%   BMI 27.75 kg/m  Nursing note and vital signs reviewed.  Physical Exam Constitutional:      General: He is not in acute distress.    Appearance: He is well-developed.  Eyes:     Conjunctiva/sclera: Conjunctivae normal.  Cardiovascular:     Rate and Rhythm: Normal rate and regular rhythm.     Heart sounds: Normal heart sounds. No murmur heard.    No friction rub. No gallop.  Pulmonary:     Effort: Pulmonary effort is normal. No respiratory distress.     Breath sounds: Normal breath sounds. No wheezing or rales.  Chest:     Chest wall: No tenderness.  Abdominal:     General:  Bowel sounds are normal.     Palpations: Abdomen is soft.     Tenderness: There is no abdominal tenderness.  Musculoskeletal:     Cervical back: Neck supple.  Lymphadenopathy:     Cervical: No cervical adenopathy.  Skin:    General: Skin is warm and dry.     Findings: No rash.  Neurological:     Mental Status: He is alert and oriented to person, place, and time.  Psychiatric:        Behavior: Behavior normal.        Thought Content: Thought content normal.        Judgment: Judgment normal.         07/10/2022    9:29 AM 05/06/2022    1:38 PM 01/02/2022    9:39 AM 12/22/2021    9:25 AM 09/19/2021   10:57 AM  Depression screen PHQ 2/9  Decreased Interest 0 0 0 0 0  Down, Depressed, Hopeless 0 0 0 0 0  PHQ - 2 Score 0 0 0 0 0  Altered sleeping     1  Tired, decreased energy     0  Change in appetite     0  Feeling bad or failure about yourself      0  Trouble concentrating     0  Moving slowly or fidgety/restless     0  Suicidal thoughts     0  PHQ-9 Score     1  Difficult doing work/chores     Not difficult at all       Assessment & Plan:    Patient Active Problem List   Diagnosis Date Noted   Abdominal pain 04/22/2022   Degeneration of lumbar intervertebral disc 02/18/2022   Long-term current  use of opiate analgesic 06/11/2020   Acquired thrombophilia 06/11/2020   Lumbar pain 04/24/2020   Degenerative lumbar spinal stenosis 04/24/2020   Scoliosis deformity of spine 04/24/2020   Neural foraminal stenosis of cervical spine 01/03/2020   Chronic chest wall pain 11/16/2019   Routine general medical examination at a health care facility 11/16/2019   Hyperlipidemia LDL goal <70 11/15/2019   Benign prostatic hyperplasia without lower urinary tract symptoms 11/15/2019   Interstitial pulmonary disease 07/18/2019   Chronic bilateral low back pain without sciatica 03/02/2019   Muscle cramps 03/02/2019   Cirrhosis of liver without ascites 01/08/2019   Secondary esophageal varices without bleeding 01/08/2019   Intestinal metaplasia of gastric mucosa 01/08/2019   Chronic cough 06/24/2018   Healthcare maintenance 05/11/2018   Hx of colonic polyp 04/27/2018   Change in bowel habit 04/27/2018   Ventral hernia without obstruction or gangrene 04/27/2018   Thrombocytopenia 04/27/2018   Hypersplenism 04/27/2018   Coronary artery disease without angina pectoris 02/25/2018   Obstructive sleep apnea 10/25/2017   Extrinsic asthma without complication 08/25/2017   Vitamin D deficiency 08/25/2017   HIV disease 04/15/2017   Primary osteoarthritis of both knees 04/15/2017   Type 2 diabetes with complication 04/15/2017   Essential hypertension 04/15/2017     Problem List Items Addressed This Visit       Other   HIV disease    Tyler Hendricks continues to have well-controlled virus with good adherence and tolerance to [employer].  Reviewed lab work and discussed plan of care.  Continue current dose of Biktarvy.  Plan for follow-up in 6 months or sooner if needed with lab work 1 to 2 weeks prior to appointment.      Healthcare maintenance  Discussed importance of safe sexual practice and condom use. Condoms and STD testing offered.  Prevnar 20 updated.       Other Visit Diagnoses     Need for  pneumococcal 20-valent conjugate vaccination    -  Primary   Relevant Orders   Pneumococcal conjugate vaccine 20-valent (Prevnar-20) (Completed)        I am having Tyler Hendricks maintain his multivitamin, Oxycodone HCl, amLODipine, atenolol, clopidogrel, losartan-hydrochlorothiazide, rosuvastatin, Vascepa, Synjardy XR, Biktarvy, pantoprazole, rifaximin, lactulose, nitrofurantoin (macrocrystal-monohydrate), Ofev, esomeprazole, and HYDROcodone bit-homatropine.   Follow-up: Return in about 6 months (around 01/09/2023).   Marcos Eke, MSN, FNP-C Nurse Practitioner [facility] Main number: [phone]

## 2022-07-10 NOTE — Assessment & Plan Note (Signed)
Franke continues to have well-controlled virus with good adherence and tolerance to Biktarvy.  Reviewed lab work and discussed plan of care.  Continue current dose of Biktarvy.  Plan for follow-up in 6 months or sooner if needed with lab work 1 to 2 weeks prior to appointment. 

## 2022-07-10 NOTE — Assessment & Plan Note (Signed)
Discussed importance of safe sexual practice and condom use. Condoms and STD testing offered.   Prevnar 20 updated.  

## 2022-07-23 ENCOUNTER — Other Ambulatory Visit: Payer: Self-pay

## 2022-07-23 ENCOUNTER — Other Ambulatory Visit (HOSPITAL_COMMUNITY): Payer: Self-pay

## 2022-07-29 DIAGNOSIS — H938X3 Other specified disorders of ear, bilateral: Secondary | ICD-10-CM | POA: Diagnosis not present

## 2022-07-31 DIAGNOSIS — H35371 Puckering of macula, right eye: Secondary | ICD-10-CM | POA: Diagnosis not present

## 2022-07-31 DIAGNOSIS — H35453 Secondary pigmentary degeneration, bilateral: Secondary | ICD-10-CM | POA: Diagnosis not present

## 2022-07-31 DIAGNOSIS — Z961 Presence of intraocular lens: Secondary | ICD-10-CM | POA: Diagnosis not present

## 2022-07-31 DIAGNOSIS — H25812 Combined forms of age-related cataract, left eye: Secondary | ICD-10-CM | POA: Diagnosis not present

## 2022-07-31 DIAGNOSIS — H35363 Drusen (degenerative) of macula, bilateral: Secondary | ICD-10-CM | POA: Diagnosis not present

## 2022-07-31 DIAGNOSIS — H353131 Nonexudative age-related macular degeneration, bilateral, early dry stage: Secondary | ICD-10-CM | POA: Diagnosis not present

## 2022-08-03 ENCOUNTER — Telehealth: Payer: Self-pay | Admitting: Pulmonary Disease

## 2022-08-03 MED ORDER — HYDROCODONE BIT-HOMATROP MBR 5-1.5 MG/5ML PO SOLN: 5.0000 mL | Freq: Four times a day (QID) | ORAL | 0 refills | Status: DC | PRN

## 2022-08-03 NOTE — Telephone Encounter (Signed)
Pt called back to see about his prescription

## 2022-08-03 NOTE — Telephone Encounter (Signed)
Dr. Mannam please advise. °

## 2022-08-03 NOTE — Telephone Encounter (Signed)
Spoke with patient advised refill of cough syrup has been sent to pharmacy. NFN

## 2022-08-03 NOTE — Telephone Encounter (Signed)
Refill has been sent in to [facility] on [address]

## 2022-08-03 NOTE — Telephone Encounter (Signed)
Needs refill on his cough syrup. He is out  [facility] on [address]

## 2022-08-03 NOTE — Telephone Encounter (Signed)
ATC X1 LVM for patient. Please advise message has been sent to Dr. Isaiah Serge in regards to cough syrup. We will call back once he responds

## 2022-08-06 ENCOUNTER — Other Ambulatory Visit (HOSPITAL_COMMUNITY): Payer: Self-pay

## 2022-08-07 ENCOUNTER — Other Ambulatory Visit (HOSPITAL_COMMUNITY): Payer: Self-pay

## 2022-08-14 ENCOUNTER — Other Ambulatory Visit (HOSPITAL_COMMUNITY): Payer: Self-pay

## 2022-08-18 ENCOUNTER — Other Ambulatory Visit: Payer: Self-pay

## 2022-08-25 ENCOUNTER — Telehealth: Payer: Self-pay | Admitting: Pulmonary Disease

## 2022-08-25 DIAGNOSIS — J84112 Idiopathic pulmonary fibrosis: Secondary | ICD-10-CM

## 2022-08-25 NOTE — Telephone Encounter (Signed)
Refill sent for OFEV to [facility] (pulmonary fibrosis team): [phone]  Dose: 100 mg twice daily  Last OV: 04/17/22 Provider: Dr. Isaiah Serge  Next OV: not scheduled, overdue  LFTs on 06/26/22 wnl  Chesley Mires, PharmD, MPH, BCPS Clinical Pharmacist (Rheumatology and Pulmonology)

## 2022-08-28 ENCOUNTER — Ambulatory Visit (INDEPENDENT_AMBULATORY_CARE_PROVIDER_SITE_OTHER): Payer: Medicare Other | Admitting: Pulmonary Disease

## 2022-08-28 ENCOUNTER — Encounter: Payer: Self-pay | Admitting: Pulmonary Disease

## 2022-08-28 VITALS — BP 136/80 | HR 61 | Temp 98.1°F | Ht 72.0 in | Wt 202.0 lb

## 2022-08-28 DIAGNOSIS — J84112 Idiopathic pulmonary fibrosis: Secondary | ICD-10-CM

## 2022-08-28 DIAGNOSIS — Z5181 Encounter for therapeutic drug level monitoring: Secondary | ICD-10-CM | POA: Diagnosis not present

## 2022-08-28 DIAGNOSIS — R0602 Shortness of breath: Secondary | ICD-10-CM | POA: Diagnosis not present

## 2022-08-28 LAB — COMPREHENSIVE METABOLIC PANEL
ALT: 21 U/L (ref 0–53)
AST: 34 U/L (ref 0–37)
Albumin: 3.6 g/dL (ref 3.5–5.2)
Alkaline Phosphatase: 117 U/L (ref 39–117)
BUN: 19 mg/dL (ref 6–23)
CO2: 28 mEq/L (ref 19–32)
Calcium: 9.4 mg/dL (ref 8.4–10.5)
Chloride: 106 mEq/L (ref 96–112)
Creatinine, Ser: 0.79 mg/dL (ref 0.40–1.50)
GFR: 89.11 mL/min (ref >60.00)
Glucose, Bld: 101 mg/dL — ABNORMAL HIGH (ref 70–99)
Potassium: 4.2 mEq/L (ref 3.5–5.1)
Sodium: 141 mEq/L (ref 135–145)
Total Bilirubin: 2.9 mg/dL — ABNORMAL HIGH (ref 0.2–1.2)
Total Protein: 6.6 g/dL (ref 6.0–8.3)

## 2022-08-28 MED ORDER — HYDROCODONE BIT-HOMATROP MBR 5-1.5 MG/5ML PO SOLN: 5.0000 mL | Freq: Four times a day (QID) | ORAL | 0 refills | Status: DC | PRN

## 2022-08-28 NOTE — Progress Notes (Signed)
Tyler Hendricks    [id number]    10/01/1950  Primary Care Physician:Crawford, Austin Miles, MD  Referring Physician: Myrlene Broker, MD [address] [place],  [place] [address]  Problem list: Follow-up for pulmonary fibrosis Started on Ofev in late June 2022.   HPI: 72 y.o.  with history of coronary artery disease, HIV, hepatitis C, cirrhosis Complains of chronic cough for the past several years. He has paroxysms of cough associated with dyspnea. Mild chest congestion History notable for COVID-19 infection in December 2020. He had very mild symptoms and did not require hospitalization.  CT scan with probable UIP pattern pulmonary fibrosis. He has been referred to the ILD clinic for further evaluation  Pets: Has a dog. No cats, birds, farm animals Occupation: Retired Psychologist, forensic Exposures: No known exposures. No mold, hot tub, Jacuzzi. No down pillows or comforters ILD questionnaire 10/03/2019-negative Smoking history: 60-pack-year smoker. Quit in late 1990s Travel history: Originally from [place]. Moved to [place] in 2018 Relevant family history: Mother had emphysema. She was a heavy smoker.  Interim history: Taking Ofev daily.  He is tolerating it well with minimal diarrhea Cough is his main complaint and he is taking codeine cough medication which controls it  Outpatient Encounter Medications as of 08/28/2022  Medication Sig   amLODipine (NORVASC) 2.5 MG tablet Take 1 tablet (2.5 mg total) by mouth daily. TAKE 1 TABLET BY MOUTH EVERY DAY (Patient taking differently: Take 2.5 mg by mouth daily.)   atenolol (TENORMIN) 50 MG tablet Take 1 tablet (50 mg total) by mouth daily.   bictegravir-emtricitabine-tenofovir AF (BIKTARVY) 50-200-25 MG TABS tablet Take 1 tablet by mouth daily.   clopidogrel (PLAVIX) 75 MG tablet Take 1 tablet (75 mg total) by mouth daily.   esomeprazole (NEXIUM) 40 MG capsule Take 1 capsule (40 mg total) by mouth  2 (two) times daily before a meal for 60 days, THEN 1 capsule (40 mg total) daily.   HYDROcodone bit-homatropine (HYCODAN) 5-1.5 MG/5ML syrup Take 5 mLs by mouth every 6 (six) hours as needed for cough.   losartan-hydrochlorothiazide (HYZAAR) 100-25 MG tablet Take 1 tablet by mouth daily.   Multiple Vitamin (MULTIVITAMIN) tablet Take 1 tablet by mouth daily.   Nintedanib (OFEV) 100 MG CAPS TAKE 1 CAPSULE BY MOUTH 2 TIMES A DAY WITH FOOD. TAKE 12 HOURS APART.   Oxycodone HCl 10 MG TABS Take 10 mg by mouth as needed (Pain).   rosuvastatin (CRESTOR) 10 MG tablet Take 1 tablet (10 mg total) by mouth daily.   SYNJARDY XR 12.07-998 MG TB24 Take 2 tablets by mouth daily.   VASCEPA 1 g capsule Take 2 capsules (2 g total) by mouth 2 (two) times daily.   pantoprazole (PROTONIX) 40 MG tablet Take 1 tablet (40 mg total) by mouth 2 (two) times daily.   [DISCONTINUED] lactulose (CHRONULAC) 10 GM/15ML solution Take 45 mLs (30 g total) by mouth daily.   [DISCONTINUED] nitrofurantoin, macrocrystal-monohydrate, (MACROBID) 100 MG capsule Take 1 capsule (100 mg total) by mouth 2 (two) times daily.   [DISCONTINUED] rifaximin (XIFAXAN) 550 MG TABS tablet Take 1 tablet (550 mg total) by mouth 2 (two) times daily.   No facility-administered encounter medications on file as of 08/28/2022.    Physical Exam: Blood pressure 136/80, pulse 61, temperature 98.1 F (36.7 C), temperature source Oral, height 6' (1.829 m), weight 202 lb (91.6 kg), SpO2 95 %. Gen:      No  acute distress HEENT:  EOMI, sclera anicteric Neck:     No masses; no thyromegaly Lungs:    Bibasal crackles CV:         Regular rate and rhythm; no murmurs Abd:      + bowel sounds; soft, non-tender; no palpable masses, no distension Ext:    No edema; adequate peripheral perfusion Skin:      Warm and dry; no rash Neuro: alert and oriented x 3 Psych: normal mood and affect   Data Reviewed: Imaging: High-res CT 07/13/2019-groundglass attenuation with  septal thickening, cylindrical bronchiectasis with mild basal gradient Probable UIP pattern High-res CT 06/24/2020-stable pulmonary fibrosis and probable UIP pattern High-res CT 04/21/2022-stable pattern of probable UIP pattern pulmonary fibrosis I have reviewed the images personally  PFTs: 09/01/2019 FVC 3.78 [78%], FEV1 3.14 [87%], F/F 83, TLC 6.40 [86%], DLCO 22.76 [81%] Normal test  06/28/2020 FVC 3.70 [71%], FEV1 3.05 [85%], F/F 82, TLC 5.76 [79%], DLCO 19.32 [69%] Minimal restriction, mild diffusion defect  07/22/2021 FVC 3.70 [1 7%], FEV1 3.01 [85%], F/F 81, TLC 5.84 [78%], DLCO 15.89 [57%] Minimal restriction, moderate diffusion defect  04/17/2022 FVC 3.69 [77%], FEV1 2.86 [82%], F/F78, TLC 5.49 [73%], DLCO 14.25 [52%] Mild restriction, moderate diffusion defect  Labs: CTD serologies 08/30/2019-positive for ANA 1:40, nuclear, speckled  Hepatic panel 12/30/2021-significant for AST 42, total bilirubin 4.3  Sleep: CPAP download 11/25/2019-2% compliance  Cardiac: Echocardiogram 09/06/2020-LVEF 60 to 65%, normal RV systolic size and function, TAPSE 1.5 cm  Assessment:  Pulmonary fibrosis, IPF Has probable UIP pattern on imaging There is no symptoms of connective tissue disease. Borderline positive ANA is likely nonspecific He did have Covid in December 2020 but it was a very mild case with no hospitalization, besides his symptoms preceded the Covid infection  He likely has IPF based on CT appearance in male smoker. Discussed further work-up such as bronchoscopy, lung biopsy but he wants to avoid invasive procedures  After multiple discussions informed decision making with the patient we have started treatment with Ofev which is tolerating well so far.  We need to be cautious given history of cirrhosis CT shows stable pulmonary fibrosis but he does have a drop in diffusion capacity which will monitor  Repeat comprehensive metabolic panel and proBNP today for monitoring Refer to  pulmonary rehab We discussed lung transplant and he wants to think more about.  Chronic cough Secondary to pulmonary fibrosis.  He is getting Hycodan cough syrup We discussed coming off Hycodan as he is also on oxy for chronic pain and he is trying to limit use of cough medication as much as possible. Continue PPI twice daily We discussed addition of Neurontin to help with neurogenic component but he feels that he is taking too many medications already and did not wanted it.  Severe OSA Noncompliant with CPAP.  Encouraged him to use it daily.  Health maintenance Up-to-date with Covid booster and flu vaccine  Plan/Recommendations: Continue Ofev Check labs today PPI twice daily Use CPAP Follow-up high-res CT and PFTs in 6 months  Chilton Greathouse MD [facility] 08/28/2022, 1:52 PM  CC: Myrlene Broker, *

## 2022-08-28 NOTE — Patient Instructions (Signed)
Will check comprehensive metabolic panel and proBNP today for monitoring I have called and Hycodan cough syrup Will get high-res CT and PFTs in 7 months Return to clinic in January 2025

## 2022-08-29 LAB — PRO B NATRIURETIC PEPTIDE: NT-Pro BNP: 74 pg/mL (ref 0–376)

## 2022-09-03 ENCOUNTER — Other Ambulatory Visit (HOSPITAL_COMMUNITY): Payer: Self-pay

## 2022-09-09 ENCOUNTER — Telehealth: Payer: Self-pay

## 2022-09-09 NOTE — Telephone Encounter (Signed)
Received notification from  [facility])  regarding a prior authorization for OFEV. Authorization has been APPROVED from 03/23/2022 to 09/08/2023, unless we are notified otherwise. Approval letter sent to scan center.

## 2022-09-11 ENCOUNTER — Other Ambulatory Visit (HOSPITAL_COMMUNITY): Payer: Self-pay

## 2022-09-14 ENCOUNTER — Other Ambulatory Visit: Payer: Self-pay

## 2022-09-14 ENCOUNTER — Other Ambulatory Visit: Payer: Self-pay | Admitting: Gastroenterology

## 2022-09-14 DIAGNOSIS — K2970 Gastritis, unspecified, without bleeding: Secondary | ICD-10-CM

## 2022-09-14 DIAGNOSIS — K229 Disease of esophagus, unspecified: Secondary | ICD-10-CM

## 2022-09-23 ENCOUNTER — Telehealth: Payer: Self-pay | Admitting: Pulmonary Disease

## 2022-09-23 NOTE — Telephone Encounter (Signed)
Patient states needs refill for cough syrup. Pharmacy is [facility]. Patient phone number is [phone].

## 2022-09-25 ENCOUNTER — Other Ambulatory Visit: Payer: Self-pay | Admitting: Pulmonary Disease

## 2022-09-25 MED ORDER — HYDROCODONE BIT-HOMATROP MBR 5-1.5 MG/5ML PO SOLN: 5.0000 mL | Freq: Four times a day (QID) | ORAL | 0 refills | Status: DC | PRN

## 2022-09-25 NOTE — Telephone Encounter (Signed)
Hycodan refilled.

## 2022-09-25 NOTE — Telephone Encounter (Signed)
Dr. Val Eagle are you willing to fill patients hycodan since Dr. Isaiah Serge is off

## 2022-09-25 NOTE — Telephone Encounter (Signed)
Spoke with patient. Advised cough syrup has been sent to pharmacy. NFN

## 2022-09-25 NOTE — Telephone Encounter (Signed)
Pt is checking on status on cough med

## 2022-10-02 DIAGNOSIS — H338 Other retinal detachments: Secondary | ICD-10-CM | POA: Diagnosis not present

## 2022-10-02 DIAGNOSIS — H35363 Drusen (degenerative) of macula, bilateral: Secondary | ICD-10-CM | POA: Diagnosis not present

## 2022-10-02 DIAGNOSIS — H353131 Nonexudative age-related macular degeneration, bilateral, early dry stage: Secondary | ICD-10-CM | POA: Diagnosis not present

## 2022-10-02 DIAGNOSIS — H35453 Secondary pigmentary degeneration, bilateral: Secondary | ICD-10-CM | POA: Diagnosis not present

## 2022-10-02 DIAGNOSIS — H25812 Combined forms of age-related cataract, left eye: Secondary | ICD-10-CM | POA: Diagnosis not present

## 2022-10-02 DIAGNOSIS — Z961 Presence of intraocular lens: Secondary | ICD-10-CM | POA: Diagnosis not present

## 2022-10-02 DIAGNOSIS — H35371 Puckering of macula, right eye: Secondary | ICD-10-CM | POA: Diagnosis not present

## 2022-10-06 ENCOUNTER — Other Ambulatory Visit (HOSPITAL_COMMUNITY): Payer: Self-pay

## 2022-10-08 ENCOUNTER — Encounter: Payer: Self-pay | Admitting: Podiatry

## 2022-10-08 ENCOUNTER — Ambulatory Visit (INDEPENDENT_AMBULATORY_CARE_PROVIDER_SITE_OTHER): Payer: Medicare Other | Admitting: Podiatry

## 2022-10-08 DIAGNOSIS — M79674 Pain in right toe(s): Secondary | ICD-10-CM

## 2022-10-08 DIAGNOSIS — B351 Tinea unguium: Secondary | ICD-10-CM

## 2022-10-08 DIAGNOSIS — M79675 Pain in left toe(s): Secondary | ICD-10-CM

## 2022-10-08 NOTE — Progress Notes (Signed)
This patient returns to my office for at risk foot care.  This patient requires this care by a professional since this patient will be at risk due to having diabetes and HIV.  This patient is unable to cut nails himself since the patient cannot reach his nails.These nails are painful walking and wearing shoes.  This patient presents for at risk foot care today.  General Appearance  Alert, conversant and in no acute stress.  Vascular  Dorsalis pedis and posterior tibial  pulses are palpable  bilaterally.  Capillary return is within normal limits  bilaterally. Temperature is within normal limits  bilaterally.  Neurologic  Senn-Weinstein monofilament wire test within normal limits  bilaterally. Muscle power within normal limits bilaterally.  Nails Thick disfigured discolored nails with subungual debris  hallux nails bilaterally. No evidence of bacterial infection or drainage bilaterally.  Orthopedic  No limitations of motion  feet .  No crepitus or effusions noted.  No bony pathology or digital deformities noted.  Skin  normotropic skin with no porokeratosis noted bilaterally.  No signs of infections or ulcers noted.     Onychomycosis  Pain in right toes  Pain in left toes  Consent was obtained for treatment procedures.   Mechanical debridement of nails 1-5  bilaterally performed with a nail nipper.  Filed with dremel without incident.    Return office visit   4 months                   Told patient to return for periodic foot care and evaluation due to potential at risk complications.   Helane Gunther DPM

## 2022-10-12 ENCOUNTER — Other Ambulatory Visit (HOSPITAL_COMMUNITY): Payer: Self-pay

## 2022-10-15 DIAGNOSIS — M5136 Other intervertebral disc degeneration, lumbar region: Secondary | ICD-10-CM | POA: Diagnosis not present

## 2022-10-15 DIAGNOSIS — M5416 Radiculopathy, lumbar region: Secondary | ICD-10-CM | POA: Diagnosis not present

## 2022-10-15 DIAGNOSIS — Z79891 Long term (current) use of opiate analgesic: Secondary | ICD-10-CM | POA: Diagnosis not present

## 2022-10-15 DIAGNOSIS — M48061 Spinal stenosis, lumbar region without neurogenic claudication: Secondary | ICD-10-CM | POA: Diagnosis not present

## 2022-10-15 IMAGING — CT CT CHEST HIGH RESOLUTION W/O CM
2 of 7 series · 13 of 36 positions shown, 16 images · non-contrast
Comparison: 07/13/2019

CLINICAL DATA: Interstitial lung disease

EXAM:
CT CHEST WITHOUT CONTRAST
TECHNIQUE: Multidetector CT imaging of the chest was performed following the
standard protocol without intravenous contrast. High resolution
imaging of the lungs, as well as inspiratory and expiratory imaging,
was performed.

[Series 4: chest 2.00 br36 s3 cor soft · coronal · 0.69mm/px · 3 of 184 slices shown]
[im 37/184  lung]
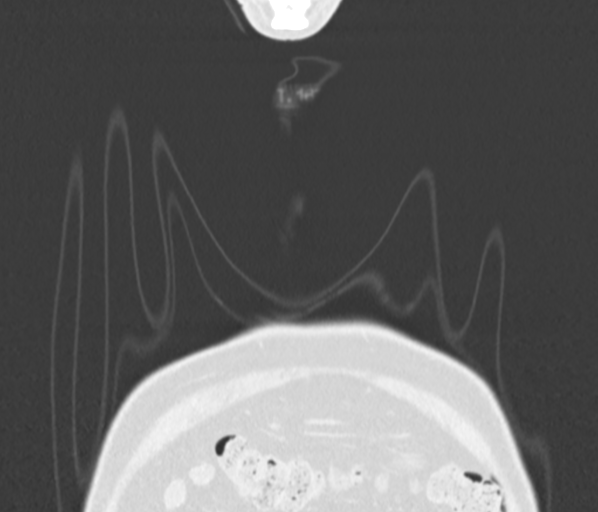
[im 74/184  lung]
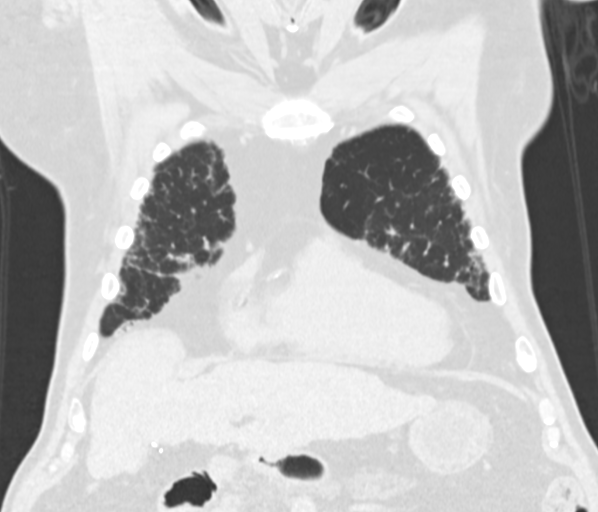
[im 110/184  lung]
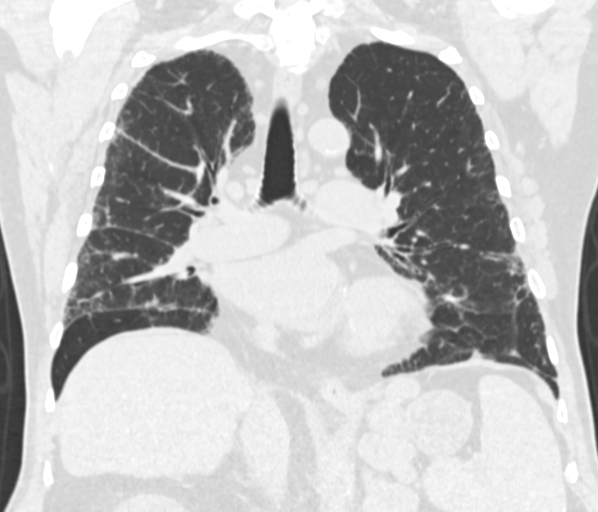

[Series 11: chest 1.00 br60 s3 high res thins 1x1 mm · axial · 0.72mm/px · z∈[+1416,+1713]mm · 10 of 352 slices shown, 13 images]
[im 28/352  mediastinal]
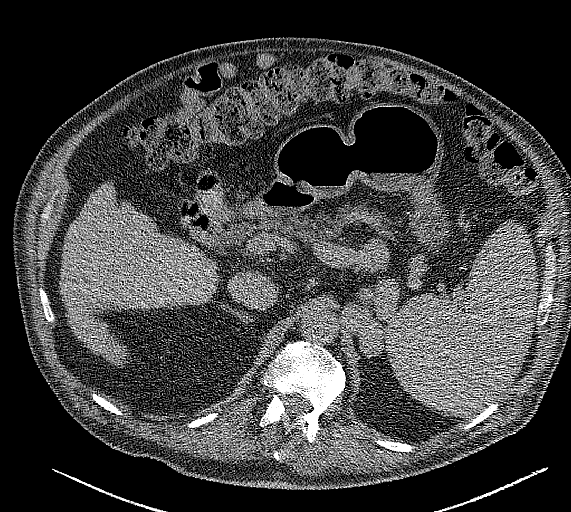
[im 28/352  lung]
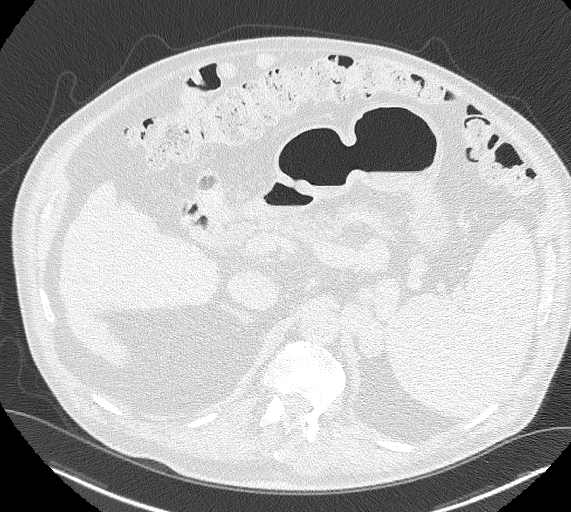
[im 55/352  lung]
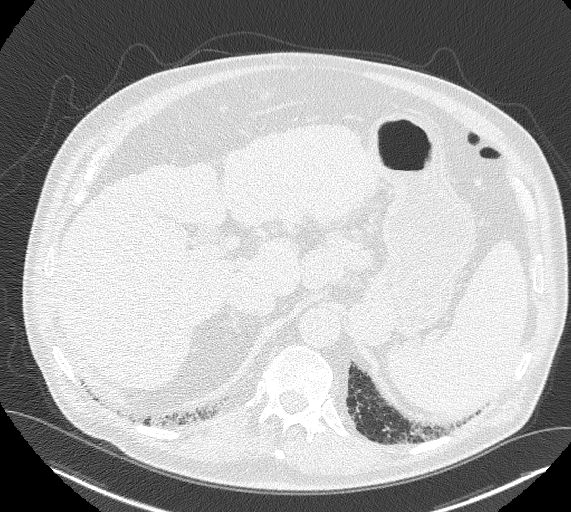
[im 109/352  lung]
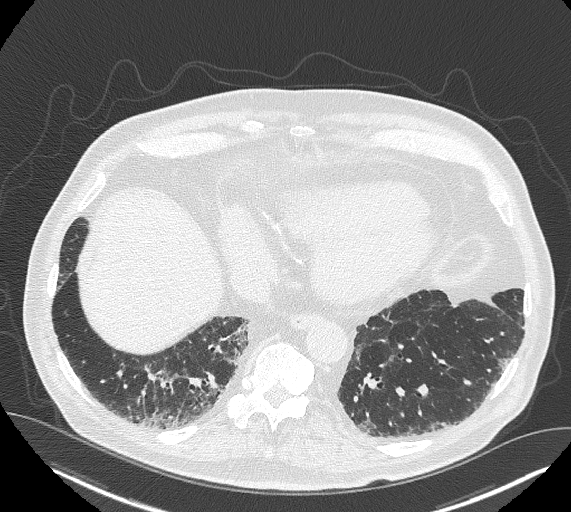
[im 136/352  lung]
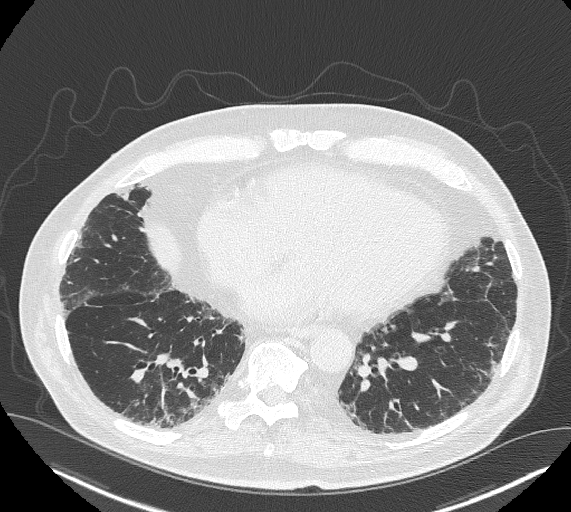
[im 163/352  mediastinal]
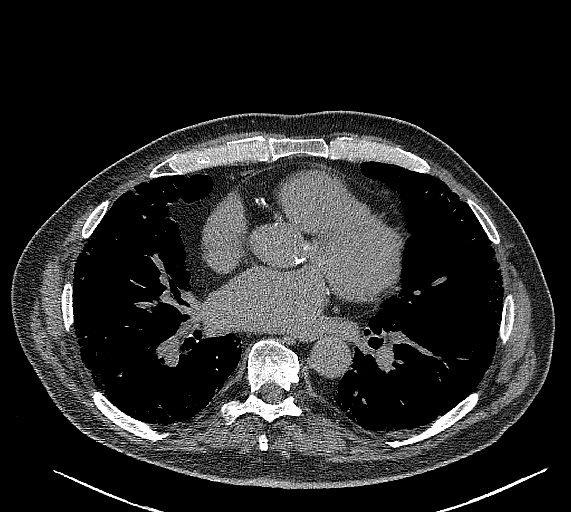
[im 163/352  lung]
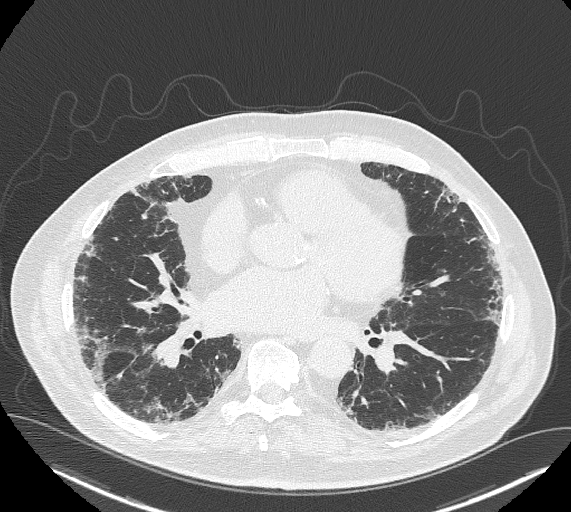
[im 190/352  lung]
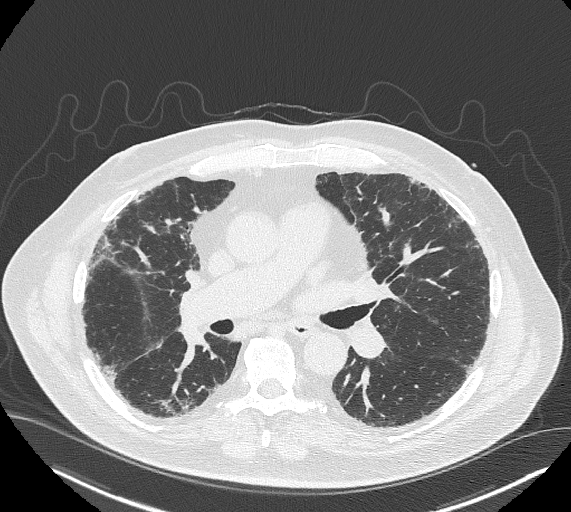
[im 217/352  lung]
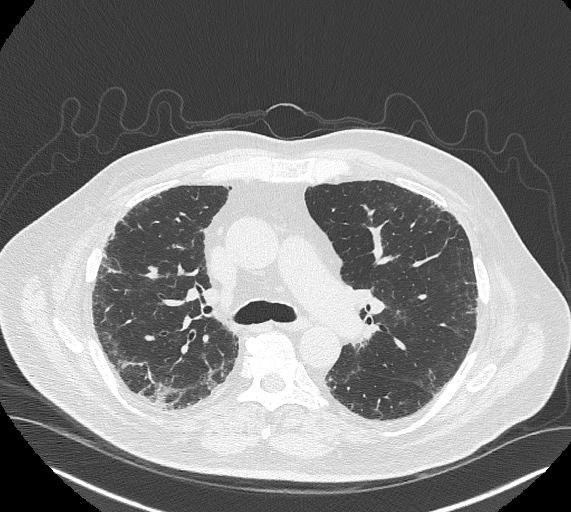
[im 271/352  lung]
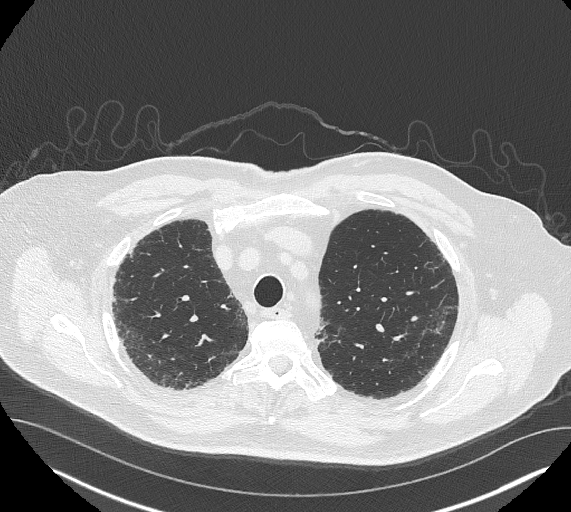
[im 298/352  mediastinal]
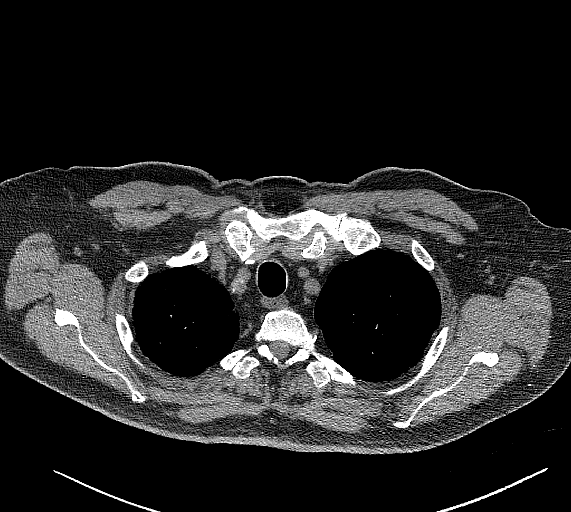
[im 298/352  lung]
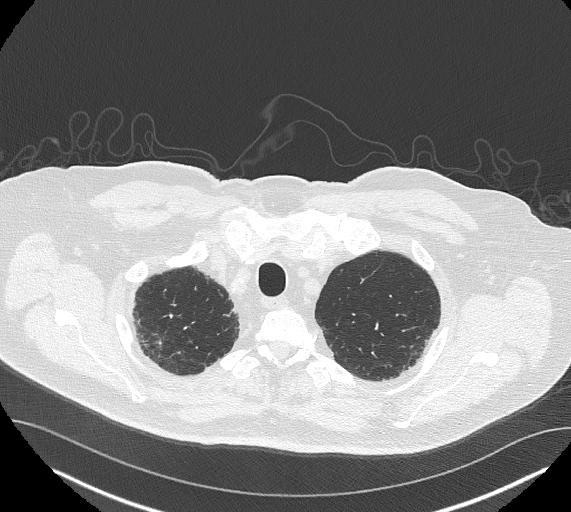
[im 325/352  lung]
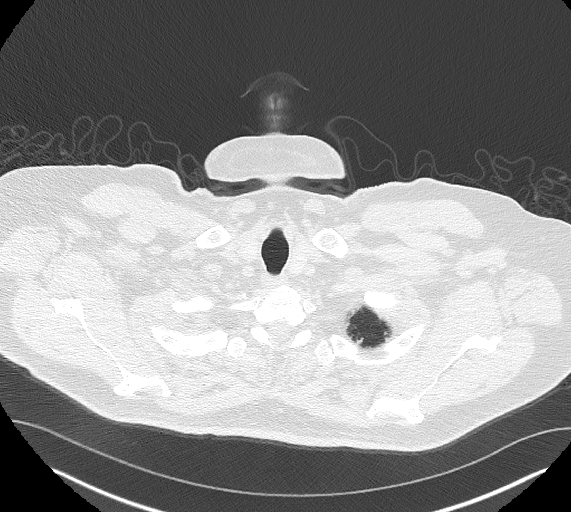

[13 of 36 positions shown; findings below may reference images not displayed]

FINDINGS: Cardiovascular: Aortic atherosclerosis. Dense aortic valve
calcifications. Cardiomegaly. Extensive 3 vessel coronary artery
calcifications and/or stents. No pericardial effusion.

Mediastinum/Nodes: No enlarged mediastinal, hilar, or axillary lymph
nodes. Thyroid gland, trachea, and esophagus demonstrate no
significant findings.

Lungs/Pleura: Redemonstrated mild pulmonary fibrosis in a pattern
with apical to basal gradient, featuring irregular peripheral
interstitial opacity, septal thickening, minimal traction
bronchiectasis, and areas of subpleural bronchiolectasis at the lung
bases. Fibrotic findings are unchanged compared to both immediate
prior examination and examination dated 03/03/2019. Mild air
trapping on expiratory phase imaging. No pleural effusion or
pneumothorax.

Upper Abdomen: No acute abnormality. Cirrhotic morphology of the
liver.

Musculoskeletal: No chest wall mass or suspicious bone lesions
identified.
IMPRESSION: 1. Redemonstrated mild pulmonary fibrosis in a pattern with apical
to basal gradient, featuring irregular peripheral interstitial
opacity, septal thickening, minimal traction bronchiectasis, and
areas of subpleural bronchiolectasis at the lung bases. Fibrotic
findings are unchanged compared to both immediate prior examination
and examination dated 03/03/2019. Findings remain consistent with a
"probable UIP" pattern by pulmonary fibrosis criteria. Findings are
categorized as probable UIP per consensus guidelines: Diagnosis of
Idiopathic Pulmonary Fibrosis: An Official ATS/ERS/JRS/ALAT Clinical
Practice Guideline. Am J Respir Crit Care Med Vol 198, Zvijezdan 5,
ppe77-e[DATE].
2. Mild air trapping on expiratory phase imaging, suggestive of
small airways disease.
3. Cardiomegaly and coronary artery disease.
4. Dense aortic valve calcifications. Correlate for
echocardiographic evidence of aortic valve dysfunction.
5. Cirrhotic morphology of the liver.

Aortic Atherosclerosis ([id number]-[id number]).

## 2022-10-16 ENCOUNTER — Encounter: Payer: Self-pay | Admitting: Internal Medicine

## 2022-10-16 ENCOUNTER — Other Ambulatory Visit: Payer: Self-pay | Admitting: Internal Medicine

## 2022-10-16 DIAGNOSIS — I2510 Atherosclerotic heart disease of native coronary artery without angina pectoris: Secondary | ICD-10-CM

## 2022-10-16 DIAGNOSIS — E781 Pure hyperglyceridemia: Secondary | ICD-10-CM

## 2022-10-16 NOTE — Telephone Encounter (Signed)
Okay for 30 day 0 refills

## 2022-10-19 ENCOUNTER — Other Ambulatory Visit: Payer: Self-pay

## 2022-10-19 ENCOUNTER — Telehealth: Payer: Self-pay | Admitting: Pulmonary Disease

## 2022-10-19 DIAGNOSIS — E118 Type 2 diabetes mellitus with unspecified complications: Secondary | ICD-10-CM

## 2022-10-19 MED ORDER — LOSARTAN POTASSIUM-HCTZ 100-25 MG PO TABS: 1.0000 | ORAL_TABLET | Freq: Every day | ORAL | 0 refills | Status: DC

## 2022-10-19 MED ORDER — SYNJARDY XR 12.5-1000 MG PO TB24
2.0000 | ORAL_TABLET | Freq: Every day | ORAL | 0 refills | Status: DC
Start: 2022-10-19 — End: 2022-11-03

## 2022-10-19 NOTE — Telephone Encounter (Signed)
Called and spoke with patient.  Patient requested a refill for Hycodan to be [facility] to [facility] [address]  Last refill - Hycodan 09/25/22, , no refills   Message routed to Dr. Isaiah Serge

## 2022-10-20 MED ORDER — HYDROCODONE BIT-HOMATROP MBR 5-1.5 MG/5ML PO SOLN: 5.0000 mL | Freq: Four times a day (QID) | ORAL | 0 refills | Status: DC | PRN

## 2022-10-20 NOTE — Telephone Encounter (Signed)
Patient called again to check to see if his refilled had been sent to the pharmacy.  Please confirm with the patient.  CB# [phone]

## 2022-10-20 NOTE — Telephone Encounter (Signed)
Pt. Calling back waiting on response from Dr.Mannam please advise

## 2022-10-20 NOTE — Telephone Encounter (Signed)
The prescription has been sent into the pharmacy and patient informed. Nothing further needed.

## 2022-10-21 ENCOUNTER — Encounter (INDEPENDENT_AMBULATORY_CARE_PROVIDER_SITE_OTHER): Payer: Self-pay

## 2022-11-03 ENCOUNTER — Other Ambulatory Visit (HOSPITAL_COMMUNITY): Payer: Self-pay

## 2022-11-03 ENCOUNTER — Encounter: Payer: Self-pay | Admitting: Internal Medicine

## 2022-11-03 ENCOUNTER — Ambulatory Visit (INDEPENDENT_AMBULATORY_CARE_PROVIDER_SITE_OTHER): Payer: Medicare Other | Admitting: Internal Medicine

## 2022-11-03 ENCOUNTER — Other Ambulatory Visit: Payer: Self-pay | Admitting: Internal Medicine

## 2022-11-03 VITALS — BP 162/90 | HR 64 | Temp 98.3°F | Ht 72.0 in | Wt 203.0 lb

## 2022-11-03 DIAGNOSIS — M170 Bilateral primary osteoarthritis of knee: Secondary | ICD-10-CM

## 2022-11-03 DIAGNOSIS — K7460 Unspecified cirrhosis of liver: Secondary | ICD-10-CM

## 2022-11-03 DIAGNOSIS — E785 Hyperlipidemia, unspecified: Secondary | ICD-10-CM | POA: Diagnosis not present

## 2022-11-03 DIAGNOSIS — I10 Essential (primary) hypertension: Secondary | ICD-10-CM

## 2022-11-03 DIAGNOSIS — Z0000 Encounter for general adult medical examination without abnormal findings: Secondary | ICD-10-CM | POA: Diagnosis not present

## 2022-11-03 DIAGNOSIS — J849 Interstitial pulmonary disease, unspecified: Secondary | ICD-10-CM

## 2022-11-03 DIAGNOSIS — E1169 Type 2 diabetes mellitus with other specified complication: Secondary | ICD-10-CM | POA: Diagnosis not present

## 2022-11-03 DIAGNOSIS — I2510 Atherosclerotic heart disease of native coronary artery without angina pectoris: Secondary | ICD-10-CM | POA: Diagnosis not present

## 2022-11-03 DIAGNOSIS — E118 Type 2 diabetes mellitus with unspecified complications: Secondary | ICD-10-CM

## 2022-11-03 DIAGNOSIS — I8510 Secondary esophageal varices without bleeding: Secondary | ICD-10-CM

## 2022-11-03 DIAGNOSIS — E781 Pure hyperglyceridemia: Secondary | ICD-10-CM | POA: Diagnosis not present

## 2022-11-03 DIAGNOSIS — D696 Thrombocytopenia, unspecified: Secondary | ICD-10-CM | POA: Diagnosis not present

## 2022-11-03 DIAGNOSIS — B20 Human immunodeficiency virus [HIV] disease: Secondary | ICD-10-CM

## 2022-11-03 LAB — LIPID PANEL
Cholesterol: 142 mg/dL (ref 0–200)
HDL: 70.2 mg/dL (ref >39.00)
LDL Cholesterol: 58 mg/dL (ref 0–99)
NonHDL: 71.72
Total CHOL/HDL Ratio: 2
Triglycerides: 68 mg/dL (ref 0.0–149.0)
VLDL: 13.6 mg/dL (ref 0.0–40.0)

## 2022-11-03 LAB — MICROALBUMIN / CREATININE URINE RATIO
Creatinine,U: 30.2 mg/dL
Microalb Creat Ratio: 12.1 mg/g (ref 0.0–30.0)
Microalb, Ur: 3.6 mg/dL — ABNORMAL HIGH (ref 0.0–1.9)

## 2022-11-03 LAB — HEMOGLOBIN A1C: Hgb A1c MFr Bld: 7 % — ABNORMAL HIGH (ref 4.6–6.5)

## 2022-11-03 MED ORDER — VASCEPA 1 G PO CAPS
2.0000 g | ORAL_CAPSULE | Freq: Two times a day (BID) | ORAL | 3 refills | Status: DC
Start: 2022-11-03 — End: 2024-01-10

## 2022-11-03 MED ORDER — LOSARTAN POTASSIUM-HCTZ 100-25 MG PO TABS: 1.0000 | ORAL_TABLET | Freq: Every day | ORAL | 3 refills | Status: DC

## 2022-11-03 MED ORDER — SYNJARDY XR 12.5-1000 MG PO TB24
2.0000 | ORAL_TABLET | Freq: Every day | ORAL | 3 refills | Status: DC
Start: 2022-11-03 — End: 2024-01-06

## 2022-11-03 MED ORDER — ROSUVASTATIN CALCIUM 10 MG PO TABS: 10.0000 mg | ORAL_TABLET | Freq: Every day | ORAL | 3 refills | Status: DC

## 2022-11-03 MED ORDER — OXYCODONE HCL 5 MG PO TABS: 5.0000 mg | ORAL_TABLET | ORAL | 0 refills | Status: DC | PRN

## 2022-11-03 NOTE — Progress Notes (Unsigned)
Subjective:   Patient ID: Tyler Hendricks, male    DOB: 1950-05-24, 72 y.o.   MRN: [id number]  HPI Here for medicare wellness, with chronic medical problems some worsening. Please see A/P for status and treatment of chronic medical problems.   Diet: DM since diabetic Physical activity: walks dog Depression/mood screen: negative Hearing: intact to whispered voice Visual acuity: grossly normal with lens, performs annual eye exam  ADLs: capable Fall risk: none Home safety: good Cognitive evaluation: intact to orientation, naming, recall and repetition EOL planning: adv directives discussed  Flowsheet Row Office Visit from 07/10/2022 in [facility]  PHQ-2 Total Score 0       Flowsheet Row Office Visit from 09/19/2021 in [facility]  PHQ-9 Total Score 1         12/22/2021    9:23 AM 12/30/2021    1:48 PM 01/02/2022    9:39 AM 05/06/2022    1:38 PM 07/10/2022    9:29 AM  Fall Risk  Falls in the past year? 0  0 0 0  Was there an injury with Fall? 0  0 0   Fall Risk Category Calculator 0  0 0   Fall Risk Category (Retired) Low  Low    (RETIRED) Patient Fall Risk Level Low fall risk Moderate fall risk     Patient at Risk for Falls Due to No Fall Risks   No Fall Risks No Fall Risks  Fall risk Follow up Falls prevention discussed   Falls evaluation completed Falls evaluation completed    I have personally reviewed and have noted 1. The patient's medical and social history - reviewed today no changes 2. Their use of alcohol, tobacco or illicit drugs 3. Their current medications and supplements 4. The patient's functional ability including ADL's, fall risks, home safety risks and hearing or visual impairment. 5. Diet and physical activities 6. Evidence for depression or mood disorders 7. Care team reviewed and updated 8.  The patient is on an opioid pain medication and this was reviewed with patient and  non-opioid pain medication options were reviewed and offered to patient, their pain treatment plan and severity was discussed with them. We have considered referrals as appropriate for patient. Opioid risk factors were also considered and reviewed.   Patient Care Team: Myrlene Broker, MD as PCP - General (Internal Medicine) Corky Crafts, MD as PCP - Cardiology (Cardiology) [facility] as Consulting Physician (Ophthalmology) Past Medical History:  Diagnosis Date   Abnormal result of cardiovascular function study, unspecified    Atherosclerotic heart disease of native coronary artery without angina pectoris    CAD (coronary artery disease)    Coronary angioplasty status    Diabetes mellitus without complication (HCC)    Essential (primary) hypertension    Gallstones    Hepatic cirrhosis (HCC)    Hepatitis B core antibody positive 01/08/2019   Hepatitis C    HIV infection (HCC)    Hyperlipidemia    Hypertension    Internal hemorrhoids    Kidney stones    Lung disease    Mixed hyperlipidemia    Palpitations    Sleep apnea    CPAP   Tubular adenoma of colon    Past Surgical History:  Procedure Laterality Date   CARDIAC CATHETERIZATION Left 04/2013   chlecystectomy     CHOLECYSTECTOMY     COLONOSCOPY  05/02/2013   GAS/FLUID EXCHANGE Right 12/30/2021  Procedure: GAS/FLUID EXCHANGE;  Surgeon: Carmela Rima, MD;  Location: [facility] OR;  Service: Ophthalmology;  Laterality: Right;  SF6   PARS PLANA VITRECTOMY Right 12/30/2021   Procedure: PARS PLANA VITRECTOMY WITH 25 GAUGE;  Surgeon: Carmela Rima, MD;  Location: [facility] OR;  Service: Ophthalmology;  Laterality: Right;   PERCUTANEOUS CORONARY STENT INTERVENTION (PCI-S)     PHOTOCOAGULATION WITH LASER Right 12/30/2021   Procedure: PHOTOCOAGULATION WITH LASER;  Surgeon: Carmela Rima, MD;  Location: [facility] OR;  Service: Ophthalmology;  Laterality: Right;   POLYPECTOMY     UMBILICAL HERNIA REPAIR     Family  History  Problem Relation Age of Onset   Emphysema Mother        pulmonary   Heart attack Father 9   Lung cancer Sister    Esophageal cancer Brother    Esophageal cancer Brother    Colon cancer Neg Hx    Inflammatory bowel disease Neg Hx    Liver disease Neg Hx    Pancreatic cancer Neg Hx    Rectal cancer Neg Hx    Stomach cancer Neg Hx    Colon polyps Neg Hx    Sleep apnea Neg Hx    Review of Systems  Constitutional: Negative.   HENT: Negative.    Eyes: Negative.   Respiratory:  Positive for cough and shortness of breath. Negative for chest tightness.   Cardiovascular:  Negative for chest pain, palpitations and leg swelling.  Gastrointestinal:  Negative for abdominal distention, abdominal pain, constipation, diarrhea, nausea and vomiting.  Musculoskeletal:  Positive for arthralgias.  Skin: Negative.   Neurological: Negative.   Psychiatric/Behavioral: Negative.      Objective:  Physical Exam Constitutional:      Appearance: He is well-developed.  HENT:     Head: Normocephalic and atraumatic.  Cardiovascular:     Rate and Rhythm: Normal rate and regular rhythm.  Pulmonary:     Effort: Pulmonary effort is normal. No respiratory distress.     Breath sounds: Normal breath sounds. No wheezing or rales.  Abdominal:     General: Bowel sounds are normal. There is no distension.     Palpations: Abdomen is soft.     Tenderness: There is no abdominal tenderness. There is no rebound.  Musculoskeletal:        General: Tenderness present.     Cervical back: Normal range of motion.  Skin:    General: Skin is warm and dry.  Neurological:     Mental Status: He is alert and oriented to person, place, and time.     Coordination: Coordination normal.     Vitals:   11/03/22 1316 11/03/22 1318  BP: (!) 162/90 (!) 162/90  Pulse: 64   Temp: 98.3 F (36.8 C)   TempSrc: Oral   SpO2: 98%   Weight: 203 lb (92.1 kg)   Height: 6' (1.829 m)     Assessment & Plan:

## 2022-11-03 NOTE — Patient Instructions (Signed)
We will do the labs today.

## 2022-11-05 NOTE — Assessment & Plan Note (Addendum)
Recent CBC stable, secondary to cirrhosis.

## 2022-11-05 NOTE — Assessment & Plan Note (Signed)
Checking lipid panel and adjust crestor 10 mg daily as needed. On vascepa 2 mg BID as well.

## 2022-11-05 NOTE — Assessment & Plan Note (Signed)
Seeing pulmonary and stable not sure if ofev is helping. SOB and chronic cough stable.

## 2022-11-05 NOTE — Assessment & Plan Note (Signed)
Checking lipid panel and CMP. Adjust as needed crestor 10 mg daily and on atenolol and losartan.

## 2022-11-05 NOTE — Assessment & Plan Note (Signed)
BP at goal on atenolol 50 mg daily and amlodipine 2.5 mg daily and losartan/hydrochlorothiazide 100/25 mg daily.

## 2022-11-05 NOTE — Assessment & Plan Note (Signed)
Stable no ascites or encephalopathy.

## 2022-11-05 NOTE — Assessment & Plan Note (Signed)
Flu shot yearly. Pneumonia complete. Shingrix due at pharmacy. Tetanus due 2030. Colonoscopy due 2031. Counseled about sun safety and mole surveillance. Counseled about the dangers of distracted driving. Given 10 year screening recommendations.

## 2022-11-05 NOTE — Assessment & Plan Note (Signed)
Seeing ID and well controlled on HAART.

## 2022-11-05 NOTE — Assessment & Plan Note (Signed)
Had a fall recently and rx for short term oxycodone ir 5 day supply only. [place] database reviewed and appropriate.

## 2022-11-05 NOTE — Assessment & Plan Note (Signed)
Checking HgA1c and microalbumin to creatinine ratio and lipid panel. Adjust synjardy 2 pills daily. On ARB and statin.

## 2022-11-05 NOTE — Assessment & Plan Note (Signed)
Taking atenolol and no signs of bleeding.

## 2022-11-09 ENCOUNTER — Telehealth: Payer: Self-pay | Admitting: Pulmonary Disease

## 2022-11-09 NOTE — Telephone Encounter (Signed)
Dr. Isaiah Hendricks patient is requesting refill on Hycodan cough syrup

## 2022-11-09 NOTE — Telephone Encounter (Signed)
Pt needs a refill for HYDROcodone bit-homatropine (HYCODAN) 5-1.5 MG/5ML syrup   [facility] [address]

## 2022-11-09 NOTE — Telephone Encounter (Signed)
Pt calling back to see if meds was sent in

## 2022-11-10 MED ORDER — HYDROCODONE BIT-HOMATROP MBR 5-1.5 MG/5ML PO SOLN: 5.0000 mL | Freq: Four times a day (QID) | ORAL | 0 refills | Status: DC | PRN

## 2022-11-10 NOTE — Telephone Encounter (Signed)
I called in the prescription and informed patient. Nothing further needed

## 2022-11-11 ENCOUNTER — Other Ambulatory Visit (HOSPITAL_COMMUNITY): Payer: Self-pay

## 2022-11-16 ENCOUNTER — Other Ambulatory Visit: Payer: Self-pay | Admitting: Internal Medicine

## 2022-11-16 ENCOUNTER — Telehealth: Payer: Self-pay | Admitting: Internal Medicine

## 2022-11-16 NOTE — Telephone Encounter (Signed)
Prescription Request  11/16/2022  LOV: 11/03/2022  What is the name of the medication or equipment? oxyCODONE (OXY IR/ROXICODONE) 5 MG immediate release tablet   Have you contacted your pharmacy to request a refill? No   Which pharmacy would you like this sent to?  [id number] [place], [place] - [address] [place] [address] Phone: [phone] Fax: [phone]    Patient notified that their request is being sent to the clinical staff for review and that they should receive a response within 2 business days.   Please advise at Mobile [phone] (mobile)

## 2022-11-16 NOTE — Telephone Encounter (Signed)
For his long term chronic prescription he should contact his orthopedic as they prescribe that. We had treated him for pain from fall acute only.

## 2022-11-17 NOTE — Telephone Encounter (Signed)
Spoke with patient and he has called orthopedics to have this refilled

## 2022-11-27 ENCOUNTER — Telehealth: Payer: Self-pay | Admitting: Pulmonary Disease

## 2022-11-27 MED ORDER — HYDROCODONE BIT-HOMATROP MBR 5-1.5 MG/5ML PO SOLN: 5.0000 mL | Freq: Four times a day (QID) | ORAL | 0 refills | Status: DC | PRN

## 2022-11-27 NOTE — Telephone Encounter (Signed)
Prescription sent in. Patient is aware. Nothing further needed

## 2022-11-27 NOTE — Telephone Encounter (Signed)
Pt calling in to get refill HYDROcodone bit-homatropine (HYCODAN) 5-1.5 MG/5ML syrup   [facility] [address]

## 2022-11-30 ENCOUNTER — Other Ambulatory Visit (HOSPITAL_COMMUNITY): Payer: Self-pay

## 2022-12-02 ENCOUNTER — Other Ambulatory Visit: Payer: Self-pay

## 2022-12-09 ENCOUNTER — Encounter: Payer: Self-pay | Admitting: Internal Medicine

## 2022-12-09 NOTE — Progress Notes (Unsigned)
Subjective:    Patient ID: Tyler Hendricks, male    DOB: 05-07-1950, 72 y.o.   MRN: [id number]      HPI Tyler Hendricks is here for No chief complaint on file.   Has cirrhosis of the liver and splenomegaly     Medications and allergies reviewed with patient and updated if appropriate.  Current Outpatient Medications on File Prior to Visit  Medication Sig Dispense Refill   amLODipine (NORVASC) 2.5 MG tablet TAKE 1 TABLET BY MOUTH EVERY DAY 90 tablet 3   atenolol (TENORMIN) 50 MG tablet TAKE 1 TABLET BY MOUTH EVERY DAY 90 tablet 3   bictegravir-emtricitabine-tenofovir AF (BIKTARVY) 50-200-25 MG TABS tablet Take 1 tablet by mouth daily. 30 tablet 11   clopidogrel (PLAVIX) 75 MG tablet TAKE 1 TABLET BY MOUTH EVERY DAY 90 tablet 3   esomeprazole (NEXIUM) 40 MG capsule Take 1 capsule (40 mg total) by mouth daily. 60 capsule 1   HYDROcodone bit-homatropine (HYCODAN) 5-1.5 MG/5ML syrup Take 5 mLs by mouth every 6 (six) hours as needed for cough. 240 mL 0   HYDROcodone bit-homatropine (HYCODAN) 5-1.5 MG/5ML syrup Take 5 mLs by mouth every 6 (six) hours as needed for cough. 240 mL 0   HYDROcodone bit-homatropine (HYCODAN) 5-1.5 MG/5ML syrup Take 5 mLs by mouth every 6 (six) hours as needed for cough. 240 mL 0   losartan-hydrochlorothiazide (HYZAAR) 100-25 MG tablet Take 1 tablet by mouth daily. 90 tablet 3   Multiple Vitamin (MULTIVITAMIN) tablet Take 1 tablet by mouth daily.     Nintedanib (OFEV) 100 MG CAPS TAKE 1 CAPSULE BY MOUTH 2 TIMES A DAY WITH FOOD. TAKE 12 HOURS APART. 180 capsule 1   oxyCODONE (OXY IR/ROXICODONE) 5 MG immediate release tablet Take 1 tablet (5 mg total) by mouth every 4 (four) hours as needed for severe pain. 30 tablet 0   Oxycodone HCl 10 MG TABS Take 10 mg by mouth as needed (Pain).     rosuvastatin (CRESTOR) 10 MG tablet Take 1 tablet (10 mg total) by mouth daily. 90 tablet 3   SYNJARDY XR 12.07-998 MG TB24 Take 2 tablets by mouth daily. 180 tablet 3   VASCEPA 1 g  capsule Take 2 capsules (2 g total) by mouth 2 (two) times daily. 360 capsule 3   No current facility-administered medications on file prior to visit.    Review of Systems     Objective:  There were no vitals filed for this visit. BP Readings from Last 3 Encounters:  11/03/22 (!) 162/90  08/28/22 136/80  07/10/22 137/83   Wt Readings from Last 3 Encounters:  11/03/22 203 lb (92.1 kg)  08/28/22 202 lb (91.6 kg)  07/10/22 199 lb (90.3 kg)   There is no height or weight on file to calculate BMI.    Physical Exam       US Abdomen Complete CLINICAL DATA:  Cirrhosis  EXAM: ABDOMEN ULTRASOUND COMPLETE  COMPARISON:  Abdominal ultrasound dated 11/24/2020.  FINDINGS: Gallbladder: Surgically absent.  Common bile duct: Diameter: 3 mm  Liver: No focal lesion identified. Within normal limits of parenchymal echogenicity. The liver has a nodular surface contour and coarsened echotexture, consistent with cirrhosis. Portal vein is patent on color Doppler imaging with normal direction of blood flow towards the liver.  IVC: No abnormality visualized.  Pancreas: Visualized portion unremarkable.  Spleen: Enlarged, measuring 14.4 cm in length with a volume of 796 mL.  Right Kidney: Length: 12.0 cm. Echogenicity within normal limits. No mass or  hydronephrosis visualized.  Left Kidney: Length: 11.9 cm. Echogenicity within normal limits. No mass or hydronephrosis visualized.  Abdominal aorta: No aneurysm visualized.  Other findings: None.  IMPRESSION: Cirrhosis and splenomegaly. No focal liver lesions.  Electronically Signed   By: Romona Curls M.D.   On: 04/29/2022 11:00    Assessment & Plan:    See Problem List for Assessment and Plan of chronic medical problems.

## 2022-12-10 ENCOUNTER — Ambulatory Visit (INDEPENDENT_AMBULATORY_CARE_PROVIDER_SITE_OTHER): Payer: Medicare Other | Admitting: Internal Medicine

## 2022-12-10 ENCOUNTER — Ambulatory Visit: Payer: Medicare Other | Admitting: Adult Health

## 2022-12-10 VITALS — BP 142/82 | HR 65 | Temp 98.3°F | Ht 72.0 in | Wt 195.0 lb

## 2022-12-10 DIAGNOSIS — K7460 Unspecified cirrhosis of liver: Secondary | ICD-10-CM

## 2022-12-10 DIAGNOSIS — R5383 Other fatigue: Secondary | ICD-10-CM | POA: Diagnosis not present

## 2022-12-10 DIAGNOSIS — E118 Type 2 diabetes mellitus with unspecified complications: Secondary | ICD-10-CM

## 2022-12-10 DIAGNOSIS — R053 Chronic cough: Secondary | ICD-10-CM | POA: Diagnosis not present

## 2022-12-10 LAB — CBC WITH DIFFERENTIAL/PLATELET
Basophils Absolute: 0.1 10*3/uL (ref 0.0–0.1)
Basophils Relative: 1.4 % (ref 0.0–3.0)
Eosinophils Absolute: 0.4 10*3/uL (ref 0.0–0.7)
Eosinophils Relative: 5.4 % — ABNORMAL HIGH (ref 0.0–5.0)
HCT: 50.2 % (ref 39.0–52.0)
Hemoglobin: 17.1 g/dL — ABNORMAL HIGH (ref 13.0–17.0)
Lymphocytes Relative: 26.6 % (ref 12.0–46.0)
Lymphs Abs: 1.9 10*3/uL (ref 0.7–4.0)
MCHC: 34.1 g/dL (ref 30.0–36.0)
MCV: 100.2 fl — ABNORMAL HIGH (ref 78.0–100.0)
Monocytes Absolute: 0.5 10*3/uL (ref 0.1–1.0)
Monocytes Relative: 7.7 % (ref 3.0–12.0)
Neutro Abs: 4.2 10*3/uL (ref 1.4–7.7)
Neutrophils Relative %: 58.9 % (ref 43.0–77.0)
Platelets: 133 10*3/uL — ABNORMAL LOW (ref 150.0–400.0)
RBC: 5.01 Mil/uL (ref 4.22–5.81)
RDW: 14.8 % (ref 11.5–15.5)
WBC: 7.1 10*3/uL (ref 4.0–10.5)

## 2022-12-10 LAB — COMPREHENSIVE METABOLIC PANEL
ALT: 21 U/L (ref 0–53)
AST: 28 U/L (ref 0–37)
Albumin: 3.8 g/dL (ref 3.5–5.2)
Alkaline Phosphatase: 117 U/L (ref 39–117)
BUN: 14 mg/dL (ref 6–23)
CO2: 28 mEq/L (ref 19–32)
Calcium: 9.8 mg/dL (ref 8.4–10.5)
Chloride: 107 mEq/L (ref 96–112)
Creatinine, Ser: 0.62 mg/dL (ref 0.40–1.50)
GFR: 95.68 mL/min (ref >60.00)
Glucose, Bld: 118 mg/dL — ABNORMAL HIGH (ref 70–99)
Potassium: 3.8 mEq/L (ref 3.5–5.1)
Sodium: 143 mEq/L (ref 135–145)
Total Bilirubin: 3.7 mg/dL — ABNORMAL HIGH (ref 0.2–1.2)
Total Protein: 6.8 g/dL (ref 6.0–8.3)

## 2022-12-10 LAB — TSH: TSH: 1.85 u[IU]/mL (ref 0.35–5.50)

## 2022-12-10 LAB — HEMOGLOBIN A1C: Hgb A1c MFr Bld: 6.8 % — ABNORMAL HIGH (ref 4.6–6.5)

## 2022-12-10 MED ORDER — HYDROCODONE BIT-HOMATROP MBR 5-1.5 MG/5ML PO SOLN: 5.0000 mL | Freq: Four times a day (QID) | ORAL | 0 refills | Status: DC | PRN

## 2022-12-10 NOTE — Patient Instructions (Addendum)
    Have blood work done today     Medications changes include :   hycodan cough syrup      Return if symptoms worsen or fail to improve.

## 2022-12-18 ENCOUNTER — Ambulatory Visit (INDEPENDENT_AMBULATORY_CARE_PROVIDER_SITE_OTHER): Payer: Medicare Other

## 2022-12-18 ENCOUNTER — Other Ambulatory Visit: Payer: Self-pay | Admitting: Family

## 2022-12-18 ENCOUNTER — Other Ambulatory Visit: Payer: Self-pay

## 2022-12-18 ENCOUNTER — Other Ambulatory Visit: Payer: Self-pay | Admitting: Gastroenterology

## 2022-12-18 ENCOUNTER — Other Ambulatory Visit: Payer: Medicare Other

## 2022-12-18 DIAGNOSIS — K229 Disease of esophagus, unspecified: Secondary | ICD-10-CM

## 2022-12-18 DIAGNOSIS — Z23 Encounter for immunization: Secondary | ICD-10-CM

## 2022-12-18 DIAGNOSIS — B20 Human immunodeficiency virus [HIV] disease: Secondary | ICD-10-CM

## 2022-12-18 DIAGNOSIS — K2970 Gastritis, unspecified, without bleeding: Secondary | ICD-10-CM

## 2022-12-21 LAB — COMPLETE METABOLIC PANEL WITH GFR
AG Ratio: 1.4 (calc) (ref 1.0–2.5)
ALT: 22 U/L (ref 9–46)
AST: 33 U/L (ref 10–35)
Albumin: 3.5 g/dL — ABNORMAL LOW (ref 3.6–5.1)
Alkaline phosphatase (APISO): 132 U/L (ref 35–144)
BUN/Creatinine Ratio: 21 (calc) (ref 6–22)
BUN: 13 mg/dL (ref 7–25)
CO2: 28 mmol/L (ref 20–32)
Calcium: 9.2 mg/dL (ref 8.6–10.3)
Chloride: 105 mmol/L (ref 98–110)
Creat: 0.62 mg/dL — ABNORMAL LOW (ref 0.70–1.28)
Globulin: 2.5 g/dL (ref 1.9–3.7)
Glucose, Bld: 153 mg/dL — ABNORMAL HIGH (ref 65–99)
Potassium: 4 mmol/L (ref 3.5–5.3)
Sodium: 140 mmol/L (ref 135–146)
Total Bilirubin: 2.7 mg/dL — ABNORMAL HIGH (ref 0.2–1.2)
Total Protein: 6 g/dL — ABNORMAL LOW (ref 6.1–8.1)
eGFR: 102 mL/min/{1.73_m2} (ref >=60)

## 2022-12-21 LAB — HIV-1 RNA QUANT-NO REFLEX-BLD
HIV 1 RNA Quant: NOT DETECTED {copies}/mL
HIV-1 RNA Quant, Log: NOT DETECTED {Log}

## 2022-12-21 LAB — T-HELPER CELLS (CD4) COUNT (NOT AT ARMC)
Absolute CD4: 907 {cells}/uL (ref 490–1740)
CD4 T Helper %: 57 % (ref 30–61)
Total lymphocyte count: 1601 {cells}/uL (ref 850–3900)

## 2022-12-28 ENCOUNTER — Telehealth: Payer: Self-pay | Admitting: Pulmonary Disease

## 2022-12-28 NOTE — Telephone Encounter (Signed)
Patient would like refill for cough syrup. Pharmacy is [facility]. Patient phone number is [phone].

## 2022-12-28 NOTE — Telephone Encounter (Signed)
Please route to Dr. Isaiah Serge as this is a controlled substance. He is in office this afternoon. Thanks.

## 2022-12-29 MED ORDER — HYDROCODONE BIT-HOMATROP MBR 5-1.5 MG/5ML PO SOLN: 5.0000 mL | Freq: Four times a day (QID) | ORAL | 0 refills | Status: DC | PRN

## 2022-12-29 NOTE — Telephone Encounter (Signed)
Lm for patient to make him aware that we are awaiting approval.

## 2022-12-29 NOTE — Telephone Encounter (Signed)
Prescription has been called in and patient informed. Nothing further needed

## 2022-12-29 NOTE — Telephone Encounter (Signed)
Patient checking on message for cough syrup. Patient phone number is [phone]. 

## 2023-01-01 ENCOUNTER — Encounter: Payer: Self-pay | Admitting: Family

## 2023-01-01 ENCOUNTER — Ambulatory Visit: Payer: Medicare Other | Admitting: Family

## 2023-01-01 ENCOUNTER — Other Ambulatory Visit (HOSPITAL_COMMUNITY): Payer: Self-pay

## 2023-01-01 ENCOUNTER — Other Ambulatory Visit: Payer: Self-pay

## 2023-01-01 VITALS — BP 125/80 | HR 73 | Temp 97.7°F | Ht 72.0 in | Wt 199.2 lb

## 2023-01-01 DIAGNOSIS — B20 Human immunodeficiency virus [HIV] disease: Secondary | ICD-10-CM

## 2023-01-01 DIAGNOSIS — M79645 Pain in left finger(s): Secondary | ICD-10-CM | POA: Insufficient documentation

## 2023-01-01 DIAGNOSIS — Z0000 Encounter for general adult medical examination without abnormal findings: Secondary | ICD-10-CM

## 2023-01-01 MED ORDER — BIKTARVY 50-200-25 MG PO TABS
1.0000 | ORAL_TABLET | Freq: Every day | ORAL | 11 refills | Status: DC
Start: 2023-01-01 — End: 2024-01-05
  Filled 2023-01-01 (×2): qty 30, 30d supply, fill #0
  Filled 2023-02-03: qty 30, 30d supply, fill #1
  Filled 2023-03-01: qty 30, 30d supply, fill #2
  Filled 2023-03-31: qty 30, 30d supply, fill #3
  Filled 2023-05-31: qty 30, 30d supply, fill #4
  Filled 2023-07-09: qty 30, 30d supply, fill #5
  Filled 2023-08-10: qty 30, 30d supply, fill #6
  Filled 2023-09-09: qty 30, 30d supply, fill #7
  Filled 2023-10-05: qty 30, 30d supply, fill #8
  Filled 2023-10-28: qty 30, 30d supply, fill #9
  Filled 2023-12-13: qty 30, 30d supply, fill #10

## 2023-01-01 NOTE — Assessment & Plan Note (Signed)
Tyler Hendricks continues to have well controlled virus with good adherence and tolerance to [employer].  Reviewed lab work and discussed plan of care, U equals U, and family planning. Check lab work. Continue current dose of Biktarvy. Plan for follow up in  6 months or sooner if needed with lab work 1-2 weeks prior to appointment.

## 2023-01-01 NOTE — Progress Notes (Signed)
Brief Narrative   Patient ID: Tyler Hendricks, male    DOB: 10-07-1950, 72 y.o.   MRN: [id number]  Tyler Hendricks is a 72 y/o caucasian male diagnosed with HIV disease in 1989 with risk factor being IV drug use. Initial viral load is unknown with CD4 nadir of 510. No history of opportunistic infection. [id number] negative. Previous ART history with Zerit/Stauvadine-Epivir, Atripla, Complera, Odefsey and currently Biktarvy.     Subjective:    Chief Complaint  Patient presents with   Follow-up    HPI:  Tyler Hendricks is a 72 y.o. male with HIV disease last seen on 07/10/2022 with well-controlled virus and good adherence and tolerance to Biktarvy.  Viral load was undetectable with CD4 count 793.  Kidney function, liver function, electrolytes within normal ranges.  Most recent lab work completed on 12/18/2022 with viral load that remains undetectable and CD4 count 907.  Kidney function, liver function, electrolytes within normal ranges.  Here today for routine follow-up.  Tyler Hendricks has been doing well since his last office visit and continues to take Tyler Hendricks as prescribed with no adverse side effects or problems obtaining medication from pharmacy.  Has been having issues with his left index finger noticing cramping in his hand following yard work.  Does not occur at rest.  No current pain or tenderness.  No other trauma/injury.  Has not attempted any additional treatments.  Reviewed vaccinations.  Routine dental care up-to-date per recommendations.  Denies fevers, chills, night sweats, headaches, changes in vision, neck pain/stiffness, nausea, diarrhea, vomiting, lesions or rashes.  Lab Results  Component Value Date   CD4TCELL 57 12/18/2022   CD4TABS 789 11/24/2019   Lab Results  Component Value Date   HIV1RNAQUANT Not Detected 12/18/2022     Allergies  Allergen Reactions   Integrilin [Eptifibatide] Other (See Comments)    Bleeding (non-specific)   Lopressor [Metoprolol Tartrate] Rash and Other  (See Comments)    Bleeding (non-specific)    Penicillins Diarrhea and Rash      Outpatient Medications Prior to Visit  Medication Sig Dispense Refill   amLODipine (NORVASC) 2.5 MG tablet TAKE 1 TABLET BY MOUTH EVERY DAY 90 tablet 3   atenolol (TENORMIN) 50 MG tablet TAKE 1 TABLET BY MOUTH EVERY DAY 90 tablet 3   clopidogrel (PLAVIX) 75 MG tablet TAKE 1 TABLET BY MOUTH EVERY DAY 90 tablet 3   esomeprazole (NEXIUM) 40 MG capsule TAKE 1 CAPSULE (40 MG TOTAL) BY MOUTH DAILY. 90 capsule 1   HYDROcodone bit-homatropine (HYCODAN) 5-1.5 MG/5ML syrup Take 5 mLs by mouth every 6 (six) hours as needed (chronic cough - pulmonary fibrosis). 473 mL 0   losartan-hydrochlorothiazide (HYZAAR) 100-25 MG tablet Take 1 tablet by mouth daily. 90 tablet 3   Multiple Vitamin (MULTIVITAMIN) tablet Take 1 tablet by mouth daily.     Nintedanib (OFEV) 100 MG CAPS TAKE 1 CAPSULE BY MOUTH 2 TIMES A DAY WITH FOOD. TAKE 12 HOURS APART. 180 capsule 1   oxyCODONE (OXY IR/ROXICODONE) 5 MG immediate release tablet Take 1 tablet (5 mg total) by mouth every 4 (four) hours as needed for severe pain. 30 tablet 0   rosuvastatin (CRESTOR) 10 MG tablet Take 1 tablet (10 mg total) by mouth daily. 90 tablet 3   SYNJARDY XR 12.07-998 MG TB24 Take 2 tablets by mouth daily. 180 tablet 3   VASCEPA 1 g capsule Take 2 capsules (2 g total) by mouth 2 (two) times daily. 360 capsule 3   bictegravir-emtricitabine-tenofovir AF (BIKTARVY) 50-200-25  MG TABS tablet Take 1 tablet by mouth daily. 30 tablet 11   Oxycodone HCl 10 MG TABS Take 10 mg by mouth as needed (Pain). (Patient not taking: Reported on 01/01/2023)     No facility-administered medications prior to visit.     Past Medical History:  Diagnosis Date   Abnormal result of cardiovascular function study, unspecified    Atherosclerotic heart disease of native coronary artery without angina pectoris    CAD (coronary artery disease)    Coronary angioplasty status    Diabetes mellitus  without complication (HCC)    Essential (primary) hypertension    Gallstones    Hepatic cirrhosis (HCC)    Hepatitis B core antibody positive 01/08/2019   Hepatitis C    HIV infection (HCC)    Hyperlipidemia    Hypertension    Internal hemorrhoids    Kidney stones    Lung disease    Mixed hyperlipidemia    Palpitations    Sleep apnea    CPAP   Tubular adenoma of colon      Past Surgical History:  Procedure Laterality Date   CARDIAC CATHETERIZATION Left 04/2013   chlecystectomy     CHOLECYSTECTOMY     COLONOSCOPY  05/02/2013   GAS/FLUID EXCHANGE Right 12/30/2021   Procedure: GAS/FLUID EXCHANGE;  Surgeon: Carmela Rima, MD;  Location: [facility] OR;  Service: Ophthalmology;  Laterality: Right;  SF6   PARS PLANA VITRECTOMY Right 12/30/2021   Procedure: PARS PLANA VITRECTOMY WITH 25 GAUGE;  Surgeon: Carmela Rima, MD;  Location[facility] OR;  Service: Ophthalmology;  Laterality: Right;   PERCUTANEOUS CORONARY STENT INTERVENTION (PCI-S)     PHOTOCOAGULATION WITH LASER Right 12/30/2021   Procedure: PHOTOCOAGULATION WITH LASER;  Surgeon: Carmela Rima, MD;  Location: [facility] OR;  Service: Ophthalmology;  Laterality: Right;   POLYPECTOMY     UMBILICAL HERNIA REPAIR        Review of Systems  Constitutional:  Negative for appetite change, chills, fatigue, fever and unexpected weight change.  Eyes:  Negative for visual disturbance.  Respiratory:  Negative for cough, chest tightness, shortness of breath and wheezing.   Cardiovascular:  Negative for chest pain and leg swelling.  Gastrointestinal:  Negative for abdominal pain, constipation, diarrhea, nausea and vomiting.  Genitourinary:  Negative for dysuria, flank pain, frequency, genital sores, hematuria and urgency.  Skin:  Negative for rash.  Allergic/Immunologic: Negative for immunocompromised state.  Neurological:  Negative for dizziness and headaches.      Objective:    BP 125/80   Pulse 73   Temp 97.7 F (36.5 C) (Temporal)   Ht  6' (1.829 m)   Wt 199 lb 3.2 oz (90.4 kg)   SpO2 93%   BMI 27.02 kg/m  Nursing note and vital signs reviewed.  Physical Exam Constitutional:      General: He is not in acute distress.    Appearance: He is well-developed.  Eyes:     Conjunctiva/sclera: Conjunctivae normal.  Cardiovascular:     Rate and Rhythm: Normal rate and regular rhythm.     Heart sounds: Normal heart sounds. No murmur heard.    No friction rub. No gallop.  Pulmonary:     Effort: Pulmonary effort is normal. No respiratory distress.     Breath sounds: Normal breath sounds. No wheezing or rales.  Chest:     Chest wall: No tenderness.  Abdominal:     General: Bowel sounds are normal.     Palpations: Abdomen is soft.  Tenderness: There is no abdominal tenderness.  Musculoskeletal:     Cervical back: Neck supple.  Lymphadenopathy:     Cervical: No cervical adenopathy.  Skin:    General: Skin is warm and dry.     Findings: No rash.  Neurological:     Mental Status: He is alert and oriented to person, place, and time.  Psychiatric:        Behavior: Behavior normal.        Thought Content: Thought content normal.        Judgment: Judgment normal.         01/01/2023    9:30 AM 07/10/2022    9:29 AM 05/06/2022    1:38 PM 01/02/2022    9:39 AM 12/22/2021    9:25 AM  Depression screen PHQ 2/9  Decreased Interest 0 0 0 0 0  Down, Depressed, Hopeless 0 0 0 0 0  PHQ - 2 Score 0 0 0 0 0       Assessment & Plan:    Patient Active Problem List   Diagnosis Date Noted   Pain of finger of left hand 01/01/2023   Pain due to onychomycosis of toenails of both feet 10/08/2022   Degeneration of lumbar intervertebral disc 02/18/2022   Long-term current use of opiate analgesic 06/11/2020   Lumbar pain 04/24/2020   Degenerative lumbar spinal stenosis 04/24/2020   Scoliosis deformity of spine 04/24/2020   Neural foraminal stenosis of cervical spine 01/03/2020   Chronic chest wall pain 11/16/2019   Routine  general medical examination at a health care facility 11/16/2019   Hyperlipidemia associated with type 2 diabetes mellitus (HCC) 11/15/2019   Benign prostatic hyperplasia without lower urinary tract symptoms 11/15/2019   Interstitial pulmonary disease (HCC) 07/18/2019   Chronic bilateral low back pain without sciatica 03/02/2019   Muscle cramps 03/02/2019   Cirrhosis of liver without ascites (HCC) 01/08/2019   Secondary esophageal varices without bleeding (HCC) 01/08/2019   Intestinal metaplasia of gastric mucosa 01/08/2019   Chronic cough 06/24/2018   Healthcare maintenance 05/11/2018   Hx of colonic polyp 04/27/2018   Ventral hernia without obstruction or gangrene 04/27/2018   Thrombocytopenia (HCC) 04/27/2018   Hypersplenism 04/27/2018   Coronary artery disease without angina pectoris 02/25/2018   Obstructive sleep apnea 10/25/2017   Extrinsic asthma without complication 08/25/2017   Vitamin D deficiency 08/25/2017   HIV disease (HCC) 04/15/2017   Primary osteoarthritis of both knees 04/15/2017   Type 2 diabetes with complication (HCC) 04/15/2017   Essential hypertension 04/15/2017     Problem List Items Addressed This Visit       Other   HIV disease (HCC) - Primary    Tyler Hendricks continues to have well controlled virus with good adherence and tolerance to [employer].  Reviewed lab work and discussed plan of care, U equals U, and family planning. Check lab work. Continue current dose of Biktarvy. Plan for follow up in  6 months or sooner if needed with lab work 1-2 weeks prior to appointment.        Relevant Medications   bictegravir-emtricitabine-tenofovir AF (BIKTARVY) 50-200-25 MG TABS tablet   Healthcare maintenance    Discussed importance of safe sexual practice and condom use. Condoms and STD testing offered.  Routine vaccinations up to date per recommendations Colon cancer screening and dental care up to date.       Pain of finger of left hand    Tyler Hendricks has acute onset  pain in his left index finger  primarily exacerbated following yard work using a [employer]. Suspect underlying arthritis given history and presentation. Treat conservatively with ice as needed following activity and consider diclofenac gel for the area. If symptoms worsen or do not improve may need imaging and/or further evaluation.         I am having Tyler Hendricks maintain his multivitamin, Oxycodone HCl, Ofev, atenolol, amLODipine, losartan-hydrochlorothiazide, rosuvastatin, Synjardy XR, Vascepa, oxyCODONE, clopidogrel, esomeprazole, HYDROcodone bit-homatropine, and Biktarvy.   Meds ordered this encounter  Medications   bictegravir-emtricitabine-tenofovir AF (BIKTARVY) 50-200-25 MG TABS tablet    Sig: Take 1 tablet by mouth daily.    Dispense:  30 tablet    Refill:  11    Order Specific Question:   Supervising Provider    Answer:   Judyann Munson [[id number]]     Follow-up: Return in about 6 months (around 07/02/2023). or sooner if needed.    Marcos Eke, MSN, FNP-C Nurse Practitioner [facility] Main number: [phone]

## 2023-01-01 NOTE — Assessment & Plan Note (Signed)
Tyler Hendricks has acute onset pain in his left index finger primarily exacerbated following yard work using a [employer]. Suspect underlying arthritis given history and presentation. Treat conservatively with ice as needed following activity and consider diclofenac gel for the area. If symptoms worsen or do not improve may need imaging and/or further evaluation.

## 2023-01-01 NOTE — Patient Instructions (Addendum)
Nice to see you.  Continue to take your medication daily as prescribed.  Refills have been sent to the pharmacy.  Plan for follow up in 6 months or sooner if needed with lab work 1-2 weeks prior to appointment.   Have a great day and stay safe!  

## 2023-01-01 NOTE — Progress Notes (Signed)
Specialty Pharmacy Refill Coordination Note  Tyler Hendricks is a 72 y.o. male contacted today regarding refills of specialty medication(s) Bictegravir-Emtricitab-Tenofov   Patient requested Delivery   Delivery date: 01/06/23   Verified address: [address], [place], [address]   Medication will be filled on 01/05/23.

## 2023-01-01 NOTE — Assessment & Plan Note (Signed)
Discussed importance of safe sexual practice and condom use. Condoms and STD testing offered.  Routine vaccinations up to date per recommendations Colon cancer screening and dental care up to date.

## 2023-01-05 ENCOUNTER — Other Ambulatory Visit: Payer: Self-pay

## 2023-01-19 ENCOUNTER — Telehealth: Payer: Self-pay | Admitting: Pulmonary Disease

## 2023-01-19 NOTE — Telephone Encounter (Signed)
HYDROcodone bit-homatropine (HYCODAN) 5-1.5 MG/5ML syrup  Pharmacy: [id number] [place],  - [address]

## 2023-01-20 MED ORDER — HYDROCODONE BIT-HOMATROP MBR 5-1.5 MG/5ML PO SOLN: 5.0000 mL | Freq: Four times a day (QID) | ORAL | 0 refills | Status: DC | PRN

## 2023-01-20 NOTE — Telephone Encounter (Signed)
Pt requesting cough medication please advise

## 2023-01-20 NOTE — Telephone Encounter (Signed)
Patient checking on message for cough syrup. Patient phone number is [phone]. 

## 2023-01-20 NOTE — Telephone Encounter (Signed)
The medication refill has been take sent in. Patient informed. Nothing further needed

## 2023-01-26 ENCOUNTER — Ambulatory Visit: Payer: Medicare Other | Admitting: Interventional Cardiology

## 2023-01-28 ENCOUNTER — Other Ambulatory Visit (HOSPITAL_COMMUNITY): Payer: Self-pay

## 2023-02-01 ENCOUNTER — Other Ambulatory Visit: Payer: Self-pay

## 2023-02-02 DIAGNOSIS — H35363 Drusen (degenerative) of macula, bilateral: Secondary | ICD-10-CM | POA: Diagnosis not present

## 2023-02-02 DIAGNOSIS — H35371 Puckering of macula, right eye: Secondary | ICD-10-CM | POA: Diagnosis not present

## 2023-02-02 DIAGNOSIS — H353131 Nonexudative age-related macular degeneration, bilateral, early dry stage: Secondary | ICD-10-CM | POA: Diagnosis not present

## 2023-02-02 DIAGNOSIS — H2512 Age-related nuclear cataract, left eye: Secondary | ICD-10-CM | POA: Diagnosis not present

## 2023-02-03 ENCOUNTER — Other Ambulatory Visit: Payer: Self-pay

## 2023-02-03 NOTE — Progress Notes (Signed)
Specialty Pharmacy Refill Coordination Note  Tyler Hendricks is a 72 y.o. male contacted today regarding refills of specialty medication(s) Bictegravir-Emtricitab-Tenofov   Patient requested Delivery   Delivery date: 02/10/23   Verified address: [address] [place] [address]   Medication will be filled on 02/09/23.

## 2023-02-09 ENCOUNTER — Other Ambulatory Visit (HOSPITAL_COMMUNITY): Payer: Self-pay

## 2023-02-09 ENCOUNTER — Other Ambulatory Visit: Payer: Self-pay

## 2023-02-11 ENCOUNTER — Other Ambulatory Visit: Payer: Self-pay

## 2023-02-11 DIAGNOSIS — M51362 Other intervertebral disc degeneration, lumbar region with discogenic back pain and lower extremity pain: Secondary | ICD-10-CM | POA: Diagnosis not present

## 2023-02-11 DIAGNOSIS — Z5181 Encounter for therapeutic drug level monitoring: Secondary | ICD-10-CM | POA: Diagnosis not present

## 2023-02-11 DIAGNOSIS — H0102A Squamous blepharitis right eye, upper and lower eyelids: Secondary | ICD-10-CM | POA: Diagnosis not present

## 2023-02-11 DIAGNOSIS — Z79899 Other long term (current) drug therapy: Secondary | ICD-10-CM | POA: Diagnosis not present

## 2023-02-11 DIAGNOSIS — E119 Type 2 diabetes mellitus without complications: Secondary | ICD-10-CM | POA: Diagnosis not present

## 2023-02-11 DIAGNOSIS — H0102B Squamous blepharitis left eye, upper and lower eyelids: Secondary | ICD-10-CM | POA: Diagnosis not present

## 2023-02-11 DIAGNOSIS — H33001 Unspecified retinal detachment with retinal break, right eye: Secondary | ICD-10-CM | POA: Diagnosis not present

## 2023-02-11 DIAGNOSIS — M48061 Spinal stenosis, lumbar region without neurogenic claudication: Secondary | ICD-10-CM | POA: Diagnosis not present

## 2023-02-11 DIAGNOSIS — S0501XA Injury of conjunctiva and corneal abrasion without foreign body, right eye, initial encounter: Secondary | ICD-10-CM | POA: Diagnosis not present

## 2023-02-11 DIAGNOSIS — H35033 Hypertensive retinopathy, bilateral: Secondary | ICD-10-CM | POA: Diagnosis not present

## 2023-02-11 DIAGNOSIS — H2512 Age-related nuclear cataract, left eye: Secondary | ICD-10-CM | POA: Diagnosis not present

## 2023-02-11 DIAGNOSIS — Z961 Presence of intraocular lens: Secondary | ICD-10-CM | POA: Diagnosis not present

## 2023-02-11 DIAGNOSIS — H0288A Meibomian gland dysfunction right eye, upper and lower eyelids: Secondary | ICD-10-CM | POA: Diagnosis not present

## 2023-02-11 DIAGNOSIS — Z79891 Long term (current) use of opiate analgesic: Secondary | ICD-10-CM | POA: Diagnosis not present

## 2023-02-11 DIAGNOSIS — H353132 Nonexudative age-related macular degeneration, bilateral, intermediate dry stage: Secondary | ICD-10-CM | POA: Diagnosis not present

## 2023-02-11 DIAGNOSIS — H0288B Meibomian gland dysfunction left eye, upper and lower eyelids: Secondary | ICD-10-CM | POA: Diagnosis not present

## 2023-02-11 DIAGNOSIS — M5416 Radiculopathy, lumbar region: Secondary | ICD-10-CM | POA: Diagnosis not present

## 2023-02-11 DIAGNOSIS — H04123 Dry eye syndrome of bilateral lacrimal glands: Secondary | ICD-10-CM | POA: Diagnosis not present

## 2023-02-12 ENCOUNTER — Encounter: Payer: Self-pay | Admitting: Podiatry

## 2023-02-12 ENCOUNTER — Ambulatory Visit (INDEPENDENT_AMBULATORY_CARE_PROVIDER_SITE_OTHER): Payer: Medicare Other | Admitting: Podiatry

## 2023-02-12 DIAGNOSIS — M79674 Pain in right toe(s): Secondary | ICD-10-CM

## 2023-02-12 DIAGNOSIS — B351 Tinea unguium: Secondary | ICD-10-CM

## 2023-02-12 DIAGNOSIS — E118 Type 2 diabetes mellitus with unspecified complications: Secondary | ICD-10-CM

## 2023-02-12 DIAGNOSIS — M79675 Pain in left toe(s): Secondary | ICD-10-CM

## 2023-02-12 NOTE — Progress Notes (Signed)
This patient returns to my office for at risk foot care.  This patient requires this care by a professional since this patient will be at risk due to having diabetes and HIV.  This patient is unable to cut nails himself since the patient cannot reach his nails.These nails are painful walking and wearing shoes.  This patient presents for at risk foot care today.  General Appearance  Alert, conversant and in no acute stress.  Vascular  Dorsalis pedis and posterior tibial  pulses are palpable  bilaterally.  Capillary return is within normal limits  bilaterally. Temperature is within normal limits  bilaterally.  Neurologic  Senn-Weinstein monofilament wire test within normal limits  bilaterally. Muscle power within normal limits bilaterally.  Nails Thick disfigured discolored nails with subungual debris  hallux nails bilaterally. No evidence of bacterial infection or drainage bilaterally.  Orthopedic  No limitations of motion  feet .  No crepitus or effusions noted.  No bony pathology or digital deformities noted.  Skin  normotropic skin with no porokeratosis noted bilaterally.  No signs of infections or ulcers noted.     Onychomycosis  Pain in right toes  Pain in left toes  Consent was obtained for treatment procedures.   Mechanical debridement of nails 1-5  bilaterally performed with a nail nipper.  Filed with dremel without incident.    Return office visit   4 months                   Told patient to return for periodic foot care and evaluation due to potential at risk complications.   Helane Gunther DPM

## 2023-02-15 ENCOUNTER — Telehealth: Payer: Self-pay | Admitting: Pulmonary Disease

## 2023-02-15 ENCOUNTER — Encounter: Payer: Self-pay | Admitting: Cardiology

## 2023-02-15 ENCOUNTER — Ambulatory Visit: Payer: Medicare Other | Attending: Interventional Cardiology | Admitting: Cardiology

## 2023-02-15 VITALS — BP 120/62 | HR 62 | Ht 72.0 in | Wt 196.2 lb

## 2023-02-15 DIAGNOSIS — R0989 Other specified symptoms and signs involving the circulatory and respiratory systems: Secondary | ICD-10-CM | POA: Diagnosis not present

## 2023-02-15 DIAGNOSIS — I25118 Atherosclerotic heart disease of native coronary artery with other forms of angina pectoris: Secondary | ICD-10-CM | POA: Diagnosis not present

## 2023-02-15 DIAGNOSIS — I350 Nonrheumatic aortic (valve) stenosis: Secondary | ICD-10-CM | POA: Diagnosis not present

## 2023-02-15 NOTE — Progress Notes (Signed)
Cardiology Office Note:  .   Date:  02/15/2023  ID:  Sheppard Evens, DOB 02/12/1951, MRN [id number] PCP: Myrlene Broker, MD  [facility] Providers Cardiologist:  Truett Mainland, MD PCP: Myrlene Broker, MD  Chief Complaint  Patient presents with   Coronary Artery Disease      History of Present Illness: .    Tyler Hendricks is a 72 y.o. male with hypertension, hyperlipidemia,  type 2 diabetes mellitus, CAD (multiple prior PCI's), aortic stenosis, pulmonary fibrosis,  hep B cirrhosis, HIV  Prior stents: OM1 3.0 x 23 mm, left posterolateral 2.5 x 13 mm, diagonal 1 2.5 x 13, ostial diagonal 2.5 mm, proximal LAD- unknown size, mid LAD 3.0 x 13  - all BMS.    Patient has exertional dyspnea after walking about a mile.  He has regular follow-up with Dr. Isaiah Serge who is treating him for pulmonary fibrosis.  He denies any chest pain.  He is compliant with his medical therapy.  Vitals:   02/15/23 1538  BP: 120/62  Pulse: 62  SpO2: 97%     ROS:  Review of Systems  Cardiovascular:  Positive for dyspnea on exertion. Negative for chest pain, leg swelling, palpitations and syncope.  Respiratory:  Positive for cough.      Studies Reviewed: Marland Kitchen        EKG 02/15/2023: Sinus rhythm with occasional Premature ventricular complexes Right bundle branch block Left anterior fascicular block Bifascicular block Minimal voltage criteria for LVH, may be normal variant ( R in aVL ) No previous ECGs available     Independently interpreted Labs 10/2022: Cr 0.6, K 4.0 Chol 142, TG 68, HDL 70, LDL 58 TSH 1.8  Echocardiogram 09/12/2022: 1. Left ventricular ejection fraction, by estimation, is 60 to 65%. The  left ventricle has normal function. The left ventricle has no regional  wall motion abnormalities. Left ventricular diastolic parameters are  consistent with Grade I diastolic  dysfunction (impaired relaxation). Elevated left atrial pressure.   2. Right ventricular  systolic function is normal. The right ventricular  size is normal.   3. The mitral valve is normal in structure. Trivial mitral valve  regurgitation. No evidence of mitral stenosis.   4. The aortic valve is calcified. Aortic valve regurgitation is trivial.  Moderate aortic valve stenosis. Aortic valve mean gradient measures 20.7 mmHg.  Aortic valve peak gradient measures 36.8 mmHg. Aortic valve area, by VTI measures 1.28 cm.   5. The inferior vena cava is normal in size with greater than 50%  respiratory variability, suggesting right atrial pressure of 3 mmHg.   Physical Exam:   Physical Exam Vitals and nursing note reviewed.  Constitutional:      General: He is not in acute distress. Neck:     Vascular: No JVD.  Cardiovascular:     Rate and Rhythm: Normal rate and regular rhythm.     Pulses:          Carotid pulses are  on the right side with bruit.    Heart sounds: Murmur heard.     High-pitched blowing holosystolic murmur is present at the apex.     High-pitched blowing holosystolic murmur of grade 2/6 is also present at the apex.  Pulmonary:     Effort: Pulmonary effort is normal.     Breath sounds: Normal breath sounds. No wheezing or rales.  Musculoskeletal:     Right lower leg: No edema.     Left lower leg: No edema.  VISIT DIAGNOSES:   ICD-10-CM   1. Coronary artery disease of native artery of native heart with stable angina pectoris (HCC)  I25.118 EKG 12-Lead    2. Nonrheumatic aortic valve stenosis  I35.0 EKG 12-Lead    3. Bruit of right carotid artery  R09.89        ASSESSMENT AND PLAN: .    Tyler Hendricks is a 72 y.o. male with hypertension, hyperlipidemia, hep B cirrhosis, type 2 diabetes mellitus, CAD (multiple prior PCI's), aortic stenosis  Exertional dyspnea: Along with his cough, more likely to be due to pulmonary fibrosis.  Low suspicion for angina equivalent at this time.  Continue follow with pulmonology  CAD: No angina  symptoms. Tolerating Plavix monotherapy, continue the same. Lipids well-controlled.,  continue statin  Aortic stenosis:  Appears moderate by exam, will obtain echocardiogram.  Carotid bruit: Could be conducted murmur, but will obtain carotid duplex ultrasound.    F/u in 6 months  Signed, Elder Negus, MD

## 2023-02-15 NOTE — Telephone Encounter (Signed)
HYDROcodone bit-homatropine (HYCODAN) 5-1.5 MG/5ML syrup  [id number] - [place], [place] - [address]

## 2023-02-15 NOTE — Telephone Encounter (Signed)
HYDROcodone bit-homatropine (HYCODAN) 5-1.5 MG/5ML syrup  [id number] - [place], [place] - [address]

## 2023-02-15 NOTE — Patient Instructions (Signed)
Medication Instructions:   Your physician recommends that you continue on your current medications as directed. Please refer to the Current Medication list given to you today.  *If you need a refill on your cardiac medications before your next appointment, please call your pharmacy*   Testing/Procedures:  Your physician has requested that you have an echocardiogram. Echocardiography is a painless test that uses sound waves to create images of your heart. It provides your doctor with information about the size and shape of your heart and how well your heart's chambers and valves are working. This procedure takes approximately one hour. There are no restrictions for this procedure. Please do NOT wear cologne, perfume, aftershave, or lotions (deodorant is allowed). Please arrive 15 minutes prior to your appointment time.  Please note: We ask at that you not bring children with you during ultrasound (echo/ vascular) testing. Due to room size and safety concerns, children are not allowed in the ultrasound rooms during exams. Our front office staff cannot provide observation of children in our lobby area while testing is being conducted. An adult accompanying a patient to their appointment will only be allowed in the ultrasound room at the discretion of the ultrasound technician under special circumstances. We apologize for any inconvenience.   Your physician has requested that you have a carotid duplex. This test is an ultrasound of the carotid arteries in your neck. It looks at blood flow through these arteries that supply the brain with blood. Allow one hour for this exam. There are no restrictions or special instructions.    Follow-Up: At [facility], you and your health needs are our priority.  As part of our continuing mission to provide you with exceptional heart care, we have created designated Provider Care Teams.  These Care Teams include your primary Cardiologist (physician) and  Advanced Practice Providers (APPs -  Physician Assistants and Nurse Practitioners) who all work together to provide you with the care you need, when you need it.  We recommend signing up for the patient portal called "MyChart".  Sign up information is provided on this After Visit Summary.  MyChart is used to connect with patients for Virtual Visits (Telemedicine).  Patients are able to view lab/test results, encounter notes, upcoming appointments, etc.  Non-urgent messages can be sent to your provider as well.   To learn more about what you can do with MyChart, go to [email or web address].    Your next appointment:   6 month(s)  Provider:   DR. Rosemary Holms

## 2023-02-16 MED ORDER — HYDROCODONE BIT-HOMATROP MBR 5-1.5 MG/5ML PO SOLN: 5.0000 mL | Freq: Four times a day (QID) | ORAL | 0 refills | Status: DC | PRN

## 2023-02-16 NOTE — Telephone Encounter (Signed)
Medication has been refilled. Left a voice mail for patient. Nothing further needed

## 2023-02-24 ENCOUNTER — Other Ambulatory Visit: Payer: Self-pay | Admitting: Pulmonary Disease

## 2023-02-24 DIAGNOSIS — J84112 Idiopathic pulmonary fibrosis: Secondary | ICD-10-CM

## 2023-02-24 NOTE — Telephone Encounter (Signed)
Refill sent for OFEV to [facility] (pulmonary fibrosis team): [phone]  Dose: 100mg  twice daily  Last OV: 08/28/2022 Provider: Dr. Isaiah Serge Pertinent labs: LFTs on 12/18/22 wnl  Next OV: 04/05/2023  Routing to scheduling team for follow-up on appt scheduling  Chesley Mires, PharmD, MPH, BCPS Clinical Pharmacist (Rheumatology and Pulmonology)

## 2023-03-01 ENCOUNTER — Other Ambulatory Visit: Payer: Self-pay

## 2023-03-01 ENCOUNTER — Other Ambulatory Visit (HOSPITAL_COMMUNITY): Payer: Self-pay

## 2023-03-01 NOTE — Progress Notes (Signed)
Specialty Pharmacy Refill Coordination Note  Tyler Hendricks is a 72 y.o. male contacted today regarding refills of specialty medication(s) Bictegravir-Emtricitab-Tenofov   Patient requested Delivery   Delivery date: 03/12/23   Verified address: [address]   Medication will be filled on 03/11/23.

## 2023-03-02 ENCOUNTER — Ambulatory Visit (HOSPITAL_COMMUNITY)
Admission: RE | Admit: 2023-03-02 | Discharge: 2023-03-02 | Disposition: A | Discharge status: Home / Self Care | Payer: Medicare Other | Source: Ambulatory Visit | Attending: Cardiovascular Disease | Admitting: Cardiovascular Disease

## 2023-03-02 DIAGNOSIS — R0989 Other specified symptoms and signs involving the circulatory and respiratory systems: Secondary | ICD-10-CM | POA: Diagnosis not present

## 2023-03-15 ENCOUNTER — Telehealth: Payer: Self-pay | Admitting: Pulmonary Disease

## 2023-03-15 MED ORDER — HYDROCODONE BIT-HOMATROP MBR 5-1.5 MG/5ML PO SOLN: 5.0000 mL | Freq: Four times a day (QID) | ORAL | 0 refills | Status: DC | PRN

## 2023-03-15 NOTE — Telephone Encounter (Signed)
Refill sent. Voice message left on patient phone. Nothing further needed

## 2023-03-15 NOTE — Telephone Encounter (Signed)
Pt calling in for a refill for his HYDROcodone bit-homatropine (HYCODAN) 5-1.5 MG/5ML syrup   [id number] [place], [place] - [address]

## 2023-03-15 NOTE — Telephone Encounter (Signed)
PT calling again for his cough syrup.

## 2023-03-19 DIAGNOSIS — H2512 Age-related nuclear cataract, left eye: Secondary | ICD-10-CM | POA: Diagnosis not present

## 2023-03-19 DIAGNOSIS — H35033 Hypertensive retinopathy, bilateral: Secondary | ICD-10-CM | POA: Diagnosis not present

## 2023-03-19 DIAGNOSIS — H33001 Unspecified retinal detachment with retinal break, right eye: Secondary | ICD-10-CM | POA: Diagnosis not present

## 2023-03-19 DIAGNOSIS — H0288A Meibomian gland dysfunction right eye, upper and lower eyelids: Secondary | ICD-10-CM | POA: Diagnosis not present

## 2023-03-19 DIAGNOSIS — H0288B Meibomian gland dysfunction left eye, upper and lower eyelids: Secondary | ICD-10-CM | POA: Diagnosis not present

## 2023-03-19 DIAGNOSIS — S0501XD Injury of conjunctiva and corneal abrasion without foreign body, right eye, subsequent encounter: Secondary | ICD-10-CM | POA: Diagnosis not present

## 2023-03-19 DIAGNOSIS — H0102B Squamous blepharitis left eye, upper and lower eyelids: Secondary | ICD-10-CM | POA: Diagnosis not present

## 2023-03-19 DIAGNOSIS — Z961 Presence of intraocular lens: Secondary | ICD-10-CM | POA: Diagnosis not present

## 2023-03-19 DIAGNOSIS — H04123 Dry eye syndrome of bilateral lacrimal glands: Secondary | ICD-10-CM | POA: Diagnosis not present

## 2023-03-19 DIAGNOSIS — E119 Type 2 diabetes mellitus without complications: Secondary | ICD-10-CM | POA: Diagnosis not present

## 2023-03-19 DIAGNOSIS — H0102A Squamous blepharitis right eye, upper and lower eyelids: Secondary | ICD-10-CM | POA: Diagnosis not present

## 2023-03-25 ENCOUNTER — Ambulatory Visit: Payer: Medicare Other | Admitting: Pulmonary Disease

## 2023-03-25 ENCOUNTER — Ambulatory Visit
Admission: RE | Admit: 2023-03-25 | Discharge: 2023-03-25 | Disposition: A | Discharge status: Home / Self Care | Payer: Medicare Other | Source: Ambulatory Visit | Attending: Pulmonary Disease | Admitting: Pulmonary Disease

## 2023-03-25 ENCOUNTER — Ambulatory Visit (HOSPITAL_COMMUNITY): Payer: Medicare Other | Attending: Cardiology

## 2023-03-25 DIAGNOSIS — J84112 Idiopathic pulmonary fibrosis: Secondary | ICD-10-CM

## 2023-03-25 DIAGNOSIS — I700 Atherosclerosis of aorta: Secondary | ICD-10-CM | POA: Diagnosis not present

## 2023-03-25 DIAGNOSIS — J849 Interstitial pulmonary disease, unspecified: Secondary | ICD-10-CM | POA: Diagnosis not present

## 2023-03-25 DIAGNOSIS — I2510 Atherosclerotic heart disease of native coronary artery without angina pectoris: Secondary | ICD-10-CM | POA: Diagnosis not present

## 2023-03-25 DIAGNOSIS — I350 Nonrheumatic aortic (valve) stenosis: Secondary | ICD-10-CM | POA: Insufficient documentation

## 2023-03-25 LAB — PULMONARY FUNCTION TEST
DL/VA % pred: 78 %
DL/VA: 3.12 ml/min/mmHg/L
DLCO cor % pred: 55 %
DLCO cor: 15.22 ml/min/mmHg
DLCO unc % pred: 55 %
DLCO unc: 15.22 ml/min/mmHg
FEF 25-75 Post: 3.37 L/s
FEF 25-75 Pre: 2.28 L/s
FEF2575-%Change-Post: 48 %
FEF2575-%Pred-Post: 131 %
FEF2575-%Pred-Pre: 88 %
FEV1-%Change-Post: 7 %
FEV1-%Pred-Post: 83 %
FEV1-%Pred-Pre: 78 %
FEV1-Post: 2.89 L
FEV1-Pre: 2.7 L
FEV1FVC-%Change-Post: 3 %
FEV1FVC-%Pred-Pre: 105 %
FEV6-%Change-Post: 3 %
FEV6-%Pred-Post: 81 %
FEV6-%Pred-Pre: 78 %
FEV6-Post: 3.61 L
FEV6-Pre: 3.49 L
FEV6FVC-%Change-Post: 0 %
FEV6FVC-%Pred-Post: 105 %
FEV6FVC-%Pred-Pre: 105 %
FVC-%Change-Post: 3 %
FVC-%Pred-Post: 76 %
FVC-%Pred-Pre: 74 %
FVC-Post: 3.61 L
FVC-Pre: 3.5 L
Post FEV1/FVC ratio: 80 %
Post FEV6/FVC ratio: 100 %
Pre FEV1/FVC ratio: 77 %
Pre FEV6/FVC Ratio: 100 %
RV % pred: 47 %
RV: 1.22 L
TLC % pred: 62 %
TLC: 4.62 L

## 2023-03-25 LAB — ECHOCARDIOGRAM COMPLETE
AR max vel: 0.82 cm2
AV Area VTI: 0.76 cm2
AV Area mean vel: 0.79 cm2
AV Mean grad: 31 mm[Hg]
AV Peak grad: 52.7 mm[Hg]
Ao pk vel: 3.63 m/s
Area-P 1/2: 3.25 cm2
P 1/2 time: 329 ms
S' Lateral: 2.9 cm

## 2023-03-25 NOTE — Patient Instructions (Signed)
 Full PFT performed today.

## 2023-03-25 NOTE — Progress Notes (Signed)
 Full PFT performed today.

## 2023-03-29 ENCOUNTER — Telehealth: Payer: Self-pay | Admitting: Pulmonary Disease

## 2023-03-29 DIAGNOSIS — H2512 Age-related nuclear cataract, left eye: Secondary | ICD-10-CM | POA: Diagnosis not present

## 2023-03-29 NOTE — Telephone Encounter (Signed)
 Called and spoke with patient.  Patient stated cough has gotten worse. Advised patient I could send a message to Dr. Theophilus requesting alternative medications to treat cough or schedule OV. Patient declined at this time.  Advised patient to call back if needed. Nothing further at this time.

## 2023-03-29 NOTE — Telephone Encounter (Signed)
 Patient states needs refill for cough syrup. Pharmacy is [facility] [address]. Patient phone number is [phone].

## 2023-03-29 NOTE — Telephone Encounter (Signed)
 His last refill was 12/23 and is not due for a refill till the end of the month

## 2023-03-29 NOTE — Telephone Encounter (Signed)
 Patient called requesting a refill for his Hycodan cough medication to be [facility] to [facility].   Last prescription 03/15/23 #445ml, no refills   Message routed to Dr. Isaiah Serge to advise

## 2023-03-31 ENCOUNTER — Other Ambulatory Visit: Payer: Self-pay

## 2023-03-31 NOTE — Progress Notes (Signed)
 Specialty Pharmacy Refill Coordination Note  Tyler Hendricks is a 73 y.o. male contacted today regarding refills of specialty medication(s) Bictegravir-Emtricitab-Tenofov (Biktarvy ) Spoke with patient's wife - Darice  Patient requested Delivery   Delivery date: 04/22/23   Verified address: [address]   Medication will be filled on 01.29.25.

## 2023-04-01 DIAGNOSIS — H2512 Age-related nuclear cataract, left eye: Secondary | ICD-10-CM | POA: Diagnosis not present

## 2023-04-05 ENCOUNTER — Ambulatory Visit: Payer: Medicare Other | Admitting: Pulmonary Disease

## 2023-04-05 ENCOUNTER — Other Ambulatory Visit: Payer: Self-pay | Admitting: Pulmonary Disease

## 2023-04-05 ENCOUNTER — Encounter: Payer: Self-pay | Admitting: Internal Medicine

## 2023-04-05 ENCOUNTER — Encounter: Payer: Self-pay | Admitting: Pulmonary Disease

## 2023-04-05 VITALS — BP 133/86 | HR 65 | Ht 72.0 in | Wt 197.0 lb

## 2023-04-05 DIAGNOSIS — R634 Abnormal weight loss: Secondary | ICD-10-CM

## 2023-04-05 DIAGNOSIS — Z5181 Encounter for therapeutic drug level monitoring: Secondary | ICD-10-CM

## 2023-04-05 DIAGNOSIS — J84112 Idiopathic pulmonary fibrosis: Secondary | ICD-10-CM

## 2023-04-05 DIAGNOSIS — G4733 Obstructive sleep apnea (adult) (pediatric): Secondary | ICD-10-CM | POA: Diagnosis not present

## 2023-04-05 DIAGNOSIS — Z91199 Patient's noncompliance with other medical treatment and regimen due to unspecified reason: Secondary | ICD-10-CM | POA: Diagnosis not present

## 2023-04-05 DIAGNOSIS — K2970 Gastritis, unspecified, without bleeding: Secondary | ICD-10-CM

## 2023-04-05 DIAGNOSIS — J849 Interstitial pulmonary disease, unspecified: Secondary | ICD-10-CM

## 2023-04-05 DIAGNOSIS — K229 Disease of esophagus, unspecified: Secondary | ICD-10-CM

## 2023-04-05 DIAGNOSIS — J8410 Pulmonary fibrosis, unspecified: Secondary | ICD-10-CM

## 2023-04-05 DIAGNOSIS — R053 Chronic cough: Secondary | ICD-10-CM

## 2023-04-05 MED ORDER — ESOMEPRAZOLE MAGNESIUM 40 MG PO CPDR: 40.0000 mg | DELAYED_RELEASE_CAPSULE | Freq: Every day | ORAL | 3 refills | Status: DC

## 2023-04-05 MED ORDER — GABAPENTIN 100 MG PO CAPS: 100.0000 mg | ORAL_CAPSULE | Freq: Three times a day (TID) | ORAL | 3 refills | Status: DC

## 2023-04-05 MED ORDER — HYDROCODONE BIT-HOMATROP MBR 5-1.5 MG/5ML PO SOLN: 5.0000 mL | Freq: Four times a day (QID) | ORAL | 0 refills | Status: DC | PRN

## 2023-04-05 NOTE — Progress Notes (Signed)
 Tyler Hendricks    [id number]    14-May-1950  Primary Care Physician:Crawford, Almarie LABOR, MD  Referring Physician: Rollene Almarie LABOR, MD [address] [place],  [place] [address]  Problem list: Follow-up for pulmonary fibrosis Started on Ofev  in late June 2022.   HPI: 73 y.o.  with history of coronary artery disease, HIV, hepatitis C, cirrhosis Complains of chronic cough for the past several years. He has paroxysms of cough associated with dyspnea. Mild chest congestion History notable for COVID-19 infection in December 2020. He had very mild symptoms and did not require hospitalization.  CT scan with probable UIP pattern pulmonary fibrosis. He has been referred to the ILD clinic for further evaluation  Pets: Has a dog. No cats, birds, farm animals Occupation: Retired psychologist, forensic Exposures: No known exposures. No mold, hot tub, Jacuzzi. No down pillows or comforters ILD questionnaire 10/03/2019-negative Smoking history: 60-pack-year smoker. Quit in late 1990s Travel history: Originally from [place] . Moved to [place]  in 2018 Relevant family history: Mother had emphysema. She was a heavy smoker.  Interim history: Discussed the use of AI scribe software for clinical note transcription with the patient, who gave verbal consent to proceed.  The patient, with a history of pulmonary fibrosis, presents with a persistent cough that 'comes out of the blue.' He describes a sensation of feeling his lungs when he coughs. He has been treated with Ofev  for his pulmonary fibrosis, which he takes twice daily. However, he has experienced significant weight loss and a decrease in appetite, which he attributes to the Ofev  and his lung scarring. He admits to eating 'junk most of the time.'  The patient has been off Nexium , an anti-acid medication, for an extended period, despite it being on his medication list. He does not recall ever taking it. The doctor  suggests that treating his acid reflux could help with his cough, although it may not eliminate it completely. He also suggests trying Neurontin  to suppress the nerve endings and reduce the urge to cough.  The patient also mentions a decrease in muscle mass and is considering supplementing his diet with Ensure. He expresses concern about his disease progression, noting that his CT scan showed a slight worsening of the scarring in his lungs and a decline in his lung function test. However, his ability to exchange oxygen  has remained stable.  The patient is also considering a lung transplant, but expresses concern about the need for lifelong medication and his ability to comply with the necessary exercise and rehabilitation program. He is also aware of the need to improve his diet and increase his protein intake.   Outpatient Encounter Medications as of 04/05/2023  Medication Sig   amLODipine  (NORVASC ) 2.5 MG tablet TAKE 1 TABLET BY MOUTH EVERY DAY   atenolol  (TENORMIN ) 50 MG tablet TAKE 1 TABLET BY MOUTH EVERY DAY   bictegravir-emtricitabine -tenofovir  AF (BIKTARVY ) 50-200-25 MG TABS tablet Take 1 tablet by mouth daily.   clopidogrel  (PLAVIX ) 75 MG tablet TAKE 1 TABLET BY MOUTH EVERY DAY   esomeprazole  (NEXIUM ) 40 MG capsule TAKE 1 CAPSULE (40 MG TOTAL) BY MOUTH DAILY.   HYDROcodone  bit-homatropine (HYCODAN ) 5-1.5 MG/5ML syrup Take 5 mLs by mouth every 6 (six) hours as needed (chronic cough - pulmonary fibrosis).   losartan -hydrochlorothiazide  (HYZAAR ) 100-25 MG tablet Take 1 tablet by mouth daily.   Multiple Vitamin (MULTIVITAMIN) tablet Take 1 tablet by mouth daily.   OFEV  100 MG CAPS TAKE  1 CAPSULE BY MOUTH 2 TIMES A DAY WITH FOOD. TAKE 12 HOURS APART.   Oxycodone  HCl 10 MG TABS Take 10 mg by mouth as needed (Pain).   rosuvastatin  (CRESTOR ) 10 MG tablet Take 1 tablet (10 mg total) by mouth daily.   SYNJARDY  XR 12.07-998 MG TB24 Take 2 tablets by mouth daily.   VASCEPA  1 g capsule Take 2 capsules  (2 g total) by mouth 2 (two) times daily.   No facility-administered encounter medications on file as of 04/05/2023.    Physical Exam: Blood pressure 133/86, pulse 65, height 6' (1.829 m), weight 197 lb (89.4 kg), SpO2 92%. Gen:      No acute distress HEENT:  EOMI, sclera anicteric Neck:     No masses; no thyromegaly Lungs:    Bibasal crackles CV:         Regular rate and rhythm; no murmurs Abd:      + bowel sounds; soft, non-tender; no palpable masses, no distension Ext:    No edema; adequate peripheral perfusion Skin:      Warm and dry; no rash Neuro: alert and oriented x 3 Psych: normal mood and affect   Data Reviewed: Imaging: High-res CT 07/13/2019-groundglass attenuation with septal thickening, cylindrical bronchiectasis with mild basal gradient Probable UIP pattern High-res CT 06/24/2020-stable pulmonary fibrosis and probable UIP pattern High-res CT 04/21/2022-stable pattern of probable UIP pattern pulmonary fibrosis High-res CT 03/25/2023-mild progression of probable UIP pattern pulmonary fibrosis I have reviewed the images personally  PFTs: 09/01/2019 FVC 3.78 [78%], FEV1 3.14 [87%], F/F 83, TLC 6.40 [86%], DLCO 22.76 [81%] Normal test  06/28/2020 FVC 3.70 [71%], FEV1 3.05 [85%], F/F 82, TLC 5.76 [79%], DLCO 19.32 [69%] Minimal restriction, mild diffusion defect  07/22/2021 FVC 3.70 [1 7%], FEV1 3.01 [85%], F/F 81, TLC 5.84 [78%], DLCO 15.89 [57%] Minimal restriction, moderate diffusion defect  04/17/2022 FVC 3.69 [77%], FEV1 2.86 [82%], F/F78, TLC 5.49 [73%], DLCO 14.25 [52%] Mild restriction, moderate diffusion defect  03/25/2023 FVC 3.61 [76%], FEV1 2.89 [83%], F/F80, TLC 4.62 (2%], DLCO 15.22 [55%] Mild restriction, moderate diffusion defect  Labs: CTD serologies 08/30/2019-positive for ANA 1:40, nuclear, speckled  Hepatic panel 12/30/2021-significant for AST 42, total bilirubin 4.3  Sleep: CPAP download 11/25/2019-2% compliance  Cardiac: Echocardiogram  09/06/2020-LVEF 60 to 65%, normal RV systolic size and function, TAPSE 1.5 cm  Assessment:  Pulmonary fibrosis, IPF Has probable UIP pattern on imaging There is no symptoms of connective tissue disease. Borderline positive ANA is likely nonspecific He did have Covid in December 2020 but it was a very mild case with no hospitalization, besides his symptoms preceded the Covid infection  He likely has IPF based on CT appearance in male smoker. Discussed further work-up such as bronchoscopy, lung biopsy but he wants to avoid invasive procedures  After multiple discussions informed decision making with the patient we have started treatment with Ofev  which is tolerating well so far.  We need to be cautious given history of cirrhosis CT shows stable pulmonary fibrosis but he does have a drop in diffusion capacity which will monitor Labs in September reviewed with normal lung function Refer to pulmonary rehab We discussed lung transplant and he wants to think more about.  Chronic cough Secondary to pulmonary fibrosis.  He is getting Hycodan  cough syrup We discussed coming off Hycodan  as he is also on oxy for chronic pain and he is trying to limit use of cough medication as much as possible. -Resume Nexium  twice daily for potential acid reflux  contributing to cough. -Start Neurontin  for cough suppression.  Weight Loss Noted loss of appetite and weight, potentially due to Ofev  and disease progression. -Encouraged high calorie, protein rich diet. -Consider over-the-counter supplementation with Ensure.  Severe OSA Noncompliant with CPAP.  Encouraged him to use it daily.  Health maintenance Up-to-date with Covid booster and flu vaccine  Follow-up in 6 months.        Plan/Recommendations: Continue Ofev  PPI twice daily, start Neurontin  Pulmonary rehab referral  Lonna Coder MD [facility] 04/05/2023, 9:37 AM  CC: Rollene Almarie LABOR, *

## 2023-04-05 NOTE — Patient Instructions (Signed)
 VISIT SUMMARY:  During today's visit, we discussed your persistent cough, weight loss, and concerns about your lung health. We reviewed your current medications and made some adjustments to help manage your symptoms. We also talked about the possibility of a lung transplant and the importance of maintaining a healthy diet and exercise routine.  YOUR PLAN:  -IDIOPATHIC PULMONARY FIBROSIS (IPF): Idiopathic Pulmonary Fibrosis (IPF) is a condition where the lung tissue becomes scarred and stiff, making it difficult to breathe. We will continue your current medication, Ofev , twice daily. Additionally, we will resume Nexium  twice daily to address potential acid reflux that may be contributing to your cough, and start Neurontin  to help suppress the urge to cough. Consider joining a pulmonary rehabilitation program to help maintain your lung function and exercise capacity.  -WEIGHT LOSS: Your weight loss and decreased appetite may be related to your medication and lung condition. To address this, we recommend a high-calorie, protein-rich diet and considering over-the-counter nutritional supplements like Ensure to help maintain your weight and muscle mass.  -LUNG TRANSPLANT CONSIDERATION: We discussed the possibility of a lung transplant, which involves replacing your damaged lungs with healthy ones from a donor. This would require lifelong medication and a commitment to an exercise and rehabilitation program. We will continue to evaluate this option in future appointments.  INSTRUCTIONS:  Please follow up in 6 months for a re-evaluation of your condition and to discuss the progress of your treatment plan. In the meantime, resume taking Nexium  twice daily, start Neurontin  as prescribed, and consider joining a pulmonary rehabilitation program. Maintain a high-calorie, protein-rich diet and consider using Ensure to help with weight maintenance.

## 2023-04-05 NOTE — Telephone Encounter (Signed)
 [facility].  Message routed to Dr. Isaiah Serge to advise

## 2023-04-09 ENCOUNTER — Encounter (HOSPITAL_COMMUNITY): Payer: Self-pay

## 2023-04-09 ENCOUNTER — Telehealth (HOSPITAL_COMMUNITY): Payer: Self-pay

## 2023-04-09 NOTE — Progress Notes (Unsigned)
Cardiology Office Note:  .   Date:  04/12/2023  ID:  Tyler Hendricks, DOB 1950/06/06, MRN [id number] PCP: Myrlene Broker, MD  [facility] Providers Cardiologist:  Lance Muss, MD    Patient Profile: .      PMH Coronary artery disease Prior stenting as outline below, all done in [place] Aortic stenosis TTE 03/25/2023 EF 55-60, no rwma, G1DD, normal RV, mildly dilated LA Aortic valve: severe calcification, trivial regurgitation, low flow gradient, severe AS, AVA 0.76 cm2, MG 31.0 mmHg Pulmonary fibrosis Hepatitis B Cirrhosis HIV Hypertension Hyperlipidemia Type 2 diabetes mellitus Carotid artery disease Carotid Duplex 03/03/23 Non-hemodynamically significant plaque <50% bilaterally  He established with Dr. Eldridge Dace in 2019, now followed by Dr. Rosemary Holms. Prior stents: OM1 3.0 x 23 mm, left posterolateral 2.5 x 13 mm, diagonal 1 2.5 x 13, ostial diagonal 2.5 mm, proximal LAD - unknown size, mid LAD 3.0 x 13 - all BMS. Multivessel disease with stents placed in 2000 in [place]. History of HIV since 1989 with viral load nondetectable.   Last cath 2005. All stents were patent and there was moderate disease in the right PDA and right PLA. In 2010, he had a monitor showing NSR with PVCs. He was diagnosed with moderate aortic stenosis in 2018 by echo while in [place].   He is followed by Dr. Isaiah Serge, pulmonology, for history of pulmonary fibrosis. He was working 4 days/week at a golf course.   Last cardiology clinic visit was 02/15/2023 with Dr. Rosemary Holms. History of aortic stenosis moderate on echo 08/2020. Has a cough felt to be 2/2 pulmonary fibrosis. Concern for carotid bruit felt to be possible conducted murmur, carotid duplex ordered which revealed minimal nonobstructive plaque in bilateral carotid arteries. Echocardiogram completed 03/25/23 obtained for surveillance of aortic stenosis revealed aortic valve moderately reduced with mean gradient 31.0 mmHg, LVEF 55-60%. He was advised to  return for symptom evaluation.        History of Present Illness: .   Tyler Hendricks is a pleasant 73 y.o. male who is here today for follow-up of aortic stenosis. He reports no significant changes in his daily activities, which include light yard work and walking his dog at a slow pace. He likes to stay active but admits he also sits and reads a great deal. However, he has noticed a gradual increase in shortness of breath. States at times it is harder to get a deep breath. He denies any chest discomfort, aching, or heaviness. He sleeps on two staggered pillows which has not changed, no PND. He has a history of sleep apnea but does not currently use a CPAP mask due to discomfort. He occasionally experiences lightheadedness upon standing up too quickly but denies any palpitations or edema. Gets out of bed slowly.  No presyncope or syncope.  Has a persistent cough, which has not worsened recently. he attributes to a lung issue.  Discussed the use of AI scribe software for clinical note transcription with the patient, who gave verbal consent to proceed.   ROS: See HPI       Studies Reviewed: .        Risk Assessment/Calculations:             Physical Exam:   VS:  BP 110/62   Pulse 68   Ht 6' (1.829 m)   Wt 197 lb 9.6 oz (89.6 kg)   SpO2 93%   BMI 26.80 kg/m    Wt Readings from Last 3 Encounters:  04/12/23 197 lb  9.6 oz (89.6 kg)  04/05/23 197 lb (89.4 kg)  02/15/23 196 lb 3.2 oz (89 kg)    GEN: Well nourished, well developed in no acute distress NECK: No JVD; No carotid bruits CARDIAC: RRR, no murmurs, rubs, gallops RESPIRATORY:  Clear to auscultation without rales, wheezing or rhonchi  ABDOMEN: Soft, non-tender, non-distended EXTREMITIES:  No edema; No deformity     ASSESSMENT AND PLAN: .    Aortic stenosis/Shortness of breath: Echo 03/25/2023 with a EF 55-60, G1 DD, severe calcification of the aortic valve, paradoxical low-flow/low gradient severe aortic stenosis with AVA by VTI  0.76 cm, mean gradient 31 mmHg. Advised by Dr. Rosemary Holms, primary cardiologist to be seen to evaluate symptoms. Admits he is more short of breath over the past several months. Also has history of IPF which contributes to chronic SOB. He is followed by pulmonology. No chest pain, orthopnea, PND, edema, presyncope or syncope. We discussed pathophysiology of aortic stenosis. He agrees with referral to Structural Heart team to discuss treatment options.  Information on TAVR provided and I encouraged him to bring a family member to the appointment. Will forward to Dr. Rosemary Holms for agreement with plan.   CAD without angina: He denies chest pain. Has chronic shortness of breath that he feels is stable. No orthopnea, PND, or edema. No symptoms concerning for angina.  No indication for further ischemic evaluation at this time. No bleeding concerns. Continue GDMT including amlodipine, atenolol, Plavix, Vascepa, losartan, rosuvastatin.  Hypertension: BP is well controlled. Unclear if he monitors consistently, but he reports no recent concerns.  Renal function stable on labs completed 12/18/2022.  No medication changes today.  Hyperlipidemia LDL goal < 70: Lipid panel completed 11/03/22 revealed total cholesterol 142, HDL 70, LDL 58, and triglycerides 68. Cholesterol is well controlled on current therapy. Continue rosuvastatin and Vascepa.  OSA: Noncompliant with CPAP. Encouraged management per pulmonology to consider alternative masks or other options.         Dispo: Referral to [facility] Clinic/3-4 months with Dr. Rosemary Holms  Signed, Eligha Bridegroom, NP-C

## 2023-04-09 NOTE — Telephone Encounter (Signed)
Pt insurance is active and benefits verified through Medicare A/B. Co-pay $0.00, DED $257.00/$0.00 met, out of pocket $0.00/$0.00 met, co-insurance 20%. No pre-authorization required.    2ndary insurance is active and benefits verified through [employer]. Co-pay $50.00, DED $0.00/$0.00 met, out of pocket $2,670.00/$0.00 met, co-insurance 0%. No pre-authorization required. Celle V[employer], 04/09/23 @ 4:29PM, [phone]

## 2023-04-09 NOTE — Telephone Encounter (Signed)
Called patient to see if he was interested in participating in the Pulmonary Rehab Program. Patient stated yes. Patient will come in for orientation on 04/14/23 @ 10:30AM and will attend the 10:15AM exercise class. Went over insurance, patient verbalized understanding.   Pensions consultant.

## 2023-04-09 NOTE — Progress Notes (Signed)
Referred to Pulmonary rehab by [facility] with the diagnosis of Interstitial pulmonary disease. Clinical review of pt follow up appt on 04/05/23 with Dr. Isaiah Serge - pulmonary office note. Pt appropriate for scheduling for on site Pulmonary Rehab. Will forward to Estate manager/land agent  for scheduling and insurance verification. Durel Salts, BSRT.

## 2023-04-12 ENCOUNTER — Encounter: Payer: Self-pay | Admitting: Nurse Practitioner

## 2023-04-12 ENCOUNTER — Ambulatory Visit: Payer: Medicare Other | Attending: Nurse Practitioner | Admitting: Nurse Practitioner

## 2023-04-12 VITALS — BP 110/62 | HR 68 | Ht 72.0 in | Wt 197.6 lb

## 2023-04-12 DIAGNOSIS — I350 Nonrheumatic aortic (valve) stenosis: Secondary | ICD-10-CM | POA: Diagnosis not present

## 2023-04-12 DIAGNOSIS — R0602 Shortness of breath: Secondary | ICD-10-CM | POA: Insufficient documentation

## 2023-04-12 DIAGNOSIS — E785 Hyperlipidemia, unspecified: Secondary | ICD-10-CM | POA: Diagnosis not present

## 2023-04-12 DIAGNOSIS — G4733 Obstructive sleep apnea (adult) (pediatric): Secondary | ICD-10-CM | POA: Diagnosis not present

## 2023-04-12 DIAGNOSIS — I10 Essential (primary) hypertension: Secondary | ICD-10-CM | POA: Insufficient documentation

## 2023-04-12 DIAGNOSIS — I2510 Atherosclerotic heart disease of native coronary artery without angina pectoris: Secondary | ICD-10-CM | POA: Diagnosis not present

## 2023-04-12 NOTE — Patient Instructions (Signed)
Medication Instructions:   Your physician recommends that you continue on your current medications as directed. Please refer to the Current Medication list given to you today.   *If you need a refill on your cardiac medications before your next appointment, please call your pharmacy*   Lab Work:  None ordered.   If you have labs (blood work) drawn today and your tests are completely normal, you will receive your results only by: MyChart Message (if you have MyChart) OR A paper copy in the mail If you have any lab test that is abnormal or we need to change your treatment, we will call you to review the results.   Testing/Procedures:  TVAR.  The office will call you to schedule consult appointment.    Follow-Up: At [facility], you and your health needs are our priority.  As part of our continuing mission to provide you with exceptional heart care, we have created designated Provider Care Teams.  These Care Teams include your primary Cardiologist (physician) and Advanced Practice Providers (APPs -  Physician Assistants and Nurse Practitioners) who all work together to provide you with the care you need, when you need it.  We recommend signing up for the patient portal called "MyChart".  Sign up information is provided on this After Visit Summary.  MyChart is used to connect with patients for Virtual Visits (Telemedicine).  Patients are able to view lab/test results, encounter notes, upcoming appointments, etc.  Non-urgent messages can be sent to your provider as well.   To learn more about what you can do with MyChart, go to [email or web address].    Your next appointment:   4 month(s)  Provider:   Dr. Rosemary Holms     Other Instructions  You have been referred to [facility].     1st Floor: - Lobby - Registration  - Pharmacy  - Lab - Cafe  2nd Floor: - PV Lab - Diagnostic Testing (echo, CT, nuclear med)  3rd Floor: - Vacant  4th Floor: - TCTS  (cardiothoracic surgery) - [facility] - [facility] - Vascular Surgery  - Vascular Ultrasound  5th Floor: - [facility] (general and EP) - Clinical Pharmacy for coumadin, hypertension, lipid, weight-loss medications, and med management appointments    Valet parking services will be available as well.

## 2023-04-14 ENCOUNTER — Encounter (HOSPITAL_COMMUNITY)
Admission: RE | Admit: 2023-04-14 | Discharge: 2023-04-14 | Disposition: A | Discharge status: Home / Self Care | Payer: Medicare Other | Source: Ambulatory Visit | Attending: Pulmonary Disease | Admitting: Pulmonary Disease

## 2023-04-14 ENCOUNTER — Encounter (HOSPITAL_COMMUNITY): Payer: Self-pay

## 2023-04-14 VITALS — BP 136/70 | HR 58 | Wt 198.0 lb

## 2023-04-14 DIAGNOSIS — I083 Combined rheumatic disorders of mitral, aortic and tricuspid valves: Secondary | ICD-10-CM | POA: Insufficient documentation

## 2023-04-14 DIAGNOSIS — K7460 Unspecified cirrhosis of liver: Secondary | ICD-10-CM | POA: Diagnosis not present

## 2023-04-14 DIAGNOSIS — E119 Type 2 diabetes mellitus without complications: Secondary | ICD-10-CM | POA: Insufficient documentation

## 2023-04-14 DIAGNOSIS — I2510 Atherosclerotic heart disease of native coronary artery without angina pectoris: Secondary | ICD-10-CM | POA: Insufficient documentation

## 2023-04-14 DIAGNOSIS — G4730 Sleep apnea, unspecified: Secondary | ICD-10-CM | POA: Diagnosis not present

## 2023-04-14 DIAGNOSIS — J984 Other disorders of lung: Secondary | ICD-10-CM | POA: Diagnosis not present

## 2023-04-14 DIAGNOSIS — Z21 Asymptomatic human immunodeficiency virus [HIV] infection status: Secondary | ICD-10-CM | POA: Diagnosis not present

## 2023-04-14 DIAGNOSIS — I10 Essential (primary) hypertension: Secondary | ICD-10-CM | POA: Insufficient documentation

## 2023-04-14 DIAGNOSIS — J8410 Pulmonary fibrosis, unspecified: Secondary | ICD-10-CM | POA: Diagnosis not present

## 2023-04-14 DIAGNOSIS — R0600 Dyspnea, unspecified: Secondary | ICD-10-CM | POA: Diagnosis not present

## 2023-04-14 DIAGNOSIS — J849 Interstitial pulmonary disease, unspecified: Secondary | ICD-10-CM

## 2023-04-14 DIAGNOSIS — R600 Localized edema: Secondary | ICD-10-CM | POA: Insufficient documentation

## 2023-04-14 LAB — GLUCOSE, CAPILLARY: Glucose-Capillary: 207 mg/dL — ABNORMAL HIGH (ref 70–99)

## 2023-04-14 NOTE — Progress Notes (Signed)
Pulmonary Rehab Orientation Physical Assessment Note    Well appearing, A&Ox4, NAD Eyes/Ears: Wears glasses, reports no deficits while wearing them, states he is hard of hearing, no hearing aids Lungs: Clear with no wheezes, rales, rhonchi, denies chronic cough, dyspnea on exertion Heart: Regular rate rhythm, no murmurs, no rubs, no clicks Gastrointestinal: abdomin soft, + bowel sounds in all 4 quads, states he recently has lost 10 pounds due to ofev in the last month, endorses normal BMs Genitourinary: WNL, pt denies s/s Extremities:  +2 pulses, grip strength equal, strong, no edema, no cyanosis, no clubbing Integumentary: pt denies any rashes, open or non healing wounds Psy/Soc: Pt denies any psy/soc needs Assistive devices: None used

## 2023-04-14 NOTE — Progress Notes (Signed)
Pulmonary Individual Treatment Plan  Patient Details  Name: Tyler Hendricks MRN: [id number] Date of Birth: 1950-10-01 Referring Provider:   Doristine Devoid Pulmonary Rehab Walk Test from 04/14/2023 in [facility]  Referring Provider Mannam       Initial Encounter Date:  Flowsheet Row Pulmonary Rehab Walk Test from 04/14/2023 in [facility]  Date 04/14/23       Visit Diagnosis: ILD (interstitial lung disease) (HCC)  Patient's Home Medications on Admission:   Current Outpatient Medications:    amLODipine (NORVASC) 2.5 MG tablet, TAKE 1 TABLET BY MOUTH EVERY DAY, Disp: 90 tablet, Rfl: 3   atenolol (TENORMIN) 50 MG tablet, TAKE 1 TABLET BY MOUTH EVERY DAY, Disp: 90 tablet, Rfl: 3   bictegravir-emtricitabine-tenofovir AF (BIKTARVY) 50-200-25 MG TABS tablet, Take 1 tablet by mouth daily., Disp: 30 tablet, Rfl: 11   clopidogrel (PLAVIX) 75 MG tablet, TAKE 1 TABLET BY MOUTH EVERY DAY, Disp: 90 tablet, Rfl: 3   esomeprazole (NEXIUM) 40 MG capsule, Take 1 capsule (40 mg total) by mouth daily at 12 noon., Disp: 90 capsule, Rfl: 3   HYDROcodone bit-homatropine (HYCODAN) 5-1.5 MG/5ML syrup, Take 5 mLs by mouth every 6 (six) hours as needed (chronic cough - pulmonary fibrosis)., Disp: 473 mL, Rfl: 0   losartan-hydrochlorothiazide (HYZAAR) 100-25 MG tablet, Take 1 tablet by mouth daily., Disp: 90 tablet, Rfl: 3   Multiple Vitamin (MULTIVITAMIN) tablet, Take 1 tablet by mouth daily., Disp: , Rfl:    OFEV 100 MG CAPS, TAKE 1 CAPSULE BY MOUTH 2 TIMES A DAY WITH FOOD. TAKE 12 HOURS APART., Disp: 180 capsule, Rfl: 1   Oxycodone HCl 10 MG TABS, Take 10 mg by mouth as needed (Pain)., Disp: , Rfl:    rosuvastatin (CRESTOR) 10 MG tablet, Take 1 tablet (10 mg total) by mouth daily., Disp: 90 tablet, Rfl: 3   SYNJARDY XR 12.07-998 MG TB24, Take 2 tablets by mouth daily., Disp: 180 tablet, Rfl: 3    VASCEPA 1 g capsule, Take 2 capsules (2 g total) by mouth 2 (two) times daily., Disp: 360 capsule, Rfl: 3  Past Medical History: Past Medical History:  Diagnosis Date   Abnormal result of cardiovascular function study, unspecified    Atherosclerotic heart disease of native coronary artery without angina pectoris    CAD (coronary artery disease)    Coronary angioplasty status    Diabetes mellitus without complication (HCC)    Essential (primary) hypertension    Gallstones    Hepatic cirrhosis (HCC)    Hepatitis B core antibody positive 01/08/2019   Hepatitis C    HIV infection (HCC)    Hyperlipidemia    Hypertension    Internal hemorrhoids    Kidney stones    Lung disease    Mixed hyperlipidemia    Palpitations    Sleep apnea    CPAP   Tubular adenoma of colon     Tobacco Use: Social History   Tobacco Use  Smoking Status Former   Current packs/day: 0.00   Average packs/day: 2.0 packs/day for 20.0 years (40.0 ttl pk-yrs)   Types: Cigarettes   Start date: 85   Quit date: 28   Years since quitting: 31.0   Passive exposure: Past  Smokeless Tobacco Never    Labs: Review Flowsheet  More data exists      Latest Ref Rng & Units 12/06/2020 10/08/2021 05/06/2022 11/03/2022 12/10/2022  Labs for  [facility] Cardiac and Pulmonary Rehab  Cholestrol 0 - 200 mg/dL 191  478  - 295  -  LDL (calc) 0 - 99 mg/dL 59  47  - 58  -  HDL-C >39.00 mg/dL 76  62.13  - 08.65  -  Trlycerides 0.0 - 149.0 mg/dL 62  78.4  - 69.6  -  Hemoglobin A1c 4.6 - 6.5 % - 6.7  6.5  7.0  6.8     Capillary Blood Glucose: Lab Results  Component Value Date   GLUCAP 207 (H) 04/14/2023   GLUCAP 84 12/30/2021   GLUCAP 113 (H) 12/30/2021    POCT Glucose     Row Name 04/14/23 1142             POCT Blood Glucose   Pre-Exercise 207 mg/dL                Pulmonary Assessment Scores:  Pulmonary Assessment Scores     Row Name 04/14/23 1146         ADL UCSD   ADL Phase Entry     SOB Score total 21        CAT Score   CAT Score 10       mMRC Score   mMRC Score 2             UCSD: Self-administered rating of dyspnea associated with activities of daily living (ADLs) 6-point scale (0 = "not at all" to 5 = "maximal or unable to do because of breathlessness")  Scoring Scores range from 0 to 120.  Minimally important difference is 5 units  CAT: CAT can identify the health impairment of COPD patients and is better correlated with disease progression.  CAT has a scoring range of zero to 40. The CAT score is classified into four groups of low (less than 10), medium (10 - 20), high (21-30) and very high (31-40) based on the impact level of disease on health status. A CAT score over 10 suggests significant symptoms.  A worsening CAT score could be explained by an exacerbation, poor medication adherence, poor inhaler technique, or progression of COPD or comorbid conditions.  CAT MCID is 2 points  mMRC: mMRC (Modified Medical Research Council) Dyspnea Scale is used to assess the degree of baseline functional disability in patients of respiratory disease due to dyspnea. No minimal important difference is established. A decrease in score of 1 point or greater is considered a positive change.   Pulmonary Function Assessment:  Pulmonary Function Assessment - 04/14/23 1055       Breath   Bilateral Breath Sounds Basilar;Rales    Shortness of Breath Yes;Limiting activity             Exercise Target Goals: Exercise Program Goal: Individual exercise prescription set using results from initial 6 min walk test and THRR while considering  patient's activity barriers and safety.   Exercise Prescription Goal: Initial exercise prescription builds to 30-45 minutes a day of aerobic activity, 2-3 days per week.  Home exercise guidelines will be given to patient during program as part of exercise prescription that the participant will acknowledge.  Activity Barriers & Risk Stratification:   Activity Barriers & Cardiac Risk Stratification - 04/14/23 1053       Activity Barriers & Cardiac Risk Stratification   Activity Barriers Arthritis;Back Problems;Deconditioning;Muscular Weakness;Shortness of Breath    Cardiac Risk Stratification Moderate             6 Minute Walk:  6 Minute Walk  Row Name 04/14/23 1152         6 Minute Walk   Phase Initial     Distance 1200 feet     Walk Time 6 minutes     # of Rest Breaks 1  3:03-4:00     MPH 2.27     METS 2.61     RPE 11     Perceived Dyspnea  1     VO2 Peak 9.14     Symptoms No     Resting HR 58 bpm     Resting BP 136/70     Resting Oxygen Saturation  92 %     Exercise Oxygen Saturation  during 6 min walk 85 %     Max Ex. HR 80 bpm     Max Ex. BP 142/68     2 Minute Post BP 134/68       Interval HR   1 Minute HR 71     2 Minute HR 77     3 Minute HR 77     4 Minute HR 75     5 Minute HR 79     6 Minute HR 80     2 Minute Post HR 59     Interval Heart Rate? Yes       Interval Oxygen   Interval Oxygen? Yes     Baseline Oxygen Saturation % 92 %     1 Minute Oxygen Saturation % 93 %     1 Minute Liters of Oxygen 0 L     2 Minute Oxygen Saturation % 88 %     2 Minute Liters of Oxygen 0 L     3 Minute Oxygen Saturation % 85 %     3 Minute Liters of Oxygen 0 L     4 Minute Oxygen Saturation % 92 %     4 Minute Liters of Oxygen 2 L     5 Minute Oxygen Saturation % 93 %     5 Minute Liters of Oxygen 2 L     6 Minute Oxygen Saturation % 89 %     6 Minute Liters of Oxygen 2 L     2 Minute Post Oxygen Saturation % 94 %     2 Minute Post Liters of Oxygen 2 L              Oxygen Initial Assessment:  Oxygen Initial Assessment - 04/14/23 1054       Home Oxygen   Sleep Oxygen Prescription CPAP   doesn't wear   Home Exercise Oxygen Prescription None    Home Resting Oxygen Prescription None      Initial 6 min Walk   Oxygen Used Continuous    Liters per minute 2      Program Oxygen Prescription    Program Oxygen Prescription Continuous    Liters per minute 2      Intervention   Short Term Goals To learn and understand importance of monitoring SPO2 with pulse oximeter and demonstrate accurate use of the pulse oximeter.;To learn and understand importance of maintaining oxygen saturations>88%;To learn and demonstrate proper pursed lip breathing techniques or other breathing techniques. ;To learn and demonstrate proper use of respiratory medications;To learn and exhibit compliance with exercise, home and travel O2 prescription    Long  Term Goals Maintenance of O2 saturations>88%;Compliance with respiratory medication;Verbalizes importance of monitoring SPO2 with pulse oximeter and return demonstration;Exhibits proper breathing techniques, such as pursed lip breathing  or other method taught during program session;Demonstrates proper use of MDI's;Exhibits compliance with exercise, home  and travel O2 prescription             Oxygen Re-Evaluation:   Oxygen Discharge (Final Oxygen Re-Evaluation):   Initial Exercise Prescription:  Initial Exercise Prescription - 04/14/23 1100       Date of Initial Exercise RX and Referring Provider   Date 04/14/23    Referring Provider Mannam    Expected Discharge Date 07/08/23      Oxygen   Oxygen Continuous    Liters 2    Maintain Oxygen Saturation 88% or higher      Treadmill   MPH 2    Grade 0    Minutes 15    METs 2.54      Bike   Level 2    Watts 40    Minutes 15    METs 2      Prescription Details   Frequency (times per week) 2    Duration Progress to 30 minutes of continuous aerobic without signs/symptoms of physical distress      Intensity   THRR 40-80% of Max Heartrate 59-118    Ratings of Perceived Exertion 11-13    Perceived Dyspnea 0-4      Progression   Progression Continue to progress workloads to maintain intensity without signs/symptoms of physical distress.      Resistance Training   Training Prescription  Yes    Weight blue bands    Reps 10-15             Perform Capillary Blood Glucose checks as needed.  Exercise Prescription Changes:   Exercise Comments:   Exercise Goals and Review:   Exercise Goals     Row Name 04/14/23 1054             Exercise Goals   Increase Physical Activity Yes       Intervention Provide advice, education, support and counseling about physical activity/exercise needs.;Develop an individualized exercise prescription for aerobic and resistive training based on initial evaluation findings, risk stratification, comorbidities and participant's personal goals.       Expected Outcomes Short Term: Attend rehab on a regular basis to increase amount of physical activity.;Long Term: Add in home exercise to make exercise part of routine and to increase amount of physical activity.;Long Term: Exercising regularly at least 3-5 days a week.       Increase Strength and Stamina Yes       Intervention Provide advice, education, support and counseling about physical activity/exercise needs.;Develop an individualized exercise prescription for aerobic and resistive training based on initial evaluation findings, risk stratification, comorbidities and participant's personal goals.       Expected Outcomes Short Term: Increase workloads from initial exercise prescription for resistance, speed, and METs.;Short Term: Perform resistance training exercises routinely during rehab and add in resistance training at home;Long Term: Improve cardiorespiratory fitness, muscular endurance and strength as measured by increased METs and functional capacity ( )       Able to understand and use rate of perceived exertion (RPE) scale Yes       Intervention Provide education and explanation on how to use RPE scale       Expected Outcomes Short Term: Able to use RPE daily in rehab to express subjective intensity level;Long Term:  Able to use RPE to guide intensity level when exercising  independently       Able to understand and use Dyspnea scale Yes  Intervention Provide education and explanation on how to use Dyspnea scale       Expected Outcomes Short Term: Able to use Dyspnea scale daily in rehab to express subjective sense of shortness of breath during exertion;Long Term: Able to use Dyspnea scale to guide intensity level when exercising independently       Knowledge and understanding of Target Heart Rate Range (THRR) Yes       Intervention Provide education and explanation of THRR including how the numbers were predicted and where they are located for reference       Expected Outcomes Short Term: Able to state/look up THRR;Long Term: Able to use THRR to govern intensity when exercising independently;Short Term: Able to use daily as guideline for intensity in rehab       Understanding of Exercise Prescription Yes       Intervention Provide education, explanation, and written materials on patient's individual exercise prescription       Expected Outcomes Short Term: Able to explain program exercise prescription;Long Term: Able to explain home exercise prescription to exercise independently                Exercise Goals Re-Evaluation :   Discharge Exercise Prescription (Final Exercise Prescription Changes):   Nutrition:  Target Goals: Understanding of nutrition guidelines, daily intake of sodium 1500mg , cholesterol 200mg , calories 30% from fat and 7% or less from saturated fats, daily to have 5 or more servings of fruits and vegetables.  Biometrics:  Pre Biometrics - 04/14/23 1145       Pre Biometrics   Grip Strength 32 kg              Nutrition Therapy Plan and Nutrition Goals:   Nutrition Assessments:  MEDIFICTS Score Key: >=70 Need to make dietary changes  40-70 Heart Healthy Diet <= 40 Therapeutic Level Cholesterol Diet   Picture Your Plate Scores: <16 Unhealthy dietary pattern with much room for improvement. 41-50 Dietary pattern  unlikely to meet recommendations for good health and room for improvement. 51-60 More healthful dietary pattern, with some room for improvement.  >60 Healthy dietary pattern, although there may be some specific behaviors that could be improved.    Nutrition Goals Re-Evaluation:   Nutrition Goals Discharge (Final Nutrition Goals Re-Evaluation):   Psychosocial: Target Goals: Acknowledge presence or absence of significant depression and/or stress, maximize coping skills, provide positive support system. Participant is able to verbalize types and ability to use techniques and skills needed for reducing stress and depression.  Initial Review & Psychosocial Screening:  Initial Psych Review & Screening - 04/14/23 1051       Initial Review   Current issues with None Identified      Family Dynamics   Good Support System? Yes      Barriers   Psychosocial barriers to participate in program There are no identifiable barriers or psychosocial needs.      Screening Interventions   Interventions Encouraged to exercise             Quality of Life Scores:  Scores of 19 and below usually indicate a poorer quality of life in these areas.  A difference of  2-3 points is a clinically meaningful difference.  A difference of 2-3 points in the total score of the Quality of Life Index has been associated with significant improvement in overall quality of life, self-image, physical symptoms, and general health in studies assessing change in quality of life.  PHQ-9: Review Flowsheet  More data  exists      04/14/2023 01/01/2023 07/10/2022 05/06/2022 01/02/2022  Depression screen PHQ 2/9  Decreased Interest 0 0 0 0 0  Down, Depressed, Hopeless 0 0 0 0 0  PHQ - 2 Score 0 0 0 0 0  Altered sleeping 0 - - - -  Tired, decreased energy 0 - - - -  Change in appetite 0 - - - -  Feeling bad or failure about yourself  0 - - - -  Trouble concentrating 0 - - - -  Moving slowly or fidgety/restless 0 - - - -   Suicidal thoughts 0 - - - -  PHQ-9 Score 0 - - - -  Difficult doing work/chores Not difficult at all - - - -   Interpretation of Total Score  Total Score Depression Severity:  1-4 = Minimal depression, 5-9 = Mild depression, 10-14 = Moderate depression, 15-19 = Moderately severe depression, 20-27 = Severe depression   Psychosocial Evaluation and Intervention:  Psychosocial Evaluation - 04/14/23 1143       Psychosocial Evaluation & Interventions   Interventions Encouraged to exercise with the program and follow exercise prescription    Comments Charly denies any psychosocial barriers or concerns at this time.    Expected Outcomes For Chadney to participate in PR free of any psychosocial barriers or concerns    Continue Psychosocial Services  No Follow up required             Psychosocial Re-Evaluation:   Psychosocial Discharge (Final Psychosocial Re-Evaluation):   Education: Education Goals: Education classes will be provided on a weekly basis, covering required topics. Participant will state understanding/return demonstration of topics presented.  Learning Barriers/Preferences:  Learning Barriers/Preferences - 04/14/23 1052       Learning Barriers/Preferences   Learning Barriers Hearing    Learning Preferences Written Material;Individual Instruction;Group Instruction             Education Topics: Know Your Numbers Group instruction that is supported by a PowerPoint presentation. Instructor discusses importance of knowing and understanding resting, exercise, and post-exercise oxygen saturation, heart rate, and blood pressure. Oxygen saturation, heart rate, blood pressure, rating of perceived exertion, and dyspnea are reviewed along with a normal range for these values.    Exercise for the Pulmonary Patient Group instruction that is supported by a PowerPoint presentation. Instructor discusses benefits of exercise, core components of exercise, frequency, duration,  and intensity of an exercise routine, importance of utilizing pulse oximetry during exercise, safety while exercising, and options of places to exercise outside of rehab.    MET Level  Group instruction provided by PowerPoint, verbal discussion, and written material to support subject matter. Instructor reviews what METs are and how to increase METs.    Pulmonary Medications Verbally interactive group education provided by instructor with focus on inhaled medications and proper administration.   Anatomy and Physiology of the Respiratory System Group instruction provided by PowerPoint, verbal discussion, and written material to support subject matter. Instructor reviews respiratory cycle and anatomical components of the respiratory system and their functions. Instructor also reviews differences in obstructive and restrictive respiratory diseases with examples of each.    Oxygen Safety Group instruction provided by PowerPoint, verbal discussion, and written material to support subject matter. There is an overview of "What is Oxygen" and "Why do we need it".  Instructor also reviews how to create a safe environment for oxygen use, the importance of using oxygen as prescribed, and the risks of noncompliance. There is a brief  discussion on traveling with oxygen and resources the patient may utilize.   Oxygen Use Group instruction provided by PowerPoint, verbal discussion, and written material to discuss how supplemental oxygen is prescribed and different types of oxygen supply systems. Resources for more information are provided.    Breathing Techniques Group instruction that is supported by demonstration and informational handouts. Instructor discusses the benefits of pursed lip and diaphragmatic breathing and detailed demonstration on how to perform both.     Risk Factor Reduction Group instruction that is supported by a PowerPoint presentation. Instructor discusses the definition of a risk  factor, different risk factors for pulmonary disease, and how the heart and lungs work together.   Pulmonary Diseases Group instruction provided by PowerPoint, verbal discussion, and written material to support subject matter. Instructor gives an overview of the different type of pulmonary diseases. There is also a discussion on risk factors and symptoms as well as ways to manage the diseases.   Stress and Energy Conservation Group instruction provided by PowerPoint, verbal discussion, and written material to support subject matter. Instructor gives an overview of stress and the impact it can have on the body. Instructor also reviews ways to reduce stress. There is also a discussion on energy conservation and ways to conserve energy throughout the day.   Warning Signs and Symptoms Group instruction provided by PowerPoint, verbal discussion, and written material to support subject matter. Instructor reviews warning signs and symptoms of stroke, heart attack, cold and flu. Instructor also reviews ways to prevent the spread of infection.   Other Education Group or individual verbal, written, or video instructions that support the educational goals of the pulmonary rehab program.    Knowledge Questionnaire Score:  Knowledge Questionnaire Score - 04/14/23 1149       Knowledge Questionnaire Score   Pre Score 11/18             Core Components/Risk Factors/Patient Goals at Admission:  Personal Goals and Risk Factors at Admission - 04/14/23 1052       Core Components/Risk Factors/Patient Goals on Admission   Improve shortness of breath with ADL's Yes    Intervention Provide education, individualized exercise plan and daily activity instruction to help decrease symptoms of SOB with activities of daily living.    Expected Outcomes Short Term: Improve cardiorespiratory fitness to achieve a reduction of symptoms when performing ADLs;Long Term: Be able to perform more ADLs without symptoms  or delay the onset of symptoms             Core Components/Risk Factors/Patient Goals Review:    Core Components/Risk Factors/Patient Goals at Discharge (Final Review):    [facility] Comments:   Comments: Dr. Mechele Collin is Medical Director for Pulmonary Rehab at [facility].

## 2023-04-14 NOTE — Progress Notes (Signed)
   04/14/23 1152  6 Minute Walk  Phase Initial  Distance 1200 feet  Walk Time 6 minutes  # of Rest Breaks 1 (3:03-4:00)  MPH 2.27  METS 2.61  RPE 11  Perceived Dyspnea  1  VO2 Peak 9.14  Symptoms No  Resting HR 58 bpm  Resting BP 136/70  Resting Oxygen Saturation  92 %  Exercise Oxygen Saturation  during 6 min walk 85 %  Max Ex. HR 80 bpm  Max Ex. BP 142/68  2 Minute Post BP 134/68  Interval HR  1 Minute HR 71  2 Minute HR 77  3 Minute HR 77  4 Minute HR 75  5 Minute HR 79  6 Minute HR 80  2 Minute Post HR 59  Interval Heart Rate? Yes  Interval Oxygen  Interval Oxygen? Yes  Baseline Oxygen Saturation % 92 %  1 Minute Oxygen Saturation % 93 %  1 Minute Liters of Oxygen 0 L  2 Minute Oxygen Saturation % 88 %  2 Minute Liters of Oxygen 0 L  3 Minute Oxygen Saturation % 85 %  3 Minute Liters of Oxygen 0 L  4 Minute Oxygen Saturation % 92 %  4 Minute Liters of Oxygen 2 L  5 Minute Oxygen Saturation % 93 %  5 Minute Liters of Oxygen 2 L  6 Minute Oxygen Saturation % 89 %  6 Minute Liters of Oxygen 2 L  2 Minute Post Oxygen Saturation % 94 %  2 Minute Post Liters of Oxygen 2 L

## 2023-04-15 NOTE — Progress Notes (Signed)
Structural Heart Clinic Consult Note  Chief Complaint  Patient presents with   New Patient (Initial Visit)    Severe aortic valve stenosis   History of Present Illness: 73 yo male with history of HIV, HTN, HLD, cirrhosis, Hepatitis B, pulmonary fibrosis, CAD, DM, carotid artery disease and aortic stenosis who is here today as a new consult, referred by Dr. Rosemary Holms, for further discussion regarding his aortic stenosis and possible TAVR. He had been followed in our office by Dr. Eldridge Dace since 2019 after moving to [facility] from [place]. He has CAD and has had prior stenting of the LAD, obtuse marginal branch, left posterolateral branch and diagonal branch, all done in [place]. He has had HIV since 1989 with undetectable viral load. His pulmonary fibrosis is followed in the pulmonary clinic. He has sleep apnea but does not use CPAP. He has cirrhosis and mild esophageal varices.   He has been followed for moderate aortic stenosis since 2018. Echo January 2025 with LVEF=55-60%, grade 1 diastolic dysfunction. Heavily calcified aortic valve with likely paradoxical low flow/low gradient moderate to severe aortic stenosis with mean gradient of 31 mmHg, AVA 0.76 cm2, DI 0.22, SVI 27. His valve appears to open reasonably well on visual inspection of the valve.   He tells me today that he has progressive dyspnea. He has no chest pain, dizziness or near syncope. Mild LE edema. He lives in [place], [place] with his wife. He is retired from Programme researcher, broadcasting/film/video. He has full dentures.   Primary Care Physician: Myrlene Broker, MD Primary Cardiologist: Rosemary Holms Referring Cardiologist: Rosemary Holms  Past Medical History:  Diagnosis Date   Abnormal result of cardiovascular function study, unspecified    Atherosclerotic heart disease of native coronary artery without angina pectoris    CAD (coronary artery disease)    Coronary angioplasty status    Diabetes mellitus without complication (HCC)    Essential  (primary) hypertension    Gallstones    Hepatic cirrhosis (HCC)    Hepatitis B core antibody positive 01/08/2019   Hepatitis C    HIV infection (HCC)    Hyperlipidemia    Hypertension    Internal hemorrhoids    Kidney stones    Lung disease    Mixed hyperlipidemia    Palpitations    Sleep apnea    CPAP   Tubular adenoma of colon     Past Surgical History:  Procedure Laterality Date   CARDIAC CATHETERIZATION Left 04/2013   chlecystectomy     CHOLECYSTECTOMY     COLONOSCOPY  05/02/2013   GAS/FLUID EXCHANGE Right 12/30/2021   Procedure: GAS/FLUID EXCHANGE;  Surgeon: Carmela Rima, MD;  Location: [facility] OR;  Service: Ophthalmology;  Laterality: Right;  SF6   PARS PLANA VITRECTOMY Right 12/30/2021   Procedure: PARS PLANA VITRECTOMY WITH 25 GAUGE;  Surgeon: Carmela Rima, MD;  Location: [facility] OR;  Service: Ophthalmology;  Laterality: Right;   PERCUTANEOUS CORONARY STENT INTERVENTION (PCI-S)     PHOTOCOAGULATION WITH LASER Right 12/30/2021   Procedure: PHOTOCOAGULATION WITH LASER;  Surgeon: Carmela Rima, MD;  Location: [facility] OR;  Service: Ophthalmology;  Laterality: Right;   POLYPECTOMY     UMBILICAL HERNIA REPAIR      Current Outpatient Medications  Medication Sig Dispense Refill   amLODipine (NORVASC) 2.5 MG tablet TAKE 1 TABLET BY MOUTH EVERY DAY 90 tablet 3   atenolol (TENORMIN) 50 MG tablet TAKE 1 TABLET BY MOUTH EVERY DAY 90 tablet 3   bictegravir-emtricitabine-tenofovir AF (BIKTARVY) 50-200-25 MG TABS tablet Take  1 tablet by mouth daily. 30 tablet 11   clopidogrel (PLAVIX) 75 MG tablet TAKE 1 TABLET BY MOUTH EVERY DAY 90 tablet 3   esomeprazole (NEXIUM) 40 MG capsule Take 1 capsule (40 mg total) by mouth daily at 12 noon. 90 capsule 3   HYDROcodone bit-homatropine (HYCODAN) 5-1.5 MG/5ML syrup Take 5 mLs by mouth every 6 (six) hours as needed (chronic cough - pulmonary fibrosis). 473 mL 0   losartan-hydrochlorothiazide (HYZAAR) 100-25 MG tablet Take 1 tablet by mouth daily. 90  tablet 3   Multiple Vitamin (MULTIVITAMIN) tablet Take 1 tablet by mouth daily.     OFEV 100 MG CAPS TAKE 1 CAPSULE BY MOUTH 2 TIMES A DAY WITH FOOD. TAKE 12 HOURS APART. 180 capsule 1   Oxycodone HCl 10 MG TABS Take 10 mg by mouth as needed (Pain).     rosuvastatin (CRESTOR) 10 MG tablet Take 1 tablet (10 mg total) by mouth daily. 90 tablet 3   SYNJARDY XR 12.07-998 MG TB24 Take 2 tablets by mouth daily. 180 tablet 3   VASCEPA 1 g capsule Take 2 capsules (2 g total) by mouth 2 (two) times daily. 360 capsule 3   No current facility-administered medications for this visit.    Allergies  Allergen Reactions   Integrilin [Eptifibatide] Other (See Comments)    Bleeding (non-specific)   Lopressor [Metoprolol Tartrate] Rash and Other (See Comments)    Bleeding (non-specific)    Penicillins Diarrhea and Rash    Social History   Socioeconomic History   Marital status: Married    Spouse name: Not on file   Number of children: 1   Years of education: Not on file   Highest education level: 9th grade  Occupational History   Occupation: retired   Occupation: Retired Psychologist, forensic  Tobacco Use   Smoking status: Former    Current packs/day: 0.00    Average packs/day: 2.0 packs/day for 20.0 years (40.0 ttl pk-yrs)    Types: Cigarettes    Start date: 25    Quit date: 1994    Years since quitting: 31.0    Passive exposure: Past   Smokeless tobacco: Never  Vaping Use   Vaping status: Never Used  Substance and Sexual Activity   Alcohol use: Not Currently    Comment: social-occ beer   Drug use: No   Sexual activity: Yes  Other Topics Concern   Not on file  Social History Narrative   Not on file   Social Drivers of Health   Financial Resource Strain: Low Risk  (11/02/2022)   Overall Financial Resource Strain (CARDIA)    Difficulty of Paying Living Expenses: Not hard at all  Food Insecurity: No Food Insecurity (11/02/2022)   Hunger Vital Sign    Worried About Running  Out of Food in the Last Year: Never true    Ran Out of Food in the Last Year: Never true  Transportation Needs: No Transportation Needs (11/02/2022)   PRAPARE - Administrator, Civil Service (Medical): No    Lack of Transportation (Non-Medical): No  Physical Activity: Insufficiently Active (11/02/2022)   Exercise Vital Sign    Days of Exercise per Week: 2 days    Minutes of Exercise per Session: 30 min  Stress: Stress Concern Present (11/02/2022)   [employer] of Occupational Health - Occupational Stress Questionnaire    Feeling of Stress : To some extent  Social Connections: Moderately Isolated (11/02/2022)   Social Connection and Isolation Panel [NHANES]  Frequency of Communication with Friends and Family: More than three times a week    Frequency of Social Gatherings with Friends and Family: Once a week    Attends Religious Services: Never    Database administrator or Organizations: No    Attends Engineer, structural: Not on file    Marital Status: Married  Catering manager Violence: Not At Risk (12/22/2021)   Humiliation, Afraid, Rape, and Kick questionnaire    Fear of Current or Ex-Partner: No    Emotionally Abused: No    Physically Abused: No    Sexually Abused: No    Family History  Problem Relation Age of Onset   Emphysema Mother        pulmonary   Heart attack Father 31   Lung cancer Sister    Esophageal cancer Brother    Esophageal cancer Brother    Colon cancer Neg Hx    Inflammatory bowel disease Neg Hx    Liver disease Neg Hx    Pancreatic cancer Neg Hx    Rectal cancer Neg Hx    Stomach cancer Neg Hx    Colon polyps Neg Hx    Sleep apnea Neg Hx     Review of Systems:  As stated in the HPI and otherwise negative.   BP (!) 142/76   Pulse 66   Ht 6' (1.829 m)   Wt 90 kg   SpO2 92%   BMI 26.91 kg/m   Physical Examination: General: Well developed, well nourished, NAD  HEENT: OP clear, mucus membranes moist  SKIN: warm, dry.  No rashes. Neuro: No focal deficits  Musculoskeletal: Muscle strength 5/5 all ext  Psychiatric: Mood and affect normal  Neck: No JVD, no carotid bruits, no thyromegaly, no lymphadenopathy.  Lungs:Clear bilaterally, no wheezes, rhonci, crackles Cardiovascular: Regular rate and rhythm. Loud, harsh, late peaking systolic murmur.  Abdomen:Soft. Bowel sounds present. Non-tender.  Extremities: Trace bilateral lower extremity edema. Pulses are 2 + in the bilateral DP/PT.  EKG:  EKG is ordered today. The ekg ordered today demonstrates  EKG Interpretation Date/Time:  Friday April 16 2023 09:44:52 EST Ventricular Rate:  66 PR Interval:  186 QRS Duration:  158 QT Interval:  464 QTC Calculation: 486 R Axis:   -68  Text Interpretation: Sinus rhythm with occasional Premature ventricular complexes Right bundle branch block Left anterior fascicular block Bifascicular block Confirmed by Verne Carrow [phone]) on 04/16/2023 10:12:26 AM    Echo 03/25/23:  1. Left ventricular ejection fraction, by estimation, is 55 to 60%. The  left ventricle has normal function. The left ventricle has no regional  wall motion abnormalities. Left ventricular diastolic parameters are  consistent with Grade I diastolic  dysfunction (impaired relaxation).   2. Right ventricular systolic function is normal. The right ventricular  size is normal. Tricuspid regurgitation signal is inadequate for assessing  PA pressure.   3. Left atrial size was mildly dilated.   4. The mitral valve is normal in structure. No evidence of mitral valve  regurgitation. No evidence of mitral stenosis.   5. The aortic valve is tricuspid. There is severe calcifcation of the  aortic valve. Aortic valve regurgitation is trivial. Paradoxical low  flow/low gradient severe aortic valve stenosis. Aortic valve area, by VTI  measures 0.76 cm. Aortic valve mean  gradient measures 31.0 mmHg.   6. The inferior vena cava is normal in size with  greater than 50%  respiratory variability, suggesting right atrial pressure of 3 mmHg.  FINDINGS   Left Ventricle: Left ventricular ejection fraction, by estimation, is 55  to 60%. The left ventricle has normal function. The left ventricle has no  regional wall motion abnormalities. The left ventricular internal cavity  size was normal in size. There is   no left ventricular hypertrophy. Left ventricular diastolic parameters  are consistent with Grade I diastolic dysfunction (impaired relaxation).   Right Ventricle: The right ventricular size is normal. No increase in  right ventricular wall thickness. Right ventricular systolic function is  normal. Tricuspid regurgitation signal is inadequate for assessing PA  pressure.   Left Atrium: Left atrial size was mildly dilated.   Right Atrium: Right atrial size was normal in size.   Pericardium: Trivial pericardial effusion is present.   Mitral Valve: The mitral valve is normal in structure. No evidence of  mitral valve regurgitation. No evidence of mitral valve stenosis.   Tricuspid Valve: The tricuspid valve is normal in structure. Tricuspid  valve regurgitation is not demonstrated.   Aortic Valve: The aortic valve is tricuspid. There is severe calcifcation  of the aortic valve. Aortic valve regurgitation is trivial. Aortic  regurgitation PHT measures 329 msec. Severe aortic stenosis is present.  Aortic valve mean gradient measures 31.0  mmHg. Aortic valve peak gradient measures 52.7 mmHg. Aortic valve area, by  VTI measures 0.76 cm.   Pulmonic Valve: The pulmonic valve was normal in structure. Pulmonic valve  regurgitation is not visualized.   Aorta: The aortic root is normal in size and structure.   Venous: The inferior vena cava is normal in size with greater than 50%  respiratory variability, suggesting right atrial pressure of 3 mmHg.   IAS/Shunts: No atrial level shunt detected by color flow Doppler.     LEFT  VENTRICLE  PLAX 2D  LVIDd:         4.70 cm   Diastology  LVIDs:         2.90 cm   LV e' medial:    6.20 cm/s  LV PW:         1.30 cm   LV E/e' medial:  8.7  LV IVS:        1.00 cm   LV e' lateral:   5.84 cm/s  LVOT diam:     2.10 cm   LV E/e' lateral: 9.2  LV SV:         57  LV SV Index:   27  LVOT Area:     3.46 cm                             3D Volume EF:                           3D EF:        56 %                           LV EDV:       121 ml                           LV ESV:       53 ml                           LV SV:  67 ml   RIGHT VENTRICLE  RV Basal diam:  2.00 cm  RV S prime:     14.30 cm/s  TAPSE (M-mode): 2.5 cm   LEFT ATRIUM             Index        RIGHT ATRIUM           Index  LA diam:        4.90 cm 2.32 cm/m   RA Area:     13.20 cm  LA Vol (A2C):   65.5 ml 30.99 ml/m  RA Volume:   24.70 ml  11.69 ml/m  LA Vol (A4C):   59.2 ml 28.01 ml/m  LA Biplane Vol: 65.0 ml 30.76 ml/m   AORTIC VALVE  AV Area (Vmax):    0.82 cm  AV Area (Vmean):   0.79 cm  AV Area (VTI):     0.76 cm  AV Vmax:           363.00 cm/s  AV Vmean:          255.000 cm/s  AV VTI:            0.749 m  AV Peak Grad:      52.7 mmHg  AV Mean Grad:      31.0 mmHg  LVOT Vmax:         86.30 cm/s  LVOT Vmean:        57.900 cm/s  LVOT VTI:          0.164 m  LVOT/AV VTI ratio: 0.22  AI PHT:            329 msec    AORTA  Ao Root diam: 3.00 cm  Ao Asc diam:  3.20 cm   MITRAL VALVE  MV Area (PHT): 3.25 cm     SHUNTS  MV Decel Time: 233 msec     Systemic VTI:  0.16 m  MV E velocity: 53.80 cm/s   Systemic Diam: 2.10 cm  MV A velocity: 115.67 cm/s  MV E/A ratio:  0.47   Recent Labs: 08/28/2022: NT-Pro BNP 74 12/10/2022: Hemoglobin 17.1; Platelets 133.0; TSH 1.85 12/18/2022: ALT 22; BUN 13; Creat 0.62; Potassium 4.0; Sodium 140    Wt Readings from Last 3 Encounters:  04/16/23 90 kg  04/14/23 89.8 kg  04/12/23 89.6 kg    Assessment and Plan:   1. Severe Aortic Valve Stenosis: He  has severe, stage D3 aortic valve stenosis. He has NYHA class 2 symptoms with progressive dyspnea that is clearly worsened over his baseline dyspnea that he has from pulmonary fibrosis. I have personally reviewed the echo images. The aortic valve is thickened and calcified with restricted leaflet mobility but the valve does seem to open reasonably well. I think his dyspnea is most likely a combination of his aortic stenosis and his pulmonary fibrosis. He would benefit from AVR. Given his advanced lung disease and cirrhosis, he is not a good candidate for conventional AVR by surgical approach. I think he may be a good candidate for TAVR.   I have reviewed the natural history of aortic stenosis with the patient and their family members  who are present today. We have discussed the limitations of medical therapy and the poor prognosis associated with symptomatic aortic stenosis. We have reviewed potential treatment options, including palliative medical therapy, conventional surgical aortic valve replacement, and transcatheter aortic valve replacement. We discussed treatment options in the context of the patient's specific comorbid medical conditions.  He would like to proceed with planning for TAVR. I will arrange a right and left heart catheterization at [facility] 04/23/23 at 9 am with Dr. Rosemary Holms. Risks and benefits of the cath procedure and the valve procedure are reviewed with the patient. After the cath, he will have a cardiac CT, CTA of the chest/abdomen and pelvis and will then be referred to see one of the CT surgeons on our TAVR team.   BMET and CBC today.     Labs/ tests ordered today include:   Orders Placed This Encounter  Procedures   Basic metabolic panel   CBC   EKG 12-Lead   Disposition:   F/U will be arranged with the structural team  Signed, Verne Carrow, MD, [facility] 04/16/2023 10:23 AM    [facility] [address] [place], [place], [place]  [address] Phone: [phone]; Fax: [phone]

## 2023-04-16 ENCOUNTER — Encounter (INDEPENDENT_AMBULATORY_CARE_PROVIDER_SITE_OTHER): Payer: Medicare Other | Admitting: Cardiovascular Disease

## 2023-04-16 ENCOUNTER — Encounter: Payer: Self-pay | Admitting: Cardiovascular Disease

## 2023-04-16 VITALS — BP 142/76 | HR 66 | Ht 72.0 in | Wt 198.4 lb

## 2023-04-16 DIAGNOSIS — J8410 Pulmonary fibrosis, unspecified: Secondary | ICD-10-CM | POA: Diagnosis not present

## 2023-04-16 DIAGNOSIS — J984 Other disorders of lung: Secondary | ICD-10-CM | POA: Diagnosis not present

## 2023-04-16 DIAGNOSIS — I083 Combined rheumatic disorders of mitral, aortic and tricuspid valves: Secondary | ICD-10-CM | POA: Diagnosis not present

## 2023-04-16 DIAGNOSIS — I2510 Atherosclerotic heart disease of native coronary artery without angina pectoris: Secondary | ICD-10-CM | POA: Diagnosis not present

## 2023-04-16 DIAGNOSIS — I350 Nonrheumatic aortic (valve) stenosis: Secondary | ICD-10-CM

## 2023-04-16 DIAGNOSIS — I10 Essential (primary) hypertension: Secondary | ICD-10-CM | POA: Diagnosis not present

## 2023-04-16 DIAGNOSIS — R0600 Dyspnea, unspecified: Secondary | ICD-10-CM | POA: Diagnosis not present

## 2023-04-16 NOTE — Progress Notes (Addendum)
Pre Surgical Assessment: 5 M Walk Test  42M=16.62ft  5 Meter Walk Test- trial 1: 4.32 seconds 5 Meter Walk Test- trial 2: 4.14 seconds 5 Meter Walk Test- trial 3: 4.09 seconds 5 Meter Walk Test Average: 4.18 seconds  _____________________   Procedure Type: Isolated AVR Perioperative Outcome Estimate % Operative Mortality 2.87% Morbidity & Mortality 12.8% Stroke 1.13% Renal Failure 1.12% Reoperation 6.17% Prolonged Ventilation 4.29% Deep Sternal Wound Infection 0.064% Long Hospital Stay (>14 days) 5.16% Short Hospital Stay (<6 days)* 41.4%

## 2023-04-16 NOTE — Patient Instructions (Signed)
Medication Instructions:  No changes *If you need a refill on your cardiac medications before your next appointment, please call your pharmacy*   Lab Work: Go to first floor [facility] today for blood work (bmet, cbc)   Testing/Procedures: Your physician has requested that you have a cardiac catheterization. Cardiac catheterization is used to diagnose and/or treat various heart conditions. Doctors may recommend this procedure for a number of different reasons. The most common reason is to evaluate chest pain. Chest pain can be a symptom of coronary artery disease (CAD), and cardiac catheterization can show whether plaque is narrowing or blocking your heart's arteries. This procedure is also used to evaluate the valves, as well as measure the blood flow and oxygen levels in different parts of your heart. For further information please visit [email or web address] Please follow instruction sheet, as given.   Follow-Up: Per Structural Heart Team       Cardiac/Peripheral Catheterization   You are scheduled for a Cardiac Catheterization on Friday, January 31 with Dr.  Rosemary Holms .  1. Please arrive at the [facility] (Main Entrance A) at [facility]: [address] [place], [place] [address] at 7:00 AM (This time is TWO hour(s) before your procedure to ensure your preparation).   Free valet parking service is available. You will check in at ADMITTING. The support person will be asked to wait in the waiting room.  It is OK to have someone drop you off and come back when you are ready to be discharged.        Special note: Every effort is made to have your procedure done on time. Please understand that emergencies sometimes delay scheduled procedures.  2. Diet: Do not eat solid foods after midnight.  You may have clear liquids until 5 AM the day of the procedure.  3. Labs: You will need to have blood drawn today.  You do not need to be fasting.  4. Medication instructions in preparation  for your procedure:   Contrast Allergy: No   No losartan-hydrochlorothiazide day of procedure  Do not take Diabetes Med Synjardy on the day of the procedure and HOLD 48 HOURS AFTER THE PROCEDURE.  On the morning of your procedure, take Plavix/Clopidogrel and any morning medicines NOT listed above.  You may use sips of water.  5. Plan to go home the same day, you will only stay overnight if medically necessary. 6. You MUST have a responsible adult to drive you home. 7. An adult MUST be with you the first 24 hours after you arrive home. 8. Bring a current list of your medications, and the last time and date medication taken. 9. Bring ID and current insurance cards. 10.Please wear clothes that are easy to get on and off and wear slip-on shoes.  Thank you for allowing [place] to care for you!   -- [place] [facility]

## 2023-04-17 LAB — CBC
Hematocrit: 49.1 % (ref 37.5–51.0)
Hemoglobin: 16.8 g/dL (ref 13.0–17.7)
MCH: 34.4 pg — ABNORMAL HIGH (ref 26.6–33.0)
MCHC: 34.2 g/dL (ref 31.5–35.7)
MCV: 101 fL — ABNORMAL HIGH (ref 79–97)
Platelets: 111 10*3/uL — ABNORMAL LOW (ref 150–450)
RBC: 4.88 x10E6/uL (ref 4.14–5.80)
RDW: 13.8 % (ref 11.6–15.4)
WBC: 5.7 10*3/uL (ref 3.4–10.8)

## 2023-04-17 LAB — BASIC METABOLIC PANEL
BUN/Creatinine Ratio: 22 (ref 10–24)
BUN: 13 mg/dL (ref 8–27)
CO2: 23 mmol/L (ref 20–29)
Calcium: 9.4 mg/dL (ref 8.6–10.2)
Chloride: 106 mmol/L (ref 96–106)
Creatinine, Ser: 0.6 mg/dL — ABNORMAL LOW (ref 0.76–1.27)
Glucose: 126 mg/dL — ABNORMAL HIGH (ref 70–99)
Potassium: 4.4 mmol/L (ref 3.5–5.2)
Sodium: 145 mmol/L — ABNORMAL HIGH (ref 134–144)
eGFR: 103 mL/min/{1.73_m2} (ref >59)

## 2023-04-20 ENCOUNTER — Encounter (HOSPITAL_COMMUNITY)
Admission: RE | Admit: 2023-04-20 | Discharge: 2023-04-20 | Disposition: A | Discharge status: Home / Self Care | Payer: Medicare Other | Source: Ambulatory Visit | Attending: Pulmonary Disease | Admitting: Pulmonary Disease

## 2023-04-20 DIAGNOSIS — J849 Interstitial pulmonary disease, unspecified: Secondary | ICD-10-CM

## 2023-04-20 DIAGNOSIS — J8410 Pulmonary fibrosis, unspecified: Secondary | ICD-10-CM | POA: Diagnosis not present

## 2023-04-20 DIAGNOSIS — I083 Combined rheumatic disorders of mitral, aortic and tricuspid valves: Secondary | ICD-10-CM | POA: Diagnosis not present

## 2023-04-20 DIAGNOSIS — J984 Other disorders of lung: Secondary | ICD-10-CM | POA: Diagnosis not present

## 2023-04-20 DIAGNOSIS — I2510 Atherosclerotic heart disease of native coronary artery without angina pectoris: Secondary | ICD-10-CM | POA: Diagnosis not present

## 2023-04-20 DIAGNOSIS — R0600 Dyspnea, unspecified: Secondary | ICD-10-CM | POA: Diagnosis not present

## 2023-04-20 DIAGNOSIS — I10 Essential (primary) hypertension: Secondary | ICD-10-CM | POA: Diagnosis not present

## 2023-04-20 LAB — GLUCOSE, CAPILLARY
Glucose-Capillary: 161 mg/dL — ABNORMAL HIGH (ref 70–99)
Glucose-Capillary: 152 mg/dL — ABNORMAL HIGH (ref 70–99)

## 2023-04-20 NOTE — Progress Notes (Signed)
Daily Session Note  Patient Details  Name: Male Minish MRN: [id number] Date of Birth: 14-Jan-1951 Referring Provider:   Doristine Devoid Pulmonary Rehab Walk Test from 04/14/2023 in [facility]  Referring Provider Mannam       Encounter Date: 04/20/2023  Check In:  Session Check In - 04/20/23 1055       Check-In   Supervising physician immediately available to respond to emergencies [facility] MD immediately available    Physician(s) Tereso Newcomer, NP    Location [facility]    Staff Present Raford Pitcher, MS, ACSM-CEP, Exercise Physiologist;Suzanne Garbers Gerre Scull, RN, BSN;Casey Sonnie Alamo, MS, ACSM-CEP, Exercise Physiologist    Virtual Visit No    Medication changes reported     No    Fall or balance concerns reported    No    Tobacco Cessation No Change    Warm-up and Cool-down Performed as group-led instruction    Resistance Training Performed Yes    VAD Patient? No    PAD/SET Patient? No      Pain Assessment   Currently in Pain? No/denies    Multiple Pain Sites No             Capillary Blood Glucose: Results for orders placed or performed during the hospital encounter of 04/14/23 (from the past 24 hours)  Glucose, capillary     Status: Abnormal   Collection Time: 04/20/23 10:13 AM  Result Value Ref Range   Glucose-Capillary 161 (H) 70 - 99 mg/dL      Social History   Tobacco Use  Smoking Status Former   Current packs/day: 0.00   Average packs/day: 2.0 packs/day for 20.0 years (40.0 ttl pk-yrs)   Types: Cigarettes   Start date: 62   Quit date: 74   Years since quitting: 31.0   Passive exposure: Past  Smokeless Tobacco Never    Goals Met:  Exercise tolerated well No report of concerns or symptoms today Strength training completed today  Goals Unmet:  Not Applicable  Comments: Service time is from 1006 to 1128    Dr. Mechele Collin is Medical Director for Pulmonary Rehab  at [facility].

## 2023-04-21 ENCOUNTER — Other Ambulatory Visit: Payer: Self-pay

## 2023-04-21 ENCOUNTER — Telehealth (HOSPITAL_COMMUNITY): Payer: Self-pay

## 2023-04-21 ENCOUNTER — Telehealth: Payer: Self-pay | Admitting: *Deleted

## 2023-04-21 DIAGNOSIS — J9611 Chronic respiratory failure with hypoxia: Secondary | ICD-10-CM

## 2023-04-21 NOTE — Telephone Encounter (Signed)
Dr. Isaiah Serge,  [name] needed 2L of oxygen during his 6 minute walk test in Pulmonary rehab. He currently does not have an oxygen prescription. I have attached the walk test to his notes.   Thanks, Durel Salts, RRT

## 2023-04-21 NOTE — Telephone Encounter (Signed)
Cardiac Catheterization scheduled at [facility] LOV:[facility] April 23, 2023 9 AM Arrival time [facility] Main Entrance A at: 7 AM  Nothing to eat after midnight prior to procedure, clear liquids until 5 AM day of procedure.  Medication instructions: -Hold:  Synjardy-day of procedure and 48 hours post procedure  Losartan/hydrochlorothiazide-AM of procedure -Other usual morning medications can be taken with sips of water including aspirin 81 mg and Plavix 75 mg.  Plan to go home the same day, you will only stay overnight if medically necessary.  You must have responsible adult to drive you home.  Someone must be with you the first 24 hours after you arrive home.  Left message for patient to call back to review procedure instructions

## 2023-04-22 ENCOUNTER — Encounter (HOSPITAL_COMMUNITY)
Admission: RE | Admit: 2023-04-22 | Discharge: 2023-04-22 | Disposition: A | Discharge status: Home / Self Care | Payer: Medicare Other | Source: Ambulatory Visit | Attending: Pulmonary Disease | Admitting: Pulmonary Disease

## 2023-04-22 VITALS — Wt 201.5 lb

## 2023-04-22 DIAGNOSIS — I10 Essential (primary) hypertension: Secondary | ICD-10-CM | POA: Diagnosis not present

## 2023-04-22 DIAGNOSIS — J8410 Pulmonary fibrosis, unspecified: Secondary | ICD-10-CM | POA: Diagnosis not present

## 2023-04-22 DIAGNOSIS — R0600 Dyspnea, unspecified: Secondary | ICD-10-CM | POA: Diagnosis not present

## 2023-04-22 DIAGNOSIS — I2510 Atherosclerotic heart disease of native coronary artery without angina pectoris: Secondary | ICD-10-CM | POA: Diagnosis not present

## 2023-04-22 DIAGNOSIS — J849 Interstitial pulmonary disease, unspecified: Secondary | ICD-10-CM

## 2023-04-22 DIAGNOSIS — I083 Combined rheumatic disorders of mitral, aortic and tricuspid valves: Secondary | ICD-10-CM | POA: Diagnosis not present

## 2023-04-22 DIAGNOSIS — J984 Other disorders of lung: Secondary | ICD-10-CM | POA: Diagnosis not present

## 2023-04-22 LAB — GLUCOSE, CAPILLARY
Glucose-Capillary: 202 mg/dL — ABNORMAL HIGH (ref 70–99)
Glucose-Capillary: 151 mg/dL — ABNORMAL HIGH (ref 70–99)

## 2023-04-22 NOTE — Telephone Encounter (Signed)
Reviewed procedure instructions with patient.

## 2023-04-22 NOTE — Progress Notes (Signed)
Daily Session Note  Patient Details  Name: Tyler Hendricks MRN: [id number] Date of Birth: 1950-04-14 Referring Provider:   Doristine Devoid Pulmonary Rehab Walk Test from 04/14/2023 in [facility]  Referring Provider Mannam       Encounter Date: 04/22/2023  Check In:  Session Check In - 04/22/23 1119       Check-In   Supervising physician immediately available to respond to emergencies [facility] MD immediately available    Physician(s) Edd Fabian, NP    Location [facility]    Staff Present Raford Pitcher, MS, ACSM-CEP, Exercise Physiologist;Mary Gerre Scull, RN, BSN;Casey Katrinka Blazing, RT;Randi Reeve BS, ACSM-CEP, Exercise Physiologist;Jetta Walker BS, ACSM-CEP, Exercise Physiologist    Virtual Visit No    Medication changes reported     No    Fall or balance concerns reported    No    Tobacco Cessation No Change    Warm-up and Cool-down Performed as group-led instruction    Resistance Training Performed Yes    VAD Patient? No    PAD/SET Patient? No      Pain Assessment   Currently in Pain? No/denies    Multiple Pain Sites No             Capillary Blood Glucose: Results for orders placed or performed during the hospital encounter of 04/20/23 (from the past 24 hours)  Glucose, capillary     Status: Abnormal   Collection Time: 04/22/23 10:13 AM  Result Value Ref Range   Glucose-Capillary 202 (H) 70 - 99 mg/dL  Glucose, capillary     Status: Abnormal   Collection Time: 04/22/23 11:31 AM  Result Value Ref Range   Glucose-Capillary 151 (H) 70 - 99 mg/dL      Social History   Tobacco Use  Smoking Status Former   Current packs/day: 0.00   Average packs/day: 2.0 packs/day for 20.0 years (40.0 ttl pk-yrs)   Types: Cigarettes   Start date: 48   Quit date: 80   Years since quitting: 31.1   Passive exposure: Past  Smokeless Tobacco Never    Goals Met:  Exercise tolerated well No report of concerns or  symptoms today Strength training completed today  Goals Unmet:  Not Applicable  Comments: Service time is from 1016 to 1148.    Dr. Mechele Collin is Medical Director for Pulmonary Rehab at [facility].

## 2023-04-22 NOTE — Telephone Encounter (Signed)
Left message for patient to call back to review instructions.

## 2023-04-23 ENCOUNTER — Encounter (HOSPITAL_COMMUNITY)
Admission: RE | Disposition: A | Discharge status: Home / Self Care | Payer: Self-pay | Source: Home / Self Care | Attending: Cardiology

## 2023-04-23 ENCOUNTER — Other Ambulatory Visit: Payer: Self-pay

## 2023-04-23 ENCOUNTER — Ambulatory Visit (HOSPITAL_COMMUNITY)
Admission: RE | Admit: 2023-04-23 | Discharge: 2023-04-23 | Disposition: A | Discharge status: Home / Self Care | Payer: Medicare Other | Attending: Cardiology | Admitting: Cardiology

## 2023-04-23 DIAGNOSIS — J8410 Pulmonary fibrosis, unspecified: Secondary | ICD-10-CM | POA: Diagnosis not present

## 2023-04-23 DIAGNOSIS — E119 Type 2 diabetes mellitus without complications: Secondary | ICD-10-CM | POA: Diagnosis not present

## 2023-04-23 DIAGNOSIS — Z8619 Personal history of other infectious and parasitic diseases: Secondary | ICD-10-CM | POA: Insufficient documentation

## 2023-04-23 DIAGNOSIS — I10 Essential (primary) hypertension: Secondary | ICD-10-CM | POA: Diagnosis not present

## 2023-04-23 DIAGNOSIS — E782 Mixed hyperlipidemia: Secondary | ICD-10-CM | POA: Diagnosis not present

## 2023-04-23 DIAGNOSIS — G4730 Sleep apnea, unspecified: Secondary | ICD-10-CM | POA: Diagnosis not present

## 2023-04-23 DIAGNOSIS — I2510 Atherosclerotic heart disease of native coronary artery without angina pectoris: Secondary | ICD-10-CM | POA: Diagnosis not present

## 2023-04-23 DIAGNOSIS — I350 Nonrheumatic aortic (valve) stenosis: Secondary | ICD-10-CM

## 2023-04-23 DIAGNOSIS — Z955 Presence of coronary angioplasty implant and graft: Secondary | ICD-10-CM | POA: Insufficient documentation

## 2023-04-23 DIAGNOSIS — Z21 Asymptomatic human immunodeficiency virus [HIV] infection status: Secondary | ICD-10-CM | POA: Insufficient documentation

## 2023-04-23 DIAGNOSIS — K7460 Unspecified cirrhosis of liver: Secondary | ICD-10-CM | POA: Insufficient documentation

## 2023-04-23 DIAGNOSIS — R0600 Dyspnea, unspecified: Secondary | ICD-10-CM | POA: Diagnosis not present

## 2023-04-23 DIAGNOSIS — Z87891 Personal history of nicotine dependence: Secondary | ICD-10-CM | POA: Diagnosis not present

## 2023-04-23 HISTORY — PX: RIGHT/LEFT HEART CATH AND CORONARY ANGIOGRAPHY: CATH118266

## 2023-04-23 LAB — GLUCOSE, CAPILLARY
Glucose-Capillary: 133 mg/dL — ABNORMAL HIGH (ref 70–99)
Glucose-Capillary: 89 mg/dL (ref 70–99)

## 2023-04-23 LAB — POCT I-STAT EG7
Acid-Base Excess: 0 mmol/L (ref 0.0–2.0)
Bicarbonate: 25.8 mmol/L (ref 20.0–28.0)
Calcium, Ion: 1.22 mmol/L (ref 1.15–1.40)
HCT: 42 % (ref 39.0–52.0)
Hemoglobin: 14.3 g/dL (ref 13.0–17.0)
O2 Saturation: 80 %
Potassium: 3.6 mmol/L (ref 3.5–5.1)
Sodium: 141 mmol/L (ref 135–145)
TCO2: 27 mmol/L (ref 22–32)
pCO2, Ven: 43.5 mm[Hg] — ABNORMAL LOW (ref 44–60)
pH, Ven: 7.381 (ref 7.25–7.43)
pO2, Ven: 45 mm[Hg] (ref 32–45)

## 2023-04-23 LAB — POCT ACTIVATED CLOTTING TIME: Activated Clotting Time: 181 s

## 2023-04-23 LAB — POCT I-STAT 7, (LYTES, BLD GAS, ICA,H+H)
Acid-base deficit: 2 mmol/L (ref 0.0–2.0)
Bicarbonate: 22.1 mmol/L (ref 20.0–28.0)
Calcium, Ion: 1.06 mmol/L — ABNORMAL LOW (ref 1.15–1.40)
HCT: 40 % (ref 39.0–52.0)
Hemoglobin: 13.6 g/dL (ref 13.0–17.0)
O2 Saturation: 97 %
Potassium: 3.3 mmol/L — ABNORMAL LOW (ref 3.5–5.1)
Sodium: 144 mmol/L (ref 135–145)
TCO2: 23 mmol/L (ref 22–32)
pCO2 arterial: 36.4 mm[Hg] (ref 32–48)
pH, Arterial: 7.391 (ref 7.35–7.45)
pO2, Arterial: 95 mm[Hg] (ref 83–108)

## 2023-04-23 SURGERY — RIGHT/LEFT HEART CATH AND CORONARY ANGIOGRAPHY
Anesthesia: LOCAL

## 2023-04-23 MED ORDER — HYDRALAZINE HCL 20 MG/ML IJ SOLN
INTRAMUSCULAR | Status: AC
  Administered 2023-04-23: 10 mg via INTRAVENOUS
  Filled 2023-04-23: qty 1

## 2023-04-23 MED ORDER — IOHEXOL 350 MG/ML SOLN
INTRAVENOUS | Status: DC | PRN
  Administered 2023-04-23: 55 mL

## 2023-04-23 MED ORDER — LIDOCAINE HCL (PF) 1 % IJ SOLN
INTRAMUSCULAR | Status: AC
  Filled 2023-04-23: qty 30

## 2023-04-23 MED ORDER — SODIUM CHLORIDE 0.9 % IV SOLN: 250.0000 mL | INTRAVENOUS | Status: DC | PRN

## 2023-04-23 MED ORDER — HEPARIN SODIUM (PORCINE) 1000 UNIT/ML IJ SOLN
INTRAMUSCULAR | Status: AC
  Filled 2023-04-23: qty 10

## 2023-04-23 MED ORDER — ONDANSETRON HCL 4 MG/2ML IJ SOLN: 4.0000 mg | Freq: Four times a day (QID) | INTRAMUSCULAR | Status: DC | PRN

## 2023-04-23 MED ORDER — ASPIRIN 81 MG PO CHEW: 81.0000 mg | CHEWABLE_TABLET | ORAL | Status: DC

## 2023-04-23 MED ORDER — HEPARIN SODIUM (PORCINE) 1000 UNIT/ML IJ SOLN
INTRAMUSCULAR | Status: DC | PRN
  Administered 2023-04-23: 5000 [IU] via INTRAVENOUS

## 2023-04-23 MED ORDER — SODIUM CHLORIDE 0.9% FLUSH
3.0000 mL | Freq: Two times a day (BID) | INTRAVENOUS | Status: DC
Start: 2023-04-23 — End: 2023-04-23

## 2023-04-23 MED ORDER — ACETAMINOPHEN 325 MG PO TABS
ORAL_TABLET | ORAL | Status: AC
  Filled 2023-04-23: qty 2

## 2023-04-23 MED ORDER — HYDRALAZINE HCL 20 MG/ML IJ SOLN: 10.0000 mg | INTRAMUSCULAR | Status: DC | PRN

## 2023-04-23 MED ORDER — ACETAMINOPHEN 325 MG PO TABS
650.0000 mg | ORAL_TABLET | ORAL | Status: DC | PRN
  Administered 2023-04-23: 650 mg via ORAL

## 2023-04-23 MED ORDER — HEPARIN (PORCINE) IN NACL 1000-0.9 UT/500ML-% IV SOLN
INTRAVENOUS | Status: DC | PRN
  Administered 2023-04-23 (×3): 500 mL via INTRA_ARTERIAL

## 2023-04-23 MED ORDER — SODIUM CHLORIDE 0.9 % WEIGHT BASED INFUSION
1.0000 mL/kg/h | INTRAVENOUS | Status: DC
  Administered 2023-04-23: 500 mL via INTRAVENOUS

## 2023-04-23 MED ORDER — LABETALOL HCL 5 MG/ML IV SOLN
INTRAVENOUS | Status: AC
  Filled 2023-04-23: qty 4

## 2023-04-23 MED ORDER — VERAPAMIL HCL 2.5 MG/ML IV SOLN
INTRAVENOUS | Status: DC | PRN
  Administered 2023-04-23: 10 mL via INTRA_ARTERIAL

## 2023-04-23 MED ORDER — SODIUM CHLORIDE 0.9% FLUSH: 3.0000 mL | INTRAVENOUS | Status: DC | PRN

## 2023-04-23 MED ORDER — MIDAZOLAM HCL 2 MG/2ML IJ SOLN
INTRAMUSCULAR | Status: DC | PRN
  Administered 2023-04-23 (×2): 1 mg via INTRAVENOUS

## 2023-04-23 MED ORDER — MIDAZOLAM HCL 2 MG/2ML IJ SOLN
INTRAMUSCULAR | Status: AC
  Filled 2023-04-23: qty 2

## 2023-04-23 MED ORDER — SODIUM CHLORIDE 0.9 % IV SOLN: INTRAVENOUS | Status: AC

## 2023-04-23 MED ORDER — FENTANYL CITRATE (PF) 100 MCG/2ML IJ SOLN
INTRAMUSCULAR | Status: AC
  Filled 2023-04-23: qty 2

## 2023-04-23 MED ORDER — SODIUM CHLORIDE 0.9 % WEIGHT BASED INFUSION: 3.0000 mL/kg/h | INTRAVENOUS | Status: AC

## 2023-04-23 MED ORDER — LIDOCAINE HCL (PF) 1 % IJ SOLN
INTRAMUSCULAR | Status: DC | PRN
  Administered 2023-04-23: 5 mL
  Administered 2023-04-23: 10 mL

## 2023-04-23 MED ORDER — LABETALOL HCL 5 MG/ML IV SOLN
10.0000 mg | INTRAVENOUS | Status: DC | PRN
  Administered 2023-04-23: 10 mg via INTRAVENOUS

## 2023-04-23 MED ORDER — VERAPAMIL HCL 2.5 MG/ML IV SOLN
INTRAVENOUS | Status: AC
  Filled 2023-04-23: qty 2

## 2023-04-23 MED ORDER — FENTANYL CITRATE (PF) 100 MCG/2ML IJ SOLN
INTRAMUSCULAR | Status: DC | PRN
  Administered 2023-04-23 (×3): 25 ug via INTRAVENOUS

## 2023-04-23 SURGICAL SUPPLY — 17 items
CATH BALLN WEDGE 5F 110CM (CATHETERS) IMPLANT
CATH INFINITI 5 FR JL3.5 (CATHETERS) IMPLANT
CATH INFINITI AMBI 5FR TG (CATHETERS) IMPLANT
DEVICE RAD COMP TR BAND LRG (VASCULAR PRODUCTS) IMPLANT
GLIDESHEATH SLEND A-KIT 6F 22G (SHEATH) IMPLANT
GUIDEWIRE .025 260CM (WIRE) IMPLANT
GUIDEWIRE INQWIRE 1.5J.035X260 (WIRE) IMPLANT
INQWIRE 1.5J .035X260CM (WIRE) ×1 IMPLANT
KIT SYRINGE INJ CVI SPIKEX1 (MISCELLANEOUS) IMPLANT
PACK CARDIAC CATHETERIZATION (CUSTOM PROCEDURE TRAY) ×1 IMPLANT
PROTECTION STATION PRESSURIZED (MISCELLANEOUS) ×1 IMPLANT
SET ATX-X65L (MISCELLANEOUS) IMPLANT
SHEATH GLIDE SLENDER 4/5FR (SHEATH) IMPLANT
SHEATH PINNACLE 5F 10CM (SHEATH) IMPLANT
SHEATH PROBE COVER 6X72 (BAG) IMPLANT
STATION PROTECTION PRESSURIZED (MISCELLANEOUS) IMPLANT
WIRE MICRO SET SILHO 5FR 7 (SHEATH) IMPLANT

## 2023-04-23 NOTE — Progress Notes (Signed)
ACT result 181, MD notified, verbal order given OK to pull sheath with 5 minutes of additional manual pressure.

## 2023-04-23 NOTE — H&P (Signed)
OV 04/16/2023 copied for documentation    Structural Heart Clinic Consult Note  No chief complaint on file.  History of Present Illness: 73 yo male with history of HIV, HTN, HLD, cirrhosis, Hepatitis B, pulmonary fibrosis, CAD, DM, carotid artery disease and aortic stenosis who is here today as a new consult, referred by Dr. Rosemary Holms, for further discussion regarding his aortic stenosis and possible TAVR. He had been followed in our office by Dr. Eldridge Dace since 2019 after moving to [facility] from [place]. He has CAD and has had prior stenting of the LAD, obtuse marginal branch, left posterolateral branch and diagonal branch, all done in [place]. He has had HIV since 1989 with undetectable viral load. His pulmonary fibrosis is followed in the pulmonary clinic. He has sleep apnea but does not use CPAP. He has cirrhosis and mild esophageal varices.   He has been followed for moderate aortic stenosis since 2018. Echo January 2025 with LVEF=55-60%, grade 1 diastolic dysfunction. Heavily calcified aortic valve with likely paradoxical low flow/low gradient moderate to severe aortic stenosis with mean gradient of 31 mmHg, AVA 0.76 cm2, DI 0.22, SVI 27. His valve appears to open reasonably well on visual inspection of the valve.   He tells me today that he has progressive dyspnea. He has no chest pain, dizziness or near syncope. Mild LE edema. He lives in [place], [place] with his wife. He is retired from Programme researcher, broadcasting/film/video. He has full dentures.   Primary Care Physician: Myrlene Broker, MD Primary Cardiologist: Rosemary Holms Referring Cardiologist: Rosemary Holms  Past Medical History:  Diagnosis Date   Abnormal result of cardiovascular function study, unspecified    Atherosclerotic heart disease of native coronary artery without angina pectoris    CAD (coronary artery disease)    Coronary angioplasty status    Diabetes mellitus without complication (HCC)    Essential (primary) hypertension    Gallstones     Hepatic cirrhosis (HCC)    Hepatitis B core antibody positive 01/08/2019   Hepatitis C    HIV infection (HCC)    Hyperlipidemia    Hypertension    Internal hemorrhoids    Kidney stones    Lung disease    Mixed hyperlipidemia    Palpitations    Sleep apnea    CPAP   Tubular adenoma of colon     Past Surgical History:  Procedure Laterality Date   CARDIAC CATHETERIZATION Left 04/2013   chlecystectomy     CHOLECYSTECTOMY     COLONOSCOPY  05/02/2013   GAS/FLUID EXCHANGE Right 12/30/2021   Procedure: GAS/FLUID EXCHANGE;  Surgeon: Carmela Rima, MD;  Location: [facility] OR;  Service: Ophthalmology;  Laterality: Right;  SF6   PARS PLANA VITRECTOMY Right 12/30/2021   Procedure: PARS PLANA VITRECTOMY WITH 25 GAUGE;  Surgeon: Carmela Rima, MD;  Location: [facility] OR;  Service: Ophthalmology;  Laterality: Right;   PERCUTANEOUS CORONARY STENT INTERVENTION (PCI-S)     PHOTOCOAGULATION WITH LASER Right 12/30/2021   Procedure: PHOTOCOAGULATION WITH LASER;  Surgeon: Carmela Rima, MD;  Location: [facility] OR;  Service: Ophthalmology;  Laterality: Right;   POLYPECTOMY     UMBILICAL HERNIA REPAIR      Current Facility-Administered Medications  Medication Dose Route Frequency Provider Last Rate Last Admin   0.9% sodium chloride infusion  1 mL/kg/hr Intravenous Continuous Kathleene Hazel, MD       aspirin chewable tablet 81 mg  81 mg Oral Pre-Cath Kashton Mcartor, Anabel Bene, MD        Allergies  Allergen Reactions  Integrilin [Eptifibatide] Other (See Comments)    Bleeding (non-specific)   Lopressor [Metoprolol Tartrate] Rash and Other (See Comments)    Bleeding (non-specific)    Penicillins Diarrhea and Rash    Social History   Socioeconomic History   Marital status: Married    Spouse name: Not on file   Number of children: 1   Years of education: Not on file   Highest education level: 9th grade  Occupational History   Occupation: retired   Occupation: Retired Psychologist, forensic   Tobacco Use   Smoking status: Former    Current packs/day: 0.00    Average packs/day: 2.0 packs/day for 20.0 years (40.0 ttl pk-yrs)    Types: Cigarettes    Start date: 51    Quit date: 1994    Years since quitting: 31.1    Passive exposure: Past   Smokeless tobacco: Never  Vaping Use   Vaping status: Never Used  Substance and Sexual Activity   Alcohol use: Not Currently    Comment: social-occ beer   Drug use: No   Sexual activity: Yes  Other Topics Concern   Not on file  Social History Narrative   Not on file   Social Drivers of Health   Financial Resource Strain: Low Risk  (11/02/2022)   Overall Financial Resource Strain (CARDIA)    Difficulty of Paying Living Expenses: Not hard at all  Food Insecurity: No Food Insecurity (11/02/2022)   Hunger Vital Sign    Worried About Running Out of Food in the Last Year: Never true    Ran Out of Food in the Last Year: Never true  Transportation Needs: No Transportation Needs (11/02/2022)   PRAPARE - Administrator, Civil Service (Medical): No    Lack of Transportation (Non-Medical): No  Physical Activity: Insufficiently Active (11/02/2022)   Exercise Vital Sign    Days of Exercise per Week: 2 days    Minutes of Exercise per Session: 30 min  Stress: Stress Concern Present (11/02/2022)   [employer] of Occupational Health - Occupational Stress Questionnaire    Feeling of Stress : To some extent  Social Connections: Moderately Isolated (11/02/2022)   Social Connection and Isolation Panel [NHANES]    Frequency of Communication with Friends and Family: More than three times a week    Frequency of Social Gatherings with Friends and Family: Once a week    Attends Religious Services: Never    Database administrator or Organizations: No    Attends Engineer, structural: Not on file    Marital Status: Married  Catering manager Violence: Not At Risk (12/22/2021)   Humiliation, Afraid, Rape, and Kick questionnaire     Fear of Current or Ex-Partner: No    Emotionally Abused: No    Physically Abused: No    Sexually Abused: No    Family History  Problem Relation Age of Onset   Emphysema Mother        pulmonary   Heart attack Father 23   Lung cancer Sister    Esophageal cancer Brother    Esophageal cancer Brother    Colon cancer Neg Hx    Inflammatory bowel disease Neg Hx    Liver disease Neg Hx    Pancreatic cancer Neg Hx    Rectal cancer Neg Hx    Stomach cancer Neg Hx    Colon polyps Neg Hx    Sleep apnea Neg Hx     Review of Systems:  As  stated in the HPI and otherwise negative.   BP (!) 157/82   Pulse 73   Temp 98.7 F (37.1 C) (Oral)   Resp 18   Ht 6' (1.829 m)   Wt 94.8 kg   SpO2 98%   BMI 28.35 kg/m   Physical Examination: General: Well developed, well nourished, NAD  HEENT: OP clear, mucus membranes moist  SKIN: warm, dry. No rashes. Neuro: No focal deficits  Musculoskeletal: Muscle strength 5/5 all ext  Psychiatric: Mood and affect normal  Neck: No JVD, no carotid bruits, no thyromegaly, no lymphadenopathy.  Lungs:Clear bilaterally, no wheezes, rhonci, crackles Cardiovascular: Regular rate and rhythm. Loud, harsh, late peaking systolic murmur.  Abdomen:Soft. Bowel sounds present. Non-tender.  Extremities: Trace bilateral lower extremity edema. Pulses are 2 + in the bilateral DP/PT.  EKG:  EKG is ordered today. The ekg ordered today demonstrates       Echo 03/25/23:  1. Left ventricular ejection fraction, by estimation, is 55 to 60%. The  left ventricle has normal function. The left ventricle has no regional  wall motion abnormalities. Left ventricular diastolic parameters are  consistent with Grade I diastolic  dysfunction (impaired relaxation).   2. Right ventricular systolic function is normal. The right ventricular  size is normal. Tricuspid regurgitation signal is inadequate for assessing  PA pressure.   3. Left atrial size was mildly dilated.   4. The  mitral valve is normal in structure. No evidence of mitral valve  regurgitation. No evidence of mitral stenosis.   5. The aortic valve is tricuspid. There is severe calcifcation of the  aortic valve. Aortic valve regurgitation is trivial. Paradoxical low  flow/low gradient severe aortic valve stenosis. Aortic valve area, by VTI  measures 0.76 cm. Aortic valve mean  gradient measures 31.0 mmHg.   6. The inferior vena cava is normal in size with greater than 50%  respiratory variability, suggesting right atrial pressure of 3 mmHg.   FINDINGS   Left Ventricle: Left ventricular ejection fraction, by estimation, is 55  to 60%. The left ventricle has normal function. The left ventricle has no  regional wall motion abnormalities. The left ventricular internal cavity  size was normal in size. There is   no left ventricular hypertrophy. Left ventricular diastolic parameters  are consistent with Grade I diastolic dysfunction (impaired relaxation).   Right Ventricle: The right ventricular size is normal. No increase in  right ventricular wall thickness. Right ventricular systolic function is  normal. Tricuspid regurgitation signal is inadequate for assessing PA  pressure.   Left Atrium: Left atrial size was mildly dilated.   Right Atrium: Right atrial size was normal in size.   Pericardium: Trivial pericardial effusion is present.   Mitral Valve: The mitral valve is normal in structure. No evidence of  mitral valve regurgitation. No evidence of mitral valve stenosis.   Tricuspid Valve: The tricuspid valve is normal in structure. Tricuspid  valve regurgitation is not demonstrated.   Aortic Valve: The aortic valve is tricuspid. There is severe calcifcation  of the aortic valve. Aortic valve regurgitation is trivial. Aortic  regurgitation PHT measures 329 msec. Severe aortic stenosis is present.  Aortic valve mean gradient measures 31.0  mmHg. Aortic valve peak gradient measures 52.7 mmHg.  Aortic valve area, by  VTI measures 0.76 cm.   Pulmonic Valve: The pulmonic valve was normal in structure. Pulmonic valve  regurgitation is not visualized.   Aorta: The aortic root is normal in size and structure.   Venous:  The inferior vena cava is normal in size with greater than 50%  respiratory variability, suggesting right atrial pressure of 3 mmHg.   IAS/Shunts: No atrial level shunt detected by color flow Doppler.     LEFT VENTRICLE  PLAX 2D  LVIDd:         4.70 cm   Diastology  LVIDs:         2.90 cm   LV e' medial:    6.20 cm/s  LV PW:         1.30 cm   LV E/e' medial:  8.7  LV IVS:        1.00 cm   LV e' lateral:   5.84 cm/s  LVOT diam:     2.10 cm   LV E/e' lateral: 9.2  LV SV:         57  LV SV Index:   27  LVOT Area:     3.46 cm                             3D Volume EF:                           3D EF:        56 %                           LV EDV:       121 ml                           LV ESV:       53 ml                           LV SV:        67 ml   RIGHT VENTRICLE  RV Basal diam:  2.00 cm  RV S prime:     14.30 cm/s  TAPSE (M-mode): 2.5 cm   LEFT ATRIUM             Index        RIGHT ATRIUM           Index  LA diam:        4.90 cm 2.32 cm/m   RA Area:     13.20 cm  LA Vol (A2C):   65.5 ml 30.99 ml/m  RA Volume:   24.70 ml  11.69 ml/m  LA Vol (A4C):   59.2 ml 28.01 ml/m  LA Biplane Vol: 65.0 ml 30.76 ml/m   AORTIC VALVE  AV Area (Vmax):    0.82 cm  AV Area (Vmean):   0.79 cm  AV Area (VTI):     0.76 cm  AV Vmax:           363.00 cm/s  AV Vmean:          255.000 cm/s  AV VTI:            0.749 m  AV Peak Grad:      52.7 mmHg  AV Mean Grad:      31.0 mmHg  LVOT Vmax:         86.30 cm/s  LVOT Vmean:        57.900 cm/s  LVOT VTI:  0.164 m  LVOT/AV VTI ratio: 0.22  AI PHT:            329 msec    AORTA  Ao Root diam: 3.00 cm  Ao Asc diam:  3.20 cm   MITRAL VALVE  MV Area (PHT): 3.25 cm     SHUNTS  MV Decel Time: 233 msec      Systemic VTI:  0.16 m  MV E velocity: 53.80 cm/s   Systemic Diam: 2.10 cm  MV A velocity: 115.67 cm/s  MV E/A ratio:  0.47   Recent Labs: 08/28/2022: NT-Pro BNP 74 12/10/2022: TSH 1.85 12/18/2022: ALT 22 04/16/2023: BUN 13; Creatinine, Ser 0.60; Hemoglobin 16.8; Platelets 111; Potassium 4.4; Sodium 145    Wt Readings from Last 3 Encounters:  04/23/23 94.8 kg  04/16/23 90 kg  04/14/23 89.8 kg    Assessment and Plan:   1. Severe Aortic Valve Stenosis: He has severe, stage D3 aortic valve stenosis. He has NYHA class 2 symptoms with progressive dyspnea that is clearly worsened over his baseline dyspnea that he has from pulmonary fibrosis. I have personally reviewed the echo images. The aortic valve is thickened and calcified with restricted leaflet mobility but the valve does seem to open reasonably well. I think his dyspnea is most likely a combination of his aortic stenosis and his pulmonary fibrosis. He would benefit from AVR. Given his advanced lung disease and cirrhosis, he is not a good candidate for conventional AVR by surgical approach. I think he may be a good candidate for TAVR.   I have reviewed the natural history of aortic stenosis with the patient and their family members  who are present today. We have discussed the limitations of medical therapy and the poor prognosis associated with symptomatic aortic stenosis. We have reviewed potential treatment options, including palliative medical therapy, conventional surgical aortic valve replacement, and transcatheter aortic valve replacement. We discussed treatment options in the context of the patient's specific comorbid medical conditions.   He would like to proceed with planning for TAVR. I will arrange a right and left heart catheterization at [facility] 04/23/23 at 9 am with Dr. Rosemary Holms. Risks and benefits of the cath procedure and the valve procedure are reviewed with the patient. After the cath, he will have a cardiac CT, CTA of the  chest/abdomen and pelvis and will then be referred to see one of the CT surgeons on our TAVR team.   BMET and CBC today.     Labs/ tests ordered today include:   Orders Placed This Encounter  Procedures   Glucose, capillary   Diet NPO time specified Except for: Sips with Meds   Informed Consent Details: Physician/Practitioner Attestation; Transcribe to consent form and obtain patient signature   Apply Cardiac or Vascular Catheterization and/or Intervention Care Plan   Confirm CBC and BMP (or CMP) results within 7 days for inpatient and 30 days for outpatient: Outpatients with severe anemia (hgb<10, CKD, severe thrombocytopenia plts<100) labs should be within 10 days. Only draw PT/INR on patients that are on Coumadin, Hgb<10, have liver disease (cirrhosis, liver CA, hepatitis, etc). Urine pregnancy test within hospital admission for inpatients of child bearing age, for outpatients day of procedure.   Confirm EKG performed within 30 days for cardiac procedures and 12 months for peripheral vascular procedures.  Place order for EKG if missing or not within timeframe.   Verify aspirin and / or anti-platelet medication (Plavix, Effient, Brilinta) dose available for cardiac / peripheral vascular procedure day.  IF ordered daily / once, adjust schedule to administer before procedure.   Weigh patient   Initiate Cath/PCI clinical path; encourage patient to watch CCTV video   Clip R groin   Clip R radial (no IV/bracelet R wrist)   Insert peripheral IV   Insert 2nd peripheral IV site-Saline lock IV   Disposition:   F/U will be arranged with the structural team  Signed, Verne Carrow, MD, [facility] 04/23/2023 8:35 AM    [facility] [address] [place], [place], [place]  [address] Phone: [phone]; Fax: [phone]

## 2023-04-23 NOTE — Discharge Instructions (Signed)
NO METFORMIN FOR 2 DAYS 

## 2023-04-23 NOTE — Progress Notes (Signed)
Site area: R Groin (68fr Venous x 1) Site Prior to Removal:  Level 0 Pressure Applied For: 20 min Manual:   Yes Patient Status During Pull:  Stable Post Pull Site:  Level 0 Post Pull Instructions Given:  Yes Post Pull Pulses Present: Yes R DP +2 Dressing Applied: gauze & tegaderm  Bedrest begins @ 1250 Comments: Pt reports no pain on palpation of site, bleeding controlled, area soft.  Pt verbalized understanding of post pull instructions, no questions.  NAD, will continue to monitor.

## 2023-04-23 NOTE — Interval H&P Note (Signed)
History and Physical Interval Note:  04/23/2023 8:38 AM  Tyler Hendricks  has presented today for surgery, with the diagnosis of AS.  The various methods of treatment have been discussed with the patient and family. After consideration of risks, benefits and other options for treatment, the patient has consented to  Procedure(s): RIGHT/LEFT HEART CATH AND CORONARY ANGIOGRAPHY (N/A) as a surgical intervention.  The patient's history has been reviewed, patient examined, no change in status, stable for surgery.  I have reviewed the patient's chart and labs.  Questions were answered to the patient's satisfaction.     Shantara Goosby J Carla Rashad

## 2023-04-23 NOTE — Progress Notes (Signed)
Up and walked and tolerated well; right groin stable, no bleeding or hematoma 

## 2023-04-25 LAB — POCT I-STAT EG7
Acid-Base Excess: 0 mmol/L (ref 0.0–2.0)
Acid-Base Excess: 0 mmol/L (ref 0.0–2.0)
Acid-base deficit: 1 mmol/L (ref 0.0–2.0)
Bicarbonate: 24.3 mmol/L (ref 20.0–28.0)
Bicarbonate: 25.4 mmol/L (ref 20.0–28.0)
Bicarbonate: 25.6 mmol/L (ref 20.0–28.0)
Calcium, Ion: 1.13 mmol/L — ABNORMAL LOW (ref 1.15–1.40)
Calcium, Ion: 1.2 mmol/L (ref 1.15–1.40)
Calcium, Ion: 1.22 mmol/L (ref 1.15–1.40)
HCT: 42 % (ref 39.0–52.0)
HCT: 43 % (ref 39.0–52.0)
HCT: 44 % (ref 39.0–52.0)
Hemoglobin: 14.3 g/dL (ref 13.0–17.0)
Hemoglobin: 14.6 g/dL (ref 13.0–17.0)
Hemoglobin: 15 g/dL (ref 13.0–17.0)
O2 Saturation: 80 %
O2 Saturation: 81 %
O2 Saturation: 82 %
Potassium: 3.4 mmol/L — ABNORMAL LOW (ref 3.5–5.1)
Potassium: 3.6 mmol/L (ref 3.5–5.1)
Potassium: 3.7 mmol/L (ref 3.5–5.1)
Sodium: 135 mmol/L (ref 135–145)
Sodium: 141 mmol/L (ref 135–145)
Sodium: 142 mmol/L (ref 135–145)
TCO2: 26 mmol/L (ref 22–32)
TCO2: 27 mmol/L (ref 22–32)
TCO2: 27 mmol/L (ref 22–32)
pCO2, Ven: 42.2 mm[Hg] — ABNORMAL LOW (ref 44–60)
pCO2, Ven: 42.4 mm[Hg] — ABNORMAL LOW (ref 44–60)
pCO2, Ven: 43.5 mm[Hg] — ABNORMAL LOW (ref 44–60)
pH, Ven: 7.356 (ref 7.25–7.43)
pH, Ven: 7.385 (ref 7.25–7.43)
pH, Ven: 7.391 (ref 7.25–7.43)
pO2, Ven: 45 mm[Hg] (ref 32–45)
pO2, Ven: 45 mm[Hg] (ref 32–45)
pO2, Ven: 49 mm[Hg] — ABNORMAL HIGH (ref 32–45)

## 2023-04-26 ENCOUNTER — Encounter (HOSPITAL_COMMUNITY): Payer: Self-pay | Admitting: Cardiology

## 2023-04-27 ENCOUNTER — Encounter (HOSPITAL_COMMUNITY): Admission: RE | Admit: 2023-04-27 | Payer: Medicare Other | Source: Ambulatory Visit

## 2023-04-28 ENCOUNTER — Telehealth: Payer: Self-pay | Admitting: Pulmonary Disease

## 2023-04-28 DIAGNOSIS — R053 Chronic cough: Secondary | ICD-10-CM

## 2023-04-28 NOTE — Telephone Encounter (Signed)
 Patient would like a refill on his cough syrup .  [facility] [address]

## 2023-04-28 NOTE — Telephone Encounter (Signed)
 Patient is calling for an update on his medication refill.

## 2023-04-29 ENCOUNTER — Encounter (HOSPITAL_COMMUNITY)
Admission: RE | Admit: 2023-04-29 | Discharge: 2023-04-29 | Disposition: A | Discharge status: Home / Self Care | Payer: Medicare Other | Source: Ambulatory Visit | Attending: Pulmonary Disease | Admitting: Pulmonary Disease

## 2023-04-29 DIAGNOSIS — J849 Interstitial pulmonary disease, unspecified: Secondary | ICD-10-CM | POA: Insufficient documentation

## 2023-04-29 MED ORDER — HYDROCODONE BIT-HOMATROP MBR 5-1.5 MG/5ML PO SOLN: 5.0000 mL | Freq: Four times a day (QID) | ORAL | 0 refills | Status: DC | PRN

## 2023-04-29 NOTE — Progress Notes (Signed)
 Daily Session Note  Patient Details  Name: Tyler Hendricks MRN: [id number] Date of Birth: 1950-10-20 Referring Provider:   Conrad Ports Pulmonary Rehab Walk Test from 04/14/2023 in [facility] for Heart, Vascular, & Lung Health  Referring Provider Mannam       Encounter Date: 04/29/2023  Check In:  Session Check In - 04/29/23 1031       Check-In   Supervising physician immediately available to respond to emergencies [facility] MD immediately available    Physician(s) Rosabel Mose, NP    Location [facility]    Staff Present Ronal Levin, RN, BSN;Randi Midge BS, ACSM-CEP, Exercise Physiologist;Revanth Neidig Nicholaus, MS, ACSM-CEP, Exercise Physiologist;Casey Claudene, RT    Virtual Visit No    Medication changes reported     No    Fall or balance concerns reported    No    Tobacco Cessation No Change    Warm-up and Cool-down Performed as group-led instruction    Resistance Training Performed Yes    VAD Patient? No    PAD/SET Patient? No      Pain Assessment   Currently in Pain? No/denies    Multiple Pain Sites No             Capillary Blood Glucose: No results found for this or any previous visit (from the past 24 hours).    Social History   Tobacco Use  Smoking Status Former   Current packs/day: 0.00   Average packs/day: 2.0 packs/day for 20.0 years (40.0 ttl pk-yrs)   Types: Cigarettes   Start date: 44   Quit date: 44   Years since quitting: 31.1   Passive exposure: Past  Smokeless Tobacco Never    Goals Met:  Proper associated with RPD/PD & O2 Sat Exercise tolerated well No report of concerns or symptoms today Strength training completed today  Goals Unmet:  Not Applicable  Comments: Service time is from 1005 to 1130.    Dr. Slater Staff is Medical Director for Pulmonary Rehab at [facility].

## 2023-04-29 NOTE — Telephone Encounter (Signed)
 Lm for patient.

## 2023-04-29 NOTE — Telephone Encounter (Signed)
 Notified patient that medication was sent in.

## 2023-04-29 NOTE — Telephone Encounter (Signed)
 Spoke to patient. He is requesting refill on Hycodan  1.5mg  for persistent cough. Cough is prod with white sputum and worsens at night.  last refilled 04/05/2023 #[id number].  JD, please advise. Dr. Waylan Haggard is unavailable.

## 2023-04-29 NOTE — Telephone Encounter (Signed)
 Noted.  Will close encounter.

## 2023-05-03 ENCOUNTER — Telehealth: Payer: Self-pay | Admitting: Pulmonary Disease

## 2023-05-03 DIAGNOSIS — R053 Chronic cough: Secondary | ICD-10-CM

## 2023-05-03 NOTE — Telephone Encounter (Signed)
 PT called again asking if we go this filled. I told him we were still waiting on Dr. Margeret Sheer

## 2023-05-03 NOTE — Telephone Encounter (Signed)
 Spoke to patient. He stated that [facility]. [facility] will [facility] to [facility] that [facility] it in stock. He will call back.

## 2023-05-03 NOTE — Telephone Encounter (Signed)
 [facility] for [facility] to [facility]  is out of stock.

## 2023-05-03 NOTE — Telephone Encounter (Signed)
 Pls see last signed encounter. Pharm it was assigned to was out of this medication. PT wants it to go to the following Pharm:  [facility] [address] [facility]   PT's # is [phone]

## 2023-05-03 NOTE — Telephone Encounter (Signed)
 Patient states [facility] on [address] Has the medication. Patient phone number is [phone].

## 2023-05-03 NOTE — Telephone Encounter (Signed)
 Sp[facility] and she confirmed that Hycodan  is out of stock.  Dr. Waylan Haggard please see Rx to [facility]

## 2023-05-04 ENCOUNTER — Other Ambulatory Visit: Payer: Self-pay

## 2023-05-04 ENCOUNTER — Encounter (HOSPITAL_COMMUNITY)
Admission: RE | Admit: 2023-05-04 | Discharge: 2023-05-04 | Disposition: A | Discharge status: Home / Self Care | Payer: Medicare Other | Source: Ambulatory Visit | Attending: Pulmonary Disease

## 2023-05-04 DIAGNOSIS — J849 Interstitial pulmonary disease, unspecified: Secondary | ICD-10-CM | POA: Diagnosis not present

## 2023-05-04 MED ORDER — HYDROCODONE BIT-HOMATROP MBR 5-1.5 MG/5ML PO SOLN: 5.0000 mL | Freq: Four times a day (QID) | ORAL | 0 refills | Status: DC | PRN

## 2023-05-04 NOTE — Telephone Encounter (Signed)
Medication has been refilled.Voice mail left on patients phone

## 2023-05-04 NOTE — Progress Notes (Signed)
Daily Session Note  Patient Details  Name: Tyler Hendricks MRN: [id number] Date of Birth: 1950-08-19 Referring Provider:   Doristine Devoid Pulmonary Rehab Walk Test from 04/14/2023 in [facility]  Referring Provider Mannam       Encounter Date: 05/04/2023  Check In:  Session Check In - 05/04/23 1126       Check-In   Supervising physician immediately available to respond to emergencies [facility] MD immediately available    Physician(s) Hoover Browns, NP    Location [facility]    Staff Present Essie Hart, RN, BSN;Randi Idelle Crouch BS, ACSM-CEP, Exercise Physiologist;Kaylee Earlene Plater, MS, ACSM-CEP, Exercise Physiologist;Casey Katrinka Blazing, RT    Virtual Visit No    Medication changes reported     No    Fall or balance concerns reported    No    Tobacco Cessation No Change    Warm-up and Cool-down Performed as group-led instruction    Resistance Training Performed Yes    VAD Patient? No    PAD/SET Patient? No      Pain Assessment   Currently in Pain? No/denies    Multiple Pain Sites No             Capillary Blood Glucose: No results found for this or any previous visit (from the past 24 hours).    Social History   Tobacco Use  Smoking Status Former   Current packs/day: 0.00   Average packs/day: 2.0 packs/day for 20.0 years (40.0 ttl pk-yrs)   Types: Cigarettes   Start date: 63   Quit date: 23   Years since quitting: 31.1   Passive exposure: Past  Smokeless Tobacco Never    Goals Met:  Exercise tolerated well No report of concerns or symptoms today Strength training completed today  Goals Unmet:  Not Applicable  Comments: Service time is from 1010 to 1135    Dr. Mechele Collin is Medical Director for Pulmonary Rehab at [facility].

## 2023-05-04 NOTE — Progress Notes (Signed)
Specialty Pharmacy Ongoing Clinical Assessment Note  Tyler Hendricks is a 73 y.o. male who is being followed by the specialty pharmacy service for RxSp HIV   Patient's specialty medication(s) reviewed today: Bictegravir-Emtricitab-Tenofov (Biktarvy)   Missed doses in the last 4 weeks: 0   Patient/Caregiver did not have any additional questions or concerns.   Therapeutic benefit summary: Patient is achieving benefit   Adverse events/side effects summary: No adverse events/side effects   Patient's therapy is appropriate to: Continue    Goals Addressed             This Visit's Progress    Achieve Undetectable HIV Viral Load < 20       Patient is on track. Patient will maintain adherence         Follow up:  6 months  Otto Herb Specialty Pharmacist

## 2023-05-04 NOTE — Telephone Encounter (Signed)
Patient checking on cough syrup. Patient phone number is [phone].

## 2023-05-04 NOTE — Telephone Encounter (Signed)
Spoke to pt. He is aware that we are awaiting a response from Dr. Isaiah Serge.   Dr. Isaiah Serge, please advise. Thanks

## 2023-05-05 NOTE — Telephone Encounter (Signed)
Please order 2 L oxygen during daytime and at night.  Documentation provided in cardiac rehab notes.  Thank you.

## 2023-05-06 ENCOUNTER — Encounter: Payer: Self-pay | Admitting: Physician Assistant

## 2023-05-06 ENCOUNTER — Encounter (HOSPITAL_COMMUNITY)
Admission: RE | Admit: 2023-05-06 | Discharge: 2023-05-06 | Disposition: A | Discharge status: Home / Self Care | Payer: Medicare Other | Source: Ambulatory Visit | Attending: Pulmonary Disease

## 2023-05-06 VITALS — Wt 196.9 lb

## 2023-05-06 DIAGNOSIS — J849 Interstitial pulmonary disease, unspecified: Secondary | ICD-10-CM

## 2023-05-06 NOTE — Addendum Note (Signed)
Addended by: Christen Butter on: 05/06/2023 10:02 AM   Modules accepted: Orders

## 2023-05-06 NOTE — Addendum Note (Signed)
Addended by: Christen Butter on: 05/06/2023 10:32 AM   Modules accepted: Orders

## 2023-05-06 NOTE — Progress Notes (Signed)
04/14/23 1152  6 Minute Walk  Phase Initial  Distance 1200 feet  Walk Time 6 minutes  # of Rest Breaks 1 (3:03-4:00)  MPH 2.27  METS 2.61  RPE 11  Perceived Dyspnea  1  VO2 Peak 9.14  Symptoms No  Resting HR 58 bpm  Resting BP 136/70  Resting Oxygen Saturation  92 %  Exercise Oxygen Saturation  during 6 min walk 85 %  Max Ex. HR 80 bpm  Max Ex. BP 142/68  2 Minute Post BP 134/68  Interval HR  1 Minute HR 71  2 Minute HR 77  3 Minute HR 77  4 Minute HR 75  5 Minute HR 79  6 Minute HR 80  2 Minute Post HR 59  Interval Heart Rate? Yes  Interval Oxygen  Interval Oxygen? Yes  Baseline Oxygen Saturation % 92 %  1 Minute Oxygen Saturation % 93 %  1 Minute Liters of Oxygen 0 L  2 Minute Oxygen Saturation % 88 %  2 Minute Liters of Oxygen 0 L  3 Minute Oxygen Saturation % 85 %  3 Minute Liters of Oxygen 0 L  4 Minute Oxygen Saturation % 92 %  4 Minute Liters of Oxygen 2 L  5 Minute Oxygen Saturation % 93 %  5 Minute Liters of Oxygen 2 L  6 Minute Oxygen Saturation % 89 %  6 Minute Liters of Oxygen 2 L  2 Minute Post Oxygen Saturation % 94 %  2 Minute Post Liters of Oxygen 2 L

## 2023-05-06 NOTE — Telephone Encounter (Signed)
Note updated in Epic and order for o2 was placed Nothing further needed

## 2023-05-06 NOTE — Progress Notes (Signed)
Daily Session Note  Patient Details  Name: Tyler Hendricks MRN: [id number] Date of Birth: 02/24/1951 Referring Provider:   Doristine Devoid Pulmonary Rehab Walk Test from 04/14/2023 in [facility]  Referring Provider Mannam       Encounter Date: 05/06/2023  Check In:  Session Check In - 05/06/23 1000       Check-In   Supervising physician immediately available to respond to emergencies [facility] MD immediately available    Physician(s) Rise Paganini, NP    Location [facility]    Staff Present Essie Hart, RN, Doris Cheadle, MS, ACSM-CEP, Exercise Physiologist;Leavy Heatherly Katrinka Blazing, RT    Virtual Visit No    Medication changes reported     No    Fall or balance concerns reported    No    Tobacco Cessation No Change    Warm-up and Cool-down Performed as group-led instruction    Resistance Training Performed Yes    VAD Patient? No    PAD/SET Patient? No      Pain Assessment   Currently in Pain? No/denies    Pain Score 0-No pain    Multiple Pain Sites No             Capillary Blood Glucose: No results found for this or any previous visit (from the past 24 hours).    Social History   Tobacco Use  Smoking Status Former   Current packs/day: 0.00   Average packs/day: 2.0 packs/day for 20.0 years (40.0 ttl pk-yrs)   Types: Cigarettes   Start date: 75   Quit date: 55   Years since quitting: 31.1   Passive exposure: Past  Smokeless Tobacco Never    Goals Met:  Proper associated with RPD/PD & O2 Sat Independence with exercise equipment Exercise tolerated well No report of concerns or symptoms today Strength training completed today  Goals Unmet:  Not Applicable  Comments: Service time is from 1005 to 1134.    Dr. Mechele Collin is Medical Director for Pulmonary Rehab at [facility].

## 2023-05-06 NOTE — Telephone Encounter (Signed)
I do not see any qualifying o2 sats  Msg to [facility] to get more info and then will order o2

## 2023-05-07 ENCOUNTER — Ambulatory Visit (HOSPITAL_COMMUNITY)
Admission: RE | Admit: 2023-05-07 | Discharge: 2023-05-07 | Disposition: A | Discharge status: Home / Self Care | Payer: Medicare Other | Source: Ambulatory Visit | Attending: Internal Medicine | Admitting: Internal Medicine

## 2023-05-07 DIAGNOSIS — I350 Nonrheumatic aortic (valve) stenosis: Secondary | ICD-10-CM | POA: Diagnosis not present

## 2023-05-07 DIAGNOSIS — R918 Other nonspecific abnormal finding of lung field: Secondary | ICD-10-CM | POA: Diagnosis not present

## 2023-05-07 DIAGNOSIS — Z48812 Encounter for surgical aftercare following surgery on the circulatory system: Secondary | ICD-10-CM | POA: Diagnosis not present

## 2023-05-07 DIAGNOSIS — Z01818 Encounter for other preprocedural examination: Secondary | ICD-10-CM | POA: Diagnosis not present

## 2023-05-07 DIAGNOSIS — K766 Portal hypertension: Secondary | ICD-10-CM | POA: Diagnosis not present

## 2023-05-07 MED ORDER — IOHEXOL 350 MG/ML SOLN
95.0000 mL | Freq: Once | INTRAVENOUS | Status: AC | PRN
  Administered 2023-05-07: 95 mL via INTRAVENOUS

## 2023-05-11 ENCOUNTER — Encounter (HOSPITAL_COMMUNITY)
Admission: RE | Admit: 2023-05-11 | Discharge: 2023-05-11 | Disposition: A | Discharge status: Home / Self Care | Payer: Medicare Other | Source: Ambulatory Visit | Attending: Pulmonary Disease | Admitting: Pulmonary Disease

## 2023-05-11 ENCOUNTER — Telehealth (HOSPITAL_COMMUNITY): Payer: Self-pay | Admitting: *Deleted

## 2023-05-11 DIAGNOSIS — J849 Interstitial pulmonary disease, unspecified: Secondary | ICD-10-CM

## 2023-05-11 NOTE — Telephone Encounter (Signed)
 Received voice mail message today will not be attending Pulmonary rehab today, probably back Thursday.

## 2023-05-12 ENCOUNTER — Telehealth (HOSPITAL_COMMUNITY): Payer: Self-pay

## 2023-05-12 NOTE — Telephone Encounter (Signed)
 Called pt to let him know that Pulmonary rehab was going to be in session on 05/13/23.

## 2023-05-12 NOTE — Progress Notes (Signed)
 Pulmonary Individual Treatment Plan  Patient Details  Name: Tyler Hendricks MRN: [id number] Date of Birth: Oct 17, 1950 Referring Provider:   Doristine Devoid Pulmonary Rehab Walk Test from 04/14/2023 in [facility]  Referring Provider Mannam       Initial Encounter Date:  Flowsheet Row Pulmonary Rehab Walk Test from 04/14/2023 in [facility]  Date 04/14/23       Visit Diagnosis: ILD (interstitial lung disease) (HCC)  Patient's Home Medications on Admission:   Current Outpatient Medications:    amLODipine (NORVASC) 2.5 MG tablet, TAKE 1 TABLET BY MOUTH EVERY DAY, Disp: 90 tablet, Rfl: 3   atenolol (TENORMIN) 50 MG tablet, TAKE 1 TABLET BY MOUTH EVERY DAY, Disp: 90 tablet, Rfl: 3   bictegravir-emtricitabine-tenofovir AF (BIKTARVY) 50-200-25 MG TABS tablet, Take 1 tablet by mouth daily., Disp: 30 tablet, Rfl: 11   cholecalciferol (VITAMIN D3) 25 MCG (1000 UNIT) tablet, Take 1,000 Units by mouth daily., Disp: , Rfl:    clopidogrel (PLAVIX) 75 MG tablet, TAKE 1 TABLET BY MOUTH EVERY DAY, Disp: 90 tablet, Rfl: 3   HYDROcodone bit-homatropine (HYCODAN) 5-1.5 MG/5ML syrup, Take 5 mLs by mouth every 6 (six) hours as needed (chronic cough - pulmonary fibrosis)., Disp: 473 mL, Rfl: 0   losartan-hydrochlorothiazide (HYZAAR) 100-25 MG tablet, Take 1 tablet by mouth daily., Disp: 90 tablet, Rfl: 3   Multiple Vitamin (MULTIVITAMIN) tablet, Take 1 tablet by mouth daily., Disp: , Rfl:    OFEV 100 MG CAPS, TAKE 1 CAPSULE BY MOUTH 2 TIMES A DAY WITH FOOD. TAKE 12 HOURS APART., Disp: 180 capsule, Rfl: 1   Oxycodone HCl 10 MG TABS, Take 10 mg by mouth as needed (Pain)., Disp: , Rfl:    rosuvastatin (CRESTOR) 10 MG tablet, Take 1 tablet (10 mg total) by mouth daily., Disp: 90 tablet, Rfl: 3   SYNJARDY XR 12.07-998 MG TB24, Take 2 tablets by mouth daily. (Patient taking differently: Take 1 tablet  by mouth 2 (two) times daily.), Disp: 180 tablet, Rfl: 3   VASCEPA 1 g capsule, Take 2 capsules (2 g total) by mouth 2 (two) times daily., Disp: 360 capsule, Rfl: 3  Past Medical History: Past Medical History:  Diagnosis Date   Atherosclerotic heart disease of native coronary artery without angina pectoris    CAD (coronary artery disease)    Coronary angioplasty status    Diabetes mellitus without complication (HCC)    Essential (primary) hypertension    Gallstones    Hepatic cirrhosis (HCC)    Hepatitis B core antibody positive 01/08/2019   Hepatitis C    HIV infection (HCC)    Hyperlipidemia    Hypertension    Internal hemorrhoids    Kidney stones    Mixed hyperlipidemia    Severe aortic stenosis    Sleep apnea    CPAP   Tubular adenoma of colon     Tobacco Use: Social History   Tobacco Use  Smoking Status Former   Current packs/day: 0.00   Average packs/day: 2.0 packs/day for 20.0 years (40.0 ttl pk-yrs)   Types: Cigarettes   Start date: 73   Quit date: 42   Years since quitting: 31.1   Passive exposure: Past  Smokeless Tobacco Never    Labs: Review Flowsheet  More data exists      Latest Ref Rng & Units 10/08/2021 05/06/2022 11/03/2022 12/10/2022 04/23/2023  Labs for ITP Cardiac and Pulmonary Rehab  Cholestrol 0 - 200 mg/dL 161  - 096  - -  LDL (calc) 0 - 99 mg/dL 47  - 58  - -  HDL-C >04.54 mg/dL 09.81  - 19.14  - -  Trlycerides 0.0 - 149.0 mg/dL 78.2  - 95.6  - -  Hemoglobin A1c 4.6 - 6.5 % 6.7  6.5  7.0  6.8  -  PH, Arterial 7.35 - 7.45 - - - - 7.391   PCO2 arterial 32 - 48 mmHg - - - - 36.4   Bicarbonate 20.0 - 28.0 mmol/L - - - - 25.8  24.3  25.4  22.1  25.6   TCO2 22 - 32 mmol/L - - - - 27  26  27  23  27    Acid-base deficit 0.0 - 2.0 mmol/L - - - - 1.0  2.0   O2 Saturation % - - - - 80  82  80  97  81     Details       Multiple values from one day are sorted in reverse-chronological order         Capillary Blood Glucose: Lab Results   Component Value Date   GLUCAP 89 04/23/2023   GLUCAP 133 (H) 04/23/2023   GLUCAP 151 (H) 04/22/2023   GLUCAP 202 (H) 04/22/2023   GLUCAP 152 (H) 04/20/2023    POCT Glucose     Row Name 04/14/23 1142             POCT Blood Glucose   Pre-Exercise 207 mg/dL                Pulmonary Assessment Scores:  Pulmonary Assessment Scores     Row Name 04/14/23 1146         ADL UCSD   ADL Phase Entry     SOB Score total 21       CAT Score   CAT Score 10       mMRC Score   mMRC Score 2             UCSD: Self-administered rating of dyspnea associated with activities of daily living (ADLs) 6-point scale (0 = "not at all" to 5 = "maximal or unable to do because of breathlessness")  Scoring Scores range from 0 to 120.  Minimally important difference is 5 units  CAT: CAT can identify the health impairment of COPD patients and is better correlated with disease progression.  CAT has a scoring range of zero to 40. The CAT score is classified into four groups of low (less than 10), medium (10 - 20), high (21-30) and very high (31-40) based on the impact level of disease on health status. A CAT score over 10 suggests significant symptoms.  A worsening CAT score could be explained by an exacerbation, poor medication adherence, poor inhaler technique, or progression of COPD or comorbid conditions.  CAT MCID is 2 points  mMRC: mMRC (Modified Medical Research Council) Dyspnea Scale is used to assess the degree of baseline functional disability in patients of respiratory disease due to dyspnea. No minimal important difference is established. A decrease in score of 1 point or greater is considered a positive change.   Pulmonary Function Assessment:  Pulmonary Function Assessment - 04/14/23 1055       Breath   Bilateral Breath Sounds Basilar;Rales    Shortness of Breath Yes;Limiting activity             Exercise Target Goals: Exercise Program Goal: Individual exercise  prescription set using results from initial 6 min walk test and THRR while considering  patient's activity barriers and safety.   Exercise Prescription Goal: Initial exercise prescription builds to 30-45 minutes a day of aerobic activity, 2-3 days per week.  Home exercise guidelines will be given to patient during program as part of exercise prescription that the participant will acknowledge.  Activity Barriers & Risk Stratification:  Activity Barriers & Cardiac Risk Stratification - 04/14/23 1053       Activity Barriers & Cardiac Risk Stratification   Activity Barriers Arthritis;Back Problems;Deconditioning;Muscular Weakness;Shortness of Breath    Cardiac Risk Stratification Moderate             6 Minute Walk:  6 Minute Walk     Row Name 04/14/23 1152         6 Minute Walk   Phase Initial     Distance 1200 feet     Walk Time 6 minutes     # of Rest Breaks 1  3:03-4:00     MPH 2.27     METS 2.61     RPE 11     Perceived Dyspnea  1     VO2 Peak 9.14     Symptoms No     Resting HR 58 bpm     Resting BP 136/70     Resting Oxygen Saturation  92 %     Exercise Oxygen Saturation  during 6 min walk 85 %     Max Ex. HR 80 bpm     Max Ex. BP 142/68     2 Minute Post BP 134/68       Interval HR   1 Minute HR 71     2 Minute HR 77     3 Minute HR 77     4 Minute HR 75     5 Minute HR 79     6 Minute HR 80     2 Minute Post HR 59     Interval Heart Rate? Yes       Interval Oxygen   Interval Oxygen? Yes     Baseline Oxygen Saturation % 92 %     1 Minute Oxygen Saturation % 93 %     1 Minute Liters of Oxygen 0 L     2 Minute Oxygen Saturation % 88 %     2 Minute Liters of Oxygen 0 L     3 Minute Oxygen Saturation % 85 %     3 Minute Liters of Oxygen 0 L     4 Minute Oxygen Saturation % 92 %     4 Minute Liters of Oxygen 2 L     5 Minute Oxygen Saturation % 93 %     5 Minute Liters of Oxygen 2 L     6 Minute Oxygen Saturation % 89 %     6 Minute Liters of Oxygen  2 L     2 Minute Post Oxygen Saturation % 94 %     2 Minute Post Liters of Oxygen 2 L              Oxygen Initial Assessment:  Oxygen Initial Assessment - 05/07/23 1336       Home Oxygen   Home Oxygen Device Portable Concentrator    Sleep Oxygen Prescription CPAP   noncompiant   Home Exercise Oxygen Prescription None    Home Resting Oxygen Prescription None      Initial 6 min  Walk   Oxygen Used Continuous    Liters per minute 2      Program Oxygen Prescription   Program Oxygen Prescription Continuous    Liters per minute 2      Intervention   Short Term Goals To learn and understand importance of monitoring SPO2 with pulse oximeter and demonstrate accurate use of the pulse oximeter.;To learn and understand importance of maintaining oxygen saturations>88%;To learn and demonstrate proper pursed lip breathing techniques or other breathing techniques. ;To learn and demonstrate proper use of respiratory medications;To learn and exhibit compliance with exercise, home and travel O2 prescription    Long  Term Goals Maintenance of O2 saturations>88%;Compliance with respiratory medication;Verbalizes importance of monitoring SPO2 with pulse oximeter and return demonstration;Exhibits proper breathing techniques, such as pursed lip breathing or other method taught during program session;Demonstrates proper use of MDI's;Exhibits compliance with exercise, home  and travel O2 prescription             Oxygen Re-Evaluation:  Oxygen Re-Evaluation     Row Name 05/07/23 1336             Goals/Expected Outcomes   Goals/Expected Outcomes Compliance and understanding of oxygen saturation and breathing techniques to decrease shortness of breath.                Oxygen Discharge (Final Oxygen Re-Evaluation):  Oxygen Re-Evaluation - 05/07/23 1336       Goals/Expected Outcomes   Goals/Expected Outcomes Compliance and understanding of oxygen saturation and breathing techniques to  decrease shortness of breath.             Initial Exercise Prescription:  Initial Exercise Prescription - 04/14/23 1100       Date of Initial Exercise RX and Referring Provider   Date 04/14/23    Referring Provider Mannam    Expected Discharge Date 07/08/23      Oxygen   Oxygen Continuous    Liters 2    Maintain Oxygen Saturation 88% or higher      Treadmill   MPH 2    Grade 0    Minutes 15    METs 2.54      Bike   Level 2    Watts 40    Minutes 15    METs 2      Prescription Details   Frequency (times per week) 2    Duration Progress to 30 minutes of continuous aerobic without signs/symptoms of physical distress      Intensity   THRR 40-80% of Max Heartrate 59-118    Ratings of Perceived Exertion 11-13    Perceived Dyspnea 0-4      Progression   Progression Continue to progress workloads to maintain intensity without signs/symptoms of physical distress.      Resistance Training   Training Prescription Yes    Weight blue bands    Reps 10-15             Perform Capillary Blood Glucose checks as needed.  Exercise Prescription Changes:   Exercise Prescription Changes     Row Name 04/22/23 1202 05/11/23 1100           Response to Exercise   Blood Pressure (Admit) 142/70 130/72      Blood Pressure (Exercise) 142/80 --      Blood Pressure (Exit) 114/60 108/66      Heart Rate (Admit) 63 bpm 79 bpm      Heart Rate (Exercise) 82 bpm 109 bpm  Heart Rate (Exit) 64 bpm 79 bpm      Oxygen Saturation (Admit) 94 % 95 %      Oxygen Saturation (Exercise) 93 % 92 %      Oxygen Saturation (Exit) 96 % 93 %      Rating of Perceived Exertion (Exercise) 9 11      Perceived Dyspnea (Exercise) 1 1      Duration Continue with 30 min of aerobic exercise without signs/symptoms of physical distress. Continue with 30 min of aerobic exercise without signs/symptoms of physical distress.      Intensity THRR unchanged THRR unchanged        Progression    Progression Continue to progress workloads to maintain intensity without signs/symptoms of physical distress. Continue to progress workloads to maintain intensity without signs/symptoms of physical distress.        Resistance Training   Training Prescription Yes Yes      Weight blue bands blue bands      Reps 10-15 10-15      Time 10 Minutes 10 Minutes        Oxygen   Oxygen Continuous Continuous      Liters 2-4 2-4        Treadmill   MPH 2 2.1      Grade 0 0      Minutes 15 15      METs 2.4 2.5        Bike   Level 4 4      Minutes 15 15      METs 3.3 2.9        Oxygen   Maintain Oxygen Saturation 88% or higher --               Exercise Comments:   Exercise Comments     Row Name 04/20/23 1509           Exercise Comments Elman completed his first day of exercise. He exercised for 15 min on the upright bike and treadmill. He averaged 2.6 METs at level 2 on the upright bike and 2.2 METs at 1.7 mph on the treadmill. Talmage performed the warmup and cooldown standing without limitations. Plan to discuss METs later on as Lily felt overwhelmed today.                Exercise Goals and Review:   Exercise Goals     Row Name 04/14/23 1054             Exercise Goals   Increase Physical Activity Yes       Intervention Provide advice, education, support and counseling about physical activity/exercise needs.;Develop an individualized exercise prescription for aerobic and resistive training based on initial evaluation findings, risk stratification, comorbidities and participant's personal goals.       Expected Outcomes Short Term: Attend rehab on a regular basis to increase amount of physical activity.;Long Term: Add in home exercise to make exercise part of routine and to increase amount of physical activity.;Long Term: Exercising regularly at least 3-5 days a week.       Increase Strength and Stamina Yes       Intervention Provide advice, education, support and  counseling about physical activity/exercise needs.;Develop an individualized exercise prescription for aerobic and resistive training based on initial evaluation findings, risk stratification, comorbidities and participant's personal goals.       Expected Outcomes Short Term: Increase workloads from initial exercise prescription for resistance, speed, and METs.;Short Term: Perform resistance training exercises  routinely during rehab and add in resistance training at home;Long Term: Improve cardiorespiratory fitness, muscular endurance and strength as measured by increased METs and functional capacity ( )       Able to understand and use rate of perceived exertion (RPE) scale Yes       Intervention Provide education and explanation on how to use RPE scale       Expected Outcomes Short Term: Able to use RPE daily in rehab to express subjective intensity level;Long Term:  Able to use RPE to guide intensity level when exercising independently       Able to understand and use Dyspnea scale Yes       Intervention Provide education and explanation on how to use Dyspnea scale       Expected Outcomes Short Term: Able to use Dyspnea scale daily in rehab to express subjective sense of shortness of breath during exertion;Long Term: Able to use Dyspnea scale to guide intensity level when exercising independently       Knowledge and understanding of Target Heart Rate Range (THRR) Yes       Intervention Provide education and explanation of THRR including how the numbers were predicted and where they are located for reference       Expected Outcomes Short Term: Able to state/look up THRR;Long Term: Able to use THRR to govern intensity when exercising independently;Short Term: Able to use daily as guideline for intensity in rehab       Understanding of Exercise Prescription Yes       Intervention Provide education, explanation, and written materials on patient's individual exercise prescription       Expected  Outcomes Short Term: Able to explain program exercise prescription;Long Term: Able to explain home exercise prescription to exercise independently                Exercise Goals Re-Evaluation :  Exercise Goals Re-Evaluation     Row Name 05/07/23 1328             Exercise Goal Re-Evaluation   Exercise Goals Review Increase Physical Activity;Able to understand and use Dyspnea scale;Understanding of Exercise Prescription;Increase Strength and Stamina;Knowledge and understanding of Target Heart Rate Range (THRR);Able to understand and use rate of perceived exertion (RPE) scale       Comments Keshun has completed 5 exercise sessions. He exercises for 15 min on the treadmill and upright bike. Alton averages 2.5 METs at 2.1 mph on the treadmill and 2.9 METs at level 4 on the upright bike. Sohil performs the warmup and cooldown standing without limitations. Kealan has increased his workload for both exercise modes as METs have increased. He tolerates progressions well and seems motivated to exercise. I discussed METs on his first day, although I am unsure if he understands METs yet. Will reiterate METs again. Will continue to monitor and progress as able.       Expected Outcomes Through exercise at rehab and home, the patient will decrease shortness of breath and feel confident in carrying out an exercise regimen at home.                Discharge Exercise Prescription (Final Exercise Prescription Changes):  Exercise Prescription Changes - 05/11/23 1100       Response to Exercise   Blood Pressure (Admit) 130/72    Blood Pressure (Exit) 108/66    Heart Rate (Admit) 79 bpm    Heart Rate (Exercise) 109 bpm    Heart Rate (Exit) 79 bpm  Oxygen Saturation (Admit) 95 %    Oxygen Saturation (Exercise) 92 %    Oxygen Saturation (Exit) 93 %    Rating of Perceived Exertion (Exercise) 11    Perceived Dyspnea (Exercise) 1    Duration Continue with 30 min of aerobic exercise without  signs/symptoms of physical distress.    Intensity THRR unchanged      Progression   Progression Continue to progress workloads to maintain intensity without signs/symptoms of physical distress.      Resistance Training   Training Prescription Yes    Weight blue bands    Reps 10-15    Time 10 Minutes      Oxygen   Oxygen Continuous    Liters 2-4      Treadmill   MPH 2.1    Grade 0    Minutes 15    METs 2.5      Bike   Level 4    Minutes 15    METs 2.9             Nutrition:  Target Goals: Understanding of nutrition guidelines, daily intake of sodium 1500mg , cholesterol 200mg , calories 30% from fat and 7% or less from saturated fats, daily to have 5 or more servings of fruits and vegetables.  Biometrics:  Pre Biometrics - 04/14/23 1145       Pre Biometrics   Grip Strength 32 kg              Nutrition Therapy Plan and Nutrition Goals:   Nutrition Assessments:  MEDIFICTS Score Key: >=70 Need to make dietary changes  40-70 Heart Healthy Diet <= 40 Therapeutic Level Cholesterol Diet   Picture Your Plate Scores: <16 Unhealthy dietary pattern with much room for improvement. 41-50 Dietary pattern unlikely to meet recommendations for good health and room for improvement. 51-60 More healthful dietary pattern, with some room for improvement.  >60 Healthy dietary pattern, although there may be some specific behaviors that could be improved.    Nutrition Goals Re-Evaluation:   Nutrition Goals Discharge (Final Nutrition Goals Re-Evaluation):   Psychosocial: Target Goals: Acknowledge presence or absence of significant depression and/or stress, maximize coping skills, provide positive support system. Participant is able to verbalize types and ability to use techniques and skills needed for reducing stress and depression.  Initial Review & Psychosocial Screening:  Initial Psych Review & Screening - 04/14/23 1051       Initial Review   Current issues  with None Identified      Family Dynamics   Good Support System? Yes      Barriers   Psychosocial barriers to participate in program There are no identifiable barriers or psychosocial needs.      Screening Interventions   Interventions Encouraged to exercise             Quality of Life Scores:  Scores of 19 and below usually indicate a poorer quality of life in these areas.  A difference of  2-3 points is a clinically meaningful difference.  A difference of 2-3 points in the total score of the Quality of Life Index has been associated with significant improvement in overall quality of life, self-image, physical symptoms, and general health in studies assessing change in quality of life.  PHQ-9: Review Flowsheet  More data exists      04/14/2023 01/01/2023 07/10/2022 05/06/2022 01/02/2022  Depression screen PHQ 2/9  Decreased Interest 0 0 0 0 0  Down, Depressed, Hopeless 0 0 0 0 0  PHQ - 2 Score 0 0 0 0 0  Altered sleeping 0 - - - -  Tired, decreased energy 0 - - - -  Change in appetite 0 - - - -  Feeling bad or failure about yourself  0 - - - -  Trouble concentrating 0 - - - -  Moving slowly or fidgety/restless 0 - - - -  Suicidal thoughts 0 - - - -  PHQ-9 Score 0 - - - -  Difficult doing work/chores Not difficult at all - - - -   Interpretation of Total Score  Total Score Depression Severity:  1-4 = Minimal depression, 5-9 = Mild depression, 10-14 = Moderate depression, 15-19 = Moderately severe depression, 20-27 = Severe depression   Psychosocial Evaluation and Intervention:  Psychosocial Evaluation - 04/14/23 1143       Psychosocial Evaluation & Interventions   Interventions Encouraged to exercise with the program and follow exercise prescription    Comments Fabien denies any psychosocial barriers or concerns at this time.    Expected Outcomes For Layden to participate in PR free of any psychosocial barriers or concerns    Continue Psychosocial Services  No Follow  up required             Psychosocial Re-Evaluation:  Psychosocial Re-Evaluation     Row Name 05/05/23 1131             Psychosocial Re-Evaluation   Current issues with None Identified       Comments Zariah has attended 4 sessions so far. He denies any new psychosocial barriers or concerns at this time.       Expected Outcomes For Ferrell to participate in PR free of any psychosocial barriers or concnerns.       Interventions Encouraged to attend Pulmonary Rehabilitation for the exercise       Continue Psychosocial Services  No Follow up required                Psychosocial Discharge (Final Psychosocial Re-Evaluation):  Psychosocial Re-Evaluation - 05/05/23 1131       Psychosocial Re-Evaluation   Current issues with None Identified    Comments Jerick has attended 4 sessions so far. He denies any new psychosocial barriers or concerns at this time.    Expected Outcomes For Takari to participate in PR free of any psychosocial barriers or concnerns.    Interventions Encouraged to attend Pulmonary Rehabilitation for the exercise    Continue Psychosocial Services  No Follow up required             Education: Education Goals: Education classes will be provided on a weekly basis, covering required topics. Participant will state understanding/return demonstration of topics presented.  Learning Barriers/Preferences:  Learning Barriers/Preferences - 04/14/23 1052       Learning Barriers/Preferences   Learning Barriers Hearing    Learning Preferences Written Material;Individual Instruction;Group Instruction             Education Topics: Know Your Numbers Group instruction that is supported by a PowerPoint presentation. Instructor discusses importance of knowing and understanding resting, exercise, and post-exercise oxygen saturation, heart rate, and blood pressure. Oxygen saturation, heart rate, blood pressure, rating of perceived exertion, and dyspnea are reviewed  along with a normal range for these values.    Exercise for the Pulmonary Patient Group instruction that is supported by a PowerPoint presentation. Instructor discusses benefits of exercise, core components of exercise, frequency, duration, and intensity of an exercise routine, importance of  utilizing pulse oximetry during exercise, safety while exercising, and options of places to exercise outside of rehab.    MET Level  Group instruction provided by PowerPoint, verbal discussion, and written material to support subject matter. Instructor reviews what METs are and how to increase METs.    Pulmonary Medications Verbally interactive group education provided by instructor with focus on inhaled medications and proper administration.   Anatomy and Physiology of the Respiratory System Group instruction provided by PowerPoint, verbal discussion, and written material to support subject matter. Instructor reviews respiratory cycle and anatomical components of the respiratory system and their functions. Instructor also reviews differences in obstructive and restrictive respiratory diseases with examples of each.    Oxygen Safety Group instruction provided by PowerPoint, verbal discussion, and written material to support subject matter. There is an overview of "What is Oxygen" and "Why do we need it".  Instructor also reviews how to create a safe environment for oxygen use, the importance of using oxygen as prescribed, and the risks of noncompliance. There is a brief discussion on traveling with oxygen and resources the patient may utilize.   Oxygen Use Group instruction provided by PowerPoint, verbal discussion, and written material to discuss how supplemental oxygen is prescribed and different types of oxygen supply systems. Resources for more information are provided.    Breathing Techniques Group instruction that is supported by demonstration and informational handouts. Instructor discusses the  benefits of pursed lip and diaphragmatic breathing and detailed demonstration on how to perform both.     Risk Factor Reduction Group instruction that is supported by a PowerPoint presentation. Instructor discusses the definition of a risk factor, different risk factors for pulmonary disease, and how the heart and lungs work together. Flowsheet Row PULMONARY REHAB OTHER RESPIRATORY from 05/06/2023 in [facility]  Date 05/06/23  Educator EP  Instruction Review Code 1- Verbalizes Understanding       Pulmonary Diseases Group instruction provided by PowerPoint, verbal discussion, and written material to support subject matter. Instructor gives an overview of the different type of pulmonary diseases. There is also a discussion on risk factors and symptoms as well as ways to manage the diseases.   Stress and Energy Conservation Group instruction provided by PowerPoint, verbal discussion, and written material to support subject matter. Instructor gives an overview of stress and the impact it can have on the body. Instructor also reviews ways to reduce stress. There is also a discussion on energy conservation and ways to conserve energy throughout the day. Flowsheet Row PULMONARY REHAB OTHER RESPIRATORY from 04/22/2023 in [facility]  Date 04/22/23  Educator RN  Instruction Review Code 1- Verbalizes Understanding       Warning Signs and Symptoms Group instruction provided by PowerPoint, verbal discussion, and written material to support subject matter. Instructor reviews warning signs and symptoms of stroke, heart attack, cold and flu. Instructor also reviews ways to prevent the spread of infection. Flowsheet Row PULMONARY REHAB OTHER RESPIRATORY from 04/29/2023 in [facility], [facility]  Date 04/29/23  Educator RN  Instruction  Review Code 1- Verbalizes Understanding       Other Education Group or individual verbal, written, or video instructions that support the educational goals of the pulmonary rehab program.    Knowledge Questionnaire Score:  Knowledge Questionnaire Score - 04/14/23 1149       Knowledge Questionnaire  Score   Pre Score 11/18             Core Components/Risk Factors/Patient Goals at Admission:  Personal Goals and Risk Factors at Admission - 04/14/23 1052       Core Components/Risk Factors/Patient Goals on Admission   Improve shortness of breath with ADL's Yes    Intervention Provide education, individualized exercise plan and daily activity instruction to help decrease symptoms of SOB with activities of daily living.    Expected Outcomes Short Term: Improve cardiorespiratory fitness to achieve a reduction of symptoms when performing ADLs;Long Term: Be able to perform more ADLs without symptoms or delay the onset of symptoms             Core Components/Risk Factors/Patient Goals Review:   Goals and Risk Factor Review     Row Name 05/05/23 1133             Core Components/Risk Factors/Patient Goals Review   Personal Goals Review Improve shortness of breath with ADL's;Develop more efficient breathing techniques such as purse lipped breathing and diaphragmatic breathing and practicing self-pacing with activity.       Review Paulanthony has attended 4 sessions so far. Goal progressing for improving shortness of breath with ADL's. Goal progressing for developing more efficient breathing techniques such as purse lipped breathing and diaphragmatic breathing; and practicing self-pacing with activity.  Durelle is currently requiring 2-4 L while exercising to keep sats >88%. He is currently exercising on the Treadmill and the bike. We will continue to monitor her progress throughout the program.       Expected Outcomes To improve shortness of breath with ADL's and develop more efficient  breathing techniques such as purse lipped breathing and diaphragmatic breathing; and practicing self-pacing with activity.                Core Components/Risk Factors/Patient Goals at Discharge (Final Review):   Goals and Risk Factor Review - 05/05/23 1133       Core Components/Risk Factors/Patient Goals Review   Personal Goals Review Improve shortness of breath with ADL's;Develop more efficient breathing techniques such as purse lipped breathing and diaphragmatic breathing and practicing self-pacing with activity.    Review Croix has attended 4 sessions so far. Goal progressing for improving shortness of breath with ADL's. Goal progressing for developing more efficient breathing techniques such as purse lipped breathing and diaphragmatic breathing; and practicing self-pacing with activity.  Keante is currently requiring 2-4 L while exercising to keep sats >88%. He is currently exercising on the Treadmill and the bike. We will continue to monitor her progress throughout the program.    Expected Outcomes To improve shortness of breath with ADL's and develop more efficient breathing techniques such as purse lipped breathing and diaphragmatic breathing; and practicing self-pacing with activity.             ITP Comments: Pt is making expected progress toward Pulmonary Rehab goals after completing 5 session(s). Recommend continued exercise, life style modification, education, and utilization of breathing techniques to increase stamina and strength, while also decreasing shortness of breath with exertion.  Dr. Mechele Collin is Medical Director for Pulmonary Rehab at [facility].

## 2023-05-13 ENCOUNTER — Encounter (HOSPITAL_COMMUNITY): Payer: Medicare Other

## 2023-05-18 ENCOUNTER — Encounter (HOSPITAL_COMMUNITY)
Admission: RE | Admit: 2023-05-18 | Discharge: 2023-05-18 | Disposition: A | Discharge status: Home / Self Care | Payer: Medicare Other | Source: Ambulatory Visit | Attending: Pulmonary Disease | Admitting: Pulmonary Disease

## 2023-05-18 DIAGNOSIS — J849 Interstitial pulmonary disease, unspecified: Secondary | ICD-10-CM

## 2023-05-18 NOTE — Progress Notes (Signed)
 Daily Session Note  Patient Details  Name: Tyler Hendricks MRN: [id number] Date of Birth: May 01, 1950 Referring Provider:   Doristine Devoid Pulmonary Rehab Walk Test from 04/14/2023 in [facility]  Referring Provider Mannam       Encounter Date: 05/18/2023  Check In:  Session Check In - 05/18/23 2952       Check-In   Supervising physician immediately available to respond to emergencies [facility] MD immediately available    Physician(s) Robin Searing, NP    Location [facility]    Staff Present Essie Hart, RN, Doris Cheadle, MS, ACSM-CEP, Exercise Physiologist;Naomie Crow Katrinka Blazing, RT    Virtual Visit No    Medication changes reported     No    Fall or balance concerns reported    No    Tobacco Cessation No Change    Warm-up and Cool-down Performed as group-led instruction    Resistance Training Performed Yes    VAD Patient? No    PAD/SET Patient? No      Pain Assessment   Currently in Pain? No/denies    Pain Score 0-No pain    Multiple Pain Sites No             Capillary Blood Glucose: No results found for this or any previous visit (from the past 24 hours).    Social History   Tobacco Use  Smoking Status Former   Current packs/day: 0.00   Average packs/day: 2.0 packs/day for 20.0 years (40.0 ttl pk-yrs)   Types: Cigarettes   Start date: 41   Quit date: 38   Years since quitting: 31.1   Passive exposure: Past  Smokeless Tobacco Never    Goals Met:  Proper associated with RPD/PD & O2 Sat Independence with exercise equipment Exercise tolerated well No report of concerns or symptoms today Strength training completed today  Goals Unmet:  Not Applicable  Comments: Service time is from 1013 to 1143.    Dr. Mechele Collin is Medical Director for Pulmonary Rehab at [facility].

## 2023-05-19 ENCOUNTER — Telehealth: Payer: Self-pay | Admitting: Pulmonary Disease

## 2023-05-19 DIAGNOSIS — R053 Chronic cough: Secondary | ICD-10-CM

## 2023-05-19 NOTE — Progress Notes (Unsigned)
 [address]       [place] [address]             [phone]           Domanik Rainville [facility] Medical Record #[id number] Date of Birth: 25-Jul-1950  Elder Negus, MD Myrlene Broker, MD  Chief Complaint: Increasing DOE    History of Present Illness:     Pt is a very pleasant 73 yo male who has been found to have now increasing DOE. He has been followed for moderate AS since 2018. He has a cardiac history of stenting of his coronaries when he was in [place]. A repeat TTE has found him to now have normal LV function with an ef of 55% and severe AS with as mean gradient of but with an AVA of 0.76cm2. His DVI is .22  He was evaluated by Dr Clifton James and felt to need AVR. He underwent cath with an instent stenosis of his diagonal but overall felt not to require intervention and TAVR CTA with acceptable femoral access and valve sized to a 26mm Sapien valve. He has been HIV positive since 1989 and has undetectable viral load but also suffers from pulmonary fibrosis which his Pulmonologist suggests going on evening home O2 ( has not been delivered yet). He also has cirrhosis and with some esophageal varicies      Past Medical History:  Diagnosis Date   Atherosclerotic heart disease of native coronary artery without angina pectoris    CAD (coronary artery disease)    Coronary angioplasty status    Diabetes mellitus without complication (HCC)    Essential (primary) hypertension    Gallstones    Hepatic cirrhosis (HCC)    Hepatitis B core antibody positive 01/08/2019   Hepatitis C    HIV infection (HCC)    Hyperlipidemia    Hypertension    Internal hemorrhoids    Kidney stones    Mixed hyperlipidemia    Severe aortic stenosis    Sleep apnea    CPAP   Tubular adenoma of colon     Past Surgical History:  Procedure Laterality Date   CARDIAC CATHETERIZATION Left 04/2013   chlecystectomy     CHOLECYSTECTOMY     COLONOSCOPY  05/02/2013    GAS/FLUID EXCHANGE Right 12/30/2021   Procedure: GAS/FLUID EXCHANGE;  Surgeon: Carmela Rima, MD;  Location: [facility] OR;  Service: Ophthalmology;  Laterality: Right;  SF6   PARS PLANA VITRECTOMY Right 12/30/2021   Procedure: PARS PLANA VITRECTOMY WITH 25 GAUGE;  Surgeon: Carmela Rima, MD;  Location: [facility] OR;  Service: Ophthalmology;  Laterality: Right;   PERCUTANEOUS CORONARY STENT INTERVENTION (PCI-S)     PHOTOCOAGULATION WITH LASER Right 12/30/2021   Procedure: PHOTOCOAGULATION WITH LASER;  Surgeon: Carmela Rima, MD;  Location: [facility] OR;  Service: Ophthalmology;  Laterality: Right;   POLYPECTOMY     RIGHT/LEFT HEART CATH AND CORONARY ANGIOGRAPHY N/A 04/23/2023   Procedure: RIGHT/LEFT HEART CATH AND CORONARY ANGIOGRAPHY;  Surgeon: Elder Negus, MD;  Location: [facility] INV[facility];  Service: Cardiovascular;  Laterality: N/A;   UMBILICAL HERNIA REPAIR      Social History   Tobacco Use  Smoking Status Former   Current packs/day: 0.00   Average packs/day: 2.0 packs/day for 20.0 years (40.0 ttl pk-yrs)   Types: Cigarettes   Start date: 33   Quit date: 27   Years since quitting: 31.1   Passive exposure: Past  Smokeless Tobacco Never  Social History   Substance and Sexual Activity  Alcohol Use Not Currently   Comment: social-occ beer    Social History   Socioeconomic History   Marital status: Married    Spouse name: Not on file   Number of children: 1   Years of education: Not on file   Highest education level: 9th grade  Occupational History   Occupation: retired   Occupation: Retired Psychologist, forensic  Tobacco Use   Smoking status: Former    Current packs/day: 0.00    Average packs/day: 2.0 packs/day for 20.0 years (40.0 ttl pk-yrs)    Types: Cigarettes    Start date: 41    Quit date: 1994    Years since quitting: 31.1    Passive exposure: Past   Smokeless tobacco: Never  Vaping Use   Vaping status: Never Used  Substance and Sexual Activity    Alcohol use: Not Currently    Comment: social-occ beer   Drug use: No   Sexual activity: Yes  Other Topics Concern   Not on file  Social History Narrative   Not on file   Social Drivers of Health   Financial Resource Strain: Low Risk  (11/02/2022)   Overall Financial Resource Strain (CARDIA)    Difficulty of Paying Living Expenses: Not hard at all  Food Insecurity: No Food Insecurity (11/02/2022)   Hunger Vital Sign    Worried About Running Out of Food in the Last Year: Never true    Ran Out of Food in the Last Year: Never true  Transportation Needs: No Transportation Needs (11/02/2022)   PRAPARE - Administrator, Civil Service (Medical): No    Lack of Transportation (Non-Medical): No  Physical Activity: Insufficiently Active (11/02/2022)   Exercise Vital Sign    Days of Exercise per Week: 2 days    Minutes of Exercise per Session: 30 min  Stress: Stress Concern Present (11/02/2022)   [employer] of Occupational Health - Occupational Stress Questionnaire    Feeling of Stress : To some extent  Social Connections: Moderately Isolated (11/02/2022)   Social Connection and Isolation Panel [NHANES]    Frequency of Communication with Friends and Family: More than three times a week    Frequency of Social Gatherings with Friends and Family: Once a week    Attends Religious Services: Never    Database administrator or Organizations: No    Attends Engineer, structural: Not on file    Marital Status: Married  Catering manager Violence: Not At Risk (12/22/2021)   Humiliation, Afraid, Rape, and Kick questionnaire    Fear of Current or Ex-Partner: No    Emotionally Abused: No    Physically Abused: No    Sexually Abused: No    Allergies  Allergen Reactions   Integrilin [Eptifibatide] Other (See Comments)    Bleeding (non-specific)   Lopressor [Metoprolol Tartrate] Rash and Other (See Comments)    Bleeding (non-specific)    Penicillins Diarrhea and Rash     Current Outpatient Medications  Medication Sig Dispense Refill   amLODipine (NORVASC) 2.5 MG tablet TAKE 1 TABLET BY MOUTH EVERY DAY 90 tablet 3   atenolol (TENORMIN) 50 MG tablet TAKE 1 TABLET BY MOUTH EVERY DAY 90 tablet 3   bictegravir-emtricitabine-tenofovir AF (BIKTARVY) 50-200-25 MG TABS tablet Take 1 tablet by mouth daily. 30 tablet 11   cholecalciferol (VITAMIN D3) 25 MCG (1000 UNIT) tablet Take 1,000 Units by mouth daily.     clopidogrel (PLAVIX)  75 MG tablet TAKE 1 TABLET BY MOUTH EVERY DAY 90 tablet 3   HYDROcodone bit-homatropine (HYCODAN) 5-1.5 MG/5ML syrup Take 5 mLs by mouth every 6 (six) hours as needed (chronic cough - pulmonary fibrosis). 473 mL 0   losartan-hydrochlorothiazide (HYZAAR) 100-25 MG tablet Take 1 tablet by mouth daily. 90 tablet 3   Multiple Vitamin (MULTIVITAMIN) tablet Take 1 tablet by mouth daily.     OFEV 100 MG CAPS TAKE 1 CAPSULE BY MOUTH 2 TIMES A DAY WITH FOOD. TAKE 12 HOURS APART. 180 capsule 1   Oxycodone HCl 10 MG TABS Take 10 mg by mouth as needed (Pain).     rosuvastatin (CRESTOR) 10 MG tablet Take 1 tablet (10 mg total) by mouth daily. 90 tablet 3   SYNJARDY XR 12.07-998 MG TB24 Take 2 tablets by mouth daily. (Patient taking differently: Take 1 tablet by mouth 2 (two) times daily.) 180 tablet 3   VASCEPA 1 g capsule Take 2 capsules (2 g total) by mouth 2 (two) times daily. 360 capsule 3   No current facility-administered medications for this visit.     Family History  Problem Relation Age of Onset   Emphysema Mother        pulmonary   Heart attack Father 37   Lung cancer Sister    Esophageal cancer Brother    Esophageal cancer Brother    Colon cancer Neg Hx    Inflammatory bowel disease Neg Hx    Liver disease Neg Hx    Pancreatic cancer Neg Hx    Rectal cancer Neg Hx    Stomach cancer Neg Hx    Colon polyps Neg Hx    Sleep apnea Neg Hx        Physical Exam: Appears healthy Lungs: Clear Card: RR with harsh murmur Ext: no  edema Neuro: intact     Diagnostic Studies & Laboratory data: I have personally reviewed the following studies and agree with the findings   TTE (03/2023) IMPRESSIONS     1. Left ventricular ejection fraction, by estimation, is 55 to 60%. The  left ventricle has normal function. The left ventricle has no regional  wall motion abnormalities. Left ventricular diastolic parameters are  consistent with Grade I diastolic  dysfunction (impaired relaxation).   2. Right ventricular systolic function is normal. The right ventricular  size is normal. Tricuspid regurgitation signal is inadequate for assessing  PA pressure.   3. Left atrial size was mildly dilated.   4. The mitral valve is normal in structure. No evidence of mitral valve  regurgitation. No evidence of mitral stenosis.   5. The aortic valve is tricuspid. There is severe calcifcation of the  aortic valve. Aortic valve regurgitation is trivial. Paradoxical low  flow/low gradient severe aortic valve stenosis. Aortic valve area, by VTI  measures 0.76 cm. Aortic valve mean  gradient measures 31.0 mmHg.   6. The inferior vena cava is normal in size with greater than 50%  respiratory variability, suggesting right atrial pressure of 3 mmHg.   FINDINGS   Left Ventricle: Left ventricular ejection fraction, by estimation, is 55  to 60%. The left ventricle has normal function. The left ventricle has no  regional wall motion abnormalities. The left ventricular internal cavity  size was normal in size. There is   no left ventricular hypertrophy. Left ventricular diastolic parameters  are consistent with Grade I diastolic dysfunction (impaired relaxation).   Right Ventricle: The right ventricular size is normal. No increase in  right ventricular wall thickness. Right ventricular systolic function is  normal. Tricuspid regurgitation signal is inadequate for assessing PA  pressure.   Left Atrium: Left atrial size was mildly dilated.    Right Atrium: Right atrial size was normal in size.   Pericardium: Trivial pericardial effusion is present.   Mitral Valve: The mitral valve is normal in structure. No evidence of  mitral valve regurgitation. No evidence of mitral valve stenosis.   Tricuspid Valve: The tricuspid valve is normal in structure. Tricuspid  valve regurgitation is not demonstrated.   Aortic Valve: The aortic valve is tricuspid. There is severe calcifcation  of the aortic valve. Aortic valve regurgitation is trivial. Aortic  regurgitation PHT measures 329 msec. Severe aortic stenosis is present.  Aortic valve mean gradient measures 31.0  mmHg. Aortic valve peak gradient measures 52.7 mmHg. Aortic valve area, by  VTI measures 0.76 cm.   Pulmonic Valve: The pulmonic valve was normal in structure. Pulmonic valve  regurgitation is not visualized.   Aorta: The aortic root is normal in size and structure.   Venous: The inferior vena cava is normal in size with greater than 50%  respiratory variability, suggesting right atrial pressure of 3 mmHg.   IAS/Shunts: No atrial level shunt detected by color flow Doppler.     LEFT VENTRICLE  PLAX 2D  LVIDd:         4.70 cm   Diastology  LVIDs:         2.90 cm   LV e' medial:    6.20 cm/s  LV PW:         1.30 cm   LV E/e' medial:  8.7  LV IVS:        1.00 cm   LV e' lateral:   5.84 cm/s  LVOT diam:     2.10 cm   LV E/e' lateral: 9.2  LV SV:         57  LV SV Index:   27  LVOT Area:     3.46 cm                             3D Volume EF:                           3D EF:        56 %                           LV EDV:       121 ml                           LV ESV:       53 ml                           LV SV:        67 ml   RIGHT VENTRICLE  RV Basal diam:  2.00 cm  RV S prime:     14.30 cm/s  TAPSE (M-mode): 2.5 cm   LEFT ATRIUM             Index        RIGHT ATRIUM           Index  LA diam:  4.90 cm 2.32 cm/m   RA Area:     13.20 cm  LA Vol (A2C):    65.5 ml 30.99 ml/m  RA Volume:   24.70 ml  11.69 ml/m  LA Vol (A4C):   59.2 ml 28.01 ml/m  LA Biplane Vol: 65.0 ml 30.76 ml/m   AORTIC VALVE  AV Area (Vmax):    0.82 cm  AV Area (Vmean):   0.79 cm  AV Area (VTI):     0.76 cm  AV Vmax:           363.00 cm/s  AV Vmean:          255.000 cm/s  AV VTI:            0.749 m  AV Peak Grad:      52.7 mmHg  AV Mean Grad:      31.0 mmHg  LVOT Vmax:         86.30 cm/s  LVOT Vmean:        57.900 cm/s  LVOT VTI:          0.164 m  LVOT/AV VTI ratio: 0.22  AI PHT:            329 msec    AORTA  Ao Root diam: 3.00 cm  Ao Asc diam:  3.20 cm   MITRAL VALVE  MV Area (PHT): 3.25 cm     SHUNTS  MV Decel Time: 233 msec     Systemic VTI:  0.16 m  MV E velocity: 53.80 cm/s   Systemic Diam: 2.10 cm  MV A velocity: 115.67 cm/s  MV E/A ratio:  0.47   Cath (03/2023) lusion  Coronary angiography 04/23/2023: LM: No true left main seen         Patient appears to have 2 separate ostia, one for the LAD and the other for ramus and left circumflex. LAD: Prox 30% disease          Bifurcation stents in prox mid LAD and diag 1          LAD stent has minimal 30% late lumen loss          Ostial diag 1 stent has 90% ISR Ramus: Ostial ramus tent with no restenosis Lcx: Mid 50% stenosis RCA: Minimal luminal irregularities with mid 30% disease   Right heart catheterization 04/23/2023: RA: 2 mmHg RV: 49/1 mmHg PA: 48/20 mmHg, mPAP 31 mmHg PCW: 9 mmHg   AO sats: 97% PA sats: 80% RV sats 82% RA sats 80% PA sats rather high, but no true step up seen in shunt run   CO: 7.4 L/min CI: 3.4 L/min/m2   Multivessel coronary artery disease Moderate nonobstructive disease with patent stents, except severe ISR in ostial diag stent   In absence of angina and proximal severe stenoses, we may proceed with TAVR.   Recent Radiology Findings:  CTA (04/2023) FINDINGS: Image quality: Excellent.   Noise artifact is: Limited.   Valve Morphology: Tricuspid  aortic valve with diffuse severe calcifications. Severely restricted leaflet motion in systole.   Aortic Valve Calcium score: 3096   Aortic annular dimension:   Phase assessed: 20%   Annular area: 515 mm2   Annular perimeter: 81.9 mm   Max diameter: 28.7 mm   Min diameter: 23.9 mm   Annular and subannular calcification: Mild, single calcification under the LCC that extends into the LVOT.   Membranous septum length: 10.9 mm   Optimal coplanar projection: LAO 2 CAU 2  Coronary Artery Height above Annulus:   Left Main: 9.5 mm   Right Coronary: 17.5 mm   Sinus of Valsalva Measurements:   Non-coronary: 34 mm   Right-coronary: 32 mm   Left-coronary: 35 mm   Sinus of Valsalva Height:   Non-coronary: 24.4 mm   Right-coronary: 22.0 mm   Left-coronary: 18.2 mm   Sinotubular Junction: 29 mm   Ascending Thoracic Aorta: 36 mm   Coronary Arteries: Normal coronary origin. Right dominance. The study was performed without use of NTG and is insufficient for plaque evaluation. Please refer to recent cardiac catheterization for coronary assessment. Prior stents noted.   Cardiac Morphology:   Right Atrium: Right atrial size is within normal limits.   Right Ventricle: The right ventricular cavity is within normal limits.   Left Atrium: Left atrial size is normal in size with no left atrial appendage filling defect.   Left Ventricle: The ventricular cavity size is within normal limits.   Pulmonary arteries: Dilated pulmonary artery suggestive of pulmonary hypertension.   Pulmonary veins: Normal pulmonary venous drainage.   Pericardium: Normal thickness with no significant effusion or calcium present.   Mitral Valve: The mitral valve is normal structure without significant calcification.   Extra-cardiac findings: See attached radiology report for non-cardiac structures.   IMPRESSION: 1. Annular measurements support a 26 mm S3 or 34 mm Evolut Pro.   2. No  significant annular or subannular calcifications.   3. Shallow left main height (9.5 mm) but aortic root dimensions are noted to be large.   4. Optimal Fluoroscopic Angle for Delivery: LAO 2 CAU 2   5. Dilated pulmonary artery suggestive of pulmonary hypertension.      Recent Lab Findings: Lab Results  Component Value Date   WBC 5.7 04/16/2023   HGB 14.3 04/23/2023   HCT 42.0 04/23/2023   PLT 111 (L) 04/16/2023   GLUCOSE 126 (H) 04/16/2023   CHOL 142 11/03/2022   TRIG 68.0 11/03/2022   HDL 70.20 11/03/2022   LDLCALC 58 11/03/2022   ALT 22 12/18/2022   AST 33 12/18/2022   NA 141 04/23/2023   K 3.6 04/23/2023   CL 106 04/16/2023   CREATININE 0.60 (L) 04/16/2023   BUN 13 04/16/2023   CO2 23 04/16/2023   TSH 1.85 12/10/2022   INR 1.2 (H) 04/22/2022   HGBA1C 6.8 (H) 12/10/2022      Assessment / Plan:     73 yo male with NYHA class 2 symptoms of severe AS with normal LV function with prior cardiac stenting but without need for intervention on recent cath. He has a class I indication for AVR and due to his age, pulmonary fibrosis and cirrhosis would best be served with TAVR with bailout if needed. He will need to be off plavix for a week. We discussed all the risks and goals and recovery from surgery and he understands and wishes to proceed.   I have spent 60 min in review of the records, viewing studies and in face to face with patient and in coordination of future care    Eugenio Hoes 05/19/2023 6:39 PM

## 2023-05-19 NOTE — Telephone Encounter (Signed)
 He is requesting refill on Hycodan 1.5mg  for persistent cough.   Pharm is [facility] on [address] and [employer]  Please ask DOD if Dr. Judie Petit is gone. He is "hacking away here."

## 2023-05-20 ENCOUNTER — Telehealth: Payer: Self-pay | Admitting: Pulmonary Disease

## 2023-05-20 ENCOUNTER — Institutional Professional Consult (permissible substitution) (INDEPENDENT_AMBULATORY_CARE_PROVIDER_SITE_OTHER): Payer: Medicare Other | Admitting: Thoracic Surgery (Cardiothoracic Vascular Surgery)

## 2023-05-20 ENCOUNTER — Encounter (HOSPITAL_COMMUNITY)
Admission: RE | Admit: 2023-05-20 | Discharge: 2023-05-20 | Disposition: A | Discharge status: Home / Self Care | Payer: Medicare Other | Source: Ambulatory Visit | Attending: Pulmonary Disease | Admitting: Pulmonary Disease

## 2023-05-20 ENCOUNTER — Encounter: Payer: Self-pay | Admitting: Thoracic Surgery (Cardiothoracic Vascular Surgery)

## 2023-05-20 VITALS — BP 175/93 | HR 87 | Resp 18 | Ht 72.0 in

## 2023-05-20 DIAGNOSIS — J849 Interstitial pulmonary disease, unspecified: Secondary | ICD-10-CM

## 2023-05-20 DIAGNOSIS — I350 Nonrheumatic aortic (valve) stenosis: Secondary | ICD-10-CM

## 2023-05-20 MED ORDER — HYDROCODONE BIT-HOMATROP MBR 5-1.5 MG/5ML PO SOLN: 5.0000 mL | Freq: Four times a day (QID) | ORAL | 0 refills | Status: DC | PRN

## 2023-05-20 NOTE — Telephone Encounter (Signed)
 Patient is wanting a refill of his cough medicine.

## 2023-05-20 NOTE — Telephone Encounter (Signed)
 Refill has been sent to [facility] on [address] and [address]. Thanks

## 2023-05-20 NOTE — Progress Notes (Signed)
 Daily Session Note  Patient Details  Name: Tyler Hendricks MRN: [id number] Date of Birth: [birth year] Referring Provider:   Doristine Devoid Pulmonary Rehab Walk Test from 04/14/2023 in [facility]  Referring Provider Mannam       Encounter Date: 05/20/2023  Check In:  Session Check In - 05/20/23 1055       Check-In   Supervising physician immediately available to respond to emergencies [facility] MD immediately available    Physician(s) Bernadene Person, NP    Location [facility]    Staff Present Essie Hart, RN, Doris Cheadle, MS, ACSM-CEP, Exercise Physiologist;Jermel Artley Erin Sons BS, ACSM-CEP, Exercise Physiologist;Samantha Belarus, RD, LDN;Jetta Walker BS, ACSM-CEP, Exercise Physiologist    Virtual Visit No    Medication changes reported     No    Fall or balance concerns reported    No    Tobacco Cessation No Change    Warm-up and Cool-down Performed as group-led instruction    Resistance Training Performed Yes    VAD Patient? No    PAD/SET Patient? No      Pain Assessment   Currently in Pain? No/denies             Capillary Blood Glucose: No results found for this or any previous visit (from the past 24 hours).    Social History   Tobacco Use  Smoking Status Former   Current packs/day: 0.00   Average packs/day: 2.0 packs/day for 20.0 years (40.0 ttl pk-yrs)   Types: Cigarettes   Start date: 49   Quit date: 61   Years since quitting: 31.1   Passive exposure: Past  Smokeless Tobacco Never    Goals Met:  Proper associated with RPD/PD & O2 Sat Independence with exercise equipment Exercise tolerated well No report of concerns or symptoms today Strength training completed today  Goals Unmet:  Not Applicable  Comments: Service time is from 1014 to 1145.    Dr. Mechele Collin is Medical Director for Pulmonary Rehab at [facility].

## 2023-05-20 NOTE — Telephone Encounter (Signed)
 Hebert Soho; Percell Locus; Lequita Asal  I am not seeing a progress note from ordering provider. We would need a note with the provider mentioning the need for o2 and them noting or co-signing the patients sats, please.   Please advise

## 2023-05-20 NOTE — Patient Instructions (Signed)
 TAVR Stop plavix a week before procedure

## 2023-05-20 NOTE — Telephone Encounter (Signed)
 I was contacted by Tyler Hendricks, RRT- she states that the pt still has not received o2 from DME  The referral was placed 05/06/23  Routing to [facility]

## 2023-05-20 NOTE — Telephone Encounter (Signed)
 I called pt and informed him of refill. Pt verbalized understanding. NFN

## 2023-05-21 NOTE — Telephone Encounter (Signed)
 Dr. Isaiah Serge, please advise. thanks

## 2023-05-25 ENCOUNTER — Encounter (HOSPITAL_COMMUNITY)
Admission: RE | Admit: 2023-05-25 | Discharge: 2023-05-25 | Disposition: A | Discharge status: Home / Self Care | Payer: Medicare Other | Source: Ambulatory Visit | Attending: Pulmonary Disease | Admitting: Pulmonary Disease

## 2023-05-25 VITALS — Wt 196.2 lb

## 2023-05-25 DIAGNOSIS — J849 Interstitial pulmonary disease, unspecified: Secondary | ICD-10-CM | POA: Diagnosis not present

## 2023-05-25 NOTE — Progress Notes (Signed)
 Daily Session Note  Patient Details  Name: Tyler Hendricks MRN: [id number] Date of Birth: 11/15/1950 Referring Provider:   Doristine Devoid Pulmonary Rehab Walk Test from 04/14/20[facility] in [facility]  Referring Provider Mannam       Encounter Date: 05/25/2023  Check In:  Session Check In - 05/25/23 1059       Check-In   Supervising physician immediately available to respond to emergencies [facility] MD immediately available    Physician(s) Bernadene Person, NP    [facility]    Staff Present Essie Hart, RN, Doris Cheadle, MS, ACSM-CEP, Exercise Physiologist;Hason Ofarrell Erin Sons BS, ACSM-CEP, Exercise Physiologist;Johnny Hale Bogus, MS, Exercise Physiologist    Virtual Visit No    Medication changes reported     No    Fall or balance concerns reported    No    Tobacco Cessation No Change    Warm-up and Cool-down Performed as group-led instruction    Resistance Training Performed Yes    VAD Patient? No    PAD/SET Patient? No      Pain Assessment   Currently in Pain? No/denies    Pain Score 0-No pain    Multiple Pain Sites No             Capillary Blood Glucose: No results found for this or any previous visit (from the past 24 hours).   Exercise Prescription Changes - 05/25/23 1100       Response to Exercise   Blood Pressure (Admit) 118/60    Blood Pressure (Exercise) 120/70    Blood Pressure (Exit) 104/60    Heart Rate (Admit) 61 bpm    Heart Rate (Exercise) 78 bpm    Heart Rate (Exit) 69 bpm    Oxygen Saturation (Admit) 95 %    Oxygen Saturation (Exercise) 88 %    Oxygen Saturation (Exit) 93 %    Rating of Perceived Exertion (Exercise) 11    Perceived Dyspnea (Exercise) 1    Duration Continue with 30 min of aerobic exercise without signs/symptoms of physical distress.    Intensity THRR unchanged      Progression   Progression Continue to progress workloads to maintain intensity without  signs/symptoms of physical distress.      Resistance Training   Training Prescription Yes    Weight blue bands    Reps 10-15    Time 10 Minutes      Oxygen   Oxygen Continuous    Liters 2-4      Treadmill   MPH 2    Grade 1    Minutes 15    METs 2.81      Bike   Level 4    Watts 30    Minutes 15    METs 3.1      Oxygen   Maintain Oxygen Saturation 88% or higher             Social History   Tobacco Use  Smoking Status Former   Current packs/day: 0.00   Average packs/day: 2.0 packs/day for 20.0 years (40.0 ttl pk-yrs)   Types: Cigarettes   Start date: 40   Quit date: 60   Years since quitting: 31.1   Passive exposure: Past  Smokeless Tobacco Never    Goals Met:  Proper associated with RPD/PD & O2 Sat Independence with exercise equipment Exercise tolerated well No report of concerns or symptoms today Strength training completed today  Goals Unmet:  Not Applicable  Comments: Service time is from 1010 to 1129.    Dr. Mechele Collin is Medical Director for Pulmonary Rehab at [facility].

## 2023-05-27 ENCOUNTER — Encounter (HOSPITAL_COMMUNITY)
Admission: RE | Admit: 2023-05-27 | Discharge: 2023-05-27 | Disposition: A | Discharge status: Home / Self Care | Payer: Medicare Other | Source: Ambulatory Visit | Attending: Pulmonary Disease | Admitting: Pulmonary Disease

## 2023-05-27 DIAGNOSIS — L570 Actinic keratosis: Secondary | ICD-10-CM | POA: Diagnosis not present

## 2023-05-27 DIAGNOSIS — D225 Melanocytic nevi of trunk: Secondary | ICD-10-CM | POA: Diagnosis not present

## 2023-05-27 DIAGNOSIS — L821 Other seborrheic keratosis: Secondary | ICD-10-CM | POA: Diagnosis not present

## 2023-05-27 DIAGNOSIS — Z08 Encounter for follow-up examination after completed treatment for malignant neoplasm: Secondary | ICD-10-CM | POA: Diagnosis not present

## 2023-05-27 DIAGNOSIS — Z85828 Personal history of other malignant neoplasm of skin: Secondary | ICD-10-CM | POA: Diagnosis not present

## 2023-05-27 DIAGNOSIS — C44612 Basal cell carcinoma of skin of right upper limb, including shoulder: Secondary | ICD-10-CM | POA: Diagnosis not present

## 2023-05-27 DIAGNOSIS — J849 Interstitial pulmonary disease, unspecified: Secondary | ICD-10-CM

## 2023-05-27 DIAGNOSIS — L814 Other melanin hyperpigmentation: Secondary | ICD-10-CM | POA: Diagnosis not present

## 2023-05-27 DIAGNOSIS — D492 Neoplasm of unspecified behavior of bone, soft tissue, and skin: Secondary | ICD-10-CM | POA: Diagnosis not present

## 2023-05-27 NOTE — Progress Notes (Signed)
 Daily Session Note  Patient Details  Name: Tyler Hendricks MRN: [id number] Date of Birth: October 09, 1950 Referring Provider:   Doristine Devoid Pulmonary Rehab Walk Test from 04/14/2023 in [facility]  Referring Provider Mannam       Encounter Date: 05/27/2023  Check In:  Session Check In - 05/27/23 1039       Check-In   Supervising physician immediately available to respond to emergencies [facility] MD immediately available    Physician(s) Robin Searing, NP    Location [facility]    Staff Present Essie Hart, RN, Doris Cheadle, MS, ACSM-CEP, Exercise Physiologist;Jolynn Bajorek Erin Sons BS, ACSM-CEP, Exercise Physiologist    Virtual Visit No    Medication changes reported     No    Fall or balance concerns reported    No    Tobacco Cessation No Change    Warm-up and Cool-down Performed as group-led instruction    Resistance Training Performed Yes    VAD Patient? No    PAD/SET Patient? No      Pain Assessment   Currently in Pain? No/denies             Capillary Blood Glucose: No results found for this or any previous visit (from the past 24 hours).    Social History   Tobacco Use  Smoking Status Former   Current packs/day: 0.00   Average packs/day: 2.0 packs/day for 20.0 years (40.0 ttl pk-yrs)   Types: Cigarettes   Start date: 49   Quit date: 1   Years since quitting: 31.1   Passive exposure: Past  Smokeless Tobacco Never    Goals Met:  Proper associated with RPD/PD & O2 Sat Independence with exercise equipment Exercise tolerated well No report of concerns or symptoms today Strength training completed today  Goals Unmet:  Not Applicable  Comments: Service time is from 1015 to 1129.    Dr. Mechele Collin is Medical Director for Pulmonary Rehab at [facility].

## 2023-05-28 ENCOUNTER — Other Ambulatory Visit: Payer: Self-pay

## 2023-05-28 DIAGNOSIS — I350 Nonrheumatic aortic (valve) stenosis: Secondary | ICD-10-CM

## 2023-05-31 ENCOUNTER — Other Ambulatory Visit: Payer: Self-pay

## 2023-05-31 NOTE — Progress Notes (Signed)
 Specialty Pharmacy Refill Coordination Note  Tyler Hendricks is a 73 y.o. male contacted today regarding refills of specialty medication(s) Bictegravir-Emtricitab-Tenofov Musician)   Patient requested (Patient-Rptd) Delivery   Delivery date: (Patient-Rptd) 06/11/23   Verified address: (Patient-Rptd) [address]   Medication will be filled on 03.20.25.

## 2023-06-01 ENCOUNTER — Encounter (HOSPITAL_COMMUNITY)
Admission: RE | Admit: 2023-06-01 | Discharge: 2023-06-01 | Disposition: A | Discharge status: Home / Self Care | Payer: Medicare Other | Source: Ambulatory Visit | Attending: Pulmonary Disease

## 2023-06-01 DIAGNOSIS — J849 Interstitial pulmonary disease, unspecified: Secondary | ICD-10-CM | POA: Diagnosis not present

## 2023-06-01 NOTE — Progress Notes (Signed)
 Daily Session Note  Patient Details  Name: Tyler Hendricks MRN: [id number] Date of Birth: April 03, 1950 Referring Provider:   Doristine Devoid Pulmonary Rehab Walk Test from 04/14/2023 in [facility]  Referring Provider Mannam       Encounter Date: 06/01/2023  Check In:  Session Check In - 06/01/23 1136       Check-In   Supervising physician immediately available to respond to emergencies [facility] MD immediately available    Physician(s) Tereso Newcomer, PA    [facility]    Staff Present Raford Pitcher, MS, ACSM-CEP, Exercise Physiologist;Aryelle Figg Erin Sons BS, ACSM-CEP, Exercise Physiologist    Virtual Visit No    Medication changes reported     No    Fall or balance concerns reported    No    Tobacco Cessation No Change    Warm-up and Cool-down Performed as group-led instruction    Resistance Training Performed Yes    VAD Patient? No    PAD/SET Patient? No      Pain Assessment   Currently in Pain? No/denies    Multiple Pain Sites No             Capillary Blood Glucose: No results found for this or any previous visit (from the past 24 hours).    Social History   Tobacco Use  Smoking Status Former   Current packs/day: 0.00   Average packs/day: 2.0 packs/day for 20.0 years (40.0 ttl pk-yrs)   Types: Cigarettes   Start date: 63   Quit date: 54   Years since quitting: 31.2   Passive exposure: Past  Smokeless Tobacco Never    Goals Met:  Proper associated with RPD/PD & O2 Sat Independence with exercise equipment Exercise tolerated well No report of concerns or symptoms today Strength training completed today  Goals Unmet:  Not Applicable  Comments: Service time is from 1008 to 1121.    Dr. Mechele Collin is Medical Director for Pulmonary Rehab at [facility].

## 2023-06-03 ENCOUNTER — Encounter (HOSPITAL_COMMUNITY)
Admission: RE | Admit: 2023-06-03 | Discharge: 2023-06-03 | Disposition: A | Discharge status: Home / Self Care | Payer: Medicare Other | Source: Ambulatory Visit | Attending: Pulmonary Disease | Admitting: Pulmonary Disease

## 2023-06-03 DIAGNOSIS — J849 Interstitial pulmonary disease, unspecified: Secondary | ICD-10-CM | POA: Diagnosis not present

## 2023-06-03 NOTE — Telephone Encounter (Signed)
 Lm for Mitch with [employer].

## 2023-06-03 NOTE — Telephone Encounter (Signed)
 Kaylee from pulmonary rehab states patient has not received oxygen. [employer] does not have order. Kaylee phone number is [phone].

## 2023-06-03 NOTE — Progress Notes (Signed)
 Daily Session Note  Patient Details  Name: Tyler Hendricks MRN: [id number] Date of Birth: [birth year] Referring Provider:   Doristine Devoid Pulmonary Rehab Walk Test from 04/14/2023 in [facility]  Referring Provider Mannam       Encounter Date: 06/03/2023  Check In:  Session Check In - 06/03/23 1123       Check-In   Supervising physician immediately available to respond to emergencies [facility] MD immediately available    Physician(s) Edd Fabian, NP    [facility]    Staff Present Raford Pitcher, MS, ACSM-CEP, Exercise Physiologist;Casey Erin Sons BS, ACSM-CEP, Exercise Physiologist;Johnny Hale Bogus, MS, Exercise Physiologist    Virtual Visit No    Medication changes reported     No    Fall or balance concerns reported    No    Tobacco Cessation No Change    Warm-up and Cool-down Performed as group-led instruction    Resistance Training Performed Yes    VAD Patient? No    PAD/SET Patient? No      Pain Assessment   Currently in Pain? No/denies             Capillary Blood Glucose: No results found for this or any previous visit (from the past 24 hours).    Social History   Tobacco Use  Smoking Status Former   Current packs/day: 0.00   Average packs/day: 2.0 packs/day for 20.0 years (40.0 ttl pk-yrs)   Types: Cigarettes   Start date: 67   Quit date: 23   Years since quitting: 31.2   Passive exposure: Past  Smokeless Tobacco Never    Goals Met:  Independence with exercise equipment Exercise tolerated well No report of concerns or symptoms today Strength training completed today  Goals Unmet:  Not Applicable  Comments: Service time is from 1009 to 1129.    Dr. Mechele Collin is Medical Director for Pulmonary Rehab at [facility].

## 2023-06-03 NOTE — Telephone Encounter (Signed)
 Can you please check on the DME order again? Does not need a separate note from me? Looks like rehab is already documented his need for oxygen.

## 2023-06-04 ENCOUNTER — Other Ambulatory Visit: Payer: Self-pay

## 2023-06-04 ENCOUNTER — Encounter (HOSPITAL_COMMUNITY)
Admission: RE | Admit: 2023-06-04 | Discharge: 2023-06-04 | Disposition: A | Discharge status: Home / Self Care | Source: Ambulatory Visit | Attending: Cardiovascular Disease | Admitting: Cardiovascular Disease

## 2023-06-04 ENCOUNTER — Ambulatory Visit (HOSPITAL_COMMUNITY)
Admission: RE | Admit: 2023-06-04 | Discharge: 2023-06-04 | Disposition: A | Discharge status: Home / Self Care | Source: Ambulatory Visit | Attending: Cardiovascular Disease | Admitting: Cardiovascular Disease

## 2023-06-04 DIAGNOSIS — Z955 Presence of coronary angioplasty implant and graft: Secondary | ICD-10-CM | POA: Diagnosis not present

## 2023-06-04 DIAGNOSIS — Z01818 Encounter for other preprocedural examination: Secondary | ICD-10-CM | POA: Insufficient documentation

## 2023-06-04 DIAGNOSIS — J849 Interstitial pulmonary disease, unspecified: Secondary | ICD-10-CM | POA: Diagnosis not present

## 2023-06-04 DIAGNOSIS — I350 Nonrheumatic aortic (valve) stenosis: Secondary | ICD-10-CM | POA: Diagnosis not present

## 2023-06-04 HISTORY — DX: Pulmonary fibrosis, unspecified: J84.10

## 2023-06-04 LAB — URINALYSIS, ROUTINE W REFLEX MICROSCOPIC
Bacteria, UA: NONE SEEN
Bilirubin Urine: NEGATIVE
Glucose, UA: >=500 mg/dL — AB
Hgb urine dipstick: NEGATIVE
Ketones, ur: NEGATIVE mg/dL
Leukocytes,Ua: NEGATIVE
Nitrite: NEGATIVE
Protein, ur: 100 mg/dL — AB
Specific Gravity, Urine: 1.033 — ABNORMAL HIGH (ref 1.005–1.030)
pH: 5 (ref 5.0–8.0)

## 2023-06-04 LAB — TYPE AND SCREEN
ABO/RH(D): O POS
Antibody Screen: NEGATIVE

## 2023-06-04 LAB — COMPREHENSIVE METABOLIC PANEL
ALT: 19 U/L (ref 0–44)
AST: 38 U/L (ref 15–41)
Albumin: 2.7 g/dL — ABNORMAL LOW (ref 3.5–5.0)
Alkaline Phosphatase: 114 U/L (ref 38–126)
Anion gap: 6 (ref 5–15)
BUN: 9 mg/dL (ref 8–23)
CO2: 29 mmol/L (ref 22–32)
Calcium: 9 mg/dL (ref 8.9–10.3)
Chloride: 105 mmol/L (ref 98–111)
Creatinine, Ser: 0.66 mg/dL (ref 0.61–1.24)
GFR, Estimated: >60 mL/min (ref >60)
Glucose, Bld: 131 mg/dL — ABNORMAL HIGH (ref 70–99)
Potassium: 3.9 mmol/L (ref 3.5–5.1)
Sodium: 140 mmol/L (ref 135–145)
Total Bilirubin: 2.8 mg/dL — ABNORMAL HIGH (ref 0.0–1.2)
Total Protein: 5.9 g/dL — ABNORMAL LOW (ref 6.5–8.1)

## 2023-06-04 LAB — CBC
HCT: 47.3 % (ref 39.0–52.0)
Hemoglobin: 16.2 g/dL (ref 13.0–17.0)
MCH: 34.4 pg — ABNORMAL HIGH (ref 26.0–34.0)
MCHC: 34.2 g/dL (ref 30.0–36.0)
MCV: 100.4 fL — ABNORMAL HIGH (ref 80.0–100.0)
Platelets: 88 10*3/uL — ABNORMAL LOW (ref 150–400)
RBC: 4.71 MIL/uL (ref 4.22–5.81)
RDW: 14.4 % (ref 11.5–15.5)
WBC: 4.3 10*3/uL (ref 4.0–10.5)
nRBC: 0 % (ref 0.0–0.2)

## 2023-06-04 LAB — PROTIME-INR
INR: 1.2 (ref 0.8–1.2)
Prothrombin Time: 15.3 s — ABNORMAL HIGH (ref 11.4–15.2)

## 2023-06-04 LAB — SURGICAL PCR SCREEN
MRSA, PCR: NEGATIVE
Staphylococcus aureus: NEGATIVE

## 2023-06-04 NOTE — Progress Notes (Signed)
 Patient signed all consents at PAT lab appointment. CHG soap and instructions were given to patient. CHG surgical prep reviewed with patient and all questions answered.  Patients chart send to anesthesia for review. Pt denies any respiratory illness/infection in the last two months. Pt has a chronic cough due to Pulmonary Fibrosis.  Med rec completed at lab appointment.

## 2023-06-04 NOTE — Telephone Encounter (Signed)
 Pt needs visit within 30 days of oxygen being order. Last OV 04/05/2023.  Spoke to patient. He is aware that appt is needed. Scheduled appt for 4/29, as this is first available.  Dr. Isaiah Serge, please advise if you want to see pt sooner then 4/29.

## 2023-06-04 NOTE — Telephone Encounter (Signed)
 Tyler Hendricks; [name] I am not seeing a progress note within the last 30days where the provider documented the sats.

## 2023-06-07 ENCOUNTER — Encounter (HOSPITAL_COMMUNITY): Payer: Self-pay | Admitting: Cardiovascular Disease

## 2023-06-07 ENCOUNTER — Encounter (HOSPITAL_COMMUNITY): Payer: Self-pay

## 2023-06-07 MED ORDER — CEFAZOLIN SODIUM-DEXTROSE 2-4 GM/100ML-% IV SOLN
2.0000 g | INTRAVENOUS | Status: AC
Start: 2023-06-08 — End: 2023-06-09
  Administered 2023-06-08: 2 g via INTRAVENOUS
  Filled 2023-06-07: qty 100

## 2023-06-07 MED ORDER — NOREPINEPHRINE 4 MG/250ML-% IV SOLN
0.0000 ug/min | INTRAVENOUS | Status: DC
  Filled 2023-06-07: qty 250

## 2023-06-07 MED ORDER — HEPARIN 30,000 UNITS/1000 ML (OHS) CELLSAVER SOLUTION
Status: DC
  Filled 2023-06-07 (×2): qty 1000

## 2023-06-07 MED ORDER — DEXMEDETOMIDINE HCL IN NACL 400 MCG/100ML IV SOLN
0.1000 ug/kg/h | INTRAVENOUS | Status: AC
  Administered 2023-06-08: 1 ug/kg/h via INTRAVENOUS
  Administered 2023-06-08: 89 ug via INTRAVENOUS
  Filled 2023-06-07: qty 100

## 2023-06-07 MED ORDER — POTASSIUM CHLORIDE 2 MEQ/ML IV SOLN
80.0000 meq | INTRAVENOUS | Status: DC
  Filled 2023-06-07 (×2): qty 40

## 2023-06-07 MED ORDER — MAGNESIUM SULFATE 50 % IJ SOLN
40.0000 meq | INTRAMUSCULAR | Status: DC
  Filled 2023-06-07 (×2): qty 9.85

## 2023-06-07 NOTE — H&P (Signed)
 [address]       [place] [address]             [phone]                                   [facility] Medical Record #[id number] Date of Birth: 1950/11/24   Elder Negus, MD Myrlene Broker, MD   Chief Complaint: Increasing DOE     History of Present Illness:     Pt is a very pleasant 73 yo male who has been found to have now increasing DOE. He has been followed for moderate AS since 2018. He has a cardiac history of stenting of his coronaries when he was in [place]. A repeat TTE has found him to now have normal LV function with an ef of 55% and severe AS with as mean gradient of but with an AVA of 0.76cm2. His DVI is .22  He was evaluated by Dr Clifton James and felt to need AVR. He underwent cath with an instent stenosis of his diagonal but overall felt not to require intervention and TAVR CTA with acceptable femoral access and valve sized to a 26mm Sapien valve. He has been HIV positive since 1989 and has undetectable viral load but also suffers from pulmonary fibrosis which his Pulmonologist suggests going on evening home O2 ( has not been delivered yet). He also has cirrhosis and with some esophageal varicies             Past Medical History:  Diagnosis Date   Atherosclerotic heart disease of native coronary artery without angina pectoris     CAD (coronary artery disease)     Coronary angioplasty status     Diabetes mellitus without complication (HCC)     Essential (primary) hypertension     Gallstones     Hepatic cirrhosis (HCC)     Hepatitis B core antibody positive 01/08/2019   Hepatitis C     HIV infection (HCC)     Hyperlipidemia     Hypertension     Internal hemorrhoids     Kidney stones     Mixed hyperlipidemia     Severe aortic stenosis     Sleep apnea      CPAP   Tubular adenoma of colon                 Past Surgical History:  Procedure Laterality Date   CARDIAC CATHETERIZATION Left 04/2013    chlecystectomy       CHOLECYSTECTOMY       COLONOSCOPY   05/02/2013   GAS/FLUID EXCHANGE Right 12/30/2021    Procedure: GAS/FLUID EXCHANGE;  Surgeon: Carmela Rima, MD;  Location: [facility] OR;  Service: Ophthalmology;  Laterality: Right;  SF6   PARS PLANA VITRECTOMY Right 12/30/2021    Procedure: PARS PLANA VITRECTOMY WITH 25 GAUGE;  Surgeon: Carmela Rima, MD;  Location: [facility] OR;  Service: Ophthalmology;  Laterality: Right;   PERCUTANEOUS CORONARY STENT INTERVENTION (PCI-S)       PHOTOCOAGULATION WITH LASER Right 12/30/2021    Procedure: PHOTOCOAGULATION WITH LASER;  Surgeon: Carmela Rima, MD;  Location: [facility] OR;  Service: Ophthalmology;  Laterality: Right;   POLYPECTOMY       RIGHT/LEFT HEART CATH AND CORONARY ANGIOGRAPHY N/A 04/23/2023    Procedure: RIGHT/LEFT HEART CATH AND CORONARY ANGIOGRAPHY;  Surgeon: Elder Negus, MD;  [facility];  Service: Cardiovascular;  Laterality: N/A;   UMBILICAL HERNIA REPAIR              Tobacco Use History  Social History        Tobacco Use  Smoking Status Former   Current packs/day: 0.00   Average packs/day: 2.0 packs/day for 20.0 years (40.0 ttl pk-yrs)   Types: Cigarettes   Start date: 68   Quit date: 30   Years since quitting: 31.1   Passive exposure: Past  Smokeless Tobacco Never      Social History        Substance and Sexual Activity  Alcohol Use Not Currently    Comment: social-occ beer      Social History         Socioeconomic History   Marital status: Married      Spouse name: Not on file   Number of children: 1   Years of education: Not on file   Highest education level: 9th grade  Occupational History   Occupation: retired   Occupation: Retired Psychologist, forensic  Tobacco Use   Smoking status: Former      Current packs/day: 0.00      Average packs/day: 2.0 packs/day for 20.0 years (40.0 ttl pk-yrs)      Types: Cigarettes      Start date: 52      Quit date: 1994      Years  since quitting: 31.1      Passive exposure: Past   Smokeless tobacco: Never  Vaping Use   Vaping status: Never Used  Substance and Sexual Activity   Alcohol use: Not Currently      Comment: social-occ beer   Drug use: No   Sexual activity: Yes  Other Topics Concern   Not on file  Social History Narrative   Not on file    Social Drivers of Health        Financial Resource Strain: Low Risk  (11/02/2022)    Overall Financial Resource Strain (CARDIA)     Difficulty of Paying Living Expenses: Not hard at all  Food Insecurity: No Food Insecurity (11/02/2022)    Hunger Vital Sign     Worried About Running Out of Food in the Last Year: Never true     Ran Out of Food in the Last Year: Never true  Transportation Needs: No Transportation Needs (11/02/2022)    PRAPARE - Therapist, art (Medical): No     Lack of Transportation (Non-Medical): No  Physical Activity: Insufficiently Active (11/02/2022)    Exercise Vital Sign     Days of Exercise per Week: 2 days     Minutes of Exercise per Session: 30 min  Stress: Stress Concern Present (11/02/2022)    [employer] of Occupational Health - Occupational Stress Questionnaire     Feeling of Stress : To some extent  Social Connections: Moderately Isolated (11/02/2022)    Social Connection and Isolation Panel [NHANES]     Frequency of Communication with Friends and Family: More than three times a week     Frequency of Social Gatherings with Friends and Family: Once a week     Attends Religious Services: Never     Database administrator or Organizations: No     Attends Banker Meetings: Not on file     Marital Status: Married  Intimate Partner Violence: Not At Risk (12/22/2021)    Humiliation, Afraid, Rape,  and Kick questionnaire     Fear of Current or Ex-Partner: No     Emotionally Abused: No     Physically Abused: No     Sexually Abused: No      Allergies       Allergies  Allergen Reactions    Integrilin [Eptifibatide] Other (See Comments)      Bleeding (non-specific)   Lopressor [Metoprolol Tartrate] Rash and Other (See Comments)      Bleeding (non-specific)    Penicillins Diarrhea and Rash              Current Outpatient Medications  Medication Sig Dispense Refill   amLODipine (NORVASC) 2.5 MG tablet TAKE 1 TABLET BY MOUTH EVERY DAY 90 tablet 3   atenolol (TENORMIN) 50 MG tablet TAKE 1 TABLET BY MOUTH EVERY DAY 90 tablet 3   bictegravir-emtricitabine-tenofovir AF (BIKTARVY) 50-200-25 MG TABS tablet Take 1 tablet by mouth daily. 30 tablet 11   cholecalciferol (VITAMIN D3) 25 MCG (1000 UNIT) tablet Take 1,000 Units by mouth daily.       clopidogrel (PLAVIX) 75 MG tablet TAKE 1 TABLET BY MOUTH EVERY DAY 90 tablet 3   HYDROcodone bit-homatropine (HYCODAN) 5-1.5 MG/5ML syrup Take 5 mLs by mouth every 6 (six) hours as needed (chronic cough - pulmonary fibrosis). 473 mL 0   losartan-hydrochlorothiazide (HYZAAR) 100-25 MG tablet Take 1 tablet by mouth daily. 90 tablet 3   Multiple Vitamin (MULTIVITAMIN) tablet Take 1 tablet by mouth daily.       OFEV 100 MG CAPS TAKE 1 CAPSULE BY MOUTH 2 TIMES A DAY WITH FOOD. TAKE 12 HOURS APART. 180 capsule 1   Oxycodone HCl 10 MG TABS Take 10 mg by mouth as needed (Pain).       rosuvastatin (CRESTOR) 10 MG tablet Take 1 tablet (10 mg total) by mouth daily. 90 tablet 3   SYNJARDY XR 12.07-998 MG TB24 Take 2 tablets by mouth daily. (Patient taking differently: Take 1 tablet by mouth 2 (two) times daily.) 180 tablet 3   VASCEPA 1 g capsule Take 2 capsules (2 g total) by mouth 2 (two) times daily. 360 capsule 3      No current facility-administered medications for this visit.               Family History  Problem Relation Age of Onset   Emphysema Mother          pulmonary   Heart attack Father 56   Lung cancer Sister     Esophageal cancer Brother     Esophageal cancer Brother     Colon cancer Neg Hx     Inflammatory bowel disease Neg Hx      Liver disease Neg Hx     Pancreatic cancer Neg Hx     Rectal cancer Neg Hx     Stomach cancer Neg Hx     Colon polyps Neg Hx     Sleep apnea Neg Hx                  Physical Exam: Appears healthy Lungs: Clear Card: RR with harsh murmur Ext: no edema Neuro: intact         Diagnostic Studies & Laboratory data: I have personally reviewed the following studies and agree with the findings   TTE (03/2023) IMPRESSIONS     1. Left ventricular ejection fraction, by estimation, is 55 to 60%. The  left ventricle has normal function. The left ventricle has no regional  wall motion abnormalities. Left ventricular diastolic parameters are  consistent with Grade I diastolic  dysfunction (impaired relaxation).   2. Right ventricular systolic function is normal. The right ventricular  size is normal. Tricuspid regurgitation signal is inadequate for assessing  PA pressure.   3. Left atrial size was mildly dilated.   4. The mitral valve is normal in structure. No evidence of mitral valve  regurgitation. No evidence of mitral stenosis.   5. The aortic valve is tricuspid. There is severe calcifcation of the  aortic valve. Aortic valve regurgitation is trivial. Paradoxical low  flow/low gradient severe aortic valve stenosis. Aortic valve area, by VTI  measures 0.76 cm. Aortic valve mean  gradient measures 31.0 mmHg.   6. The inferior vena cava is normal in size with greater than 50%  respiratory variability, suggesting right atrial pressure of 3 mmHg.   FINDINGS   Left Ventricle: Left ventricular ejection fraction, by estimation, is 55  to 60%. The left ventricle has normal function. The left ventricle has no  regional wall motion abnormalities. The left ventricular internal cavity  size was normal in size. There is   no left ventricular hypertrophy. Left ventricular diastolic parameters  are consistent with Grade I diastolic dysfunction (impaired relaxation).   Right Ventricle:  The right ventricular size is normal. No increase in  right ventricular wall thickness. Right ventricular systolic function is  normal. Tricuspid regurgitation signal is inadequate for assessing PA  pressure.   Left Atrium: Left atrial size was mildly dilated.   Right Atrium: Right atrial size was normal in size.   Pericardium: Trivial pericardial effusion is present.   Mitral Valve: The mitral valve is normal in structure. No evidence of  mitral valve regurgitation. No evidence of mitral valve stenosis.   Tricuspid Valve: The tricuspid valve is normal in structure. Tricuspid  valve regurgitation is not demonstrated.   Aortic Valve: The aortic valve is tricuspid. There is severe calcifcation  of the aortic valve. Aortic valve regurgitation is trivial. Aortic  regurgitation PHT measures 329 msec. Severe aortic stenosis is present.  Aortic valve mean gradient measures 31.0  mmHg. Aortic valve peak gradient measures 52.7 mmHg. Aortic valve area, by  VTI measures 0.76 cm.   Pulmonic Valve: The pulmonic valve was normal in structure. Pulmonic valve  regurgitation is not visualized.   Aorta: The aortic root is normal in size and structure.   Venous: The inferior vena cava is normal in size with greater than 50%  respiratory variability, suggesting right atrial pressure of 3 mmHg.   IAS/Shunts: No atrial level shunt detected by color flow Doppler.     LEFT VENTRICLE  PLAX 2D  LVIDd:         4.70 cm   Diastology  LVIDs:         2.90 cm   LV e' medial:    6.20 cm/s  LV PW:         1.30 cm   LV E/e' medial:  8.7  LV IVS:        1.00 cm   LV e' lateral:   5.84 cm/s  LVOT diam:     2.10 cm   LV E/e' lateral: 9.2  LV SV:         57  LV SV Index:   27  LVOT Area:     3.46 cm  3D Volume EF:                           3D EF:        56 %                           LV EDV:       121 ml                           LV ESV:       53 ml                            LV SV:        67 ml   RIGHT VENTRICLE  RV Basal diam:  2.00 cm  RV S prime:     14.30 cm/s  TAPSE (M-mode): 2.5 cm   LEFT ATRIUM             Index        RIGHT ATRIUM           Index  LA diam:        4.90 cm 2.32 cm/m   RA Area:     13.20 cm  LA Vol (A2C):   65.5 ml 30.99 ml/m  RA Volume:   24.70 ml  11.69 ml/m  LA Vol (A4C):   59.2 ml 28.01 ml/m  LA Biplane Vol: 65.0 ml 30.76 ml/m   AORTIC VALVE  AV Area (Vmax):    0.82 cm  AV Area (Vmean):   0.79 cm  AV Area (VTI):     0.76 cm  AV Vmax:           363.00 cm/s  AV Vmean:          255.000 cm/s  AV VTI:            0.749 m  AV Peak Grad:      52.7 mmHg  AV Mean Grad:      31.0 mmHg  LVOT Vmax:         86.30 cm/s  LVOT Vmean:        57.900 cm/s  LVOT VTI:          0.164 m  LVOT/AV VTI ratio: 0.22  AI PHT:            329 msec    AORTA  Ao Root diam: 3.00 cm  Ao Asc diam:  3.20 cm   MITRAL VALVE  MV Area (PHT): 3.25 cm     SHUNTS  MV Decel Time: 233 msec     Systemic VTI:  0.16 m  MV E velocity: 53.80 cm/s   Systemic Diam: 2.10 cm  MV A velocity: 115.67 cm/s  MV E/A ratio:  0.47    Cath (03/2023) lusion   Coronary angiography 04/23/2023: LM: No true left main seen         Patient appears to have 2 separate ostia, one for the LAD and the other for ramus and left circumflex. LAD: Prox 30% disease          Bifurcation stents in prox mid LAD and diag 1          LAD stent has minimal 30% late lumen loss          Ostial diag 1 stent  has 90% ISR Ramus: Ostial ramus tent with no restenosis Lcx: Mid 50% stenosis RCA: Minimal luminal irregularities with mid 30% disease   Right heart catheterization 04/23/2023: RA: 2 mmHg RV: 49/1 mmHg PA: 48/20 mmHg, mPAP 31 mmHg PCW: 9 mmHg   AO sats: 97% PA sats: 80% RV sats 82% RA sats 80% PA sats rather high, but no true step up seen in shunt run   CO: 7.4 L/min CI: 3.4 L/min/m2   Multivessel coronary artery disease Moderate nonobstructive disease with patent stents,  except severe ISR in ostial diag stent   In absence of angina and proximal severe stenoses, we may proceed with TAVR.    Recent Radiology Findings:  CTA (04/2023) FINDINGS: Image quality: Excellent.   Noise artifact is: Limited.   Valve Morphology: Tricuspid aortic valve with diffuse severe calcifications. Severely restricted leaflet motion in systole.   Aortic Valve Calcium score: 3096   Aortic annular dimension:   Phase assessed: 20%   Annular area: 515 mm2   Annular perimeter: 81.9 mm   Max diameter: 28.7 mm   Min diameter: 23.9 mm   Annular and subannular calcification: Mild, single calcification under the LCC that extends into the LVOT.   Membranous septum length: 10.9 mm   Optimal coplanar projection: LAO 2 CAU 2   Coronary Artery Height above Annulus:   Left Main: 9.5 mm   Right Coronary: 17.5 mm   Sinus of Valsalva Measurements:   Non-coronary: 34 mm   Right-coronary: 32 mm   Left-coronary: 35 mm   Sinus of Valsalva Height:   Non-coronary: 24.4 mm   Right-coronary: 22.0 mm   Left-coronary: 18.2 mm   Sinotubular Junction: 29 mm   Ascending Thoracic Aorta: 36 mm   Coronary Arteries: Normal coronary origin. Right dominance. The study was performed without use of NTG and is insufficient for plaque evaluation. Please refer to recent cardiac catheterization for coronary assessment. Prior stents noted.   Cardiac Morphology:   Right Atrium: Right atrial size is within normal limits.   Right Ventricle: The right ventricular cavity is within normal limits.   Left Atrium: Left atrial size is normal in size with no left atrial appendage filling defect.   Left Ventricle: The ventricular cavity size is within normal limits.   Pulmonary arteries: Dilated pulmonary artery suggestive of pulmonary hypertension.   Pulmonary veins: Normal pulmonary venous drainage.   Pericardium: Normal thickness with no significant effusion or calcium present.    Mitral Valve: The mitral valve is normal structure without significant calcification.   Extra-cardiac findings: See attached radiology report for non-cardiac structures.   IMPRESSION: 1. Annular measurements support a 26 mm S3 or 34 mm Evolut Pro.   2. No significant annular or subannular calcifications.   3. Shallow left main height (9.5 mm) but aortic root dimensions are noted to be large.   4. Optimal Fluoroscopic Angle for Delivery: LAO 2 CAU 2   5. Dilated pulmonary artery suggestive of pulmonary hypertension.       Recent Lab Findings: Recent Labs       Lab Results  Component Value Date    WBC 5.7 04/16/2023    HGB 14.3 04/23/2023    HCT 42.0 04/23/2023    PLT 111 (L) 04/16/2023    GLUCOSE 126 (H) 04/16/2023    CHOL 142 11/03/2022    TRIG 68.0 11/03/2022    HDL 70.20 11/03/2022    LDLCALC 58 11/03/2022    ALT 22 12/18/2022    AST 33  12/18/2022    NA 141 04/23/2023    K 3.6 04/23/2023    CL 106 04/16/2023    CREATININE 0.60 (L) 04/16/2023    BUN 13 04/16/2023    CO2 23 04/16/2023    TSH 1.85 12/10/2022    INR 1.2 (H) 04/22/2022    HGBA1C 6.8 (H) 12/10/2022            Assessment / Plan:     73 yo male with NYHA class 2 symptoms of severe AS with normal LV function with prior cardiac stenting but without need for intervention on recent cath. He has a class I indication for AVR and due to his age, pulmonary fibrosis and cirrhosis would best be served with TAVR with bailout if needed. He will need to be off plavix for a week. We discussed all the risks and goals and recovery from surgery and he understands and wishes to proceed.

## 2023-06-07 NOTE — Progress Notes (Signed)
   06/03/23 1528  6 Minute Walk  Phase Discharge  Distance 1645 feet  Distance % Change 37.08 %  Distance Feet Change 445 ft  Walk Time 6 minutes  # of Rest Breaks 0  MPH 3.12  METS 3.56  RPE 11  Perceived Dyspnea  2  VO2 Peak 12.48  Symptoms No  Resting HR 64 bpm  Resting BP 130/70  Resting Oxygen Saturation  93 %  Exercise Oxygen Saturation  during 6 min walk 86 %  Max Ex. HR 120 bpm  Max Ex. BP 150/80  2 Minute Post BP 142/72  Interval HR  1 Minute HR 120  2 Minute HR 96  3 Minute HR 82  4 Minute HR 97  5 Minute HR 105  6 Minute HR 104  2 Minute Post HR 67  Interval Heart Rate? Yes  Interval Oxygen  Interval Oxygen? Yes  Baseline Oxygen Saturation % 93 %  1 Minute Oxygen Saturation % 92 %  1 Minute Liters of Oxygen 3 L  2 Minute Oxygen Saturation % 90 %  2 Minute Liters of Oxygen 3 L  3 Minute Oxygen Saturation % 90 % (2:10- 86%)  3 Minute Liters of Oxygen 3 L (increased to 6L)  4 Minute Oxygen Saturation % 89 %  4 Minute Liters of Oxygen 6 L  5 Minute Oxygen Saturation % 88 %  5 Minute Liters of Oxygen 6 L  6 Minute Oxygen Saturation % 88 %  6 Minute Liters of Oxygen 6 L  2 Minute Post Oxygen Saturation % 93 %  2 Minute Post Liters of Oxygen 6 L

## 2023-06-07 NOTE — Progress Notes (Signed)
 Discharge Progress Report  Patient Details  Name: Tyler Hendricks MRN: [id number] Date of Birth: 03/21/1951 Referring Provider:   Doristine Devoid [facility] Re[facility] Walk Test from 04/14/2023 in [facility]  Referring Provider Mannam        Number of Visits: 11  Reason for Discharge:  Early Exit:  Personal  Smoking History:  Social History   Tobacco Use  Smoking Status Former   Current packs/day: 0.00   Average packs/day: 2.0 packs/day for 20.0 years (40.0 ttl pk-yrs)   Types: Cigarettes   Start date: 44   Quit date: 106   Years since quitting: 31.2   Passive exposure: Past  Smokeless Tobacco Never    Diagnosis:  ILD (interstitial lung disease) (HCC)  ADL [facility]:  Pulmonary Assessment Scores     Row Name 04/14/23 1146         ADL [facility]   ADL Phase Entry     SOB Score total 21       CAT Score   CAT Score 10       mMRC Score   mMRC Score 2              Initial Exercise Prescription:  Initial Exercise Prescription - 04/14/23 1100       Date of Initial Exercise RX and Referring Provider   Date 04/14/23    Referring Provider Mannam    Expected Discharge Date 07/08/23      Oxygen   Oxygen Continuous    Liters 2    Maintain Oxygen Saturation 88% or higher      Treadmill   MPH 2    Grade 0    Minutes 15    METs 2.54      Bike   Level 2    Watts 40    Minutes 15    METs 2      Prescription Details   Frequency (times per week) 2    Duration Progress to 30 minutes of continuous aerobic without signs/symptoms of physical distress      Intensity   THRR 40-80% of Max Heartrate 59-118    Ratings of Perceived Exertion 11-13    Perceived Dyspnea 0-4      Progression   Progression Continue to progress workloads to maintain intensity without signs/symptoms of physical distress.      Resistance Training   Training Prescription Yes    Weight blue bands    Reps 10-15              Discharge Exercise Prescription (Final Exercise Prescription Changes):  Exercise Prescription Changes - 05/25/23 1100       Response to Exercise   Blood Pressure (Admit) 118/60    Blood Pressure (Exercise) 120/70    Blood Pressure (Exit) 104/60    Heart Rate (Admit) 61 bpm    Heart Rate (Exercise) 78 bpm    Heart Rate (Exit) 69 bpm    Oxygen Saturation (Admit) 95 %    Oxygen Saturation (Exercise) 88 %    Oxygen Saturation (Exit) 93 %    Rating of Perceived Exertion (Exercise) 11    Perceived Dyspnea (Exercise) 1    Duration Continue with 30 min of aerobic exercise without signs/symptoms of physical distress.    Intensity THRR unchanged      Progression   Progression Continue to progress workloads to maintain intensity without signs/symptoms of physical distress.      Resistance Training  Training Prescription Yes    Weight blue bands    Reps 10-15    Time 10 Minutes      Oxygen   Oxygen Continuous    Liters 2-4      Treadmill   MPH 2    Grade 1    Minutes 15    METs 2.81      Bike   Level 4    Watts 30    Minutes 15    METs 3.1      Oxygen   Maintain Oxygen Saturation 88% or higher             Functional Capacity:  6 Minute Walk     Row Name 04/14/23 1152 06/03/23 1528       6 Minute Walk   Phase Initial Discharge    Distance 1200 feet 1645 feet    Distance % Change -- 37.08 %    Distance Feet Change -- 445 ft    Walk Time 6 minutes 6 minutes    # of Rest Breaks 1  3:03-4:00 0    MPH 2.27 3.12    METS 2.61 3.56    RPE 11 11    Perceived Dyspnea  1 2    VO2 Peak 9.14 12.48    Symptoms No No    Resting HR 58 bpm 64 bpm    Resting BP 136/70 130/70    Resting Oxygen Saturation  92 % 93 %    Exercise Oxygen Saturation  during 6 min walk 85 % 86 %    Max Ex. HR 80 bpm 120 bpm    Max Ex. BP 142/68 150/80    2 Minute Post BP 134/68 142/72      Interval HR   1 Minute HR 71 120    2 Minute HR 77 96    3 Minute HR 77 82    4 Minute HR 75  97    5 Minute HR 79 105    6 Minute HR 80 104    2 Minute Post HR 59 67    Interval Heart Rate? Yes Yes      Interval Oxygen   Interval Oxygen? Yes Yes    Baseline Oxygen Saturation % 92 % 93 %    1 Minute Oxygen Saturation % 93 % 92 %    1 Minute Liters of Oxygen 0 L 3 L    2 Minute Oxygen Saturation % 88 % 90 %    2 Minute Liters of Oxygen 0 L 3 L    3 Minute Oxygen Saturation % 85 % 90 %  2:10- 86%    3 Minute Liters of Oxygen 0 L 3 L  increased to 6L    4 Minute Oxygen Saturation % 92 % 89 %    4 Minute Liters of Oxygen 2 L 6 L    5 Minute Oxygen Saturation % 93 % 88 %    5 Minute Liters of Oxygen 2 L 6 L    6 Minute Oxygen Saturation % 89 % 88 %    6 Minute Liters of Oxygen 2 L 6 L    2 Minute Post Oxygen Saturation % 94 % 93 %    2 Minute Post Liters of Oxygen 2 L 6 L             Psychological, QOL, Others - Outcomes: PHQ 2/9:    04/14/2023   10:36 AM 01/01/2023    9:30  AM 07/10/2022    9:29 AM 05/06/2022    1:38 PM 01/02/2022    9:39 AM  Depression screen PHQ 2/9  Decreased Interest 0 0 0 0 0  Down, Depressed, Hopeless 0 0 0 0 0  PHQ - 2 Score 0 0 0 0 0  Altered sleeping 0      Tired, decreased energy 0      Change in appetite 0      Feeling bad or failure about yourself  0      Trouble concentrating 0      Moving slowly or fidgety/restless 0      Suicidal thoughts 0      PHQ-9 Score 0      Difficult doing work/chores Not difficult at all        Quality of Life:   Personal Goals: Goals established at orientation with interventions provided to work toward goal.  Personal Goals and Risk Factors at Admission - 04/14/23 1052       Core Components/Risk Factors/Patient Goals on Admission   Improve shortness of breath with ADL's Yes    Intervention Provide education, individualized exercise plan and daily activity instruction to help decrease symptoms of SOB with activities of daily living.    Expected Outcomes Short Term: Improve cardiorespiratory  fitness to achieve a reduction of symptoms when performing ADLs;Long Term: Be able to perform more ADLs without symptoms or delay the onset of symptoms              Personal Goals Discharge:  Goals and Risk Factor Review     Row Name 05/05/23 1133 06/07/23 0853           Core Components/Risk Factors/Patient Goals Review   Personal Goals Review Improve shortness of breath with ADL's;Develop more efficient breathing techniques such as purse lipped breathing and diaphragmatic breathing and practicing self-pacing with activity. Improve shortness of breath with ADL's;Develop more efficient breathing techniques such as purse lipped breathing and diaphragmatic breathing and practicing self-pacing with activity.      Review Shion has attended 4 sessions so far. Goal progressing for improving shortness of breath with ADL's. Goal progressing for developing more efficient breathing techniques such as purse lipped breathing and diaphragmatic breathing; and practicing self-pacing with activity.  Daichi is currently requiring 2-4 L while exercising to keep sats >88%. He is currently exercising on the Treadmill and the bike. We will continue to monitor her progress throughout the program. Kaspar was discharged from the program on 06/03/23 due to an upcoming surgery on 06/08/23. He did meet his goal for developing more efficient breathing techniques such as purse lipped breathing and diaphragmatic breathing; and practicing self-pacing with activity. He was able to demonstrate purse lip breathing when he was SOB. He also knew how to self pace himself based on his rate of perceived exertion scale. Goal was not met for improving his shortness of breath with ADL's.      Expected Outcomes To improve shortness of breath with ADL's and develop more efficient breathing techniques such as purse lipped breathing and diaphragmatic breathing; and practicing self-pacing with activity. Patient will continue to benefit from  ongoing nutrition, exercise, and lifestyle modification               Exercise Goals and Review:  Exercise Goals     Row Name 04/14/23 1054             Exercise Goals   Increase Physical Activity Yes  Intervention Provide advice, education, support and counseling about physical activity/exercise needs.;Develop an individualized exercise prescription for aerobic and resistive training based on initial evaluation findings, risk stratification, comorbidities and participant's personal goals.       Expected Outcomes Short Term: Attend rehab on a regular basis to increase amount of physical activity.;Long Term: Add in home exercise to make exercise part of routine and to increase amount of physical activity.;Long Term: Exercising regularly at least 3-5 days a week.       Increase Strength and Stamina Yes       Intervention Provide advice, education, support and counseling about physical activity/exercise needs.;Develop an individualized exercise prescription for aerobic and resistive training based on initial evaluation findings, risk stratification, comorbidities and participant's personal goals.       Expected Outcomes Short Term: Increase workloads from initial exercise prescription for resistance, speed, and METs.;Short Term: Perform resistance training exercises routinely during rehab and add in resistance training at home;Long Term: Improve cardiorespiratory fitness, muscular endurance and strength as measured by increased METs and functional capacity ( )       Able to understand and use rate of perceived exertion (RPE) scale Yes       Intervention Provide education and explanation on how to use RPE scale       Expected Outcomes Short Term: Able to use RPE daily in rehab to express subjective intensity level;Long Term:  Able to use RPE to guide intensity level when exercising independently       Able to understand and use Dyspnea scale Yes       Intervention Provide education and  explanation on how to use Dyspnea scale       Expected Outcomes Short Term: Able to use Dyspnea scale daily in rehab to express subjective sense of shortness of breath during exertion;Long Term: Able to use Dyspnea scale to guide intensity level when exercising independently       Knowledge and understanding of Target Heart Rate Range (THRR) Yes       Intervention Provide education and explanation of THRR including how the numbers were predicted and where they are located for reference       Expected Outcomes Short Term: Able to state/look up THRR;Long Term: Able to use THRR to govern intensity when exercising independently;Short Term: Able to use daily as guideline for intensity in rehab       Understanding of Exercise Prescription Yes       Intervention Provide education, explanation, and written materials on patient's individual exercise prescription       Expected Outcomes Short Term: Able to explain program exercise prescription;Long Term: Able to explain home exercise prescription to exercise independently                Exercise Goals Re-Evaluation:  Exercise Goals Re-Evaluation     Row Name 05/07/23 1328 06/03/23 1532           Exercise Goal Re-Evaluation   Exercise Goals Review Increase Physical Activity;Able to understand and use Dyspnea scale;Understanding of Exercise Prescription;Increase Strength and Stamina;Knowledge and understanding of Target Heart Rate Range (THRR);Able to understand and use rate of perceived exertion (RPE) scale Increase Physical Activity;Able to understand and use Dyspnea scale;Understanding of Exercise Prescription;Increase Strength and Stamina;Knowledge and understanding of Target Heart Rate Range (THRR);Able to understand and use rate of perceived exertion (RPE) scale      Comments Baby has completed 5 exercise sessions. He exercises for 15 min on the treadmill and upright bike. Molly Maduro  averages 2.5 METs at 2.1 mph on the treadmill and 2.9 METs at  level 4 on the upright bike. Mckade performs the warmup and cooldown standing without limitations. Mingo has increased his workload for both exercise modes as METs have increased. He tolerates progressions well and seems motivated to exercise. I discussed METs on his first day, although I am unsure if he understands METs yet. Will reiterate METs again. Will continue to monitor and progress as able. Chaddrick has completed 11 exercise sessions. Peak METs were 3.1 on the upright bike and 2.8 on the treadmill. Pt is being discharged for cardiac procedure and will continue in Cardiac Rehab.      Expected Outcomes Through exercise at rehab and home, the patient will decrease shortness of breath and feel confident in carrying out an exercise regimen at home. Through exercise at rehab and home, the patient will decrease shortness of breath and feel confident in carrying out an exercise regimen at home.               Nutrition & Weight - Outcomes:  Pre Biometrics - 04/14/23 1145       Pre Biometrics   Grip Strength 32 kg              Nutrition:   Nutrition Discharge:   Education Questionnaire Score:  Knowledge Questionnaire Score - 04/14/23 1149       Knowledge Questionnaire Score   Pre Score 11/18             Goals reviewed with patient; copy given to patient.

## 2023-06-08 ENCOUNTER — Inpatient Hospital Stay (HOSPITAL_COMMUNITY)
Admission: RE | Admit: 2023-06-08 | Discharge: 2023-06-09 | DRG: 266 | Disposition: A | Discharge status: Home / Self Care | Attending: Cardiovascular Disease | Admitting: Cardiovascular Disease

## 2023-06-08 ENCOUNTER — Inpatient Hospital Stay (HOSPITAL_COMMUNITY): Payer: Self-pay | Admitting: Physician Assistant

## 2023-06-08 ENCOUNTER — Inpatient Hospital Stay (HOSPITAL_COMMUNITY)

## 2023-06-08 ENCOUNTER — Inpatient Hospital Stay (HOSPITAL_COMMUNITY): Payer: Self-pay | Admitting: Certified Registered Nurse Anesthetist

## 2023-06-08 ENCOUNTER — Encounter (HOSPITAL_COMMUNITY): Payer: Medicare Other

## 2023-06-08 ENCOUNTER — Inpatient Hospital Stay (HOSPITAL_COMMUNITY)
Admission: RE | Disposition: A | Discharge status: Home / Self Care | Payer: Self-pay | Source: Home / Self Care | Attending: Cardiovascular Disease

## 2023-06-08 ENCOUNTER — Other Ambulatory Visit: Payer: Self-pay | Admitting: Physician Assistant

## 2023-06-08 ENCOUNTER — Encounter (HOSPITAL_COMMUNITY): Payer: Self-pay | Admitting: Cardiovascular Disease

## 2023-06-08 ENCOUNTER — Other Ambulatory Visit: Payer: Self-pay

## 2023-06-08 DIAGNOSIS — N400 Enlarged prostate without lower urinary tract symptoms: Secondary | ICD-10-CM | POA: Diagnosis present

## 2023-06-08 DIAGNOSIS — I8510 Secondary esophageal varices without bleeding: Secondary | ICD-10-CM | POA: Diagnosis present

## 2023-06-08 DIAGNOSIS — R0600 Dyspnea, unspecified: Secondary | ICD-10-CM | POA: Diagnosis not present

## 2023-06-08 DIAGNOSIS — Z8249 Family history of ischemic heart disease and other diseases of the circulatory system: Secondary | ICD-10-CM | POA: Diagnosis not present

## 2023-06-08 DIAGNOSIS — E782 Mixed hyperlipidemia: Secondary | ICD-10-CM | POA: Diagnosis present

## 2023-06-08 DIAGNOSIS — K7460 Unspecified cirrhosis of liver: Secondary | ICD-10-CM | POA: Diagnosis present

## 2023-06-08 DIAGNOSIS — I452 Bifascicular block: Secondary | ICD-10-CM | POA: Diagnosis not present

## 2023-06-08 DIAGNOSIS — Z555 Less than a high school diploma: Secondary | ICD-10-CM | POA: Diagnosis not present

## 2023-06-08 DIAGNOSIS — Z860101 Personal history of adenomatous and serrated colon polyps: Secondary | ICD-10-CM

## 2023-06-08 DIAGNOSIS — G4733 Obstructive sleep apnea (adult) (pediatric): Secondary | ICD-10-CM | POA: Diagnosis present

## 2023-06-08 DIAGNOSIS — I5033 Acute on chronic diastolic (congestive) heart failure: Secondary | ICD-10-CM | POA: Insufficient documentation

## 2023-06-08 DIAGNOSIS — B20 Human immunodeficiency virus [HIV] disease: Secondary | ICD-10-CM | POA: Diagnosis present

## 2023-06-08 DIAGNOSIS — Z79899 Other long term (current) drug therapy: Secondary | ICD-10-CM

## 2023-06-08 DIAGNOSIS — Z87442 Personal history of urinary calculi: Secondary | ICD-10-CM

## 2023-06-08 DIAGNOSIS — Z952 Presence of prosthetic heart valve: Secondary | ICD-10-CM | POA: Diagnosis not present

## 2023-06-08 DIAGNOSIS — Z006 Encounter for examination for normal comparison and control in clinical research program: Secondary | ICD-10-CM | POA: Diagnosis not present

## 2023-06-08 DIAGNOSIS — D696 Thrombocytopenia, unspecified: Secondary | ICD-10-CM | POA: Diagnosis present

## 2023-06-08 DIAGNOSIS — I110 Hypertensive heart disease with heart failure: Secondary | ICD-10-CM | POA: Diagnosis not present

## 2023-06-08 DIAGNOSIS — Z87891 Personal history of nicotine dependence: Secondary | ICD-10-CM

## 2023-06-08 DIAGNOSIS — Z880 Allergy status to penicillin: Secondary | ICD-10-CM

## 2023-06-08 DIAGNOSIS — J8410 Pulmonary fibrosis, unspecified: Secondary | ICD-10-CM | POA: Diagnosis not present

## 2023-06-08 DIAGNOSIS — I350 Nonrheumatic aortic (valve) stenosis: Principal | ICD-10-CM

## 2023-06-08 DIAGNOSIS — I25118 Atherosclerotic heart disease of native coronary artery with other forms of angina pectoris: Secondary | ICD-10-CM | POA: Diagnosis present

## 2023-06-08 DIAGNOSIS — I2510 Atherosclerotic heart disease of native coronary artery without angina pectoris: Secondary | ICD-10-CM | POA: Diagnosis present

## 2023-06-08 DIAGNOSIS — I4510 Unspecified right bundle-branch block: Secondary | ICD-10-CM | POA: Diagnosis present

## 2023-06-08 DIAGNOSIS — I10 Essential (primary) hypertension: Secondary | ICD-10-CM | POA: Diagnosis present

## 2023-06-08 DIAGNOSIS — Z8619 Personal history of other infectious and parasitic diseases: Secondary | ICD-10-CM | POA: Diagnosis not present

## 2023-06-08 DIAGNOSIS — M545 Low back pain, unspecified: Secondary | ICD-10-CM | POA: Diagnosis present

## 2023-06-08 DIAGNOSIS — Z79891 Long term (current) use of opiate analgesic: Secondary | ICD-10-CM

## 2023-06-08 DIAGNOSIS — J849 Interstitial pulmonary disease, unspecified: Secondary | ICD-10-CM | POA: Diagnosis present

## 2023-06-08 DIAGNOSIS — Z7984 Long term (current) use of oral hypoglycemic drugs: Secondary | ICD-10-CM

## 2023-06-08 DIAGNOSIS — Z7902 Long term (current) use of antithrombotics/antiplatelets: Secondary | ICD-10-CM

## 2023-06-08 DIAGNOSIS — E118 Type 2 diabetes mellitus with unspecified complications: Secondary | ICD-10-CM | POA: Diagnosis not present

## 2023-06-08 DIAGNOSIS — G8929 Other chronic pain: Secondary | ICD-10-CM | POA: Diagnosis present

## 2023-06-08 DIAGNOSIS — Z888 Allergy status to other drugs, medicaments and biological substances status: Secondary | ICD-10-CM

## 2023-06-08 DIAGNOSIS — Z955 Presence of coronary angioplasty implant and graft: Secondary | ICD-10-CM

## 2023-06-08 DIAGNOSIS — I509 Heart failure, unspecified: Secondary | ICD-10-CM | POA: Diagnosis not present

## 2023-06-08 DIAGNOSIS — B1920 Unspecified viral hepatitis C without hepatic coma: Secondary | ICD-10-CM

## 2023-06-08 HISTORY — PX: INTRAOPERATIVE TRANSTHORACIC ECHOCARDIOGRAM: SHX6523

## 2023-06-08 HISTORY — DX: Presence of prosthetic heart valve: Z95.2

## 2023-06-08 LAB — POCT I-STAT, CHEM 8
BUN: 11 mg/dL (ref 8–23)
BUN: 11 mg/dL (ref 8–23)
Calcium, Ion: 1.18 mmol/L (ref 1.15–1.40)
Calcium, Ion: 1.12 mmol/L — ABNORMAL LOW (ref 1.15–1.40)
Chloride: 105 mmol/L (ref 98–111)
Chloride: 105 mmol/L (ref 98–111)
Creatinine, Ser: 0.4 mg/dL — ABNORMAL LOW (ref 0.61–1.24)
Creatinine, Ser: 0.4 mg/dL — ABNORMAL LOW (ref 0.61–1.24)
Glucose, Bld: 143 mg/dL — ABNORMAL HIGH (ref 70–99)
Glucose, Bld: 138 mg/dL — ABNORMAL HIGH (ref 70–99)
HCT: 38 % — ABNORMAL LOW (ref 39.0–52.0)
HCT: 38 % — ABNORMAL LOW (ref 39.0–52.0)
Hemoglobin: 12.9 g/dL — ABNORMAL LOW (ref 13.0–17.0)
Hemoglobin: 12.9 g/dL — ABNORMAL LOW (ref 13.0–17.0)
Potassium: 3.1 mmol/L — ABNORMAL LOW (ref 3.5–5.1)
Potassium: 3.4 mmol/L — ABNORMAL LOW (ref 3.5–5.1)
Sodium: 142 mmol/L (ref 135–145)
Sodium: 141 mmol/L (ref 135–145)
TCO2: 24 mmol/L (ref 22–32)
TCO2: 26 mmol/L (ref 22–32)

## 2023-06-08 LAB — ECHOCARDIOGRAM LIMITED
AR max vel: 1.95 cm2
AV Area VTI: 2.09 cm2
AV Area mean vel: 1.95 cm2
AV Mean grad: 4 mmHg
AV Peak grad: 8 mmHg
Ao pk vel: 1.41 m/s
P 1/2 time: 480 ms

## 2023-06-08 LAB — ABO/RH: ABO/RH(D): O POS

## 2023-06-08 LAB — GLUCOSE, CAPILLARY
Glucose-Capillary: 136 mg/dL — ABNORMAL HIGH (ref 70–99)
Glucose-Capillary: 129 mg/dL — ABNORMAL HIGH (ref 70–99)
Glucose-Capillary: 268 mg/dL — ABNORMAL HIGH (ref 70–99)

## 2023-06-08 SURGERY — TRANSCATHETER AORTIC VALVE REPLACEMENT, TRANSFEMORAL (CATHLAB)
Anesthesia: Monitor Anesthesia Care

## 2023-06-08 MED ORDER — CHLORHEXIDINE GLUCONATE 0.12 % MT SOLN: 15.0000 mL | Freq: Once | OROMUCOSAL | Status: AC

## 2023-06-08 MED ORDER — PROTAMINE SULFATE 10 MG/ML IV SOLN
INTRAVENOUS | Status: DC | PRN
  Administered 2023-06-08: 130 mg via INTRAVENOUS

## 2023-06-08 MED ORDER — HEPARIN SODIUM (PORCINE) 1000 UNIT/ML IJ SOLN
INTRAMUSCULAR | Status: DC | PRN
Start: 2023-06-08 — End: 2023-06-08
  Administered 2023-06-08: 13000 [IU] via INTRAVENOUS

## 2023-06-08 MED ORDER — SODIUM CHLORIDE 0.9 % IV SOLN: INTRAVENOUS | Status: DC

## 2023-06-08 MED ORDER — CHLORHEXIDINE GLUCONATE 4 % EX SOLN: 30.0000 mL | CUTANEOUS | Status: DC

## 2023-06-08 MED ORDER — LACTATED RINGERS IV SOLN: INTRAVENOUS | Status: DC

## 2023-06-08 MED ORDER — SODIUM CHLORIDE 0.9 % IV SOLN
INTRAVENOUS | Status: AC
Start: 2023-06-08 — End: 2023-06-08

## 2023-06-08 MED ORDER — INSULIN ASPART 100 UNIT/ML IJ SOLN: 0.0000 [IU] | INTRAMUSCULAR | Status: DC | PRN

## 2023-06-08 MED ORDER — INSULIN ASPART 100 UNIT/ML IJ SOLN: 0.0000 [IU] | Freq: Three times a day (TID) | INTRAMUSCULAR | Status: DC

## 2023-06-08 MED ORDER — SODIUM CHLORIDE 0.9% FLUSH
3.0000 mL | Freq: Two times a day (BID) | INTRAVENOUS | Status: DC
  Administered 2023-06-09: 3 mL via INTRAVENOUS

## 2023-06-08 MED ORDER — SODIUM CHLORIDE 0.9% FLUSH: 3.0000 mL | INTRAVENOUS | Status: DC | PRN

## 2023-06-08 MED ORDER — PROPOFOL 10 MG/ML IV BOLUS
INTRAVENOUS | Status: DC | PRN
  Administered 2023-06-08: 20 ug via INTRAVENOUS

## 2023-06-08 MED ORDER — ONDANSETRON HCL 4 MG/2ML IJ SOLN
INTRAMUSCULAR | Status: DC | PRN
  Administered 2023-06-08: 4 mg via INTRAVENOUS

## 2023-06-08 MED ORDER — HEPARIN (PORCINE) IN NACL 1000-0.9 UT/500ML-% IV SOLN
INTRAVENOUS | Status: DC | PRN
  Administered 2023-06-08 (×2): 500 mL

## 2023-06-08 MED ORDER — LIDOCAINE HCL (PF) 1 % IJ SOLN
INTRAMUSCULAR | Status: DC | PRN
  Administered 2023-06-08: 5 mL
  Administered 2023-06-08 (×2): 10 mL

## 2023-06-08 MED ORDER — ACETAMINOPHEN 650 MG RE SUPP: 650.0000 mg | Freq: Four times a day (QID) | RECTAL | Status: DC | PRN

## 2023-06-08 MED ORDER — POTASSIUM CHLORIDE CRYS ER 20 MEQ PO TBCR
40.0000 meq | EXTENDED_RELEASE_TABLET | Freq: Once | ORAL | Status: AC
  Administered 2023-06-08: 40 meq via ORAL
  Filled 2023-06-08: qty 2

## 2023-06-08 MED ORDER — SODIUM CHLORIDE 0.9 % IV SOLN: 250.0000 mL | INTRAVENOUS | Status: DC | PRN

## 2023-06-08 MED ORDER — BICTEGRAVIR-EMTRICITAB-TENOFOV 50-200-25 MG PO TABS
1.0000 | ORAL_TABLET | Freq: Every day | ORAL | Status: DC
  Administered 2023-06-08: 1 via ORAL
  Filled 2023-06-08 (×2): qty 1

## 2023-06-08 MED ORDER — SODIUM CHLORIDE 0.9 % IV SOLN: INTRAVENOUS | Status: DC | PRN

## 2023-06-08 MED ORDER — CHLORHEXIDINE GLUCONATE 0.12 % MT SOLN
OROMUCOSAL | Status: AC
Start: 2023-06-08 — End: 2023-06-08
  Administered 2023-06-08: 15 mL via OROMUCOSAL
  Filled 2023-06-08: qty 15

## 2023-06-08 MED ORDER — OXYCODONE HCL 5 MG PO TABS
10.0000 mg | ORAL_TABLET | Freq: Three times a day (TID) | ORAL | Status: DC | PRN
  Administered 2023-06-08 – 2023-06-09 (×2): 10 mg via ORAL
  Filled 2023-06-08 (×2): qty 2

## 2023-06-08 MED ORDER — CEFAZOLIN SODIUM-DEXTROSE 2-4 GM/100ML-% IV SOLN
2.0000 g | Freq: Three times a day (TID) | INTRAVENOUS | Status: AC
  Administered 2023-06-08 – 2023-06-09 (×2): 2 g via INTRAVENOUS
  Filled 2023-06-08 (×2): qty 100

## 2023-06-08 MED ORDER — ICOSAPENT ETHYL 1 G PO CAPS
2.0000 g | ORAL_CAPSULE | Freq: Two times a day (BID) | ORAL | Status: DC
  Administered 2023-06-08 – 2023-06-09 (×2): 2 g via ORAL
  Filled 2023-06-08 (×2): qty 2

## 2023-06-08 MED ORDER — ROSUVASTATIN CALCIUM 5 MG PO TABS
10.0000 mg | ORAL_TABLET | Freq: Every day | ORAL | Status: DC
  Administered 2023-06-08 – 2023-06-09 (×2): 10 mg via ORAL
  Filled 2023-06-08 (×2): qty 2

## 2023-06-08 MED ORDER — IODIXANOL 320 MG/ML IV SOLN
INTRAVENOUS | Status: DC | PRN
  Administered 2023-06-08: 70 mL

## 2023-06-08 MED ORDER — CLOPIDOGREL BISULFATE 75 MG PO TABS
75.0000 mg | ORAL_TABLET | Freq: Every day | ORAL | Status: DC
  Administered 2023-06-08 – 2023-06-09 (×2): 75 mg via ORAL
  Filled 2023-06-08 (×2): qty 1

## 2023-06-08 MED ORDER — CHLORHEXIDINE GLUCONATE 4 % EX SOLN: 60.0000 mL | Freq: Once | CUTANEOUS | Status: DC

## 2023-06-08 MED ORDER — HYDROCODONE BIT-HOMATROP MBR 5-1.5 MG/5ML PO SOLN
5.0000 mL | Freq: Four times a day (QID) | ORAL | Status: DC | PRN
Start: 2023-06-08 — End: 2023-06-09
  Administered 2023-06-08: 5 mL via ORAL
  Filled 2023-06-08: qty 5

## 2023-06-08 MED ORDER — PROPOFOL 500 MG/50ML IV EMUL
INTRAVENOUS | Status: DC | PRN
  Administered 2023-06-08: 25 ug/kg/min via INTRAVENOUS

## 2023-06-08 MED ORDER — MORPHINE SULFATE (PF) 2 MG/ML IV SOLN: 1.0000 mg | INTRAVENOUS | Status: DC | PRN

## 2023-06-08 MED ORDER — ONDANSETRON HCL 4 MG/2ML IJ SOLN: 4.0000 mg | Freq: Four times a day (QID) | INTRAMUSCULAR | Status: DC | PRN

## 2023-06-08 MED ORDER — NITROGLYCERIN IN D5W 200-5 MCG/ML-% IV SOLN
0.0000 ug/min | INTRAVENOUS | Status: DC
Start: 2023-06-08 — End: 2023-06-09

## 2023-06-08 MED ORDER — ACETAMINOPHEN 325 MG PO TABS
650.0000 mg | ORAL_TABLET | Freq: Four times a day (QID) | ORAL | Status: DC | PRN
  Administered 2023-06-09: 650 mg via ORAL
  Filled 2023-06-08: qty 2

## 2023-06-08 SURGICAL SUPPLY — 28 items
BAG SNAP BAND KOVER 36X36 (MISCELLANEOUS) ×2 IMPLANT
CABLE ADAPT PACING TEMP 12FT (ADAPTER) IMPLANT
CATH 26 ULTRA DELIVERY (CATHETERS) IMPLANT
CATH DIAG 6FR PIGTAIL ANGLED (CATHETERS) IMPLANT
CATH INFINITI 5FR ANG PIGTAIL (CATHETERS) IMPLANT
CATH INFINITI 6F AL2 (CATHETERS) IMPLANT
CATH INFINITI JR4 5F (CATHETERS) IMPLANT
CLOSURE MYNX CONTROL 6F/7F (Vascular Products) IMPLANT
CLOSURE PERCLOSE PROSTYLE (VASCULAR PRODUCTS) IMPLANT
CRIMPER (MISCELLANEOUS) IMPLANT
DEVICE INFLATION ATRION QL2530 (MISCELLANEOUS) IMPLANT
GUIDEWIRE SAFE TJ AMPLATZ EXST (WIRE) IMPLANT
KIT MICROPUNCTURE NIT STIFF (SHEATH) IMPLANT
KIT SAPIAN 3 ULTRA RESILIA 26 (Valve) IMPLANT
KIT SINGLE USE MANIFOLD (KITS) IMPLANT
PACK CARDIAC CATHETERIZATION (CUSTOM PROCEDURE TRAY) ×1 IMPLANT
SET ATX-X65L (MISCELLANEOUS) IMPLANT
SHEATH INTRODUCER SET 20-26 (SHEATH) IMPLANT
SHEATH PINNACLE 6F 10CM (SHEATH) IMPLANT
SHEATH PINNACLE 8F 10CM (SHEATH) IMPLANT
SHEATH PROBE COVER 6X72 (BAG) IMPLANT
SHIELD CATH-GARD CONTAMINATION (MISCELLANEOUS) IMPLANT
STOPCOCK MORSE 400PSI 3WAY (MISCELLANEOUS) ×2 IMPLANT
WIRE AMPLATZ SS-J .035X180CM (WIRE) IMPLANT
WIRE EMERALD 3MM-J .035X150CM (WIRE) IMPLANT
WIRE EMERALD 3MM-J .035X260CM (WIRE) IMPLANT
WIRE EMERALD ST .035X260CM (WIRE) IMPLANT
WIRE PACING TEMP ST TIP 5 (CATHETERS) IMPLANT

## 2023-06-08 NOTE — Progress Notes (Signed)
 K. Janee Morn, [place], made aware of K+ level of 3.4, will supplement

## 2023-06-08 NOTE — Progress Notes (Addendum)
 Patient received from Cathlab, vital signs obtained, CCMD notified, CHG bath given, orientation given about the unit, call bell in reach, Both femoral site is level 0, no any signs of hematoma,   06/08/23 1721  Vitals  BP 109/78  MAP (mmHg) 89  BP Location Left Arm  BP Method Automatic  Patient Position (if appropriate) Lying  Pulse Rate (!) 59  Pulse Rate Source Monitor  ECG Heart Rate 60  Resp 20  Level of Consciousness  Level of Consciousness Alert  MEWS COLOR  MEWS Score Color Green  Oxygen Therapy  SpO2 92 %  O2 Device Room Air  Pain Assessment  Pain Scale 0-10  Pain Score 0  MEWS Score  MEWS Temp 1  MEWS Systolic 0  MEWS Pulse 0  MEWS RR 0  MEWS LOC 0  MEWS Score 1

## 2023-06-08 NOTE — Interval H&P Note (Signed)
 History and Physical Interval Note:  06/08/2023 11:14 AM  Tyler Hendricks  has presented today for surgery, with the diagnosis of Severe Aortic Stenosis.  The various methods of treatment have been discussed with the patient and family. After consideration of risks, benefits and other options for treatment, the patient has consented to  Procedure(s): Transcatheter Aortic Valve Replacement, Transfemoral (N/A) ECHOCARDIOGRAM, TRANSTHORACIC (N/A) as a surgical intervention.  The patient's history has been reviewed, patient examined, no change in status, stable for surgery.  I have reviewed the patient's chart and labs.  Questions were answered to the patient's satisfaction.     Eugenio Hoes

## 2023-06-08 NOTE — Transfer of Care (Signed)
 Immediate Anesthesia Transfer of Care Note  Patient: Tyler Hendricks  Procedure(s) Performed: Transcatheter Aortic Valve Replacement, Transfemoral ECHOCARDIOGRAM, TRANSTHORACIC  Patient Location: PACU and Cath Lab  Anesthesia Type:MAC  Level of Consciousness: awake and alert   Airway & Oxygen Therapy: Patient Spontanous Breathing and Patient connected to face mask oxygen  Post-op Assessment: Report given to RN and Post -op Vital signs reviewed and stable  Post vital signs: Reviewed and stable  Last Vitals:  Vitals Value Taken Time  BP    Temp    Pulse 76 06/08/23 1452  Resp 22 06/08/23 1452  SpO2 97 % 06/08/23 1452  Vitals shown include unfiled device data.  Last Pain:  Vitals:   06/08/23 1140  TempSrc:   PainSc: 0-No pain         Complications: There were no known notable events for this encounter.

## 2023-06-08 NOTE — Progress Notes (Signed)
  Echocardiogram 2D Echocardiogram has been performed.  Tyler Hendricks 06/08/2023, 2:43 PM

## 2023-06-08 NOTE — Discharge Instructions (Signed)

## 2023-06-08 NOTE — CV Procedure (Addendum)
 [facility]ASC[facility]  TAVR OPERATIVE NOTE   Date of Procedure:  06/08/2023  Preoperative Diagnosis: Severe Aortic Stenosis   Postoperative Diagnosis: Same   Procedure:   Transcatheter Aortic Valve Replacement - Transfemoral Approach  Edwards Sapien 3 THV (size 26 mm, model # [id number], serial # [id number])   Co-Surgeons:  Verne Carrow, MD and Eugenio Hoes , MD   Anesthesiologist:  Maple Hudson  Echocardiographer:  Croitoru  Pre-operative Echo Findings: Severe aortic stenosis Normal left ventricular systolic function  Post-operative Echo Findings: No paravalvular leak Normal left ventricular systolic function  BRIEF CLINICAL NOTE AND INDICATIONS FOR SURGERY  73 yo male with history of HIV, HTN, HLD, cirrhosis, Hepatitis B, pulmonary fibrosis, CAD, DM, carotid artery disease and aortic stenosis. He had been followed in our office by Dr. Eldridge Dace since 2019 after moving to [facility] from [place]. He has CAD and has had prior stenting of the LAD, obtuse marginal branch, left posterolateral branch and diagonal branch, all done in [place]. He has had HIV since 1989 with undetectable viral load. His pulmonary fibrosis is followed in the pulmonary clinic. He has sleep apnea but does not use CPAP. He has cirrhosis and mild esophageal varices.   He has been followed for moderate aortic stenosis since 2018. Echo January 2025 with LVEF=55-60%, grade 1 diastolic dysfunction. Heavily calcified aortic valve with likely paradoxical low flow/low gradient severe aortic stenosis with mean gradient of 31 mmHg, AVA 0.76 cm2, DI 0.22, SVI 27. He has progressive dyspnea.   During the course of the patient's preoperative work up they have been evaluated comprehensively by a multidisciplinary team of specialists coordinated through the Multidisciplinary Heart Valve Clinic in the [facility].  They have been demonstrated to suffer from symptomatic severe aortic stenosis as noted above. The  patient has been counseled extensively as to the relative risks and benefits of all options for the treatment of severe aortic stenosis including long term medical therapy, conventional surgery for aortic valve replacement, and transcatheter aortic valve replacement.  The patient has been independently evaluated by Dr. Leafy Ro with CT surgery and they are felt to be at high risk for conventional surgical aortic valve replacement. The surgeon indicated the patient would be a poor candidate for conventional surgery. Based upon review of all of the patient's preoperative diagnostic tests they are felt to be candidate for transcatheter aortic valve replacement using the transfemoral approach as an alternative to high risk conventional surgery.    Following the decision to proceed with transcatheter aortic valve replacement, a discussion has been held regarding what types of management strategies would be attempted intraoperatively in the event of life-threatening complications, including whether or not the patient would be considered a candidate for the use of cardiopulmonary bypass and/or conversion to open sternotomy for attempted surgical intervention.  The patient has been advised of a variety of complications that might develop peculiar to this approach including but not limited to risks of death, stroke, paravalvular leak, aortic dissection or other major vascular complications, aortic annulus rupture, device embolization, cardiac rupture or perforation, acute myocardial infarction, arrhythmia, heart block or bradycardia requiring permanent pacemaker placement, congestive heart failure, respiratory failure, renal failure, pneumonia, infection, other late complications related to structural valve deterioration or migration, or other complications that might ultimately cause a temporary or permanent loss of functional independence or other long term morbidity.  The patient provides full informed consent for the  procedure as described and all questions were answered preoperatively.  DETAILS OF THE OPERATIVE PROCEDURE  PREPARATION:   The patient is brought to the operating room on the above mentioned date and central monitoring was established by the anesthesia team including placement of a radial arterial line. The patient is placed in the supine position on the operating table.  Intravenous antibiotics are administered. Conscious sedation is used.   Baseline transthoracic echocardiogram was performed. The patient's chest, abdomen, both groins, and both lower extremities are prepared and draped in a sterile manner. A time out procedure is performed.   PERIPHERAL ACCESS:   Using the modified Seldinger technique, femoral arterial and venous access were obtained with placement of a 6 Fr sheath in the artery and a 7 Fr sheath in the vein on the left side using u/s guidance.  A pigtail diagnostic catheter was passed through the femoral arterial sheath under fluoroscopic guidance into the aortic root.  A temporary transvenous pacemaker catheter was passed through the femoral venous sheath under fluoroscopic guidance into the right ventricle.  The pacemaker was tested to ensure stable lead placement and pacemaker capture. Aortic root angiography was performed in order to determine the optimal angiographic angle for valve deployment.  TRANSFEMORAL ACCESS:  A micropuncture kit was used to gain access to the right femoral artery using u/s guidance. Position confirmed with angiography. Pre-closure with double ProGlide closure devices. The patient was heparinized systemically and ACT verified > 250 seconds.    A 14 Fr transfemoral E-sheath was introduced into the right femoral artery after progressively dilating over an Amplatz superstiff wire. An AL-2 catheter was used to direct a straight-tip exchange length wire across the native aortic valve into the left ventricle. This was exchanged out for a pigtail catheter  and position was confirmed in the LV apex. Simultaneous LV and Ao pressures were recorded.  The pigtail catheter was then exchanged for a Safari wire in the LV apex.   TRANSCATHETER HEART VALVE DEPLOYMENT:  An Edwards Sapien 3 THV (size 26 mm) was prepared and crimped per manufacturer's guidelines, and the proper orientation of the valve is confirmed on the [employer] delivery system. The valve was advanced through the introducer sheath using normal technique until in an appropriate position in the abdominal aorta beyond the sheath tip. The balloon was then retracted and using the fine-tuning wheel was centered on the valve. The valve was then advanced across the aortic arch using appropriate flexion of the catheter. The valve was carefully positioned across the aortic valve annulus. The Commander catheter was retracted using normal technique. Once final position of the valve has been confirmed by angiographic assessment, the valve is deployed while temporarily holding ventilation and during rapid ventricular pacing to maintain systolic blood pressure < 50 mmHg and pulse pressure < 10 mmHg. The balloon inflation is held for >3 seconds after reaching full deployment volume. Once the balloon has fully deflated the balloon is retracted into the ascending aorta and valve function is assessed using TTE. There is felt to be no paravalvular leak and no central aortic insufficiency.  The patient's hemodynamic recovery following valve deployment is good.  The deployment balloon and guidewire are both removed. Echo demostrated acceptable post-procedural gradients, stable mitral valve function, and no AI.   PROCEDURE COMPLETION:  The sheath was then removed and closure devices were completed. Protamine was administered once femoral arterial repair was complete. The temporary pacemaker, pigtail catheters and femoral sheaths were removed with a Mynx closure device placed in the artery and manual pressure used for  venous hemostasis.    The patient tolerated the procedure well and is transported to the surgical intensive care in stable condition. There were no immediate intraoperative complications. All sponge instrument and needle counts are verified correct at completion of the operation.   No blood products were administered during the operation.  The patient received a total of 70 mL of intravenous contrast during the procedure.  LVEDP: 28 mmHg  Verne Carrow MD, [facility] 06/08/2023 2:48 PM

## 2023-06-08 NOTE — Anesthesia Preprocedure Evaluation (Signed)
 Anesthesia Evaluation  Patient identified by MRN, date of birth, ID band Patient awake    Reviewed: Allergy & Precautions, NPO status , Patient's Chart, lab work & pertinent test results  History of Anesthesia Complications Negative for: history of anesthetic complications  Airway Mallampati: II  TM Distance: >3 FB Neck ROM: Full    Dental  (+) Dental Advisory Given   Pulmonary asthma , sleep apnea , former smoker   breath sounds clear to auscultation       Cardiovascular hypertension, (-) angina + CAD and +CHF  + Valvular Problems/Murmurs AS  Rhythm:Regular + Systolic murmurs    Neuro/Psych negative neurological ROS  negative psych ROS   GI/Hepatic ,,,(+) Hepatitis -, C  Endo/Other  diabetes    Renal/GU Renal disease     Musculoskeletal  (+) Arthritis ,    Abdominal   Peds  Hematology Lab Results      Component                Value               Date                      WBC                      4.3                 06/04/2023                HGB                      16.2                06/04/2023                HCT                      47.3                06/04/2023                MCV                      100.4 (H)           06/04/2023                PLT                      88 (L)              06/04/2023              Anesthesia Other Findings   Reproductive/Obstetrics                             Anesthesia Physical Anesthesia Plan  ASA: 4  Anesthesia Plan: MAC   Post-op Pain Management: Minimal or no pain anticipated   Induction: Intravenous  PONV Risk Score and Plan: 1 and Propofol infusion and Treatment may vary due to age or medical condition  Airway Management Planned: Natural Airway, Nasal Cannula and Simple Face Mask  Additional Equipment: Arterial line  Intra-op Plan:   Post-operative Plan:   Informed Consent: I have reviewed the patients History and Physical,  chart, labs and discussed the procedure including the risks,  benefits and alternatives for the proposed anesthesia with the patient or authorized representative who has indicated his/her understanding and acceptance.     Dental advisory given  Plan Discussed with: CRNA  Anesthesia Plan Comments:        Anesthesia Quick Evaluation

## 2023-06-08 NOTE — Progress Notes (Addendum)
  HEART AND VASCULAR CENTER   MULTIDISCIPLINARY HEART VALVE TEAM  Patient doing well s/p TAVR. He is hemodynamically stable but BPs have been soft. He is still quite drowsy from sedation. Groin sites stable. ECG with old RBBB/LAFB. There is no high grade block. K 3.4- will supplement with KDur 40mg . Plan to transfer to from cath lab holding to 4E when bed available. Early ambulation after bedrest completed and hopeful discharge over the next 24-48 hours.   Cline Crock PA-C  [facility]  Pager [phone]

## 2023-06-08 NOTE — Op Note (Signed)
 HEART AND VASCULAR CENTER   MULTIDISCIPLINARY HEART VALVE TEAM   TAVR OPERATIVE NOTE   Date of Procedure:  06/08/2023  Preoperative Diagnosis: Severe Aortic Stenosis   Postoperative Diagnosis: Same   Procedure:   Transcatheter Aortic Valve Replacement - Percutaneous right Transfemoral Approach  Edwards Sapien 3 Ultra THV (size 26 mm, model # [id number])   Co-Surgeons:  Eugenio Hoes MD and Verne Carrow    Anesthesiologist:  Dr Sol Passer  Echocardiographer:  Dr Croitoru  Pre-operative Echo Findings: Severe aortic stenosis normal left ventricular systolic function  Post-operative Echo Findings: no paravalvular leak normal left ventricular systolic function   BRIEF CLINICAL NOTE AND INDICATIONS FOR SURGERY  73 yo male with NYHA class 2 symptoms of severe AS with normal LV function with prior cardiac stenting but without need for intervention on recent cath. He has a class I indication for AVR and due to his age, pulmonary fibrosis and cirrhosis would best be served with TAVR with bailout if needed. He will need to be off plavix for a week.     DETAILS OF THE OPERATIVE PROCEDURE  PREPARATION:    The patient was brought to the operating room on the above mentioned date and appropriate monitoring was established by the anesthesia team. The patient was placed in the supine position on the operating table.  Intravenous antibiotics were administered. The patient was monitored closely throughout the procedure under conscious sedation.  Baseline transthoracic echocardiogram was performed. The patient's abdomen and both groins were prepped and draped in a sterile manner. A time out procedure was performed.   PERIPHERAL ACCESS:    Using the modified Seldinger technique, femoral arterial access was obtained with placement of 6 Fr sheaths on the left side.  Venous access was via the Right internal jugular. A pigtail diagnostic catheter was passed through the left arterial sheath  under fluoroscopic guidance into the aortic root.  A temporary transvenous pacemaker catheter was passed through the right IJ venous sheath under fluoroscopic guidance into the right ventricle.  The pacemaker was tested to ensure stable lead placement and pacemaker capture. Aortic root angiography was performed in order to determine the optimal angiographic angle for valve deployment.   TRANSFEMORAL ACCESS:   Percutaneous transfemoral access and sheath placement was performed using ultrasound guidance.  The right common femoral artery was cannulated using a micropuncture needle and appropriate location was verified using hand injection angiogram.  A pair of Abbott Perclose percutaneous closure devices were placed and a 6 French sheath replaced into the femoral artery.  The patient was heparinized systemically and ACT verified > 250 seconds.    A 14 Fr transfemoral E-sheath was introduced into the right common femoral artery after progressively dilating over an Amplatz superstiff wire. An AL1 catheter was used to direct a straight-tip exchange length wire across the native aortic valve into the left ventricle. This was exchanged out for a pigtail catheter and position was confirmed in the LV apex. Simultaneous LV and Ao pressures were recorded.  The pigtail catheter was exchanged for a Safari wire in the LV apex.   BALLOON AORTIC VALVULOPLASTY:  Was not done    TRANSCATHETER HEART VALVE DEPLOYMENT:   An Edwards Sapien 3 Ultra transcatheter heart valve (size 26 mm) was prepared and crimped per manufacturer's guidelines, and the proper orientation of the valve is confirmed on the [employer] delivery system. The valve was advanced through the introducer sheath using normal technique until in an appropriate position in the abdominal aorta beyond the  sheath tip. The balloon was then retracted and using the fine-tuning wheel was centered on the valve. The valve was then advanced across the aortic arch  using appropriate flexion of the catheter. The valve was carefully positioned across the aortic valve annulus. The Commander catheter was retracted using normal technique. Once final position of the valve has been confirmed by angiographic assessment, the valve is deployed during rapid ventricular pacing to maintain systolic blood pressure < 50 mmHg and pulse pressure < 10 mmHg. The balloon inflation is held for >3 seconds after reaching full deployment volume. Once the balloon has fully deflated the balloon is retracted into the ascending aorta and valve function is assessed using echocardiography. There is felt to be no paravalvular leak and no central aortic insufficiency.  The patient's hemodynamic recovery following valve deployment is good.  The deployment balloon and guidewire are both removed.    PROCEDURE COMPLETION:   The sheath was removed and femoral artery closure performed.  Protamine was administered once femoral arterial repair was complete. The temporary pacemaker, pigtail catheter and femoral sheaths were removed with manual pressure used for venous hemostasis.  A Mynx femoral closure device was utilized following removal of the diagnostic sheath in the left femoral artery.  The patient tolerated the procedure well and is transported to the cath lab recovery area in stable condition. There were no immediate intraoperative complications. All sponge instrument and needle counts are verified correct at completion of the operation.   No blood products were administered during the operation.  The patient received a total of 50 mL of intravenous contrast during the procedure.   Eugenio Hoes, MD 06/08/2023 2:39 PM

## 2023-06-08 NOTE — Discharge Summary (Incomplete)
 HEART AND VASCULAR CENTER   MULTIDISCIPLINARY HEART VALVE TEAM  Discharge Summary    Patient ID: Tyler Hendricks MRN: [id number]; DOB: 05-29-1950  Admit date: 06/08/2023 Discharge date: 06/09/2023  Primary Care Provider: Myrlene Broker, MD  Primary Cardiologist: Elder Negus, MD / Dr. Clifton James & Dr. Leafy Ro (TAVR)  Discharge Diagnoses    Principal Problem:   S/P TAVR (transcatheter aortic valve replacement) Active Problems:   HIV disease (HCC)   Type 2 diabetes with complication [facility])   Essential hypertension   Obstructive sleep apnea   Coronary artery disease of native artery of native heart with stable angina pectoris (HCC)   Thrombocytopenia (HCC)   Hepatic cirrhosis (HCC)   Secondary esophageal varices without bleeding (HCC)   Chronic bilateral low back pain without sciatica   Interstitial pulmonary disease (HCC)   Benign prostatic hyperplasia without lower urinary tract symptoms   Long-term current use of opiate analgesic   Severe aortic stenosis   Hepatitis C   Acute on chronic heart failure with preserved ejection fraction (HFpEF) (HCC)   Allergies Allergies  Allergen Reactions   Integrilin [Eptifibatide] Other (See Comments)    Bleeding (non-specific)   Lopressor [Metoprolol Tartrate] Rash and Other (See Comments)    Bleeding (non-specific)    Penicillins Diarrhea and Rash    Diagnostic Studies/Procedures    TAVR OPERATIVE NOTE     Date of Procedure:                06/08/2023   Preoperative Diagnosis:      Severe Aortic Stenosis    Postoperative Diagnosis:    Same    Procedure:        Transcatheter Aortic Valve Replacement - Transfemoral Approach             Edwards Sapien 3 THV (size 26 mm, model # [id number], serial # [id number])              Co-Surgeons:                        Verne Carrow, MD and Eugenio Hoes , MD    Anesthesiologist:                  Maple Hudson   Echocardiographer:              Croitoru   Pre-operative Echo  Findings: Severe aortic stenosis Normal left ventricular systolic function   Post-operative Echo Findings: No paravalvular leak Normal left ventricular systolic function _____________    Echo 06/09/23: completed but pending formal read at the time of discharge   History of Present Illness     Tyler Hendricks is a 73 y.o. male with a history of HIV (undetectable viral load on Biktarvy), HTN, HLD, biascicular block (RBBB, LAFB), Hepatitis B, cirrhosis, thrombocytopenia, ILD in pulmonary rehab, CAD with multiple PCIs, DMT2, OSA not on CPAP, carotid artery disease and pLFLG severe aortic stenosis who presented to [facility] on 06/08/23 for planned TAVR.   He had been followed in our office since 2019 after moving to [place] from [place]. He has CAD and has had prior stenting of the LAD, obtuse marginal branch, left posterolateral branch and diagonal branch, all done in [place]. He has had HIV since 1989 with undetectable viral load. His pulmonary fibrosis is followed in the pulmonary clinic. He has sleep apnea but does not use CPAP. He has cirrhosis and mild esophageal varices. Echo in 03/2023 showed EF 55% and severe AS with  a mean grad 31 mmHg AVA 0.76 cm2, DVI 0.22, SVI 27. L/RHC 04/23/23 showed multivessel coronary artery disease with moderate nonobstructive disease with patent stents, except severe ISR in ostial diag stent. In absence of angina and proximal severe stenoses, medical therapy was recommended.   The patient was evaluated by the multidisciplinary valve team and felt to have severe, symptomatic aortic stenosis and to be a suitable candidate for TAVR, which was set up for 06/08/23.   Hospital Course     Consultants: none   Severe AS:  -- S/p successful TAVR with a 26 mm Edwards Sapien 3 Ultra Resilia THV via the TF approach on 06/08/23.  -- Post operative echo completed but pending formal read. -- Groin sites are stable.  -- Continued on home Plavix 75mg  daily.  -- Met with cardiac rehab to discuss CRP  phase II.  -- Plan for discharge home today with close follow up in the outpatient setting.   Acute on chronic HFpEF: -- LVEDP 28 mm hg at the time of TAVR. -- Treated with one dose of IV lasix 40mg  / Kdur today.  -- Resume home Hyzaar 100-25mg  daily.   Hypomagnesemia:  -- Mag 1.5. -- Supplemented.   CAD: -- [facility] 04/23/23 showed multivessel coronary artery disease with moderate nonobstructive disease with patent stents, except severe ISR in ostial diag stent.  -- In absence of angina and proximal severe stenoses, medical therapy was recommended.  -- Continue Plavix 75mg  daily and Crestor 10mg  daily.  Bifasicular block: -- (RBBB, LAFB) at baseline.  -- No progression of underlying confuction disease s/p TAVR.  -- Discharge with a ZIO AT to rule out delayed HAVB.   Thrombocytopenia: -- 2/2 cirrhosis. -- PLT 77 today.  HTN: -- BP elevated in the setting of holding home antihypertensives.  -- Resume home Norvasc 2.5mg  daily, atenolol 50mg  daily, and Hyzaar 100-25mg  daily.   DMT2:  -- Treated with SSI while admitted.  -- Resume home meds at discharge.  -- Okay to resume Syngardy XR after 48 hours after contrast dye exposure (3/21 AM)   HLD: -- Continue Crestor 10mg  at bedtime. -- Continue Vacepa 1g BID.   ILD: -- Followed by Tyler Hendricks. -- Continue pulmonary rehab. -- Ordered home 02 for discharge.   Lesion of liver:  -- Pre TAVR CT showed "Poorly characterized 3.2 cm hypodense lesion within the right hepatic lobe. This may represent a complex cyst or hyperdense mass. Contrast enhanced MRI examination is recommended for further characterization." -- This will be dsicussed in the outpatient setting.   Elevated Raise Score: -- Increased risk for amyloid heart disease based on severe AS and RBBB.  -- Will discuss screening in the outpatient setting.   _____________  Discharge Vitals Blood pressure (!) 142/68, pulse 85, temperature 99.2 F (37.3 C), temperature source  Oral, resp. rate 20, height 6' (1.829 m), weight 90.9 kg, SpO2 97%.  Filed Weights   06/08/23 1106 06/09/23 0314  Weight: (P) 91.6 kg 90.9 kg    GEN: Well nourished, well developed, in no acute distress HEENT: normal Neck: no JVD or masses Cardiac: RRR; 3/6 SEM heard best at RUSB. No rubs, or gallops, mild bilateral LE edema  Respiratory:  clear to auscultation bilaterally, normal work of breathing GI: soft, nontender, nondistended, + BS MS: no deformity or atrophy Skin: warm and dry, no rash  Groin sites clear without hematoma or ecchymosis. Right bandage with small amount of blood.  Neuro:  Alert and Oriented x 3, Strength and  sensation are intact Psych: euthymic mood, full affect  Disposition   Pt is being discharged home today in good condition.  Follow-up Plans & Appointments     Follow-up Information     Kathleene Hazel, MD. Go on 06/17/2023.   Specialty: Cardiology Why: @ 2pm, please arrive at least 15 minutes early. Contact information: [address] [place] [address] [phone]                Discharge Instructions     For home use only DME oxygen   Complete by: As directed    Length of Need: Lifetime   Mode or (Route): Nasal cannula   Liters per Minute: 6   Frequency: Continuous (stationary and portable oxygen unit needed)   Oxygen conserving device: Yes   Oxygen delivery system: Gas       Discharge Medications   Allergies as of 06/09/2023       Reactions   Integrilin [eptifibatide] Other (See Comments)   Bleeding (non-specific)   Lopressor [metoprolol Tartrate] Rash, Other (See Comments)   Bleeding (non-specific)    Penicillins Diarrhea, Rash        Medication List     PAUSE taking these medications    Synjardy XR 12.07-998 MG Tb24 Wait to take this until: June 11, 2023 Morning Generic drug: Empagliflozin-metFORMIN HCl ER Take 2 tablets by mouth daily.       TAKE these medications    amLODipine  2.5 MG tablet Commonly known as: NORVASC TAKE 1 TABLET BY MOUTH EVERY DAY   atenolol 50 MG tablet Commonly known as: TENORMIN TAKE 1 TABLET BY MOUTH EVERY DAY   Biktarvy 50-200-25 MG Tabs tablet Generic drug: bictegravir-emtricitabine-tenofovir AF Take 1 tablet by mouth daily.   cholecalciferol 25 MCG (1000 UNIT) tablet Commonly known as: VITAMIN D3 Take 1,000 Units by mouth daily.   clopidogrel 75 MG tablet Commonly known as: PLAVIX TAKE 1 TABLET BY MOUTH EVERY DAY   HYDROcodone bit-homatropine 5-1.5 MG/5ML syrup Commonly known as: HYCODAN Take 5 mLs by mouth every 6 (six) hours as needed (chronic cough - pulmonary fibrosis).   losartan-hydrochlorothiazide 100-25 MG tablet Commonly known as: HYZAAR Take 1 tablet by mouth daily.   multivitamin tablet Take 1 tablet by mouth daily.   Ofev 100 MG Caps Generic drug: Nintedanib TAKE 1 CAPSULE BY MOUTH 2 TIMES A DAY WITH FOOD. TAKE 12 HOURS APART.   Oxycodone HCl 10 MG Tabs Take 10 mg by mouth 3 (three) times daily as needed (Pain).   rosuvastatin 10 MG tablet Commonly known as: CRESTOR Take 1 tablet (10 mg total) by mouth daily. What changed: when to take this   Vascepa 1 g capsule Generic drug: icosapent Ethyl Take 2 capsules (2 g total) by mouth 2 (two) times daily.               Durable Medical Equipment  (From admission, onward)           Start     Ordered   06/09/23 0000  For home use only DME oxygen       Question Answer Comment  Length of Need Lifetime   Mode or (Route) Nasal cannula   Liters per Minute 6   Frequency Continuous (stationary and portable oxygen unit needed)   Oxygen conserving device Yes   Oxygen delivery system Gas      06/09/23 0926  Outstanding Labs/Studies   none  ______________________  Duration of Discharge Encounter: APP Time: 20 minutes    Signed, Cline Crock, PA-C 06/09/2023, 9:33 AM [phone]  I have personally seen and  examined this patient. I agree with the assessment and plan as outlined above. Patient with severe AS. Doing well today 1 day post TAVR with 26 mm Edwards Sapien 3 valve from the transfemoral approach.  No complaints today.  Labs reviewed EKG reviewed.  Telemetry with sinus My exam:  NAD,  CV: systolic murmur, RRR  Pulm: clear bilaterally  Ext: no LE edema. Bilateral groins ok without hematoma.   Plan: Discharge home today on Plavix. Follow up one week in our office.   I have spent 25 minutes on this discharge day in chart review including notes and labs, EKG review, telemetry review, patient examination and plan formulation. I have personally reviewed his echo today. MG 15 mmHg post TAVR   Verne Carrow, MD, [facility] 06/09/2023 10:16 AM

## 2023-06-09 ENCOUNTER — Inpatient Hospital Stay (HOSPITAL_BASED_OUTPATIENT_CLINIC_OR_DEPARTMENT_OTHER)
Admit: 2023-06-09 | Discharge: 2023-06-09 | Disposition: A | Discharge status: Home / Self Care | Attending: Physician Assistant | Admitting: Physician Assistant

## 2023-06-09 ENCOUNTER — Inpatient Hospital Stay (HOSPITAL_COMMUNITY)

## 2023-06-09 DIAGNOSIS — Z952 Presence of prosthetic heart valve: Secondary | ICD-10-CM | POA: Diagnosis not present

## 2023-06-09 DIAGNOSIS — I350 Nonrheumatic aortic (valve) stenosis: Secondary | ICD-10-CM

## 2023-06-09 DIAGNOSIS — I452 Bifascicular block: Secondary | ICD-10-CM

## 2023-06-09 LAB — CBC
HCT: 43.5 % (ref 39.0–52.0)
Hemoglobin: 15.1 g/dL (ref 13.0–17.0)
MCH: 34.2 pg — ABNORMAL HIGH (ref 26.0–34.0)
MCHC: 34.7 g/dL (ref 30.0–36.0)
MCV: 98.4 fL (ref 80.0–100.0)
Platelets: 77 10*3/uL — ABNORMAL LOW (ref 150–400)
RBC: 4.42 MIL/uL (ref 4.22–5.81)
RDW: 14.2 % (ref 11.5–15.5)
WBC: 4 10*3/uL (ref 4.0–10.5)
nRBC: 0 % (ref 0.0–0.2)

## 2023-06-09 LAB — BASIC METABOLIC PANEL
Anion gap: 5 (ref 5–15)
BUN: 10 mg/dL (ref 8–23)
CO2: 27 mmol/L (ref 22–32)
Calcium: 8.1 mg/dL — ABNORMAL LOW (ref 8.9–10.3)
Chloride: 106 mmol/L (ref 98–111)
Creatinine, Ser: 0.7 mg/dL (ref 0.61–1.24)
GFR, Estimated: >60 mL/min (ref >60)
Glucose, Bld: 134 mg/dL — ABNORMAL HIGH (ref 70–99)
Potassium: 3.9 mmol/L (ref 3.5–5.1)
Sodium: 138 mmol/L (ref 135–145)

## 2023-06-09 LAB — MAGNESIUM: Magnesium: 1.5 mg/dL — ABNORMAL LOW (ref 1.7–2.4)

## 2023-06-09 LAB — GLUCOSE, CAPILLARY: Glucose-Capillary: 118 mg/dL — ABNORMAL HIGH (ref 70–99)

## 2023-06-09 MED ORDER — POTASSIUM CHLORIDE CRYS ER 20 MEQ PO TBCR
40.0000 meq | EXTENDED_RELEASE_TABLET | Freq: Once | ORAL | Status: AC
  Administered 2023-06-09: 40 meq via ORAL
  Filled 2023-06-09: qty 2

## 2023-06-09 MED ORDER — LOSARTAN POTASSIUM 50 MG PO TABS
100.0000 mg | ORAL_TABLET | Freq: Every day | ORAL | Status: DC
  Administered 2023-06-09: 100 mg via ORAL
  Filled 2023-06-09: qty 2

## 2023-06-09 MED ORDER — FUROSEMIDE 10 MG/ML IJ SOLN
40.0000 mg | Freq: Once | INTRAMUSCULAR | Status: AC
  Administered 2023-06-09: 40 mg via INTRAVENOUS
  Filled 2023-06-09: qty 4

## 2023-06-09 MED ORDER — MAGNESIUM OXIDE -MG SUPPLEMENT 400 (240 MG) MG PO TABS
800.0000 mg | ORAL_TABLET | Freq: Once | ORAL | Status: AC
  Administered 2023-06-09: 800 mg via ORAL
  Filled 2023-06-09: qty 2

## 2023-06-09 MED ORDER — HYDROCHLOROTHIAZIDE 25 MG PO TABS
25.0000 mg | ORAL_TABLET | Freq: Every day | ORAL | Status: DC
  Administered 2023-06-09: 25 mg via ORAL
  Filled 2023-06-09: qty 1

## 2023-06-09 MED ORDER — LOSARTAN POTASSIUM 50 MG PO TABS: 100.0000 mg | ORAL_TABLET | Freq: Every day | ORAL | Status: DC

## 2023-06-09 MED ORDER — HYDROCHLOROTHIAZIDE 25 MG PO TABS: 25.0000 mg | ORAL_TABLET | Freq: Every day | ORAL | Status: DC

## 2023-06-09 NOTE — TOC CM/SW Note (Signed)
 Per Mobility Specialist note:   Nurse requested Mobility Specialist to perform oxygen saturation test with pt which includes removing pt from oxygen both at rest and while ambulating.  Below are the results from that testing.      Patient Saturations on Room Air at Rest = spO2 91%   Patient Saturations on Room Air while Ambulating = sp02 85% .  Rested and performed pursed lip breathing for 1 minute with sp02 at 88%.   Patient Saturations on 1 Liters of oxygen while Ambulating = sp02 92%   At end of testing pt left in room on 0  Liters of oxygen.     Feliciana Rossetti Mobility Specialist Please contact via Special educational needs teacher or  Rehab office at [phone]

## 2023-06-09 NOTE — Progress Notes (Signed)
 Discussed with pt and wife restrictions, walking, checking SpO2, low sodium, daily wts, and CRPII. Receptive. Discussed need for atleast 2L for exertion and monitoring SpO2. Will refer to [place] CRPII.  [id number] Ethelda Chick BS, ACSM-CEP 06/09/2023 11:27 AM

## 2023-06-09 NOTE — Progress Notes (Signed)
 Hospital to apply ZIO AT. Dr. Rosemary Holms to read.

## 2023-06-09 NOTE — Progress Notes (Signed)
 Nurse requested Mobility Specialist to perform oxygen saturation test with pt which includes removing pt from oxygen both at rest and while ambulating.  Below are the results from that testing.     Patient Saturations on Room Air at Rest = spO2 91%  Patient Saturations on Room Air while Ambulating = sp02 85% .  Rested and performed pursed lip breathing for 1 minute with sp02 at 88%.  Patient Saturations on 1 Liters of oxygen while Ambulating = sp02 92%  At end of testing pt left in room on 0  Liters of oxygen.   Feliciana Rossetti Mobility Specialist Please contact via Special educational needs teacher or  Rehab office at [phone]

## 2023-06-09 NOTE — Progress Notes (Addendum)
 Mobility Specialist Progress Note:   06/09/23 0945  Mobility  Activity Ambulated with assistance in hallway;Ambulated with assistance in room  Level of Assistance Modified independent, requires aide device or extra time  Assistive Device Other (Comment) (hand rails)  Distance Ambulated (ft) 200 ft  Activity Response Tolerated well  Mobility Referral Yes  Mobility visit 1 Mobility  Mobility Specialist Start Time (ACUTE ONLY) 0945  Mobility Specialist Stop Time (ACUTE ONLY) 0955  Mobility Specialist Time Calculation (min) (ACUTE ONLY) 10 min   Pt received in chair, eager for mobility session. Ambulated in hallway with ModI using hand rails. Tolerated well, several coughing spells during session. SpO2 91% on RA at rest. Took 3 standing rest breaks during ambulation d/t desat, SpO2 85% on RA. Increased O2 flow, SpO2 92% on 1L. Returned pt to room, left on RA. All needs met, call bell in reach.    Feliciana Rossetti Mobility Specialist Please contact via Special educational needs teacher or  Rehab office at [phone]

## 2023-06-09 NOTE — Progress Notes (Signed)
@  1324 Dr. Cherly Beach, provider on-call, text-paged regarding pt's BP this AM 170s-180s/80s-90s checked on both arms. Page promptly returned and orders received to restart some of pt's PO antihypertensives. Medications given and repeat BP 138/80. Will continue to monitor.

## 2023-06-09 NOTE — TOC Transition Note (Signed)
 Transition of Care [facility]) - Discharge Note Donn Pierini RN, BSN Transitions of Care Unit 4E- RN Case Manager See Treatment Team for direct phone #   Patient Details  Name: Tyler Hendricks MRN: [id number] Date of Birth: 1950-04-23  [facility]) CM/SW Contact:  Darrold Span, RN Phone Number: 06/09/2023, 1:24 PM   Clinical Narrative:    Pt stable for transition home w/ spouse s/p TAVR.  Note order has been placed for home 02.   CM in to speak with pt at bedside. Per pt he has been working with his pulmonary doctor for several months trying to get home 02 arranged. Pt reports that they have scheduled to have home 02 delivered April 29?? Pt is not sure what agency is going to be delivering home 02- per Cardiac/Pulm Rehab outpt Pulm. Rehab has been working to get pt home 02 - but there have been barriers that she is not sure about. Per her co-worker at the outside rehab- referral for DME-was sent to [facility].   CM placed call to [employer] liaison- Marthann Schiller- confirmed that they have referral for home 02 however there has been missing requirements by insurance and referral has not been able to be processed. New order for home 02 has been placed here for discharge as well as qualifying note- [employer] to use these to process w/ insurance- and will deliver portable 02 to bedside for pt to transport home with. Home set up will be delivered later today per [employer] liaison. Pt will continue to work with his pulmonary doctor for ongoing 02 needs.   No other TOC needs noted. Wife to transport home.    Final next level of care: Home/Self Care Barriers to Discharge: No Barriers Identified   Patient Goals and CMS Choice Patient states their goals for this hospitalization and ongoing recovery are:: return home CMS Medicare.gov Compare Post Acute Care list provided to:: Patient Choice offered to / list presented to : Patient      Discharge Placement               Home        Discharge Plan  and Services Additional resources added to the After Visit Summary for     Discharge Planning Services: CM Consult Post Acute Care Choice: Durable Medical Equipment          DME Arranged: Oxygen DME Agency: [employer] Date DME Agency Contacted: 06/09/23 Time DME Agency Contacted: 1115 Representative spoke with at DME Agency: Mitch HH Arranged: NA HH Agency: NA        Social Drivers of Health (SDOH) Interventions SDOH Screenings   Food Insecurity: No Food Insecurity (06/08/2023)  Housing: Low Risk  (06/09/2023)  Transportation Needs: No Transportation Needs (06/08/2023)  Utilities: Not At Risk (06/08/2023)  Alcohol Screen: Low Risk  (11/02/2022)  Depression (PHQ2-9): Low Risk  (04/14/2023)  Financial Resource Strain: Low Risk  (11/02/2022)  Physical Activity: Insufficiently Active (11/02/2022)  Social Connections: Socially Isolated (06/09/2023)  Stress: Stress Concern Present (11/02/2022)  Tobacco Use: Medium Risk (06/08/2023)     Readmission Risk Interventions    06/09/2023    1:24 PM  Readmission Risk Prevention Plan  Post Dischage Appt Complete  Medication Screening Complete  Transportation Screening Complete

## 2023-06-09 NOTE — Progress Notes (Signed)
 Discharge instructions given to the patient and his spouse, PIV removed, CCMD notified. Right groin dressing changed and educated him to reach out for medical help if he notice bleeding or s/s of hematoma.  DME delivered at bedside and Zio At also applied, patient had no any questions, belongings taken by the patient and family.

## 2023-06-09 NOTE — Progress Notes (Signed)
  Echocardiogram 2D Echocardiogram has been performed.  Janalyn Harder 06/09/2023, 9:48 AM

## 2023-06-10 ENCOUNTER — Telehealth: Payer: Self-pay | Admitting: *Deleted

## 2023-06-10 ENCOUNTER — Telehealth: Payer: Self-pay | Admitting: Physician Assistant

## 2023-06-10 ENCOUNTER — Encounter: Payer: Self-pay | Admitting: Internal Medicine

## 2023-06-10 ENCOUNTER — Other Ambulatory Visit: Payer: Self-pay

## 2023-06-10 ENCOUNTER — Encounter (HOSPITAL_COMMUNITY): Payer: Medicare Other

## 2023-06-10 DIAGNOSIS — I452 Bifascicular block: Secondary | ICD-10-CM | POA: Diagnosis not present

## 2023-06-10 DIAGNOSIS — Z952 Presence of prosthetic heart valve: Secondary | ICD-10-CM | POA: Diagnosis not present

## 2023-06-10 LAB — ECHOCARDIOGRAM COMPLETE
AR max vel: 2.41 cm2
AV Area VTI: 2.28 cm2
AV Area mean vel: 2.46 cm2
AV Mean grad: 17 mmHg
AV Peak grad: 29.9 mmHg
Ao pk vel: 2.73 m/s
Area-P 1/2: 2.89 cm2
Calc EF: 61 %
Height: 72 in
S' Lateral: 3.4 cm
Single Plane A2C EF: 52.5 %
Single Plane A4C EF: 68 %
Weight: 3206.37 [oz_av]

## 2023-06-10 NOTE — Telephone Encounter (Signed)
  HEART AND VASCULAR CENTER   MULTIDISCIPLINARY HEART VALVE TEAM   Patient contacted regarding discharge from [facility] on 06/09/23.  Patient understands to follow up with a Dr. Clifton James 3/27 at [address]  Patient understands discharge instructions? yes Patient understands medications and regimen? yes Patient understands to bring all medications to this visit? yes  Cline Crock PA-C  [facility]

## 2023-06-10 NOTE — Transitions of Care (Post Inpatient/ED Visit) (Signed)
 06/10/2023  Name: Tyler Hendricks MRN: [id number] DOB: 12/24/1950  Today's TOC FU Call Status: Today's TOC FU Call Status:: Successful TOC FU Call Completed TOC FU Call Complete Date: 06/10/23 Patient's Name and Date of Birth confirmed.  Transition Care Management Follow-up Telephone Call Date of Discharge: 06/09/23 Discharge Facility: [facility]) Type of Discharge: Inpatient Admission Primary Inpatient Discharge Diagnosis:: planned surgical TAVR How have you been since you were released from the hospital?: Better (per spouse: "I am a retired Charity fundraiser, so I am up to speed on all of his care and have no questions.  He is doing fine after this planned surgery.  We got the oxygen in case he needs to use it and he is wearing his home heart monitor.  All is going fine") Any questions or concerns?: No  Items Reviewed: Did you receive and understand the discharge instructions provided?: Yes (thoroughly reviewed with patient's spouse who verbalizes good understanding of same) Medications obtained,verified, and reconciled?: Yes (Medications Reviewed) (Full medication reconciliation/ review completed; no concerns or discrepancies identified; confirmed patient obtained/ is taking all newly Rx'd medications as instructed; self-manages medications and denies questions/ concerns around medications today) Any new allergies since your discharge?: No Dietary orders reviewed?: Yes Type of Diet Ordered:: Haert Healthy/ Low salt:  "He is trying to eat healthier" Do you have support at home?: Yes People in Home: spouse Name of Support/Comfort Primary Source: Reports independent in self-care activities; supportive spouse (retired Charity fundraiser) assists as/ if needed/ indicated  Medications Reviewed Today: Medications Reviewed Today     Reviewed by Michaela Corner, RN (Registered Nurse) on 06/10/23 at [phone]  Med List Status: <None>   Medication Order Taking? Sig Documenting Provider Last Dose Status Informant  amLODipine  (NORVASC) 2.5 MG tablet [id number] Yes TAKE 1 TABLET BY MOUTH EVERY DAY Myrlene Broker, MD Taking Active Self, Pharmacy Records  atenolol (TENORMIN) 50 MG tablet [id number] Yes TAKE 1 TABLET BY MOUTH EVERY DAY Myrlene Broker, MD Taking Active Self, Pharmacy Records  bictegravir-emtricitabine-tenofovir AF (BIKTARVY) 50-200-25 MG TABS tablet [id number] Yes Take 1 tablet by mouth daily. Veryl Speak, FNP Taking Active Self, Pharmacy Records  cholecalciferol (VITAMIN D3) 25 MCG (1000 UNIT) tablet [id number] Yes Take 1,000 Units by mouth daily. [provider] Taking Active Self, Pharmacy Records  clopidogrel (PLAVIX) 75 MG tablet [id number] Yes TAKE 1 TABLET BY MOUTH EVERY DAY Myrlene Broker, MD Taking Active Self, Pharmacy Records  HYDROcodone bit-homatropine [facility]) 5-1.5 MG/5ML syrup [id number] Yes Take 5 mLs by mouth every 6 (six) hours as needed (chronic cough - pulmonary fibrosis). Chilton Greathouse, MD Taking Active Self, Pharmacy Records  losartan-hydrochlorothiazide [facility]) 100-25 MG tablet [id number] Yes Take 1 tablet by mouth daily. Myrlene Broker, MD Taking Active Self, Pharmacy Records  Multiple Vitamin (MULTIVITAMIN) tablet [id number] Yes Take 1 tablet by mouth daily. [provider] Taking Active Self, Pharmacy Records  OFEV 100 MG CAPS [id number] Yes TAKE 1 CAPSULE BY MOUTH 2 TIMES A DAY WITH FOOD. TAKE 12 HOURS APART. Chilton Greathouse, MD Taking Active Self, Pharmacy Records  Oxycodone HCl 10 MG TABS [id number] Yes Take 10 mg by mouth 3 (three) times daily as needed (Pain). [provider] Taking Active Self, Pharmacy Records  rosuvastatin (CRESTOR) 10 MG tablet [id number] Yes Take 1 tablet (10 mg total) by mouth daily.  Patient taking differently: Take 10 mg by mouth at bedtime.   Myrlene Broker, MD Taking Active Self, Pharmacy Records  SYNJARDY XR 12.07-998 MG [phone]  [id number] Yes Take 2 tablets by mouth daily.  Patient taking  differently: Take 1 tablet by mouth 2 (two) times daily.   Myrlene Broker, MD Taking Active Self, Pharmacy Records           Med Note Jonnie Kind Jun 10, 2023  9:55 AM) 06/10/23: Spouse reports during [facility] call, they understand to resume taking tomorrow on 06/11/23  VASCEPA 1 g capsule [id number] Yes Take 2 capsules (2 g total) by mouth 2 (two) times daily. Myrlene Broker, MD Taking Active Self, Pharmacy Records           Home Care and Equipment/Supplies: Were Home Health Services Ordered?: No Any new equipment or medical supplies ordered?: Yes (home O2) Name of Medical supply agency?: [employer] Were you able to get the equipment/medical supplies?: Yes Do you have any questions related to the use of the equipment/supplies?: No  Functional Questionnaire: Do you need assistance with bathing/showering or dressing?: No Do you need assistance with meal preparation?: No Do you need assistance with eating?: No Do you have difficulty maintaining continence: No Do you need assistance with getting out of bed/getting out of a chair/moving?: No Do you have difficulty managing or taking your medications?: No (wife supervises/ assists as-if needed)  Follow up appointments reviewed: PCP Follow-up appointment confirmed?: NA (verified not indicated per hospital discharging provider discharge notes) Specialist Hospital Follow-up appointment confirmed?: Yes Date of Specialist follow-up appointment?: 06/17/23 Follow-Up Specialty Provider:: surgical cardiology provider Do you need transportation to your follow-up appointment?: No Do you understand care options if your condition(s) worsen?: Yes-patient verbalized understanding  SDOH Interventions Today    Flowsheet Row Most Recent Value  SDOH Interventions   Food Insecurity Interventions Intervention Not Indicated  Housing Interventions Intervention Not Indicated  Transportation Interventions Intervention Not Indicated  [spouse  provides transportation]  Utilities Interventions Intervention Not Indicated      Interventions Today    Flowsheet Row Most Recent Value  Chronic Disease   Chronic disease during today's visit Other  [planned surgical TAVR]  General Interventions   General Interventions Discussed/Reviewed General Interventions Discussed, Durable Medical Equipment (DME), Doctor Visits  Doctor Visits Discussed/Reviewed Doctor Visits Discussed, Specialist, PCP  Durable Medical Equipment (DME) Oxygen, Other  [confirmed not currently requiring/ using assistive devices for ambulation]  PCP/Specialist Visits Compliance with follow-up visit  Exercise Interventions   Exercise Discussed/Reviewed Exercise Discussed  [confirmed patient has referral in progress for cardiac rehabilitation]  Nutrition Interventions   Nutrition Discussed/Reviewed Nutrition Discussed, Decreasing salt  Pharmacy Interventions   Pharmacy Dicussed/Reviewed Pharmacy Topics Discussed  [Full medication review with updating medication list in EHR per patient report]      TOC Interventions Today    Flowsheet Row Most Recent Value  TOC Interventions   TOC Interventions Discussed/Reviewed TOC Interventions Discussed, Post op wound/incision care, Post discharge activity limitations per provider  [Spouse declines need for ongoing/ further care management outreach,  declines enrollment in 30-day TOC program,  provided my direct contact information should questions/ concerns/ needs arise post-TOC call]      Total time spent from review to signing of note/ including any care coordination interventions:  42 minutes- includes time for initial outreach placed just prior to this call  Pls call/ message for questions,  Caryl Pina, RN, BSN, Media planner  Transitions of Care  [facility] - [facility] [phone]: direct office

## 2023-06-10 NOTE — Transitions of Care (Post Inpatient/ED Visit) (Signed)
   06/10/2023  Name: Aulton Routt MRN: [id number] DOB: [birth year]  Today's TOC FU Call Status: Today's TOC FU Call Status:: Unsuccessful Call (1st Attempt) Unsuccessful Call (1st Attempt) Date: 06/10/23  Attempted to reach the patient regarding the most recent Inpatient visit; left HIPAA compliant voice message requesting call back  Follow Up Plan: Additional outreach attempts will be made to reach the patient to complete the Transitions of Care (Post Inpatient visit) call.   Pls call/ message for questions,  Caryl Pina, RN, BSN, CCRN Alumnus RN Care Manager  Transitions of Care  [facility] - [facility] [phone]: direct office

## 2023-06-11 ENCOUNTER — Other Ambulatory Visit (HOSPITAL_COMMUNITY)

## 2023-06-11 ENCOUNTER — Telehealth: Payer: Self-pay | Admitting: Pulmonary Disease

## 2023-06-11 DIAGNOSIS — R053 Chronic cough: Secondary | ICD-10-CM

## 2023-06-11 MED ORDER — HYDROCODONE BIT-HOMATROP MBR 5-1.5 MG/5ML PO SOLN: 5.0000 mL | Freq: Four times a day (QID) | ORAL | 0 refills | Status: DC | PRN

## 2023-06-11 MED FILL — Norepinephrine-Dextrose IV Solution 4 MG/250ML-5%: INTRAVENOUS | Qty: 250 | Status: AC

## 2023-06-11 NOTE — Telephone Encounter (Signed)
 Patient would like a refill of his cough syrup medicine.  [facility] on [address] and [address]

## 2023-06-11 NOTE — Telephone Encounter (Signed)
 I have refilled his cough syrup as requested.  Patient called and informed.  He is doing well after recent TAVR procedure Nothing further needed

## 2023-06-11 NOTE — Anesthesia Postprocedure Evaluation (Signed)
 Anesthesia Post Note  Patient: Tyler Hendricks  Procedure(s) Performed: Transcatheter Aortic Valve Replacement, Transfemoral ECHOCARDIOGRAM, TRANSTHORACIC     Patient location during evaluation: Cath Lab Anesthesia Type: MAC Level of consciousness: awake and alert Pain management: pain level controlled Vital Signs Assessment: post-procedure vital signs reviewed and stable Respiratory status: spontaneous breathing, nonlabored ventilation and respiratory function stable Cardiovascular status: stable and blood pressure returned to baseline Postop Assessment: no apparent nausea or vomiting Anesthetic complications: no   There were no known notable events for this encounter.                  Marbeth Smedley

## 2023-06-11 NOTE — Telephone Encounter (Signed)
 Called and spoke to patient.  He is requesting refill on Hycodan 5-1.5 for chronic cough. Cough is prod with white sputum. SOB is baseline. Denied f/c/s, wheezing or additional sx.   Hycodan last refilled 05/20/2023 #[id number]  Dr. Isaiah Serge, please advise. Thanks

## 2023-06-14 DIAGNOSIS — M48062 Spinal stenosis, lumbar region with neurogenic claudication: Secondary | ICD-10-CM | POA: Diagnosis not present

## 2023-06-14 DIAGNOSIS — M51362 Other intervertebral disc degeneration, lumbar region with discogenic back pain and lower extremity pain: Secondary | ICD-10-CM | POA: Diagnosis not present

## 2023-06-14 DIAGNOSIS — Z79891 Long term (current) use of opiate analgesic: Secondary | ICD-10-CM | POA: Diagnosis not present

## 2023-06-14 DIAGNOSIS — G894 Chronic pain syndrome: Secondary | ICD-10-CM | POA: Diagnosis not present

## 2023-06-14 DIAGNOSIS — M48061 Spinal stenosis, lumbar region without neurogenic claudication: Secondary | ICD-10-CM | POA: Diagnosis not present

## 2023-06-14 DIAGNOSIS — M419 Scoliosis, unspecified: Secondary | ICD-10-CM | POA: Diagnosis not present

## 2023-06-14 DIAGNOSIS — M5416 Radiculopathy, lumbar region: Secondary | ICD-10-CM | POA: Diagnosis not present

## 2023-06-14 DIAGNOSIS — J84112 Idiopathic pulmonary fibrosis: Secondary | ICD-10-CM | POA: Diagnosis not present

## 2023-06-14 DIAGNOSIS — Z79899 Other long term (current) drug therapy: Secondary | ICD-10-CM | POA: Diagnosis not present

## 2023-06-15 ENCOUNTER — Encounter (HOSPITAL_COMMUNITY): Payer: Medicare Other

## 2023-06-16 NOTE — Telephone Encounter (Signed)
 Spoke with patient regarding prior message . Patient would like a POC . IM sending this message to the PCC's to have the look into this . DME [employer]

## 2023-06-16 NOTE — Telephone Encounter (Unsigned)
 Copied from CRM [id number]. Topic: General - Other >> Jun 16, 2023  4:08 PM Brennan Bailey S wrote: Reason for CRM: patient would like to see if Dr. Isaiah Serge can order him mini packets of oxygen because the tank is a bit too much for him to carry. Please call patient back at [phone]

## 2023-06-17 ENCOUNTER — Ambulatory Visit: Attending: Cardiovascular Disease | Admitting: Cardiovascular Disease

## 2023-06-17 ENCOUNTER — Encounter: Payer: Self-pay | Admitting: Cardiovascular Disease

## 2023-06-17 ENCOUNTER — Telehealth (HOSPITAL_COMMUNITY): Payer: Self-pay

## 2023-06-17 ENCOUNTER — Encounter (HOSPITAL_COMMUNITY): Payer: Medicare Other

## 2023-06-17 VITALS — BP 162/80 | HR 58 | Ht 72.0 in | Wt 194.8 lb

## 2023-06-17 DIAGNOSIS — I10 Essential (primary) hypertension: Secondary | ICD-10-CM | POA: Diagnosis not present

## 2023-06-17 DIAGNOSIS — Z952 Presence of prosthetic heart valve: Secondary | ICD-10-CM | POA: Diagnosis not present

## 2023-06-17 DIAGNOSIS — I350 Nonrheumatic aortic (valve) stenosis: Secondary | ICD-10-CM | POA: Diagnosis not present

## 2023-06-17 MED ORDER — AMLODIPINE BESYLATE 5 MG PO TABS: 5.0000 mg | ORAL_TABLET | Freq: Every day | ORAL | 3 refills | Status: DC

## 2023-06-17 NOTE — Telephone Encounter (Signed)
 Called patient to see if he is interested in the Cardiac Rehab Program. Patient expressed interest. Explained scheduling process and went over insurance, patient verbalized understanding. Will contact patient for scheduling once f/u has been completed.

## 2023-06-17 NOTE — Progress Notes (Signed)
 Structural Heart Clinic Note  Chief Complaint  Patient presents with   Follow-up    S/p AVR   History of Present Illness: 73 yo male with history of HIV, HTN, HLD, cirrhosis, Hepatitis B, pulmonary fibrosis, CAD, DM, carotid artery disease and aortic stenosis now s/p TAVR who is here today for one week TAVR follow up. He had been followed in our office by Dr. Eldridge Dace since 2019 after moving to [facility] from [place]. He has CAD and has had prior stenting of the LAD, obtuse marginal branch, left posterolateral branch and diagonal branch, all done in [place]. He has had HIV since 1989 with undetectable viral load. His pulmonary fibrosis is followed in the pulmonary clinic. He has sleep apnea but does not use CPAP. He has cirrhosis and mild esophageal varices. He has been followed for moderate aortic stenosis since 2018. Echo January 2025 with LVEF=55-60%, grade 1 diastolic dysfunction. Heavily calcified aortic valve with paradoxical low flow/low gradient severe aortic stenosis with mean gradient of 31 mmHg, AVA 0.76 cm2, DI 0.22, SVI 27. He had progressive dyspnea. Cardiac cath 04/23/23 with patent LAD/Diagonal stent, patent ramus intermediate stent and moderate non-obstructive disease in the Circumflex and RCA. He underwent TAVR on 06/08/23 with placement of a 26 mm Edwards Sapien 3 Ultra Resilia THV from the transfemoral approach. He did well following his procedure. He was given one dose of IV Lasix. Echo on 06/09/23 with LVEF=65-70%. AVR with elevated gradients felt to be due to increased flow. No  PVL. He was discharged with a Zio cardiac monitor secondary to baseline RBBB, LAFB.   He is here today for follow up. The patient denies any chest pain, dyspnea, palpitations, lower extremity edema, orthopnea, PND, dizziness, near syncope or syncope.   Primary Care Physician: Myrlene Broker, MD Primary Cardiologist: Rosemary Holms  Past Medical History:  Diagnosis Date   CAD (coronary artery disease)    Diabetes  mellitus without complication (HCC)    Essential (primary) hypertension    Gallstones    Hepatic cirrhosis (HCC)    Hepatitis B core antibody positive 01/08/2019   Hepatitis C    HIV infection (HCC)    Hyperlipidemia    Hypertension    Internal hemorrhoids    Kidney stones    Pulmonary fibrosis (HCC)    S/P TAVR (transcatheter aortic valve replacement) 06/08/2023   s/p TAVR with a 26mm Edwards S3UR via the TF approach by Dr. Clifton James & Dr. Leafy Ro   Severe aortic stenosis    Sleep apnea    severe, on CPAP   Tubular adenoma of colon     Past Surgical History:  Procedure Laterality Date   CARDIAC CATHETERIZATION Left 04/2013   chlecystectomy     CHOLECYSTECTOMY     COLONOSCOPY  05/02/2013   GAS/FLUID EXCHANGE Right 12/30/2021   Procedure: GAS/FLUID EXCHANGE;  Surgeon: Carmela Rima, MD;  Location: [facility] OR;  Service: Ophthalmology;  Laterality: Right;  SF6   INTRAOPERATIVE TRANSTHORACIC ECHOCARDIOGRAM N/A 06/08/2023   Procedure: ECHOCARDIOGRAM, TRANSTHORACIC;  Surgeon: Kathleene Hazel, MD;  Location: [facility] INVASIVE CV LAB;  Service: Cardiovascular;  Laterality: N/A;   PARS PLANA VITRECTOMY Right 12/30/2021   Procedure: PARS PLANA VITRECTOMY WITH 25 GAUGE;  Surgeon: Carmela Rima, MD;  Location: [facility] OR;  Service: Ophthalmology;  Laterality: Right;   PERCUTANEOUS CORONARY STENT INTERVENTION (PCI-S)     PHOTOCOAGULATION WITH LASER Right 12/30/2021   Procedure: PHOTOCOAGULATION WITH LASER;  Surgeon: Carmela Rima, MD;  Location: [facility] OR;  Service: Ophthalmology;  Laterality: Right;   POLYPECTOMY     RIGHT/LEFT HEART CATH AND CORONARY ANGIOGRAPHY N/A 04/23/2023   Procedure: RIGHT/LEFT HEART CATH AND CORONARY ANGIOGRAPHY;  Surgeon: Elder Negus, MD;  Location: [facility] INVASIVE CV LAB;  Service: Cardiovascular;  Laterality: N/A;   UMBILICAL HERNIA REPAIR      Current Outpatient Medications  Medication Sig Dispense Refill   amLODipine (NORVASC) 5 MG tablet Take 1 tablet (5 mg  total) by mouth daily. 90 tablet 3   atenolol (TENORMIN) 50 MG tablet TAKE 1 TABLET BY MOUTH EVERY DAY 90 tablet 3   bictegravir-emtricitabine-tenofovir AF (BIKTARVY) 50-200-25 MG TABS tablet Take 1 tablet by mouth daily. 30 tablet 11   clopidogrel (PLAVIX) 75 MG tablet TAKE 1 TABLET BY MOUTH EVERY DAY 90 tablet 3   HYDROcodone bit-homatropine (HYCODAN) 5-1.5 MG/5ML syrup Take 5 mLs by mouth every 6 (six) hours as needed (chronic cough - pulmonary fibrosis). 473 mL 0   losartan-hydrochlorothiazide (HYZAAR) 100-25 MG tablet Take 1 tablet by mouth daily. 90 tablet 3   Multiple Vitamin (MULTIVITAMIN) tablet Take 1 tablet by mouth daily.     OFEV 100 MG CAPS TAKE 1 CAPSULE BY MOUTH 2 TIMES A DAY WITH FOOD. TAKE 12 HOURS APART. 180 capsule 1   Oxycodone HCl 10 MG TABS Take 10 mg by mouth 3 (three) times daily as needed (Pain).     rosuvastatin (CRESTOR) 10 MG tablet Take 1 tablet (10 mg total) by mouth daily. (Patient taking differently: Take 10 mg by mouth at bedtime.) 90 tablet 3   SYNJARDY XR 12.07-998 MG TB24 Take 2 tablets by mouth daily. (Patient taking differently: Take 1 tablet by mouth 2 (two) times daily.) 180 tablet 3   VASCEPA 1 g capsule Take 2 capsules (2 g total) by mouth 2 (two) times daily. 360 capsule 3   cholecalciferol (VITAMIN D3) 25 MCG (1000 UNIT) tablet Take 1,000 Units by mouth daily. (Patient not taking: Reported on 06/17/2023)     No current facility-administered medications for this visit.    Allergies  Allergen Reactions   Integrilin [Eptifibatide] Other (See Comments)    Bleeding (non-specific)   Lopressor [Metoprolol Tartrate] Rash and Other (See Comments)    Bleeding (non-specific)    Penicillins Diarrhea and Rash    Social History   Socioeconomic History   Marital status: Married    Spouse name: Not on file   Number of children: 1   Years of education: Not on file   Highest education level: 9th grade  Occupational History   Occupation: retired    Occupation: Retired Psychologist, forensic  Tobacco Use   Smoking status: Former    Current packs/day: 0.00    Average packs/day: 2.0 packs/day for 20.0 years (40.0 ttl pk-yrs)    Types: Cigarettes    Start date: 31    Quit date: 1994    Years since quitting: 31.2    Passive exposure: Past   Smokeless tobacco: Never  Vaping Use   Vaping status: Never Used  Substance and Sexual Activity   Alcohol use: Not Currently    Comment: social-occ beer   Drug use: No   Sexual activity: Yes  Other Topics Concern   Not on file  Social History Narrative   Not on file   Social Drivers of Health   Financial Resource Strain: Low Risk  (11/02/2022)   Overall Financial Resource Strain (CARDIA)    Difficulty of Paying Living Expenses: Not hard at all  Food Insecurity: No  Food Insecurity (06/10/2023)   Hunger Vital Sign    Worried About Running Out of Food in the Last Year: Never true    Ran Out of Food in the Last Year: Never true  Transportation Needs: No Transportation Needs (06/10/2023)   PRAPARE - Administrator, Civil Service (Medical): No    Lack of Transportation (Non-Medical): No  Physical Activity: Insufficiently Active (11/02/2022)   Exercise Vital Sign    Days of Exercise per Week: 2 days    Minutes of Exercise per Session: 30 min  Stress: Stress Concern Present (11/02/2022)   [employer] of Occupational Health - Occupational Stress Questionnaire    Feeling of Stress : To some extent  Social Connections: Socially Isolated (06/09/2023)   Social Connection and Isolation Panel [NHANES]    Frequency of Communication with Friends and Family: Twice a week    Frequency of Social Gatherings with Friends and Family: Never    Attends Religious Services: Never    Database administrator or Organizations: No    Attends Banker Meetings: Never    Marital Status: Married  Catering manager Violence: Patient Unable To Answer (06/10/2023)   Humiliation, Afraid,  Rape, and Kick questionnaire    Fear of Current or Ex-Partner: Patient unable to answer    Emotionally Abused: Patient unable to answer    Physically Abused: Patient unable to answer    Sexually Abused: Patient unable to answer    Family History  Problem Relation Age of Onset   Emphysema Mother        pulmonary   Heart attack Father 21   Lung cancer Sister    Esophageal cancer Brother    Esophageal cancer Brother    Colon cancer Neg Hx    Inflammatory bowel disease Neg Hx    Liver disease Neg Hx    Pancreatic cancer Neg Hx    Rectal cancer Neg Hx    Stomach cancer Neg Hx    Colon polyps Neg Hx    Sleep apnea Neg Hx     Review of Systems:  As stated in the HPI and otherwise negative.   BP (!) 162/80   Pulse (!) 58   Ht 6' (1.829 m)   Wt 88.4 kg   SpO2 94%   BMI 26.42 kg/m   Physical Examination: General: Well developed, well nourished, NAD  HEENT: OP clear, mucus membranes moist  SKIN: warm, dry. No rashes. Neuro: No focal deficits  Musculoskeletal: Muscle strength 5/5 all ext  Psychiatric: Mood and affect normal  Neck: No JVD, no carotid bruits, no thyromegaly, no lymphadenopathy.  Lungs:Clear bilaterally, no wheezes, rhonci, crackles Cardiovascular: Regular rate and rhythm. No murmurs, gallops or rubs. Abdomen:Soft. Bowel sounds present. Non-tender.  Extremities: No lower extremity edema. Pulses are 2 + in the bilateral DP/PT.  EKG:  EKG is not ordered today. The ekg ordered today demonstrates   Recent Labs: 08/28/2022: NT-Pro BNP 74 12/10/2022: TSH 1.85 06/04/2023: ALT 19 06/09/2023: BUN 10; Creatinine, Ser 0.70; Hemoglobin 15.1; Magnesium 1.5; Platelets 77; Potassium 3.9; Sodium 138    Wt Readings from Last 3 Encounters:  06/17/23 88.4 kg  06/09/23 90.9 kg  05/25/23 89 kg    Assessment and Plan:   1. Severe Aortic Valve Stenosis s/p TAVR: He is now one week post TAVR. Groins stable. He has no complaints except for baseline dyspnea from his pulmonary  fibrosis. NYHA class 2 symptoms. Will continue Plavix. Echo in one month. SBE prophylaxis  as needed. Will follow results of Zio cardiac monitor.   2. HTN: BP is elevated today and has been elevated at home. Will increase Norvasc to 5 mg per day.   Labs/ tests ordered today include:  No orders of the defined types were placed in this encounter.  Disposition:   F/U will be arranged with the structural team for one month post TAVR f/u.   Signed, Verne Carrow, MD, [facility] 06/17/2023 4:16 PM    [facility] [address] [place], [place], [place]  [address] Phone: [phone]; Fax: [phone]

## 2023-06-17 NOTE — Patient Instructions (Signed)
 Medication Instructions:  Your physician has recommended you make the following change in your medication:  1.) increase amlodipine to 5 mg - one tablet daily  *If you need a refill on your cardiac medications before your next appointment, please call your pharmacy*   Lab Work: none If you have labs (blood work) drawn today and your tests are completely normal, you will receive your results only by: MyChart Message (if you have MyChart) OR A paper copy in the mail If you have any lab test that is abnormal or we need to change your treatment, we will call you to review the results.   Testing/Procedures: Echo as planned   Follow-Up: As planned  Other Instructions

## 2023-06-17 NOTE — Telephone Encounter (Signed)
 Pt insurance is active and benefits verified through Medicare A/B. Co-pay $0.00, DED $257.00/$257.00 met, out of pocket $0.00/$0.00 met, co-insurance 20%. No pre-authorization required. [employer], 06/17/23 @ 10:59AM, REF#[id number]   How many CR sessions are covered? (36 visits for TCR, 72 visits for ICR)72 Is this a lifetime maximum or an annual maximum? Lifetime Has the member used any of these services to date? No Is there a time limit (weeks/months) on start of program and/or program completion? No   2ndary insurance is active and benefits verified through [facility] [employer]. Co-pay $0.00, DED $0.00/$0.00 met, out of pocket $0.00/$0.00 met, co-insurance 0%. No pre-authorization required. Laurie[employer], 06/17/23 @ 11:11AM, ZOX#[id number]      Will contact patient to see if he is interested in the Cardiac Rehab Program. If interested, patient will need to complete follow up appt. Once completed, patient will be contacted for scheduling upon review by the RN Navigator.

## 2023-06-18 ENCOUNTER — Ambulatory Visit: Payer: Medicare Other | Admitting: Podiatry

## 2023-06-18 ENCOUNTER — Encounter: Payer: Self-pay | Admitting: Podiatry

## 2023-06-18 DIAGNOSIS — E118 Type 2 diabetes mellitus with unspecified complications: Secondary | ICD-10-CM

## 2023-06-18 DIAGNOSIS — M79674 Pain in right toe(s): Secondary | ICD-10-CM | POA: Diagnosis not present

## 2023-06-18 DIAGNOSIS — M79675 Pain in left toe(s): Secondary | ICD-10-CM

## 2023-06-18 DIAGNOSIS — B351 Tinea unguium: Secondary | ICD-10-CM | POA: Diagnosis not present

## 2023-06-18 NOTE — Progress Notes (Signed)
 This patient returns to my office for at risk foot care.  This patient requires this care by a professional since this patient will be at risk due to having diabetes and HIV.  This patient is unable to cut nails himself since the patient cannot reach his nails.These nails are painful walking and wearing shoes.  This patient presents for at risk foot care today.  General Appearance  Alert, conversant and in no acute stress.  Vascular  Dorsalis pedis and posterior tibial  pulses are palpable  bilaterally.  Capillary return is within normal limits  bilaterally. Temperature is within normal limits  bilaterally.  Neurologic  Senn-Weinstein monofilament wire test within normal limits  bilaterally. Muscle power within normal limits bilaterally.  Nails Thick disfigured discolored nails with subungual debris  hallux nails bilaterally. No evidence of bacterial infection or drainage bilaterally.  Orthopedic  No limitations of motion  feet .  No crepitus or effusions noted.  No bony pathology or digital deformities noted.  Skin  normotropic skin with no porokeratosis noted bilaterally.  No signs of infections or ulcers noted.     Onychomycosis  Pain in right toes  Pain in left toes  Consent was obtained for treatment procedures.   Mechanical debridement of nails 1-5  bilaterally performed with a nail nipper.  Filed with dremel without incident.    Return office visit   4 months                   Told patient to return for periodic foot care and evaluation due to potential at risk complications.   Helane Gunther DPM

## 2023-06-18 NOTE — Telephone Encounter (Addendum)
 Spoke with Elease Hashimoto, It seems like he was discharged with a TOC and as long as he qualifies and it's documented in the latest OV notes by the provider with a Referral for a POC with the Liter flow then the patient is able to get a POC.

## 2023-06-18 NOTE — Telephone Encounter (Signed)
 Left VM for Elease Hashimoto To get back to me.

## 2023-06-18 NOTE — Telephone Encounter (Signed)
 Spoke with Clydie Braun patient's wife regarding prior message Patient does have a office visit on 07/20/2023 and patient will need a updated walk text . Patient was advised to keep office visit.  Nothing else further needed

## 2023-06-22 ENCOUNTER — Encounter (HOSPITAL_COMMUNITY): Payer: Medicare Other

## 2023-06-22 ENCOUNTER — Other Ambulatory Visit: Payer: Self-pay

## 2023-06-22 DIAGNOSIS — Z113 Encounter for screening for infections with a predominantly sexual mode of transmission: Secondary | ICD-10-CM

## 2023-06-22 DIAGNOSIS — B20 Human immunodeficiency virus [HIV] disease: Secondary | ICD-10-CM

## 2023-06-24 ENCOUNTER — Encounter (HOSPITAL_COMMUNITY): Payer: Medicare Other

## 2023-06-29 ENCOUNTER — Encounter (HOSPITAL_COMMUNITY): Payer: Medicare Other

## 2023-07-01 ENCOUNTER — Encounter (HOSPITAL_COMMUNITY): Payer: Medicare Other

## 2023-07-02 ENCOUNTER — Other Ambulatory Visit: Payer: Medicare Other

## 2023-07-02 ENCOUNTER — Other Ambulatory Visit: Payer: Self-pay

## 2023-07-02 DIAGNOSIS — Z113 Encounter for screening for infections with a predominantly sexual mode of transmission: Secondary | ICD-10-CM | POA: Diagnosis not present

## 2023-07-02 DIAGNOSIS — B20 Human immunodeficiency virus [HIV] disease: Secondary | ICD-10-CM

## 2023-07-02 NOTE — Addendum Note (Signed)
 Addended by: Harley Alto on: 07/02/2023 08:38 AM   Modules accepted: Orders

## 2023-07-03 LAB — C. TRACHOMATIS/N. GONORRHOEAE RNA
C. trachomatis RNA, TMA: NOT DETECTED
N. gonorrhoeae RNA, TMA: NOT DETECTED

## 2023-07-06 ENCOUNTER — Encounter (HOSPITAL_COMMUNITY): Payer: Medicare Other

## 2023-07-06 ENCOUNTER — Other Ambulatory Visit: Payer: Self-pay

## 2023-07-06 LAB — T-HELPER CELLS (CD4) COUNT (NOT AT ARMC)
Absolute CD4: 757 {cells}/uL (ref 490–1740)
CD4 T Helper %: 58 % (ref 30–61)
Total lymphocyte count: 1302 {cells}/uL (ref 850–3900)

## 2023-07-06 LAB — RPR: RPR Ser Ql: NONREACTIVE

## 2023-07-06 LAB — HIV-1 RNA QUANT-NO REFLEX-BLD
HIV 1 RNA Quant: NOT DETECTED {copies}/mL
HIV-1 RNA Quant, Log: NOT DETECTED {Log_copies}/mL

## 2023-07-07 ENCOUNTER — Encounter (HOSPITAL_COMMUNITY): Payer: Self-pay | Admitting: Thoracic Surgery (Cardiothoracic Vascular Surgery)

## 2023-07-08 ENCOUNTER — Encounter (HOSPITAL_COMMUNITY): Payer: Medicare Other

## 2023-07-09 ENCOUNTER — Other Ambulatory Visit: Payer: Self-pay | Admitting: Pharmacy Technician

## 2023-07-09 ENCOUNTER — Other Ambulatory Visit: Payer: Self-pay

## 2023-07-09 NOTE — Progress Notes (Signed)
 Specialty Pharmacy Refill Coordination Note  Tyler Hendricks is a 73 y.o. male contacted today regarding refills of specialty medication(s) Bictegravir-Emtricitab-Tenofov (Biktarvy )   Patient requested (Patient-Rptd) Delivery   Delivery date: (Patient-Rptd) 07/19/23   Verified address: (Patient-Rptd) [address] [name]    Medication will be filled on 07/16/23.

## 2023-07-12 ENCOUNTER — Encounter: Payer: Self-pay | Admitting: Physician Assistant

## 2023-07-12 ENCOUNTER — Ambulatory Visit: Payer: Self-pay

## 2023-07-12 ENCOUNTER — Ambulatory Visit: Attending: Internal Medicine

## 2023-07-12 ENCOUNTER — Other Ambulatory Visit: Payer: Self-pay | Admitting: Pulmonary Disease

## 2023-07-12 ENCOUNTER — Ambulatory Visit (INDEPENDENT_AMBULATORY_CARE_PROVIDER_SITE_OTHER): Admitting: Physician Assistant

## 2023-07-12 VITALS — BP 158/74 | HR 72 | Resp 16 | Ht 72.0 in | Wt 189.8 lb

## 2023-07-12 DIAGNOSIS — E854 Organ-limited amyloidosis: Secondary | ICD-10-CM

## 2023-07-12 DIAGNOSIS — J849 Interstitial pulmonary disease, unspecified: Secondary | ICD-10-CM | POA: Insufficient documentation

## 2023-07-12 DIAGNOSIS — Z952 Presence of prosthetic heart valve: Secondary | ICD-10-CM

## 2023-07-12 DIAGNOSIS — K769 Liver disease, unspecified: Secondary | ICD-10-CM | POA: Diagnosis not present

## 2023-07-12 DIAGNOSIS — I43 Cardiomyopathy in diseases classified elsewhere: Secondary | ICD-10-CM | POA: Insufficient documentation

## 2023-07-12 DIAGNOSIS — I2510 Atherosclerotic heart disease of native coronary artery without angina pectoris: Secondary | ICD-10-CM | POA: Insufficient documentation

## 2023-07-12 DIAGNOSIS — E785 Hyperlipidemia, unspecified: Secondary | ICD-10-CM

## 2023-07-12 DIAGNOSIS — R053 Chronic cough: Secondary | ICD-10-CM

## 2023-07-12 DIAGNOSIS — I452 Bifascicular block: Secondary | ICD-10-CM

## 2023-07-12 DIAGNOSIS — I10 Essential (primary) hypertension: Secondary | ICD-10-CM | POA: Insufficient documentation

## 2023-07-12 DIAGNOSIS — I5032 Chronic diastolic (congestive) heart failure: Secondary | ICD-10-CM

## 2023-07-12 LAB — ECHOCARDIOGRAM COMPLETE
AR max vel: 1.61 cm2
AV Area VTI: 1.78 cm2
AV Area mean vel: 1.52 cm2
AV Mean grad: 15.3 mmHg
AV Peak grad: 28.7 mmHg
Ao pk vel: 2.68 m/s
Calc EF: 70.2 %
S' Lateral: 2.95 cm
Single Plane A2C EF: 65.6 %
Single Plane A4C EF: 72.8 %

## 2023-07-12 MED ORDER — AMLODIPINE BESYLATE 10 MG PO TABS: 10.0000 mg | ORAL_TABLET | Freq: Every day | ORAL | 3 refills | Status: DC

## 2023-07-12 NOTE — Progress Notes (Signed)
 HEART AND VASCULAR CENTER   MULTIDISCIPLINARY HEART VALVE CLINIC                                     Cardiology Office Note:    Date:  07/12/2023   ID:  Adelita Honer, DOB 30-Nov-1950, MRN [id number]  PCP:  Adelia Homestead, MD  [facility] Cardiologist:  Cody Das, MD  [facility] Structural heart: Antoinette Batman, MD [facility] Electrophysiologist:  None   Referring MD: Adelia Homestead, *   1 month s/p TAVR  History of Present Illness:    Tyler Hendricks is a 73 y.o. male with a hx of HIV (undetectable viral load on Biktarvy ), HTN, HLD, biascicular block (RBBB, LAFB), Hepatitis B, cirrhosis, thrombocytopenia, ILD in pulmonary rehab, CAD with multiple PCIs, DMT2, OSA not on CPAP, carotid artery disease and pLFLG severe aortic stenosis s/p TAVR (06/08/23) who presents to clinic for follow up.   He had been followed in our office since 2019 after moving to  from [place]. He has CAD and has had prior stenting of the LAD, obtuse marginal branch, left posterolateral branch and diagonal branch, all done in [place]. He has had HIV since 1989 with undetectable viral load. His pulmonary fibrosis is followed in the pulmonary clinic. He has sleep apnea but does not use CPAP. He has cirrhosis and mild esophageal varices. Echo in 03/2023 showed EF 55% and severe AS with a mean grad 31 mmHg AVA 0.76 cm2, DVI 0.22, SVI 27. L/RHC 04/23/23 showed multivessel coronary artery disease with moderate nonobstructive disease with patent stents, except severe ISR in ostial diag stent. In absence of angina and proximal severe stenoses, medical therapy was recommended. S/p TAVR with a 26 mm Edwards Sapien 3 Ultra Resilia THV via the TF approach on 06/08/23. Post operative echo showed EF 65%, mild RV dysfunction/enlargement, normally functioning TAVR with a mean gradient of 17 mmHg and no PVL. Elevated gradients felt to be due to increased flow. He was discharged with a Zio cardiac monitor secondary to  baseline RBBB and LAFB.    Today the patient presents to clinic for follow up. Walks a mile almost everyday and sometimes SOB. When asked if feeling better since TAVR and says he "feels normal." Maybe has a little more energy. No CP or SOB. No LE edema, orthopnea or PND. No dizziness or syncope. No blood in stool or urine. Had some mild palpitations this AM that self resolved within 5 minutes.    Past Medical History:  Diagnosis Date   CAD (coronary artery disease)    Diabetes mellitus without complication (HCC)    Essential (primary) hypertension    Gallstones    Hepatic cirrhosis (HCC)    Hepatitis B core antibody positive 01/08/2019   Hepatitis C    HIV infection (HCC)    Hyperlipidemia    Hypertension    Internal hemorrhoids    Kidney stones    Pulmonary fibrosis (HCC)    S/P TAVR (transcatheter aortic valve replacement) 06/08/2023   s/p TAVR with a 26mm Edwards S3UR via the TF approach by Dr. Abel Hoe & Dr. Honey Lusty   Severe aortic stenosis    Sleep apnea    severe, on CPAP   Tubular adenoma of colon      Current Medications: Current Meds  Medication Sig   amLODipine  (NORVASC ) 10 MG tablet Take 1 tablet (10 mg total) by mouth daily.  atenolol  (TENORMIN ) 50 MG tablet TAKE 1 TABLET BY MOUTH EVERY DAY   bictegravir-emtricitabine -tenofovir  AF (BIKTARVY ) 50-200-25 MG TABS tablet Take 1 tablet by mouth daily.   cholecalciferol (VITAMIN D3) 25 MCG (1000 UNIT) tablet Take 1,000 Units by mouth daily.   clopidogrel  (PLAVIX ) 75 MG tablet TAKE 1 TABLET BY MOUTH EVERY DAY   HYDROcodone  bit-homatropine (HYCODAN ) 5-1.5 MG/5ML syrup Take 5 mLs by mouth every 6 (six) hours as needed (chronic cough - pulmonary fibrosis).   losartan -hydrochlorothiazide  (HYZAAR ) 100-25 MG tablet Take 1 tablet by mouth daily.   Multiple Vitamin (MULTIVITAMIN) tablet Take 1 tablet by mouth daily.   OFEV  100 MG CAPS TAKE 1 CAPSULE BY MOUTH 2 TIMES A DAY WITH FOOD. TAKE 12 HOURS APART.   Oxycodone  HCl 10 MG  TABS Take 10 mg by mouth 3 (three) times daily as needed (Pain).   rosuvastatin  (CRESTOR ) 10 MG tablet Take 1 tablet (10 mg total) by mouth daily. (Patient taking differently: Take 10 mg by mouth at bedtime.)   SYNJARDY  XR 12.07-998 MG TB24 Take 2 tablets by mouth daily. (Patient taking differently: Take 1 tablet by mouth 2 (two) times daily.)   VASCEPA  1 g capsule Take 2 capsules (2 g total) by mouth 2 (two) times daily.   [DISCONTINUED] amLODipine  (NORVASC ) 5 MG tablet Take 1 tablet (5 mg total) by mouth daily.      ROS:   Please see the history of present illness.    All other systems reviewed and are negative.  EKGs       Risk Assessment/Calculations:           Physical Exam:    VS:  BP (!) 158/74 (BP Location: Left Arm, Patient Position: Sitting, Cuff Size: Large)   Pulse 72   Resp 16   Ht 6' (1.829 m)   Wt 189 lb 12.8 oz (86.1 kg)   SpO2 95%   BMI 25.74 kg/m     Wt Readings from Last 3 Encounters:  07/12/23 189 lb 12.8 oz (86.1 kg)  06/17/23 194 lb 12.8 oz (88.4 kg)  06/09/23 200 lb 6.4 oz (90.9 kg)     GEN: Well nourished, well developed in no acute distress NECK: No JVD CARDIAC: RRR, no murmurs, rubs, gallops RESPIRATORY:  Clear to auscultation without rales, wheezing or rhonchi  ABDOMEN: Soft, non-tender, non-distended EXTREMITIES:  No edema; No deformity.   ASSESSMENT:    1. S/P TAVR (transcatheter aortic valve replacement)   2. Chronic heart failure with preserved ejection fraction (HCC)   3. Coronary artery disease involving native coronary artery of native heart without angina pectoris   4. Bifascicular bundle branch block   5. Essential hypertension   6. Hyperlipidemia LDL goal <70   7. ILD (interstitial lung disease) (HCC)   8. Lesion of liver   9. Amyloid heart disease (HCC)     PLAN:    In order of problems listed above:  Severe AS s/p TAVR:  -- Echo today shows EF 60%, mild concentric LVH, normally functioning TAVR with a mean  gradient of 15.3 mm hg and trivial PVL.  -- NYHA class II symptoms, but has chronic ILD. Mild increase in energy since TAVR. -- Continue chronic Plavix  75 mg daily.   -- SBE discussed. He has full dentures.  -- I will see back for 1 year office visit with echo.  Chronic HFpEF: -- Appears euvolemic.  -- Continue Hyzaar  100-25mg  daily.    CAD: -- [facility] 04/23/23 showed multivessel coronary artery disease  with moderate nonobstructive disease with patent stents, except severe ISR in ostial diag stent.  -- In absence of angina and proximal severe stenoses, medical therapy was recommended.  -- Continue Plavix  75mg  daily and Crestor  10mg  daily.   Bifasicular block: -- (RBBB, LAFB) at baseline.  -- Follow up Zio with no HAVB.   HTN: -- BP continues to be elevated.  -- Increase Norvasc  from 5mg  to 10 mg daily. -- Continue atenolol  50mg  daily and Hyzaar  100-25mg  daily.    HLD: -- Continue Crestor  10mg  at bedtime. -- Continue Vacepa 1g BID.    ILD: -- Followed by Mannam. -- Continue pulmonary rehab. -- Continue home 02.    Lesion of liver:  -- Pre TAVR CT showed "poorly characterized 3.2 cm hypodense lesion within the right hepatic lobe. This may represent a complex cyst or hyperdense mass. Contrast enhanced MRI examination is recommended for further characterization." -- This was discussed today and MRI ordered.    Elevated Raise Score: -- Increased risk for amyloid heart disease based on severe AS and RBBB.  -- Discussed today and wants to revisit in the future.      Cardiac Rehabilitation Eligibility Assessment  The patient is ready to start cardiac rehabilitation from a cardiac standpoint.     Medication Adjustments/Labs and Tests Ordered: Current medicines are reviewed at length with the patient today.  Concerns regarding medicines are outlined above.  Orders Placed This Encounter  Procedures   MR LIVER W CONTRAST   ECHOCARDIOGRAM COMPLETE   Meds ordered this encounter   Medications   amLODipine  (NORVASC ) 10 MG tablet    Sig: Take 1 tablet (10 mg total) by mouth daily.    Dispense:  180 tablet    Refill:  3    Patient Instructions  Medication Instructions:  Increase the Amlodipine  from 5mg  to 10mg . Take one tablet daily. This medication has been sent to your pharmacy.  *If you need a refill on your cardiac medications before your next appointment, please call your pharmacy*   Lab Work: No labs were ordered during today's visit.  If you have labs (blood work) drawn today and your tests are completely normal, you will receive your results only by: MyChart Message (if you have MyChart) OR A paper copy in the mail If you have any lab test that is abnormal or we need to change your treatment, we will call you to review the results.   Testing/Procedures: Your physician has requested that you have a Liver MRI. MRI uses a computer to create images of your liver producing both still and moving pictures. For further information please visit [email or web address]. Please follow the instruction sheet given to you today for more information.    Follow-Up: At [facility], you and your health needs are our priority.  As part of our continuing mission to provide you with exceptional heart care, we have created designated Provider Care Teams.  These Care Teams include your primary Cardiologist (physician) and Advanced Practice Providers (APPs -  Physician Assistants and Nurse Practitioners) who all work together to provide you with the care you need, when you need it.  We recommend signing up for the patient portal called "MyChart".  Sign up information is provided on this After Visit Summary.  MyChart is used to connect with patients for Virtual Visits (Telemedicine).  Patients are able to view lab/test results, encounter notes, upcoming appointments, etc.  Non-urgent messages can be sent to your provider as well.   To learn more  about what you can do with  MyChart, go to [email or web address].    Your next appointment:   Keep next scheduled appointment   Other Instructions HEART & VASCULAR CENTER  [address] [name], [place]  [address] OPENING APRIL 28,2025       1st Floor: - Lobby - Registration  - Pharmacy  - Lab - Cafe   2nd Floor: - PV Lab - Diagnostic Testing (echo, CT, nuclear med)   3rd Floor: - Vacant   4th Floor: - TCTS (cardiothoracic surgery) - AFib Clinic - Structural Heart Clinic - Vascular Surgery  - Vascular Ultrasound   5th Floor: - HeartCare Cardiology (general and EP) - Clinical Pharmacy for coumadin, hypertension, lipid, weight-loss medications, and med management appointments      Valet parking services will be available as well.           Signed, Abagail Hoar, PA-C  07/12/2023 2:25 PM    [facility]

## 2023-07-12 NOTE — Patient Instructions (Signed)
 Medication Instructions:  Increase the Amlodipine  from 5mg  to 10mg . Take one tablet daily. This medication has been sent to your pharmacy.  *If you need a refill on your cardiac medications before your next appointment, please call your pharmacy*   Lab Work: No labs were ordered during today's visit.  If you have labs (blood work) drawn today and your tests are completely normal, you will receive your results only by: MyChart Message (if you have MyChart) OR A paper copy in the mail If you have any lab test that is abnormal or we need to change your treatment, we will call you to review the results.   Testing/Procedures: Your physician has requested that you have a Liver MRI. MRI uses a computer to create images of your liver producing both still and moving pictures. For further information please visit [email or web address]. Please follow the instruction sheet given to you today for more information.    Follow-Up: At [facility], you and your health needs are our priority.  As part of our continuing mission to provide you with exceptional heart care, we have created designated Provider Care Teams.  These Care Teams include your primary Cardiologist (physician) and Advanced Practice Providers (APPs -  Physician Assistants and Nurse Practitioners) who all work together to provide you with the care you need, when you need it.  We recommend signing up for the patient portal called "MyChart".  Sign up information is provided on this After Visit Summary.  MyChart is used to connect with patients for Virtual Visits (Telemedicine).  Patients are able to view lab/test results, encounter notes, upcoming appointments, etc.  Non-urgent messages can be sent to your provider as well.   To learn more about what you can do with MyChart, go to [email or web address].    Your next appointment:   Keep next scheduled appointment   Other Instructions HEART & VASCULAR CENTER  [address]  [name], [place]  [address] OPENING APRIL 28,2025       1st Floor: - Lobby - Registration  - Pharmacy  - Lab - Cafe   2nd Floor: - PV Lab - Diagnostic Testing (echo, CT, nuclear med)   3rd Floor: - Vacant   4th Floor: - TCTS (cardiothoracic surgery) - AFib Clinic - Structural Heart Clinic - Vascular Surgery  - Vascular Ultrasound   5th Floor: - HeartCare Cardiology (general and EP) - Clinical Pharmacy for coumadin, hypertension, lipid, weight-loss medications, and med management appointments      Valet parking services will be available as well.

## 2023-07-12 NOTE — Telephone Encounter (Unsigned)
 Copied from CRM [id number]. Topic: Clinical - Medication Refill >> Jul 12, 2023  8:29 AM Hilton Lucky wrote: Most Recent Primary Care Visit:  Provider: BURNS, Beckey Bourgeois  Department: [facility]  Visit Type: OFFICE VISIT  Date: 12/10/2022  Medication: HYDROcodone  bit-homatropine (HYCODAN ) 5-1.5 MG/5ML syrup  Has the patient contacted their pharmacy? No   Is this the correct pharmacy for this prescription? Yes  This is the patient's preferred pharmacy:  [id number] [name], [place] - [address] [place] [address] Phone: [phone] Fax: [phone]    Has the prescription been filled recently? No  Is the patient out of the medication? Yes  Has the patient been seen for an appointment in the last year OR does the patient have an upcoming appointment? Yes  Can we respond through MyChart? No  Agent: Please be advised that Rx refills may take up to 3 business days. We ask that you follow-up with your pharmacy.

## 2023-07-12 NOTE — Telephone Encounter (Signed)
 Copied from CRM [id number]. Topic: Clinical - Medication Refill >> Jul 12, 2023  8:21 AM Roseanne Cones wrote: Most Recent Primary Care Visit:  Provider: BURNS, Beckey Bourgeois  Department: [facility]  Visit Type: OFFICE VISIT  Date: 12/10/2022  Medication: HYDROcodone  bit-homatropine (HYCODAN ) 5-1.5 MG/5ML syrup   Has the patient contacted their pharmacy? No (Agent: If no, request that the patient contact the pharmacy for the refill. If patient does not wish to contact the pharmacy document the reason why and proceed with request.) (Agent: If yes, when and what did the pharmacy advise?)  Is this the correct pharmacy for this prescription? No If no, delete pharmacy and type the correct one.  This is the patient's preferred pharmacy:  [id number] [name], [place] - [address] AT [facility] [address] [facility] [address] [place] [address] Phone: [phone] Fax: [phone]   Has the prescription been filled recently? No  Is the patient out of the medication? Yes  Has the patient been seen for an appointment in the last year OR does the patient have an upcoming appointment? Yes  Can we respond through MyChart? Yes  Agent: Please be advised that Rx refills may take up to 3 business days. We ask that you follow-up with your pharmacy.

## 2023-07-12 NOTE — Telephone Encounter (Signed)
 Chief Complaint: cough Symptoms: cough Frequency: chronic cough with pulmonary fibrosis  Pertinent Negatives: Patient denies CP, fever, difficulty breathing Disposition: [] ED /[] Urgent Care (no appt availability in office) / [] Appointment(In office/virtual)/ []  [place] [facility]/ [] Home Care/ [] Refused Recommended Disposition /[] [place] [facility]/ [x]  Follow-up with PCP Additional Notes: Pt called in for a refill of his hydrocodone  cough syrup. Refill request for Mannam (pt's pulm doctor) seen in chart review. Pt states his cough is worse since he has not had his medicine but he has no other symptoms. Pt states he just needs a refill. Pt denies chest pain, difficulty breathing, fever, N/V. States his sputum is clear. Pt states he has been coughing so much that his stomach hurts. RN advised pt to follow-up with his pharmacy in regards to a refill. RN advised pt if he has CP or difficulty breathing to call 911 and for other worsening he should call us  back.    Copied from CRM [id number]. Topic: Clinical - Red Word Triage >> Jul 12, 2023  8:19 AM Roseanne Cones wrote: [facility] that prompted transfer to Nurse Triage: increasingly worsening cough x 2-3 weeks. Patient called because he's run out of his HYDROcodone  bit-homatropine (HYCODAN ) 5-1.5 MG/5ML syrup. Patient of Dr. Isabel Many. Answer Assessment - Initial Assessment Questions 1. ONSET: "When did the cough begin?"      Chronic - pulmonary fibrosis   2. SEVERITY: "How bad is the cough today?"      "Cough so hard my stomach hurts" - denies vomiting 3. SPUTUM: "Describe the color of your sputum" (none, dry cough; clear, white, yellow, green)     Clear sputum intermittently  4. HEMOPTYSIS: "Are you coughing up any blood?" If so ask: "How much?" (flecks, streaks, tablespoons, etc.)     No fever  5. DIFFICULTY BREATHING: "Are you having difficulty breathing?" If Yes, ask: "How bad is it?" (e.g., mild, moderate, severe)    - MILD: No SOB at rest, mild  SOB with walking, speaks normally in sentences, can lie down, no retractions, pulse < 100.    - MODERATE: SOB at rest, SOB with minimal exertion and prefers to sit, cannot lie down flat, speaks in phrases, mild retractions, audible wheezing, pulse 100-120.    - SEVERE: Very SOB at rest, speaks in single words, struggling to breathe, sitting hunched forward, retractions, pulse > 120      No 6. FEVER: "Do you have a fever?" If Yes, ask: "What is your temperature, how was it measured, and when did it start?"     No fever  7. CARDIAC HISTORY: "Do you have any history of heart disease?" (e.g., heart attack, congestive heart failure)      CAD 8. LUNG HISTORY: "Do you have any history of lung disease?"  (e.g., pulmonary embolus, asthma, emphysema)     Pulmonary fibrosis  9. PE RISK FACTORS: "Do you have a history of blood clots?" (or: recent major surgery, recent prolonged travel, bedridden)     No 10. OTHER SYMPTOMS: "Do you have any other symptoms?" (e.g., runny nose, wheezing, chest pain)       No  Protocols used: Cough - Chronic-A-AH

## 2023-07-12 NOTE — Telephone Encounter (Addendum)
 Patient called back and was triaged by another RN. Closing chart.    Copied from CRM [id number]. Topic: Clinical - Red Word Triage >> Jul 12, 2023  8:19 AM Roseanne Cones wrote: [facility] that prompted transfer to Nurse Triage: increasingly worsening cough x 2-3 weeks. Patient called because he's run out of his HYDROcodone  bit-homatropine (HYCODAN ) 5-1.5 MG/5ML syrup. Patient of Dr. Isabel Many.

## 2023-07-12 NOTE — Telephone Encounter (Signed)
**Note De-identified  [place] Obfuscation** Please advise 

## 2023-07-13 ENCOUNTER — Telehealth: Payer: Self-pay | Admitting: Internal Medicine

## 2023-07-13 MED ORDER — HYDROCODONE BIT-HOMATROP MBR 5-1.5 MG/5ML PO SOLN: 5.0000 mL | Freq: Four times a day (QID) | ORAL | 0 refills | Status: DC | PRN

## 2023-07-13 NOTE — Telephone Encounter (Signed)
 Copied from CRM [id number]. Topic: Clinical - Prescription Issue >> Jul 13, 2023 11:09 AM Juliaette Ober wrote: Reason for CRM: hanna from [facility] pharmacy calling to advise docotr that patient is getting Oxycodone  HCl 10 MG TABS filled at [facility] pharmacy with a different doctor office every 7 days. Pharmacy concerned.

## 2023-07-14 ENCOUNTER — Encounter (HOSPITAL_COMMUNITY): Payer: Self-pay

## 2023-07-14 NOTE — Telephone Encounter (Signed)
 We do not prescribe any controlled substances for him it would be more helpful to notify prescribing providers.

## 2023-07-15 ENCOUNTER — Telehealth (HOSPITAL_COMMUNITY): Payer: Self-pay | Admitting: *Deleted

## 2023-07-15 DIAGNOSIS — Z01812 Encounter for preprocedural laboratory examination: Secondary | ICD-10-CM

## 2023-07-15 DIAGNOSIS — K769 Liver disease, unspecified: Secondary | ICD-10-CM

## 2023-07-15 DIAGNOSIS — E854 Organ-limited amyloidosis: Secondary | ICD-10-CM

## 2023-07-15 DIAGNOSIS — I2510 Atherosclerotic heart disease of native coronary artery without angina pectoris: Secondary | ICD-10-CM

## 2023-07-15 NOTE — Telephone Encounter (Signed)
 Called and spoke with patient. Informed him he would need labs prior to MRI on 4/28.  Patient will come to [facility] tomorrow 4/25 to have labs drawn. CBC and BMET ordered and released to [facility].

## 2023-07-15 NOTE — Telephone Encounter (Signed)
-----   Message from Abagail Hoar sent at 07/15/2023 11:04 AM EDT ----- Regarding: RE: use of oxygen during cardiac rehab Yes, it is fine to proceed with cardiac rehab with oxygen and use the parameters as outlined below. Thank you for the follow up.   Abagail Hoar PA-C  [facility] ----- Message ----- From: Nickie Barnes, RN Sent: 07/14/2023   3:11 PM EDT To: [name], PA-C Subject: use of oxygen during cardiac rehab             Good Day!  The above pt s/p 3/18 TAVR completed follow up with you on 4/21 would like to participate in Cardiac rehab. Medica history of pulmonary fibrosis with the use of oxygen therapy on exertion.  For our cardiac patients we do ask for an ok to use oxygen during exercise and to titrate to maintain the set  acceptable oxygen saturation.   Typically for our cardiac pt's we maintain maintain O2 saturation of 90% and above at rest and on exertion.  If appropriate for this pt, ok to titrate oxygen level to maintain this saturation?   Thanks so much for your response. This will serve as documentation and saved to the patient's chart.  Lettie Ray, BSN Cardiac and Emergency planning/management officer

## 2023-07-15 NOTE — Telephone Encounter (Signed)
 Medication sent in on 04/22

## 2023-07-16 ENCOUNTER — Ambulatory Visit: Payer: Self-pay | Admitting: Family

## 2023-07-16 ENCOUNTER — Encounter: Payer: Self-pay | Admitting: Family

## 2023-07-16 ENCOUNTER — Other Ambulatory Visit: Payer: Self-pay

## 2023-07-16 VITALS — BP 150/84 | HR 61 | Temp 98.0°F | Wt 191.8 lb

## 2023-07-16 DIAGNOSIS — R053 Chronic cough: Secondary | ICD-10-CM | POA: Diagnosis not present

## 2023-07-16 DIAGNOSIS — B20 Human immunodeficiency virus [HIV] disease: Secondary | ICD-10-CM | POA: Diagnosis not present

## 2023-07-16 DIAGNOSIS — I2510 Atherosclerotic heart disease of native coronary artery without angina pectoris: Secondary | ICD-10-CM | POA: Diagnosis not present

## 2023-07-16 DIAGNOSIS — Z0000 Encounter for general adult medical examination without abnormal findings: Secondary | ICD-10-CM

## 2023-07-16 DIAGNOSIS — E854 Organ-limited amyloidosis: Secondary | ICD-10-CM | POA: Diagnosis not present

## 2023-07-16 DIAGNOSIS — K769 Liver disease, unspecified: Secondary | ICD-10-CM | POA: Diagnosis not present

## 2023-07-16 DIAGNOSIS — Z9189 Other specified personal risk factors, not elsewhere classified: Secondary | ICD-10-CM | POA: Diagnosis not present

## 2023-07-16 DIAGNOSIS — Z01812 Encounter for preprocedural laboratory examination: Secondary | ICD-10-CM | POA: Diagnosis not present

## 2023-07-16 DIAGNOSIS — I43 Cardiomyopathy in diseases classified elsewhere: Secondary | ICD-10-CM | POA: Diagnosis not present

## 2023-07-16 NOTE — Progress Notes (Signed)
 Brief Narrative   Patient ID: Tyler Hendricks, male    DOB: 1951-01-21, 73 y.o.   MRN: [id number]  Tyler Hendricks is a 73 y/o caucasian male diagnosed with HIV disease in 1989 with risk factor being IV drug use. Initial viral load is unknown with CD4 nadir of 510. No history of opportunistic infection. [id number] negative. Previous ART history with Zerit/Stauvadine-Epivir, Atripla, Complera, Odefsey  and currently Biktarvy .     Subjective:    Chief Complaint  Patient presents with   Follow-up    B20    HPI:  Tyler Hendricks is a 73 y.o. male with HIV disease last seen on 01/01/2023 with well-controlled virus and good adherence and tolerance to Biktarvy .  Viral load was undetectable with CD4 count 907.  Kidney function, liver function, electrolytes within normal ranges.  Most recent lab work completed on 07/02/2023 with viral load that remains undetectable and CD4 count of 757.  RPR was nonreactive.  Status post transcatheter aortic valve replacement on 06/08/2023.  Noted to have a lesion on his liver which is being explored.  Here today for routine follow-up.  Tyler Hendricks has been doing okay since his last office visit and continues to take Biktarvy  as prescribed with no adverse side effects or problems obtaining medication from the pharmacy.  Covered by Medicare.  Has ongoing dry cough since his surgery in the setting of interstitial lung disease/fibrosis.  Having night sweats on occasion but not nightly.   Housing, access to food, and transportation are stable.  ASCVD risk of 40%.  Healthcare maintenance reviewed.  Condoms and site-specific STD testing offered.  Denies fevers, chills, night sweats, headaches, changes in vision, neck pain/stiffness, nausea, diarrhea, vomiting, lesions or rashes.  Lab Results  Component Value Date   CD4TCELL 58 07/02/2023   CD4TABS 789 11/24/2019   Lab Results  Component Value Date   HIV1RNAQUANT NOT DETECTED 07/02/2023     Allergies  Allergen Reactions    Integrilin [Eptifibatide] Other (See Comments)    Bleeding (non-specific)   Lopressor [Metoprolol Tartrate] Rash and Other (See Comments)    Bleeding (non-specific)    Penicillins Diarrhea and Rash      Outpatient Medications Prior to Visit  Medication Sig Dispense Refill   amLODipine  (NORVASC ) 10 MG tablet Take 1 tablet (10 mg total) by mouth daily. 180 tablet 3   atenolol  (TENORMIN ) 50 MG tablet TAKE 1 TABLET BY MOUTH EVERY DAY 90 tablet 3   bictegravir-emtricitabine -tenofovir  AF (BIKTARVY ) 50-200-25 MG TABS tablet Take 1 tablet by mouth daily. 30 tablet 11   cholecalciferol (VITAMIN D3) 25 MCG (1000 UNIT) tablet Take 1,000 Units by mouth daily.     clopidogrel  (PLAVIX ) 75 MG tablet TAKE 1 TABLET BY MOUTH EVERY DAY 90 tablet 3   HYDROcodone  bit-homatropine (HYCODAN ) 5-1.5 MG/5ML syrup Take 5 mLs by mouth every 6 (six) hours as needed (chronic cough - pulmonary fibrosis). 473 mL 0   losartan -hydrochlorothiazide  (HYZAAR ) 100-25 MG tablet Take 1 tablet by mouth daily. 90 tablet 3   Multiple Vitamin (MULTIVITAMIN) tablet Take 1 tablet by mouth daily.     OFEV  100 MG CAPS TAKE 1 CAPSULE BY MOUTH 2 TIMES A DAY WITH FOOD. TAKE 12 HOURS APART. 180 capsule 1   Oxycodone  HCl 10 MG TABS Take 10 mg by mouth 3 (three) times daily as needed (Pain).     rosuvastatin  (CRESTOR ) 10 MG tablet Take 1 tablet (10 mg total) by mouth daily. (Patient taking differently: Take 10 mg by mouth at bedtime.)  90 tablet 3   SYNJARDY  XR 12.07-998 MG TB24 Take 2 tablets by mouth daily. (Patient taking differently: Take 1 tablet by mouth 2 (two) times daily.) 180 tablet 3   VASCEPA  1 g capsule Take 2 capsules (2 g total) by mouth 2 (two) times daily. 360 capsule 3   No facility-administered medications prior to visit.     Past Medical History:  Diagnosis Date   CAD (coronary artery disease)    Diabetes mellitus without complication (HCC)    Essential (primary) hypertension    Gallstones    Hepatic cirrhosis (HCC)     Hepatitis B core antibody positive 01/08/2019   Hepatitis C    HIV infection (HCC)    Hyperlipidemia    Hypertension    Internal hemorrhoids    Kidney stones    Pulmonary fibrosis (HCC)    S/P TAVR (transcatheter aortic valve replacement) 06/08/2023   s/p TAVR with a 26mm Edwards S3UR via the TF approach by Dr. Abel Hoe & Dr. Honey Lusty   Severe aortic stenosis    Sleep apnea    severe, on CPAP   Tubular adenoma of colon      Past Surgical History:  Procedure Laterality Date   CARDIAC CATHETERIZATION Left 04/2013   chlecystectomy     CHOLECYSTECTOMY     COLONOSCOPY  05/02/2013   GAS/FLUID EXCHANGE Right 12/30/2021   Procedure: GAS/FLUID EXCHANGE;  Surgeon: Jearline Minder, MD;  Location: [facility] OR;  Service: Ophthalmology;  Laterality: Right;  SF6   INTRAOPERATIVE TRANSTHORACIC ECHOCARDIOGRAM N/A 06/08/2023   Procedure: ECHOCARDIOGRAM, TRANSTHORACIC;  Surgeon: Odie Benne, MD;  Location: [facility] INVASIVE CV LAB;  Service: Cardiovascular;  Laterality: N/A;   PARS PLANA VITRECTOMY Right 12/30/2021   Procedure: PARS PLANA VITRECTOMY WITH 25 GAUGE;  Surgeon: Jearline Minder, MD;  Location: [facility] OR;  Service: Ophthalmology;  Laterality: Right;   PERCUTANEOUS CORONARY STENT INTERVENTION (PCI-S)     PHOTOCOAGULATION WITH LASER Right 12/30/2021   Procedure: PHOTOCOAGULATION WITH LASER;  Surgeon: Jearline Minder, MD;  Location: [facility] OR;  Service: Ophthalmology;  Laterality: Right;   POLYPECTOMY     RIGHT/LEFT HEART CATH AND CORONARY ANGIOGRAPHY N/A 04/23/2023   Procedure: RIGHT/LEFT HEART CATH AND CORONARY ANGIOGRAPHY;  Surgeon: Cody Das, MD;  Location: [facility] INVASIVE CV LAB;  Service: Cardiovascular;  Laterality: N/A;   UMBILICAL HERNIA REPAIR        Review of Systems  Constitutional:  Negative for appetite change, chills, fatigue, fever and unexpected weight change.  Eyes:  Negative for visual disturbance.  Respiratory:  Positive for cough. Negative for chest tightness,  shortness of breath and wheezing.   Cardiovascular:  Negative for chest pain and leg swelling.  Gastrointestinal:  Negative for abdominal pain, constipation, diarrhea, nausea and vomiting.  Genitourinary:  Negative for dysuria, flank pain, frequency, genital sores, hematuria and urgency.  Skin:  Negative for rash.  Allergic/Immunologic: Negative for immunocompromised state.  Neurological:  Negative for dizziness and headaches.      Objective:    BP (!) 150/84   Pulse 61   Temp 98 F (36.7 C) (Oral)   Wt 191 lb 12.8 oz (87 kg)   SpO2 98%   BMI 26.01 kg/m  Nursing note and vital signs reviewed.  Physical Exam Constitutional:      General: He is not in acute distress.    Appearance: He is well-developed.  Eyes:     Conjunctiva/sclera: Conjunctivae normal.  Cardiovascular:     Rate and Rhythm: Normal rate and regular rhythm.  Heart sounds: Normal heart sounds. No murmur heard.    No friction rub. No gallop.  Pulmonary:     Effort: Pulmonary effort is normal. No respiratory distress.     Breath sounds: Normal breath sounds. No wheezing or rales.  Chest:     Chest wall: No tenderness.  Abdominal:     General: Bowel sounds are normal.     Palpations: Abdomen is soft.     Tenderness: There is no abdominal tenderness.  Musculoskeletal:     Cervical back: Neck supple.  Lymphadenopathy:     Cervical: No cervical adenopathy.  Skin:    General: Skin is warm and dry.     Findings: No rash.  Neurological:     Mental Status: He is alert and oriented to person, place, and time.  Psychiatric:        Mood and Affect: Mood normal.         07/16/2023   10:40 AM 04/14/2023   10:36 AM 01/01/2023    9:30 AM 07/10/2022    9:29 AM 05/06/2022    1:38 PM  Depression screen PHQ 2/9  Decreased Interest 0 0 0 0 0  Down, Depressed, Hopeless 0 0 0 0 0  PHQ - 2 Score 0 0 0 0 0  Altered sleeping 0 0     Tired, decreased energy 0 0     Change in appetite 0 0     Feeling bad or failure  about yourself  0 0     Trouble concentrating 0 0     Moving slowly or fidgety/restless 0 0     Suicidal thoughts 0 0     PHQ-9 Score 0 0     Difficult doing work/chores Somewhat difficult Not difficult at all           07/16/2023   10:41 AM  GAD 7 : Generalized Anxiety Score  Nervous, Anxious, on Edge 0  Control/stop worrying 0  Worry too much - different things 0  Trouble relaxing 0  Restless 0  Easily annoyed or irritable 0  Afraid - awful might happen 0  Total GAD 7 Score 0  Anxiety Difficulty Somewhat difficult     The 10-year ASCVD risk score (Arnett DK, et al., 2019) is: 40.2%   Values used to calculate the score:     Age: 41 years     Sex: Male     Is Non-Hispanic African American: No     Diabetic: Yes     Tobacco smoker: No     Systolic Blood Pressure: 150 mmHg     Is BP treated: Yes     HDL Cholesterol: 70.2 mg/dL     Total Cholesterol: 142 mg/dL      Assessment & Plan:    Patient Active Problem List   Diagnosis Date Noted   At increased risk for cardiovascular disease 07/16/2023   S/P TAVR (transcatheter aortic valve replacement) 06/08/2023   Acute on chronic heart failure with preserved ejection fraction (HFpEF) (HCC) 06/08/2023   Hepatitis C    Severe aortic stenosis 02/15/2023   Pain of finger of left hand 01/01/2023   Pain due to onychomycosis of toenails of both feet 10/08/2022   Degeneration of lumbar intervertebral disc 02/18/2022   Long-term current use of opiate analgesic 06/11/2020   Lumbar pain 04/24/2020   Degenerative lumbar spinal stenosis 04/24/2020   Scoliosis deformity of spine 04/24/2020   Neural foraminal stenosis of cervical spine 01/03/2020   Chronic chest  wall pain 11/16/2019   Hyperlipidemia associated with type 2 diabetes mellitus (HCC) 11/15/2019   Benign prostatic hyperplasia without lower urinary tract symptoms 11/15/2019   Interstitial pulmonary disease (HCC) 07/18/2019   Chronic bilateral low back pain without sciatica  03/02/2019   Muscle cramps 03/02/2019   Hepatic cirrhosis (HCC) 01/08/2019   Secondary esophageal varices without bleeding (HCC) 01/08/2019   Intestinal metaplasia of gastric mucosa 01/08/2019   Chronic cough 06/24/2018   Healthcare maintenance 05/11/2018   Hx of colonic polyp 04/27/2018   Ventral hernia without obstruction or gangrene 04/27/2018   Thrombocytopenia (HCC) 04/27/2018   Hypersplenism 04/27/2018   Coronary artery disease of native artery of native heart with stable angina pectoris (HCC) 02/25/2018   Obstructive sleep apnea 10/25/2017   Extrinsic asthma without complication 08/25/2017   Vitamin D  deficiency 08/25/2017   HIV disease (HCC) 04/15/2017   Primary osteoarthritis of both knees 04/15/2017   Type 2 diabetes with complication (HCC) 04/15/2017   Essential hypertension 04/15/2017     Problem List Items Addressed This Visit       Other   HIV disease (HCC) - Primary   Mr. Louque continues to have well controlled virus with good adherence and tolerance to Biktarvy .  Reviewed lab work and discussed plan of care and U equals U. Social determinants of health reviewed and no interventions indicated.  No problems obtaining medication from the pharmacy and covered by Medicare.  Check lab work. Continue current dose of Biktarvy . Plan for follow up in  6 months or sooner if needed with lab work 1-2 weeks prior to appointment. .       Healthcare maintenance   Discussed importance of safe sexual practice and condom use. Condoms and site specific STD testing offered.  Vaccinations reviewed and up to date.  Routine dental care up to date.  Colon cancer screening up to date.       Chronic cough   Mr. Selvey has a chronic cough likely related to his interstitial lung disease/fibrosis.  No signs of active infection.  Night sweats unclear and possibly related to liver mass which is currently being evaluated.  No indication for antibiotics.      At increased risk for  cardiovascular disease   Mr. Utter is at increased risk for cardiovascular disease with ASCVD score of 40.2.  Currently on rosuvastatin .  This will help to decrease risk for cardiovascular disease as well as HIV associated inflammation.  Continue to work on modifiable risk factors.        I am having Adelita Honer maintain his multivitamin, Oxycodone  HCl, atenolol , losartan -hydrochlorothiazide , rosuvastatin , Synjardy  XR, Vascepa , clopidogrel , Biktarvy , Ofev , cholecalciferol, HYDROcodone  bit-homatropine, and amLODipine .   No orders of the defined types were placed in this encounter.    Follow-up: Return in about 6 months (around 01/15/2024). or sooner if needed.    Marlan Silva, MSN, FNP-C Nurse Practitioner [facility] Main number: [phone]

## 2023-07-16 NOTE — Assessment & Plan Note (Signed)
 Tyler Hendricks continues to have well controlled virus with good adherence and tolerance to Biktarvy .  Reviewed lab work and discussed plan of care and U equals U. Social determinants of health reviewed and no interventions indicated.  No problems obtaining medication from the pharmacy and covered by Medicare.  Check lab work. Continue current dose of Biktarvy . Plan for follow up in  6 months or sooner if needed with lab work 1-2 weeks prior to appointment. Aaron Aas

## 2023-07-16 NOTE — Assessment & Plan Note (Signed)
 Discussed importance of safe sexual practice and condom use. Condoms and site specific STD testing offered.  Vaccinations reviewed and up to date.  Routine dental care up to date.  Colon cancer screening up to date.

## 2023-07-16 NOTE — Assessment & Plan Note (Signed)
 [name] is at increased risk for cardiovascular disease with ASCVD score of 40.2.  Currently on rosuvastatin .  This will help to decrease risk for cardiovascular disease as well as HIV associated inflammation.  Continue to work on modifiable risk factors.

## 2023-07-16 NOTE — Assessment & Plan Note (Signed)
 Tyler Hendricks has a chronic cough likely related to his interstitial lung disease/fibrosis.  No signs of active infection.  Night sweats unclear and possibly related to liver mass which is currently being evaluated.  No indication for antibiotics.

## 2023-07-16 NOTE — Patient Instructions (Signed)
 Nice to see you.  Continue to take your medication daily as prescribed.  Refills have been sent to the pharmacy.  Plan for follow up in 6 months or sooner if needed with lab work 1-2 weeks prior to appointment.   Have a great day and stay safe!

## 2023-07-19 ENCOUNTER — Encounter (HOSPITAL_COMMUNITY): Payer: Self-pay

## 2023-07-19 ENCOUNTER — Ambulatory Visit (HOSPITAL_COMMUNITY)
Admission: RE | Admit: 2023-07-19 | Discharge: 2023-07-19 | Disposition: A | Discharge status: Home / Self Care | Source: Ambulatory Visit | Attending: Physician Assistant | Admitting: Physician Assistant

## 2023-07-19 ENCOUNTER — Other Ambulatory Visit: Payer: Self-pay | Admitting: Physician Assistant

## 2023-07-19 DIAGNOSIS — Z952 Presence of prosthetic heart valve: Secondary | ICD-10-CM

## 2023-07-19 DIAGNOSIS — E785 Hyperlipidemia, unspecified: Secondary | ICD-10-CM

## 2023-07-19 DIAGNOSIS — I10 Essential (primary) hypertension: Secondary | ICD-10-CM

## 2023-07-19 DIAGNOSIS — I5032 Chronic diastolic (congestive) heart failure: Secondary | ICD-10-CM

## 2023-07-19 DIAGNOSIS — J849 Interstitial pulmonary disease, unspecified: Secondary | ICD-10-CM

## 2023-07-19 DIAGNOSIS — K769 Liver disease, unspecified: Secondary | ICD-10-CM

## 2023-07-19 DIAGNOSIS — I452 Bifascicular block: Secondary | ICD-10-CM

## 2023-07-19 DIAGNOSIS — E854 Organ-limited amyloidosis: Secondary | ICD-10-CM

## 2023-07-19 DIAGNOSIS — I2510 Atherosclerotic heart disease of native coronary artery without angina pectoris: Secondary | ICD-10-CM

## 2023-07-19 LAB — CBC
Hematocrit: 47.4 % (ref 37.5–51.0)
Hemoglobin: 16.8 g/dL (ref 13.0–17.7)
MCH: 34.5 pg — ABNORMAL HIGH (ref 26.6–33.0)
MCHC: 35.4 g/dL (ref 31.5–35.7)
MCV: 97 fL (ref 79–97)
Platelets: 84 10*3/uL — CL (ref 150–450)
RBC: 4.87 x10E6/uL (ref 4.14–5.80)
RDW: 13.5 % (ref 11.6–15.4)
WBC: 5.1 10*3/uL (ref 3.4–10.8)

## 2023-07-19 LAB — BASIC METABOLIC PANEL WITH GFR
BUN/Creatinine Ratio: 22 (ref 10–24)
BUN: 14 mg/dL (ref 8–27)
CO2: 24 mmol/L (ref 20–29)
Calcium: 9.3 mg/dL (ref 8.6–10.2)
Chloride: 102 mmol/L (ref 96–106)
Creatinine, Ser: 0.64 mg/dL — ABNORMAL LOW (ref 0.76–1.27)
Glucose: 210 mg/dL — ABNORMAL HIGH (ref 70–99)
Potassium: 4.3 mmol/L (ref 3.5–5.2)
Sodium: 140 mmol/L (ref 134–144)
eGFR: 101 mL/min/{1.73_m2} (ref >59)

## 2023-07-19 MED ORDER — GADOBUTROL 1 MMOL/ML IV SOLN: 9.0000 mL | Freq: Once | INTRAVENOUS | Status: DC | PRN

## 2023-07-19 NOTE — Telephone Encounter (Signed)
 We discussed testing for amyloid heart disease. He has an increased risk for this due to his aortic stenosis, neuropathy and underlying conduction disease. He did not want to proceed with testing at the time of our discussion. If he has changed his mind, we would need to order a SPEP, UPEP and PYP scan. It will look for the abnormal protein in urine, blood and heart.   Thanks,  Kathyrn Thompson PA-C

## 2023-07-20 ENCOUNTER — Other Ambulatory Visit: Payer: Self-pay | Admitting: Physician Assistant

## 2023-07-20 ENCOUNTER — Encounter: Payer: Self-pay | Admitting: Adult Health

## 2023-07-20 ENCOUNTER — Ambulatory Visit: Admitting: Adult Health

## 2023-07-20 VITALS — BP 128/70 | HR 69 | Temp 97.9°F | Ht 73.0 in | Wt 195.4 lb

## 2023-07-20 DIAGNOSIS — J84112 Idiopathic pulmonary fibrosis: Secondary | ICD-10-CM | POA: Diagnosis not present

## 2023-07-20 DIAGNOSIS — J9611 Chronic respiratory failure with hypoxia: Secondary | ICD-10-CM

## 2023-07-20 DIAGNOSIS — J849 Interstitial pulmonary disease, unspecified: Secondary | ICD-10-CM

## 2023-07-20 MED ORDER — DIAZEPAM 10 MG PO TABS: 10.0000 mg | ORAL_TABLET | ORAL | 0 refills | Status: DC

## 2023-07-20 NOTE — Patient Instructions (Addendum)
 Continue on OFEV  100mg  Twice daily   Activity as tolerated  Delsym 2 tsp Twice daily  for cough /congestion  Oxygen 2l/m with activity to keep O2 sats >88-90% Order for POC O2 device.  Restart CPAP At bedtime  . Wear all night long for at least 6hr or more .  Follow up with Dr Waylan Haggard in 3-4 months and As needed

## 2023-07-20 NOTE — Progress Notes (Signed)
 @Patient  ID: Tyler Hendricks, male    DOB: May 14, 1950, 73 y.o.   MRN: [id number]  Chief Complaint  Patient presents with   Follow-up    Referring provider: Adelia Homestead, *  HPI: 73 yo male followed for IPF (Probable UIP pattern on CT ) and OSA -CPAP non compliant and Chronic Respiratory Failure on O2 with activity  Medical history significant for HIV, Hep C and Cirrhosis   TEST/EVENTS :  ets: Has a dog. No cats, birds, farm animals Occupation: Retired Psychologist, forensic Exposures: No known exposures. No mold, hot tub, Jacuzzi. No down pillows or comforters ILD questionnaire 10/03/2019-negative Smoking history: 60-pack-year smoker. Quit in late 1990s Travel history: Originally from [place] . Moved to [place]  in 2018 Relevant family history: Mother had emphysema. She was a heavy smoker.   Imaging: High-res CT 07/13/2019-groundglass attenuation with septal thickening, cylindrical bronchiectasis with mild basal gradient Probable UIP pattern High-res CT 06/24/2020-stable pulmonary fibrosis and probable UIP pattern High-res CT 04/21/2022-stable pattern of probable UIP pattern pulmonary fibrosis High-res CT 03/25/2023-mild progression of probable UIP pattern pulmonary fibrosis    PFTs: 09/01/2019 FVC 3.78 [78%], FEV1 3.14 [87%], F/F 83, TLC 6.40 [86%], DLCO 22.76 [81%] Normal test   06/28/2020 FVC 3.70 [71%], FEV1 3.05 [85%], F/F 82, TLC 5.76 [79%], DLCO 19.32 [69%] Minimal restriction, mild diffusion defect   07/22/2021 FVC 3.70 [1 7%], FEV1 3.01 [85%], F/F 81, TLC 5.84 [78%], DLCO 15.89 [57%] Minimal restriction, moderate diffusion defect   04/17/2022 FVC 3.69 [77%], FEV1 2.86 [82%], F/F78, TLC 5.49 [73%], DLCO 14.25 [52%] Mild restriction, moderate diffusion defect   03/25/2023 FVC 3.61 [76%], FEV1 2.89 [83%], F/F80, TLC 4.62 (2%], DLCO 15.22 [55%] Mild restriction, moderate diffusion defect   Labs: CTD serologies 08/30/2019-positive for ANA 1:40,  nuclear, speckled   Hepatic panel 12/30/2021-significant for AST 42, total bilirubin 4.3   Sleep: CPAP download 11/25/2019-2% compliance   Cardiac: Echocardiogram 09/06/2020-LVEF 60 to 65%, normal RV systolic size and function, TAPSE 1.5 cm  07/20/2023 Follow up : IPF, Chronic cough  Discussed the use of AI scribe software for clinical note transcription with the patient, who gave verbal consent to proceed.  History of Present Illness   Tyler Hendricks is a 73 year old male former smoker followed for pulmonary fibrosis (CT chest with probable UIP pattern), presents for a 83-month checkup.  Patient is on oxygen at home 2 L with activity.  He rarely uses his oxygen,  only when he feels is necessary, such as after walking his dog. He typically walks his dog twice a day for about a mile and a half without oxygen, but sometimes needs to use it upon returning home. Needs a portable system so he can be more active.  Walk test in the office as follows :  Patient Saturations on Room Air at Rest = 97%   Patient Saturations on Room Air while Ambulating = 85%   Patient Saturations on 2 Liters of continuous oxygen and 3L of pulsed oxygen on POC while Ambulating = 92%   Please briefly explain why patient needs home oxygen:  Requires 2L continuous oxygen and 3L of pulsed oxygen on POC with exertion to keep sats >88-90%.   He is currently on Ofev  100 mg twice daily. He experiences decreased appetite as the main side effect. No diarrhea or vomiting. His weight has been stable, fluctuating slightly but remaining around the 190s. He is on a lower dose of Ofev  and has not experienced significant  weight loss.   He has aortic valve replacement performed in March. The procedure went well, and he has been cleared for cardiac rehabilitation, although he has not yet started the program.    He has sleep apnea but has not been using his CPAP machine.  He said that he is going to restart his CPAP use.  He is going to  the DME company to have his machine checked and get a new CPAP mask.  Patient has a history of chronic cough felt secondary to his pulmonary fibrosis.  Takes Hycodan  cough syrup periodically.  Last visit was given a trial of gabapentin . He discontinued it due to the burden of taking multiple medications. . No coughing up blood or fevers.  No discolored mucus     He is followed by infectious disease with undetectable viral load on Biktarvy .   Allergies  Allergen Reactions   Integrilin [Eptifibatide] Other (See Comments)    Bleeding (non-specific)   Lopressor [Metoprolol Tartrate] Rash and Other (See Comments)    Bleeding (non-specific)    Penicillins Diarrhea and Rash    Immunization History  Administered Date(s) Administered   Fluad Quad(high Dose 65+) 11/17/2018, 12/08/2019, 12/19/2021   Fluad Trivalent(High Dose 65+) 12/18/2022   Influenza, High Dose Seasonal PF 12/20/2017, 11/17/2018, 12/08/2020   PFIZER Comirnaty(Gray Top)Covid-19 Tri-Sucrose Vaccine 12/26/2021   PFIZER(Purple Top)SARS-COV-2 Vaccination 05/18/2019, 06/14/2019, 12/22/2019, 08/12/2020   PNEUMOCOCCAL CONJUGATE-20 07/10/2022   Pfizer(Comirnaty)Fall Seasonal Vaccine 12 years and older 12/18/2022   Respiratory Syncytial Virus Vaccine,Recomb Aduvanted(Arexvy) 02/11/2022   Tdap 09/07/2018   Zoster Recombinant(Shingrix) 11/21/2016, 01/06/2017    Past Medical History:  Diagnosis Date   CAD (coronary artery disease)    Diabetes mellitus without complication (HCC)    Essential (primary) hypertension    Gallstones    Hepatic cirrhosis (HCC)    Hepatitis B core antibody positive 01/08/2019   Hepatitis C    HIV infection (HCC)    Hyperlipidemia    Hypertension    Internal hemorrhoids    Kidney stones    Pulmonary fibrosis (HCC)    S/P TAVR (transcatheter aortic valve replacement) 06/08/2023   s/p TAVR with a 26mm Edwards S3UR via the TF approach by Dr. Abel Hoe & Dr. Honey Lusty   Severe aortic stenosis    Sleep apnea     severe, on CPAP   Tubular adenoma of colon     Tobacco History: Social History   Tobacco Use  Smoking Status Former   Current packs/day: 0.00   Average packs/day: 2.0 packs/day for 20.0 years (40.0 ttl pk-yrs)   Types: Cigarettes   Start date: 58   Quit date: 1994   Years since quitting: 31.3   Passive exposure: Past  Smokeless Tobacco Never   Counseling given: Not Answered   Outpatient Medications Prior to Visit  Medication Sig Dispense Refill   amLODipine  (NORVASC ) 10 MG tablet Take 1 tablet (10 mg total) by mouth daily. 180 tablet 3   atenolol  (TENORMIN ) 50 MG tablet TAKE 1 TABLET BY MOUTH EVERY DAY 90 tablet 3   bictegravir-emtricitabine -tenofovir  AF (BIKTARVY ) 50-200-25 MG TABS tablet Take 1 tablet by mouth daily. 30 tablet 11   cholecalciferol (VITAMIN D3) 25 MCG (1000 UNIT) tablet Take 1,000 Units by mouth daily.     clopidogrel  (PLAVIX ) 75 MG tablet TAKE 1 TABLET BY MOUTH EVERY DAY 90 tablet 3   HYDROcodone  bit-homatropine (HYCODAN ) 5-1.5 MG/5ML syrup Take 5 mLs by mouth every 6 (six) hours as needed (chronic cough - pulmonary fibrosis). 473 mL  0   losartan -hydrochlorothiazide  (HYZAAR ) 100-25 MG tablet Take 1 tablet by mouth daily. 90 tablet 3   Multiple Vitamin (MULTIVITAMIN) tablet Take 1 tablet by mouth daily.     OFEV  100 MG CAPS TAKE 1 CAPSULE BY MOUTH 2 TIMES A DAY WITH FOOD. TAKE 12 HOURS APART. 180 capsule 1   Oxycodone  HCl 10 MG TABS Take 10 mg by mouth 3 (three) times daily as needed (Pain).     rosuvastatin  (CRESTOR ) 10 MG tablet Take 1 tablet (10 mg total) by mouth daily. (Patient taking differently: Take 10 mg by mouth at bedtime.) 90 tablet 3   SYNJARDY  XR 12.07-998 MG TB24 Take 2 tablets by mouth daily. (Patient taking differently: Take 1 tablet by mouth 2 (two) times daily.) 180 tablet 3   VASCEPA  1 g capsule Take 2 capsules (2 g total) by mouth 2 (two) times daily. 360 capsule 3   No facility-administered medications prior to visit.     Review  of Systems:   Constitutional:   No  weight loss, night sweats,  Fevers, chills, fatigue, or  lassitude.  HEENT:   No headaches,  Difficulty swallowing,  Tooth/dental problems, or  Sore throat,                No sneezing, itching, ear ache, nasal congestion, post nasal drip,   CV:  No chest pain,  Orthopnea, PND, +swelling in lower extremities, anasarca, dizziness, palpitations, syncope.   GI  No heartburn, indigestion, abdominal pain, nausea, vomiting, diarrhea, change in bowel habits, loss of appetite, bloody stools.   Resp: No shortness of breath with exertion or at rest.  No excess mucus, no productive cough,  No non-productive cough,  No coughing up of blood.  No change in color of mucus.  No wheezing.  No chest wall deformity  Skin: no rash or lesions.  GU: no dysuria, change in color of urine, no urgency or frequency.  No flank pain, no hematuria   MS:  No joint pain or swelling.  No decreased range of motion.  No back pain.    Physical Exam  BP 128/70 (BP Location: Left Arm, Patient Position: Sitting, Cuff Size: Normal)   Pulse 69   Temp 97.9 F (36.6 C) (Oral)   Ht 6\' 1"  (1.854 m)   Wt 195 lb 6.4 oz (88.6 kg)   SpO2 (!) 87% Comment: RA with exertion  BMI 25.78 kg/m   GEN: A/Ox3; pleasant , NAD, well nourished    HEENT:  [place]/AT,  EACs-clear, TMs-wnl, NOSE-clear, THROAT-clear, no lesions, no postnasal drip or exudate noted.   NECK:  Supple w/ fair ROM; no JVD; normal carotid impulses w/o bruits; no thyromegaly or nodules palpated; no lymphadenopathy.    RESP  Faint bibasilar crackles no accessory muscle use, no dullness to percussion  CARD:  RRR, no m/r/g, tr -1 +peripheral edema, pulses intact, no cyanosis or clubbing.  GI:   Soft & nt; nml bowel sounds; no organomegaly or masses detected.   Musco: Warm bil, no deformities or joint swelling noted.   Neuro: alert, no focal deficits noted.    Skin: Warm, no lesions or rashes    Lab  Results:  CBC    BNP No results found for: "BNP"  ProBNP  Imaging: LONG TERM MONITOR-LIVE TELEMETRY (3-14 DAYS) Result Date: 07/12/2023 Patch Wear Time:  14 days and 0 hours (2025-03-19T10:44:25-0400 to 2025-04-02T10:44:25-0400) Sinus rhythm.  (minimum HR of 50 bpm, max HR of 255 bpm, and avg HR of 69  bpm). Bundle Branch Block/IVCD was present. 5 Ventricular Tachycardia runs occurred, the longest was 6 beats. 25 Supraventricular Tachycardia runs occurred, the longest run was 22.4 seconds. Isolated SVEs were rare (<1.0%), SVE Couplets were rare (<1.0%), and SVE Triplets were rare (<1.0%). Isolated VEs were occasional (2.4%, [id number]), VE Couplets were rare (<1.0%, 716), and VE Triplets were rare (<1.0%, 19). Ventricular Bigeminy and Trigeminy were present.   ECHOCARDIOGRAM COMPLETE Result Date: 07/12/2023    ECHOCARDIOGRAM REPORT   Patient Name:   Tyler Hendricks  Date of Exam: 07/12/2023 Medical Rec #:  [id number]     Height:       72.0 in Accession #:    [id number]    Weight:       194.8 lb Date of Birth:  1950/06/30     BSA:          2.107 m Patient Age:    72 years      BP:           162/80 mmHg Patient Gender: M             HR:           75 bpm. Exam Location:  [address] Procedure: 2D Echo, Cardiac Doppler and Color Doppler (Both Spectral and Color            Flow Doppler were utilized during procedure). Indications:    s/p Tavr V43.3/z95.2  History:        Patient has prior history of Echocardiogram examinations. CHF,                 CAD; Risk Factors:Hypertension.                 Aortic Valve: 26 mm Sapien prosthetic, stented (TAVR) valve is                 present in the aortic position. Procedure Date: 06/08/2023.  Sonographer:    Alicia Inoue Referring Phys: [id number] KATHRYN R THOMPSON IMPRESSIONS  1. 26 mm S3. Gradients are stable. Trivial PVL. The aortic valve has been repaired/replaced. Aortic valve regurgitation is trivial. There is a 26 mm Sapien prosthetic (TAVR) valve present in the  aortic position. Procedure Date: 06/08/2023. Echo findings are consistent with normal structure and function of the aortic valve prosthesis. Aortic valve area, by VTI measures 1.78 cm. Aortic valve mean gradient measures 15.3 mmHg. Aortic valve Vmax measures 2.68 m/s.  2. Left ventricular ejection fraction, by estimation, is 60 to 65%. The left ventricle has normal function. The left ventricle has no regional wall motion abnormalities. There is mild concentric left ventricular hypertrophy. Left ventricular diastolic parameters are consistent with Grade I diastolic dysfunction (impaired relaxation).  3. Right ventricular systolic function is normal. The right ventricular size is normal. Tricuspid regurgitation signal is inadequate for assessing PA pressure.  4. The mitral valve is grossly normal. Trivial mitral valve regurgitation. No evidence of mitral stenosis.  5. The inferior vena cava is normal in size with greater than 50% respiratory variability, suggesting right atrial pressure of 3 mmHg. FINDINGS  Left Ventricle: Left ventricular ejection fraction, by estimation, is 60 to 65%. The left ventricle has normal function. The left ventricle has no regional wall motion abnormalities. The left ventricular internal cavity size was normal in size. There is  mild concentric left ventricular hypertrophy. Left ventricular diastolic parameters are consistent with Grade I diastolic dysfunction (impaired relaxation). Right Ventricle: The right ventricular size is normal. No increase in right  ventricular wall thickness. Right ventricular systolic function is normal. Tricuspid regurgitation signal is inadequate for assessing PA pressure. Left Atrium: Left atrial size was normal in size. Right Atrium: Right atrial size was normal in size. Pericardium: Trivial pericardial effusion is present. Mitral Valve: The mitral valve is grossly normal. Trivial mitral valve regurgitation. No evidence of mitral valve stenosis. Tricuspid  Valve: The tricuspid valve is grossly normal. Tricuspid valve regurgitation is trivial. No evidence of tricuspid stenosis. Aortic Valve: 26 mm S3. Gradients are stable. Trivial PVL. The aortic valve has been repaired/replaced. Aortic valve regurgitation is trivial. Aortic valve mean gradient measures 15.3 mmHg. Aortic valve peak gradient measures 28.7 mmHg. Aortic valve area, by VTI measures 1.78 cm. There is a 26 mm Sapien prosthetic, stented (TAVR) valve present in the aortic position. Procedure Date: 06/08/2023. Echo findings are consistent with normal structure and function of the aortic valve prosthesis. Pulmonic Valve: The pulmonic valve was grossly normal. Pulmonic valve regurgitation is trivial. No evidence of pulmonic stenosis. Aorta: The aortic root is normal in size and structure. Venous: The right lower pulmonary vein is normal. The inferior vena cava is normal in size with greater than 50% respiratory variability, suggesting right atrial pressure of 3 mmHg. IAS/Shunts: The atrial septum is grossly normal.  LEFT VENTRICLE PLAX 2D LVIDd:         4.87 cm      Diastology LVIDs:         2.95 cm      LV e' medial:    7.51 cm/s LV PW:         1.25 cm      LV E/e' medial:  9.3 LV IVS:        1.14 cm      LV e' lateral:   7.07 cm/s LVOT diam:     2.32 cm      LV E/e' lateral: 9.9 LV SV:         86 LV SV Index:   41 LVOT Area:     4.23 cm  LV Volumes (MOD) LV vol d, MOD A2C: 87.5 ml LV vol d, MOD A4C: 143.0 ml LV vol s, MOD A2C: 30.1 ml LV vol s, MOD A4C: 38.9 ml LV SV MOD A2C:     57.4 ml LV SV MOD A4C:     143.0 ml LV SV MOD BP:      80.9 ml RIGHT VENTRICLE             IVC RV Basal diam:  4.72 cm     IVC diam: 1.70 cm RV Mid diam:    3.76 cm RV S prime:     18.10 cm/s TAPSE (M-mode): 3.2 cm LEFT ATRIUM             Index        RIGHT ATRIUM           Index LA Vol (A2C):   77.6 ml 36.83 ml/m  RA Area:     22.90 cm LA Vol (A4C):   52.8 ml 25.06 ml/m  RA Volume:   70.50 ml  33.46 ml/m LA Biplane Vol: 69.3 ml  32.89 ml/m  AORTIC VALVE AV Area (Vmax):    1.61 cm AV Area (Vmean):   1.52 cm AV Area (VTI):     1.78 cm AV Vmax:           267.71 cm/s AV Vmean:          184.763 cm/s AV VTI:  0.483 m AV Peak Grad:      28.7 mmHg AV Mean Grad:      15.3 mmHg LVOT Vmax:         102.00 cm/s LVOT Vmean:        66.600 cm/s LVOT VTI:          0.203 m LVOT/AV VTI ratio: 0.42  AORTA Ao Sinus diam: 3.38 cm MV E velocity: 70.20 cm/s MV A velocity: 125.00 cm/s  SHUNTS MV E/A ratio:  0.56         Systemic VTI:  0.20 m                             Systemic Diam: 2.32 cm Jackquelyn Mass MD Electronically signed by Jackquelyn Mass MD Signature Date/Time: 07/12/2023/10:42:25 AM    Final     Administration History     None          Latest Ref Rng & Units 03/25/2023    9:39 AM 04/17/2022   10:46 AM 07/22/2021   11:47 AM 06/28/2020    8:56 AM 09/01/2019    2:42 PM  PFT Results  FVC-Pre L 3.50  3.72  3.52  3.70  3.76   FVC-Predicted Pre % 74  78  73  76  77   FVC-Post L 3.61  3.69  3.70  3.70  3.78   FVC-Predicted Post % 76  77  77  77  78   Pre FEV1/FVC % % 77  76  80  80  81   Post FEV1/FCV % % 80  78  81  82  83   FEV1-Pre L 2.70  2.82  2.82  2.95  3.03   FEV1-Predicted Pre % 78  81  80  83  84   FEV1-Post L 2.89  2.86  3.01  3.05  3.14   DLCO uncorrected ml/min/mmHg 15.22  14.25  15.89  19.32  22.76   DLCO UNC% % 55  52  57  69  81   DLCO corrected ml/min/mmHg 15.22  14.25  14.94  19.32  22.76   DLCO COR %Predicted % 55  52  54  69  81   DLVA Predicted % 78  69  67  88  97   TLC L 4.62  5.49  5.84  5.76  6.40   TLC % Predicted % 62  73  78  77  86   RV % Predicted % 47  69  69  79  93     No results found for: "NITRICOXIDE"      Assessment & Plan:  Assessment and Plan    Pulmonary fibrosis  -CT chest with probable UIP pattern. Continue on Ofev .  Is tolerating at a lower dose.  No significant weight changes.  Mild appetite suppression.  Aaron Aas He reports decreased appetite but no significant weight loss,  diarrhea, or vomiting.  CT chest in January 2025 showed mildly progressive pulmonary fibrosis.  PFT showed a decline in diffusing capacity.  Continue on Ofev  100 mg twice daily.  LFTs last month were stable.  Patient has underlying cirrhosis need to monitor closely.  Will check hepatic panel on return visit.  The last CT chest and pulmonary function test were in January 2025 .  Continue Ofev  at the current dose, monitor weight and appetite, and continue periodic liver function tests. Close follow-up in 3 to 4 months.  Chronic respiratory failure.  Encouraged patient on oxygen compliance.  Continue on oxygen to maintain O2 saturations greater than 88 to 90%.  Order for POC device.  Use oxygen 2 L continuous flow and 3 L pulsed oxygen on POC device.  Sleep apnea  -has not been using CPAP on a regular basis.  Patient education was given.  Patient says he is going to restart CPAP therapy.  Encouraged on CPAP compliance.  Aortic valve replacement   He recently underwent aortic valve replacement with no perceived complications.  He is beginning cardiac rehab per cardiology   Chronic cough-continue on current regimen.  Discontinued gabapentin -patient does not want to take. May use Delsym as needed  HIV -continue follow-up with infectious disease.     I spent 40   minutes dedicated to the care of this patient on the date of this encounter to include pre-visit review of records, face-to-face time with the patient discussing conditions above, post visit ordering of testing, clinical documentation with the electronic health record, making appropriate referrals as documented, and communicating necessary findings to members of the patients care team.    Roena Clark, NP 07/20/2023

## 2023-07-21 DIAGNOSIS — L570 Actinic keratosis: Secondary | ICD-10-CM | POA: Diagnosis not present

## 2023-07-21 DIAGNOSIS — L821 Other seborrheic keratosis: Secondary | ICD-10-CM | POA: Diagnosis not present

## 2023-07-22 ENCOUNTER — Encounter (HOSPITAL_COMMUNITY)
Admission: RE | Admit: 2023-07-22 | Discharge: 2023-07-22 | Disposition: A | Discharge status: Home / Self Care | Source: Ambulatory Visit | Attending: Pulmonary Disease | Admitting: Pulmonary Disease

## 2023-07-22 VITALS — BP 132/64 | HR 58 | Ht 73.0 in | Wt 196.4 lb

## 2023-07-22 DIAGNOSIS — J849 Interstitial pulmonary disease, unspecified: Secondary | ICD-10-CM | POA: Diagnosis not present

## 2023-07-22 DIAGNOSIS — J9611 Chronic respiratory failure with hypoxia: Secondary | ICD-10-CM | POA: Insufficient documentation

## 2023-07-22 DIAGNOSIS — Z952 Presence of prosthetic heart valve: Secondary | ICD-10-CM | POA: Insufficient documentation

## 2023-07-22 LAB — GLUCOSE, CAPILLARY: Glucose-Capillary: 152 mg/dL — ABNORMAL HIGH (ref 70–99)

## 2023-07-22 NOTE — Progress Notes (Signed)
 Cardiac Individual Treatment Plan  Patient Details  Name: Tyler Hendricks MRN: [id number] Date of Birth: 1950/11/20 Referring Provider:   Flowsheet Row INTENSIVE CARDIAC REHAB ORIENT from 07/22/2023 in [facility]  Referring Provider Fransico Ivy, MD       Initial Encounter Date:  Flowsheet Row INTENSIVE CARDIAC REHAB ORIENT from 07/22/2023 in [facility]  Date 07/22/23       Visit Diagnosis: 06/08/23 S/P TAVR (transcatheter aortic valve replacement)  Patient's Home Medications on Admission:  Current Outpatient Medications:    amLODipine  (NORVASC ) 10 MG tablet, Take 1 tablet (10 mg total) by mouth daily., Disp: 180 tablet, Rfl: 3   atenolol  (TENORMIN ) 50 MG tablet, TAKE 1 TABLET BY MOUTH EVERY DAY, Disp: 90 tablet, Rfl: 3   bictegravir-emtricitabine -tenofovir  AF (BIKTARVY ) 50-200-25 MG TABS tablet, Take 1 tablet by mouth daily., Disp: 30 tablet, Rfl: 11   cholecalciferol (VITAMIN D3) 25 MCG (1000 UNIT) tablet, Take 1,000 Units by mouth daily., Disp: , Rfl:    clopidogrel  (PLAVIX ) 75 MG tablet, TAKE 1 TABLET BY MOUTH EVERY DAY, Disp: 90 tablet, Rfl: 3   diazepam  (VALIUM ) 10 MG tablet, Take 1 tablet (10 mg total) by mouth as directed. Take 1/2 a tablet 1 hour prior to MRI. If needed take other half. Please have a driver drive you to and from MRI, Disp: 1 tablet, Rfl: 0   HYDROcodone  bit-homatropine (HYCODAN ) 5-1.5 MG/5ML syrup, Take 5 mLs by mouth every 6 (six) hours as needed (chronic cough - pulmonary fibrosis)., Disp: 473 mL, Rfl: 0   losartan -hydrochlorothiazide  (HYZAAR ) 100-25 MG tablet, Take 1 tablet by mouth daily., Disp: 90 tablet, Rfl: 3   Multiple Vitamin (MULTIVITAMIN) tablet, Take 1 tablet by mouth daily., Disp: , Rfl:    OFEV  100 MG CAPS, TAKE 1 CAPSULE BY MOUTH 2 TIMES A DAY WITH FOOD. TAKE 12 HOURS APART., Disp: 180 capsule, Rfl: 1   Oxycodone  HCl 10 MG  TABS, Take 10 mg by mouth 3 (three) times daily as needed (Pain)., Disp: , Rfl:    rosuvastatin  (CRESTOR ) 10 MG tablet, Take 1 tablet (10 mg total) by mouth daily. (Patient taking differently: Take 10 mg by mouth at bedtime.), Disp: 90 tablet, Rfl: 3   SYNJARDY  XR 12.07-998 MG TB24, Take 2 tablets by mouth daily. (Patient taking differently: Take 2 tablets by mouth daily. Pt now taking twice a day.), Disp: 180 tablet, Rfl: 3   VASCEPA  1 g capsule, Take 2 capsules (2 g total) by mouth 2 (two) times daily., Disp: 360 capsule, Rfl: 3  Past Medical History: Past Medical History:  Diagnosis Date   CAD (coronary artery disease)    Diabetes mellitus without complication (HCC)    Essential (primary) hypertension    Gallstones    Hepatic cirrhosis (HCC)    Hepatitis B core antibody positive 01/08/2019   Hepatitis C    HIV infection (HCC)    Hyperlipidemia    Hypertension    Internal hemorrhoids    Kidney stones    Pulmonary fibrosis (HCC)    S/P TAVR (transcatheter aortic valve replacement) 06/08/2023   s/p TAVR with a 26mm Edwards S3UR via the TF approach by Dr. Abel Hoe & Dr. Honey Lusty   Severe aortic stenosis    Sleep apnea    severe, on CPAP   Tubular adenoma of colon     Tobacco Use: Social History   Tobacco Use  Smoking Status  Former   Current packs/day: 0.00   Average packs/day: 2.0 packs/day for 20.0 years (40.0 ttl pk-yrs)   Types: Cigarettes   Start date: 12   Quit date: 50   Years since quitting: 31.3   Passive exposure: Past  Smokeless Tobacco Never    Labs: Review Flowsheet  More data exists      Latest Ref Rng & Units 05/06/2022 11/03/2022 12/10/2022 04/23/2023 06/08/2023  Labs for ITP Cardiac and Pulmonary Rehab  Cholestrol 0 - 200 mg/dL - 540  - - -  LDL (calc) 0 - 99 mg/dL - 58  - - -  HDL-C >98.11 mg/dL - 91.47  - - -  Trlycerides 0.0 - 149.0 mg/dL - 82.9  - - -  Hemoglobin A1c 4.6 - 6.5 % 6.5  7.0  6.8  - -  PH, Arterial 7.35 - 7.45 - - - 7.391  -  PCO2  arterial 32 - 48 mmHg - - - 36.4  -  Bicarbonate 20.0 - 28.0 mmol/L - - - 25.8  24.3  25.4  22.1  25.6  -  TCO2 22 - 32 mmol/L - - - 27  26  27  23  27  26  24    Acid-base deficit 0.0 - 2.0 mmol/L - - - 1.0  2.0  -  O2 Saturation % - - - 80  82  80  97  81  -    Details       Multiple values from one day are sorted in reverse-chronological order         Capillary Blood Glucose: Lab Results  Component Value Date   GLUCAP 152 (H) 07/22/2023   GLUCAP 118 (H) 06/09/2023   GLUCAP 268 (H) 06/08/2023   GLUCAP 129 (H) 06/08/2023   GLUCAP 136 (H) 06/08/2023    POCT Glucose     Row Name 04/14/23 1142 07/22/23 1310           POCT Blood Glucose   Pre-Exercise 207 mg/dL 562 mg/dL               Exercise Target Goals: Exercise Program Goal: Individual exercise prescription set using results from initial 6 min walk test and THRR while considering  patient's activity barriers and safety.   Exercise Prescription Goal: Initial exercise prescription builds to 30-45 minutes a day of aerobic activity, 2-3 days per week.  Home exercise guidelines will be given to patient during program as part of exercise prescription that the participant will acknowledge.  Activity Barriers & Risk Stratification:  Activity Barriers & Cardiac Risk Stratification - 07/22/23 1330       Activity Barriers & Cardiac Risk Stratification   Activity Barriers Deconditioning;Shortness of Breath;Other (comment)    Comments Supplemental O2 use when exercising    Cardiac Risk Stratification High   Less than 5 METs on            6 Minute Walk:  6 Minute Walk     Row Name 04/14/23 1152 06/03/23 1528 07/22/23 1314     6 Minute Walk   Phase Initial Discharge Initial   Distance 1200 feet 1645 feet 1113 feet   Distance % Change -- 37.08 % --   Distance Feet Change -- 445 ft --   Walk Time 6 minutes 6 minutes 6 minutes   # of Rest Breaks 1  3:03-4:00 0 0   MPH 2.27 3.12 2.1   METS 2.61 3.56 2.72    RPE 11 11 11  Perceived Dyspnea  1 2 1    VO2 Peak 9.14 12.48 9.51   Symptoms No No Yes (comment)   Comments -- -- SOB   Resting HR 58 bpm 64 bpm 58 bpm   Resting BP 136/70 130/70 132/64   Resting Oxygen Saturation  92 % 93 % 92 %  RA   Exercise Oxygen Saturation  during 6 min walk 85 % 86 % 90 %  Started on 2L then increased to 3L   Max Ex. HR 80 bpm 120 bpm 86 bpm   Max Ex. BP 142/68 150/80 160/68   2 Minute Post BP 134/68 142/72 154/60     Interval HR   1 Minute HR 71 120 --   2 Minute HR 77 96 --   3 Minute HR 77 82 --   4 Minute HR 75 97 --   5 Minute HR 79 105 --   6 Minute HR 80 104 --   2 Minute Post HR 59 67 --   Interval Heart Rate? Yes Yes --     Interval Oxygen   Interval Oxygen? Yes Yes --   Baseline Oxygen Saturation % 92 % 93 % --   1 Minute Oxygen Saturation % 93 % 92 % --   1 Minute Liters of Oxygen 0 L 3 L --   2 Minute Oxygen Saturation % 88 % 90 % --   2 Minute Liters of Oxygen 0 L 3 L --   3 Minute Oxygen Saturation % 85 % 90 %  2:10- 86% --   3 Minute Liters of Oxygen 0 L 3 L  increased to 6L --   4 Minute Oxygen Saturation % 92 % 89 % --   4 Minute Liters of Oxygen 2 L 6 L --   5 Minute Oxygen Saturation % 93 % 88 % --   5 Minute Liters of Oxygen 2 L 6 L --   6 Minute Oxygen Saturation % 89 % 88 % --   6 Minute Liters of Oxygen 2 L 6 L --   2 Minute Post Oxygen Saturation % 94 % 93 % --   2 Minute Post Liters of Oxygen 2 L 6 L --            Oxygen Initial Assessment:  Oxygen Initial Assessment - 07/22/23 1444       Home Oxygen   Home Oxygen Device Portable Concentrator    Sleep Oxygen Prescription CPAP   not working, is receiving a new CPAP in July   Home Exercise Oxygen Prescription Continuous    Liters per minute 2    Home Resting Oxygen Prescription None    Compliance with Home Oxygen Use No   Only uses when SOB, does not use when walking dog.     Initial 6 min Walk   Oxygen Used Continuous    Liters per minute 3      Program  Oxygen Prescription   Program Oxygen Prescription Continuous    Liters per minute 3    Comments 90% on 3L      Intervention   Short Term Goals To learn and understand importance of monitoring SPO2 with pulse oximeter and demonstrate accurate use of the pulse oximeter.;To learn and understand importance of maintaining oxygen saturations>88%;To learn and demonstrate proper pursed lip breathing techniques or other breathing techniques. ;To learn and demonstrate proper use of respiratory medications;To learn and exhibit compliance with exercise, home and travel O2 prescription  Long  Term Goals Maintenance of O2 saturations>88%;Compliance with respiratory medication;Verbalizes importance of monitoring SPO2 with pulse oximeter and return demonstration;Exhibits proper breathing techniques, such as pursed lip breathing or other method taught during program session;Demonstrates proper use of MDI's;Exhibits compliance with exercise, home  and travel O2 prescription             Oxygen Re-Evaluation:    Oxygen Discharge (Final Oxygen Re-Evaluation):    Initial Exercise Prescription:  Initial Exercise Prescription - 07/22/23 1400       Date of Initial Exercise RX and Referring Provider   Date 07/22/23    Referring Provider Fransico Ivy, MD    Expected Discharge Date 10/15/23      Oxygen   Oxygen Continuous    Liters 2-4    Maintain Oxygen Saturation 88% or higher      Bike   Level 2    Watts 26    Minutes 15    METs 2      NuStep   Level 2    SPM 65    Minutes 15    METs 2.3      Prescription Details   Frequency (times per week) 3 days/week    Duration Progress to 30 minutes of continuous aerobic without signs/symptoms of physical distress      Intensity   THRR 40-80% of Max Heartrate 59-118    Ratings of Perceived Exertion 11-13    Perceived Dyspnea 0-4      Progression   Progression Continue to progress workloads to maintain intensity without signs/symptoms of  physical distress.      Resistance Training   Training Prescription Yes    Weight 3lb wts    Reps 10-15             Perform Capillary Blood Glucose checks as needed.  Exercise Prescription Changes:     Exercise Comments:   Exercise Comments     Row Name 04/20/23 1509 07/22/23 1333         Exercise Comments Elvira completed his first day of exercise. He exercised for 15 min on the upright bike and treadmill. He averaged 2.6 METs at level 2 on the upright bike and 2.2 METs at 1.7 mph on the treadmill. Shandon performed the warmup and cooldown standing without limitations. Plan to discuss METs later on as Kairee felt overwhelmed today. Pt completed today. During walk test pt SpO2 dropped to 88% on 2LO2. SpO2 failed to rise after asking pt to slow down and focus on PLB. Spo2 increased to 90 after O2 increased to 3L               Exercise Goals and Review:   Exercise Goals     Row Name 04/14/23 1054 07/22/23 1332           Exercise Goals   Increase Physical Activity Yes Yes      Intervention Provide advice, education, support and counseling about physical activity/exercise needs.;Develop an individualized exercise prescription for aerobic and resistive training based on initial evaluation findings, risk stratification, comorbidities and participant's personal goals. Provide advice, education, support and counseling about physical activity/exercise needs.;Develop an individualized exercise prescription for aerobic and resistive training based on initial evaluation findings, risk stratification, comorbidities and participant's personal goals.      Expected Outcomes Short Term: Attend rehab on a regular basis to increase amount of physical activity.;Long Term: Add in home exercise to make exercise part of routine and to increase amount of physical  activity.;Long Term: Exercising regularly at least 3-5 days a week. Short Term: Attend rehab on a regular basis to increase  amount of physical activity.;Long Term: Add in home exercise to make exercise part of routine and to increase amount of physical activity.;Long Term: Exercising regularly at least 3-5 days a week.      Increase Strength and Stamina Yes Yes      Intervention Provide advice, education, support and counseling about physical activity/exercise needs.;Develop an individualized exercise prescription for aerobic and resistive training based on initial evaluation findings, risk stratification, comorbidities and participant's personal goals. Provide advice, education, support and counseling about physical activity/exercise needs.;Develop an individualized exercise prescription for aerobic and resistive training based on initial evaluation findings, risk stratification, comorbidities and participant's personal goals.      Expected Outcomes Short Term: Increase workloads from initial exercise prescription for resistance, speed, and METs.;Short Term: Perform resistance training exercises routinely during rehab and add in resistance training at home;Long Term: Improve cardiorespiratory fitness, muscular endurance and strength as measured by increased METs and functional capacity ( ) Short Term: Increase workloads from initial exercise prescription for resistance, speed, and METs.;Short Term: Perform resistance training exercises routinely during rehab and add in resistance training at home;Long Term: Improve cardiorespiratory fitness, muscular endurance and strength as measured by increased METs and functional capacity ( )      Able to understand and use rate of perceived exertion (RPE) scale Yes Yes      Intervention Provide education and explanation on how to use RPE scale Provide education and explanation on how to use RPE scale      Expected Outcomes Short Term: Able to use RPE daily in rehab to express subjective intensity level;Long Term:  Able to use RPE to guide intensity level when exercising independently  Short Term: Able to use RPE daily in rehab to express subjective intensity level;Long Term:  Able to use RPE to guide intensity level when exercising independently      Able to understand and use Dyspnea scale Yes Yes      Intervention Provide education and explanation on how to use Dyspnea scale Provide education and explanation on how to use Dyspnea scale      Expected Outcomes Short Term: Able to use Dyspnea scale daily in rehab to express subjective sense of shortness of breath during exertion;Long Term: Able to use Dyspnea scale to guide intensity level when exercising independently Short Term: Able to use Dyspnea scale daily in rehab to express subjective sense of shortness of breath during exertion;Long Term: Able to use Dyspnea scale to guide intensity level when exercising independently      Knowledge and understanding of Target Heart Rate Range (THRR) Yes Yes      Intervention Provide education and explanation of THRR including how the numbers were predicted and where they are located for reference Provide education and explanation of THRR including how the numbers were predicted and where they are located for reference      Expected Outcomes Short Term: Able to state/look up THRR;Long Term: Able to use THRR to govern intensity when exercising independently;Short Term: Able to use daily as guideline for intensity in rehab Short Term: Able to state/look up THRR;Long Term: Able to use THRR to govern intensity when exercising independently;Short Term: Able to use daily as guideline for intensity in rehab      Able to check pulse independently -- Yes      Intervention -- Provide education and demonstration on how to check pulse  in carotid and radial arteries.;Review the importance of being able to check your own pulse for safety during independent exercise      Expected Outcomes -- Short Term: Able to explain why pulse checking is important during independent exercise;Long Term: Able to check pulse  independently and accurately      Understanding of Exercise Prescription Yes Yes      Intervention Provide education, explanation, and written materials on patient's individual exercise prescription Provide education, explanation, and written materials on patient's individual exercise prescription      Expected Outcomes Short Term: Able to explain program exercise prescription;Long Term: Able to explain home exercise prescription to exercise independently Short Term: Able to explain program exercise prescription;Long Term: Able to explain home exercise prescription to exercise independently               Exercise Goals Re-Evaluation :    Discharge Exercise Prescription (Final Exercise Prescription Changes):    Nutrition:  Target Goals: Understanding of nutrition guidelines, daily intake of sodium 1500mg , cholesterol 200mg , calories 30% from fat and 7% or less from saturated fats, daily to have 5 or more servings of fruits and vegetables.  Biometrics:  Pre Biometrics - 07/22/23 1102       Pre Biometrics   Waist Circumference 41.5 inches    Hip Circumference 40.5 inches    Waist to Hip Ratio 1.02 %    Triceps Skinfold 7 mm    % Body Fat 24.3 %    Grip Strength 29 kg    Flexibility 0 in   could not reach   Single Leg Stand 13 seconds              Nutrition Therapy Plan and Nutrition Goals:   Nutrition Assessments:  MEDIFICTS Score Key: >=70 Need to make dietary changes  40-70 Heart Healthy Diet <= 40 Therapeutic Level Cholesterol Diet    Picture Your Plate Scores: <16 Unhealthy dietary pattern with much room for improvement. 41-50 Dietary pattern unlikely to meet recommendations for good health and room for improvement. 51-60 More healthful dietary pattern, with some room for improvement.  >60 Healthy dietary pattern, although there may be some specific behaviors that could be improved.    Nutrition Goals Re-Evaluation:   Nutrition Goals  Re-Evaluation:   Nutrition Goals Discharge (Final Nutrition Goals Re-Evaluation):   Psychosocial: Target Goals: Acknowledge presence or absence of significant depression and/or stress, maximize coping skills, provide positive support system. Participant is able to verbalize types and ability to use techniques and skills needed for reducing stress and depression.  Initial Review & Psychosocial Screening:  Initial Psych Review & Screening - 07/22/23 1441       Initial Review   Current issues with Current Sleep Concerns   pt cannot sleep well d/t broken CPAP.     Family Dynamics   Good Support System? Yes      Barriers   Psychosocial barriers to participate in program There are no identifiable barriers or psychosocial needs.      Screening Interventions   Interventions Encouraged to exercise;To provide support and resources with identified psychosocial needs    Expected Outcomes Long Term goal: The participant improves quality of Life and PHQ9 Scores as seen by post scores and/or verbalization of changes;Short Term goal: Identification and review with participant of any Quality of Life or Depression concerns found by scoring the questionnaire.;Long Term Goal: Stressors or current issues are controlled or eliminated.;Short Term goal: Utilizing psychosocial counselor, staff and physician to assist  with identification of specific Stressors or current issues interfering with healing process. Setting desired goal for each stressor or current issue identified.             Quality of Life Scores:  Quality of Life - 07/22/23 1343       Quality of Life   Select Quality of Life      Quality of Life Scores   Health/Function Pre 18.43 %    Socioeconomic Pre 17.4 %    Psych/Spiritual Pre 24.21 %    Family Pre 26.25 %    GLOBAL Pre 20.58 %            Scores of 19 and below usually indicate a poorer quality of life in these areas.  A difference of  2-3 points is a clinically  meaningful difference.  A difference of 2-3 points in the total score of the Quality of Life Index has been associated with significant improvement in overall quality of life, self-image, physical symptoms, and general health in studies assessing change in quality of life.  PHQ-9: Review Flowsheet  More data exists      07/22/2023 07/16/2023 04/14/2023 01/01/2023 07/10/2022  Depression screen PHQ 2/9  Decreased Interest 0 0 0 0 0  Down, Depressed, Hopeless 0 0 0 0 0  PHQ - 2 Score 0 0 0 0 0  Altered sleeping 1 0 0 - -  Tired, decreased energy 0 0 0 - -  Change in appetite 0 0 0 - -  Feeling bad or failure about yourself  0 0 0 - -  Trouble concentrating 0 0 0 - -  Moving slowly or fidgety/restless 0 0 0 - -  Suicidal thoughts 0 0 0 - -  PHQ-9 Score 1 0 0 - -  Difficult doing work/chores Not difficult at all Somewhat difficult Not difficult at all - -   Interpretation of Total Score  Total Score Depression Severity:  1-4 = Minimal depression, 5-9 = Mild depression, 10-14 = Moderate depression, 15-19 = Moderately severe depression, 20-27 = Severe depression   Psychosocial Evaluation and Intervention:    Psychosocial Re-Evaluation:  Psychosocial Re-Evaluation     Row Name 05/05/23 1131 06/07/23 0851           Psychosocial Re-Evaluation   Current issues with None Identified None Identified      Comments Khilyn has attended 4 sessions so far. He denies any new psychosocial barriers or concerns at this time. Pt was discharged from the program on 06/03/23 due to having surgery on 06/08/23. No needs at the time of discharge.      Expected Outcomes For Nasheed to participate in PR free of any psychosocial barriers or concnerns. --      Interventions Encouraged to attend Pulmonary Rehabilitation for the exercise --      Continue Psychosocial Services  No Follow up required --               Psychosocial Discharge (Final Psychosocial Re-Evaluation):    Vocational  Rehabilitation: Provide vocational rehab assistance to qualifying candidates.   Vocational Rehab Evaluation & Intervention:  Vocational Rehab - 07/22/23 1352       Initial Vocational Rehab Evaluation & Intervention   Assessment shows need for Vocational Rehabilitation No   Retired            Education: Education Goals: Education classes will be provided on a weekly basis, covering required topics. Participant will state understanding/return demonstration of topics presented.  Education     Row Name 04/22/23 1100     Education   Pulmonary Education Topics Stress and Energy Conservation     Stress and Energy Conservation   Date 04/22/23   Educator RN   Instruction Review Code 1- Verbalizes Understanding    Row Name 04/29/23 1000     Education   Pulmonary Education Topics Warning Signs and Symptoms     Warning Signs and Symptoms   Date 04/29/23   Educator RN   Instruction Review Code 1- Verbalizes Understanding    Row Name 05/06/23 1000     Education   Pulmonary Education Topics Risk Factor Reduction     Risk Factor Reduction   Date 05/06/23   Educator EP   Instruction Review Code 1- Verbalizes Understanding    Row Name 05/20/23 1000     Education   Pulmonary Education Topics Pulmonary Diseases     Pulmonary Diseases   Date 05/20/23   Educator RT   Instruction Review Code 1- Verbalizes Understanding    Row Name 06/03/23 1100     Education   Pulmonary Education Topics Other Education     Other Education   Date 06/03/23   Educator RT   Instruction Review Code 1- Verbalizes Understanding            Core Videos: Exercise    Move It!  Clinical staff conducted group or individual video education with verbal and written material and guidebook.  Patient learns the recommended Pritikin exercise program. Exercise with the goal of living a long, healthy life. Some of the health benefits of exercise include controlled diabetes, healthier blood  pressure levels, improved cholesterol levels, improved heart and lung capacity, improved sleep, and better body composition. Everyone should speak with their doctor before starting or changing an exercise routine.  Biomechanical Limitations Clinical staff conducted group or individual video education with verbal and written material and guidebook.  Patient learns how biomechanical limitations can impact exercise and how we can mitigate and possibly overcome limitations to have an impactful and balanced exercise routine.  Body Composition Clinical staff conducted group or individual video education with verbal and written material and guidebook.  Patient learns that body composition (ratio of muscle mass to fat mass) is a key component to assessing overall fitness, rather than body weight alone. Increased fat mass, especially visceral belly fat, can put us  at increased risk for metabolic syndrome, type 2 diabetes, heart disease, and even death. It is recommended to combine diet and exercise (cardiovascular and resistance training) to improve your body composition. Seek guidance from your physician and exercise physiologist before implementing an exercise routine.  Exercise Action Plan Clinical staff conducted group or individual video education with verbal and written material and guidebook.  Patient learns the recommended strategies to achieve and enjoy long-term exercise adherence, including variety, self-motivation, self-efficacy, and positive decision making. Benefits of exercise include fitness, good health, weight management, more energy, better sleep, less stress, and overall well-being.  Medical   Heart Disease Risk Reduction Clinical staff conducted group or individual video education with verbal and written material and guidebook.  Patient learns our heart is our most vital organ as it circulates oxygen, nutrients, white blood cells, and hormones throughout the entire body, and carries  waste away. Data supports a plant-based eating plan like the Pritikin Program for its effectiveness in slowing progression of and reversing heart disease. The video provides a number of recommendations to address heart disease.   Metabolic Syndrome  and Belly Fat  Clinical staff conducted group or individual video education with verbal and written material and guidebook.  Patient learns what metabolic syndrome is, how it leads to heart disease, and how one can reverse it and keep it from coming back. You have metabolic syndrome if you have 3 of the following 5 criteria: abdominal obesity, high blood pressure, high triglycerides, low HDL cholesterol, and high blood sugar.  Hypertension and Heart Disease Clinical staff conducted group or individual video education with verbal and written material and guidebook.  Patient learns that high blood pressure, or hypertension, is very common in the [place] . Hypertension is largely due to excessive salt intake, but other important risk factors include being overweight, physical inactivity, drinking too much alcohol, smoking, and not eating enough potassium from fruits and vegetables. High blood pressure is a leading risk factor for heart attack, stroke, congestive heart failure, dementia, kidney failure, and premature death. Long-term effects of excessive salt intake include stiffening of the arteries and thickening of heart muscle and organ damage. Recommendations include ways to reduce hypertension and the risk of heart disease.  Diseases of Our Time - Focusing on Diabetes Clinical staff conducted group or individual video education with verbal and written material and guidebook.  Patient learns why the best way to stop diseases of our time is prevention, through food and other lifestyle changes. Medicine (such as prescription pills and surgeries) is often only a Band-Aid on the problem, not a long-term solution. Most common diseases of our time include  obesity, type 2 diabetes, hypertension, heart disease, and cancer. The Pritikin Program is recommended and has been proven to help reduce, reverse, and/or prevent the damaging effects of metabolic syndrome.  Nutrition   Overview of the Pritikin Eating Plan  Clinical staff conducted group or individual video education with verbal and written material and guidebook.  Patient learns about the Pritikin Eating Plan for disease risk reduction. The Pritikin Eating Plan emphasizes a wide variety of unrefined, minimally-processed carbohydrates, like fruits, vegetables, whole grains, and legumes. Go, Caution, and Stop food choices are explained. Plant-based and lean animal proteins are emphasized. Rationale provided for low sodium intake for blood pressure control, low added sugars for blood sugar stabilization, and low added fats and oils for coronary artery disease risk reduction and weight management.  Calorie Density  Clinical staff conducted group or individual video education with verbal and written material and guidebook.  Patient learns about calorie density and how it impacts the Pritikin Eating Plan. Knowing the characteristics of the food you choose will help you decide whether those foods will lead to weight gain or weight loss, and whether you want to consume more or less of them. Weight loss is usually a side effect of the Pritikin Eating Plan because of its focus on low calorie-dense foods.  Label Reading  Clinical staff conducted group or individual video education with verbal and written material and guidebook.  Patient learns about the Pritikin recommended label reading guidelines and corresponding recommendations regarding calorie density, added sugars, sodium content, and whole grains.  Dining Out - Part 1  Clinical staff conducted group or individual video education with verbal and written material and guidebook.  Patient learns that restaurant meals can be sabotaging because they can be  so high in calories, fat, sodium, and/or sugar. Patient learns recommended strategies on how to positively address this and avoid unhealthy pitfalls.  Facts on Fats  Clinical staff conducted group or individual video education with verbal and  written material and guidebook.  Patient learns that lifestyle modifications can be just as effective, if not more so, as many medications for lowering your risk of heart disease. A Pritikin lifestyle can help to reduce your risk of inflammation and atherosclerosis (cholesterol build-up, or plaque, in the artery walls). Lifestyle interventions such as dietary choices and physical activity address the cause of atherosclerosis. A review of the types of fats and their impact on blood cholesterol levels, along with dietary recommendations to reduce fat intake is also included.  Nutrition Action Plan  Clinical staff conducted group or individual video education with verbal and written material and guidebook.  Patient learns how to incorporate Pritikin recommendations into their lifestyle. Recommendations include planning and keeping personal health goals in mind as an important part of their success.  Healthy Mind-Set    Healthy Minds, Bodies, Hearts  Clinical staff conducted group or individual video education with verbal and written material and guidebook.  Patient learns how to identify when they are stressed. Video will discuss the impact of that stress, as well as the many benefits of stress management. Patient will also be introduced to stress management techniques. The way we think, act, and feel has an impact on our hearts.  How Our Thoughts Can Heal Our Hearts  Clinical staff conducted group or individual video education with verbal and written material and guidebook.  Patient learns that negative thoughts can cause depression and anxiety. This can result in negative lifestyle behavior and serious health problems. Cognitive behavioral therapy is an  effective method to help control our thoughts in order to change and improve our emotional outlook.  Additional Videos:  Exercise    Improving Performance  Clinical staff conducted group or individual video education with verbal and written material and guidebook.  Patient learns to use a non-linear approach by alternating intensity levels and lengths of time spent exercising to help burn more calories and lose more body fat. Cardiovascular exercise helps improve heart health, metabolism, hormonal balance, blood sugar control, and recovery from fatigue. Resistance training improves strength, endurance, balance, coordination, reaction time, metabolism, and muscle mass. Flexibility exercise improves circulation, posture, and balance. Seek guidance from your physician and exercise physiologist before implementing an exercise routine and learn your capabilities and proper form for all exercise.  Introduction to Yoga  Clinical staff conducted group or individual video education with verbal and written material and guidebook.  Patient learns about yoga, a discipline of the coming together of mind, breath, and body. The benefits of yoga include improved flexibility, improved range of motion, better posture and core strength, increased lung function, weight loss, and positive self-image. Yoga's heart health benefits include lowered blood pressure, healthier heart rate, decreased cholesterol and triglyceride levels, improved immune function, and reduced stress. Seek guidance from your physician and exercise physiologist before implementing an exercise routine and learn your capabilities and proper form for all exercise.  Medical   Aging: Enhancing Your Quality of Life  Clinical staff conducted group or individual video education with verbal and written material and guidebook.  Patient learns key strategies and recommendations to stay in good physical health and enhance quality of life, such as prevention  strategies, having an advocate, securing a Health Care Proxy and Power of Attorney, and keeping a list of medications and system for tracking them. It also discusses how to avoid risk for bone loss.  Biology of Weight Control  Clinical staff conducted group or individual video education with verbal and written material and  guidebook.  Patient learns that weight gain occurs because we consume more calories than we burn (eating more, moving less). Even if your body weight is normal, you may have higher ratios of fat compared to muscle mass. Too much body fat puts you at increased risk for cardiovascular disease, heart attack, stroke, type 2 diabetes, and obesity-related cancers. In addition to exercise, following the Pritikin Eating Plan can help reduce your risk.  Decoding Lab Results  Clinical staff conducted group or individual video education with verbal and written material and guidebook.  Patient learns that lab test reflects one measurement whose values change over time and are influenced by many factors, including medication, stress, sleep, exercise, food, hydration, pre-existing medical conditions, and more. It is recommended to use the knowledge from this video to become more involved with your lab results and evaluate your numbers to speak with your doctor.   Diseases of Our Time - Overview  Clinical staff conducted group or individual video education with verbal and written material and guidebook.  Patient learns that according to the CDC, 50% to 70% of chronic diseases (such as obesity, type 2 diabetes, elevated lipids, hypertension, and heart disease) are avoidable through lifestyle improvements including healthier food choices, listening to satiety cues, and increased physical activity.  Sleep Disorders Clinical staff conducted group or individual video education with verbal and written material and guidebook.  Patient learns how good quality and duration of sleep are important to  overall health and well-being. Patient also learns about sleep disorders and how they impact health along with recommendations to address them, including discussing with a physician.  Nutrition  Dining Out - Part 2 Clinical staff conducted group or individual video education with verbal and written material and guidebook.  Patient learns how to plan ahead and communicate in order to maximize their dining experience in a healthy and nutritious manner. Included are recommended food choices based on the type of restaurant the patient is visiting.   Fueling a Banker conducted group or individual video education with verbal and written material and guidebook.  There is a strong connection between our food choices and our health. Diseases like obesity and type 2 diabetes are very prevalent and are in large-part due to lifestyle choices. The Pritikin Eating Plan provides plenty of food and hunger-curbing satisfaction. It is easy to follow, affordable, and helps reduce health risks.  Menu Workshop  Clinical staff conducted group or individual video education with verbal and written material and guidebook.  Patient learns that restaurant meals can sabotage health goals because they are often packed with calories, fat, sodium, and sugar. Recommendations include strategies to plan ahead and to communicate with the manager, chef, or server to help order a healthier meal.  Planning Your Eating Strategy  Clinical staff conducted group or individual video education with verbal and written material and guidebook.  Patient learns about the Pritikin Eating Plan and its benefit of reducing the risk of disease. The Pritikin Eating Plan does not focus on calories. Instead, it emphasizes high-quality, nutrient-rich foods. By knowing the characteristics of the foods, we choose, we can determine their calorie density and make informed decisions.  Targeting Your Nutrition Priorities  Clinical staff  conducted group or individual video education with verbal and written material and guidebook.  Patient learns that lifestyle habits have a tremendous impact on disease risk and progression. This video provides eating and physical activity recommendations based on your personal health goals, such as reducing LDL  cholesterol, losing weight, preventing or controlling type 2 diabetes, and reducing high blood pressure.  Vitamins and Minerals  Clinical staff conducted group or individual video education with verbal and written material and guidebook.  Patient learns different ways to obtain key vitamins and minerals, including through a recommended healthy diet. It is important to discuss all supplements you take with your doctor.   Healthy Mind-Set    Smoking Cessation  Clinical staff conducted group or individual video education with verbal and written material and guidebook.  Patient learns that cigarette smoking and tobacco addiction pose a serious health risk which affects millions of people. Stopping smoking will significantly reduce the risk of heart disease, lung disease, and many forms of cancer. Recommended strategies for quitting are covered, including working with your doctor to develop a successful plan.  Culinary   Becoming a Set designer conducted group or individual video education with verbal and written material and guidebook.  Patient learns that cooking at home can be healthy, cost-effective, quick, and puts them in control. Keys to cooking healthy recipes will include looking at your recipe, assessing your equipment needs, planning ahead, making it simple, choosing cost-effective seasonal ingredients, and limiting the use of added fats, salts, and sugars.  Cooking - Breakfast and Snacks  Clinical staff conducted group or individual video education with verbal and written material and guidebook.  Patient learns how important breakfast is to satiety and nutrition  through the entire day. Recommendations include key foods to eat during breakfast to help stabilize blood sugar levels and to prevent overeating at meals later in the day. Planning ahead is also a key component.  Cooking - Educational psychologist conducted group or individual video education with verbal and written material and guidebook.  Patient learns eating strategies to improve overall health, including an approach to cook more at home. Recommendations include thinking of animal protein as a side on your plate rather than center stage and focusing instead on lower calorie dense options like vegetables, fruits, whole grains, and plant-based proteins, such as beans. Making sauces in large quantities to freeze for later and leaving the skin on your vegetables are also recommended to maximize your experience.  Cooking - Healthy Salads and Dressing Clinical staff conducted group or individual video education with verbal and written material and guidebook.  Patient learns that vegetables, fruits, whole grains, and legumes are the foundations of the Pritikin Eating Plan. Recommendations include how to incorporate each of these in flavorful and healthy salads, and how to create homemade salad dressings. Proper handling of ingredients is also covered. Cooking - Soups and [employer] - Soups and Desserts Clinical staff conducted group or individual video education with verbal and written material and guidebook.  Patient learns that Pritikin soups and desserts make for easy, nutritious, and delicious snacks and meal components that are low in sodium, fat, sugar, and calorie density, while high in vitamins, minerals, and filling fiber. Recommendations include simple and healthy ideas for soups and desserts.   Overview     The Pritikin Solution Program Overview Clinical staff conducted group or individual video education with verbal and written material and guidebook.  Patient learns that the  results of the Pritikin Program have been documented in more than 100 articles published in peer-reviewed journals, and the benefits include reducing risk factors for (and, in some cases, even reversing) high cholesterol, high blood pressure, type 2 diabetes, obesity, and more! An overview of the three  key pillars of the Pritikin Program will be covered: eating well, doing regular exercise, and having a healthy mind-set.  WORKSHOPS  Exercise: Exercise Basics: Building Your Action Plan Clinical staff led group instruction and group discussion with PowerPoint presentation and patient guidebook. To enhance the learning environment the use of posters, models and videos may be added. At the conclusion of this workshop, patients will comprehend the difference between physical activity and exercise, as well as the benefits of incorporating both, into their routine. Patients will understand the FITT (Frequency, Intensity, Time, and Type) principle and how to use it to build an exercise action plan. In addition, safety concerns and other considerations for exercise and cardiac rehab will be addressed by the presenter. The purpose of this lesson is to promote a comprehensive and effective weekly exercise routine in order to improve patients' overall level of fitness.   Managing Heart Disease: Your Path to a Healthier Heart Clinical staff led group instruction and group discussion with PowerPoint presentation and patient guidebook. To enhance the learning environment the use of posters, models and videos may be added.At the conclusion of this workshop, patients will understand the anatomy and physiology of the heart. Additionally, they will understand how Pritikin's three pillars impact the risk factors, the progression, and the management of heart disease.  The purpose of this lesson is to provide a high-level overview of the heart, heart disease, and how the Pritikin lifestyle positively impacts risk  factors.  Exercise Biomechanics Clinical staff led group instruction and group discussion with PowerPoint presentation and patient guidebook. To enhance the learning environment the use of posters, models and videos may be added. Patients will learn how the structural parts of their bodies function and how these functions impact their daily activities, movement, and exercise. Patients will learn how to promote a neutral spine, learn how to manage pain, and identify ways to improve their physical movement in order to promote healthy living. The purpose of this lesson is to expose patients to common physical limitations that impact physical activity. Participants will learn practical ways to adapt and manage aches and pains, and to minimize their effect on regular exercise. Patients will learn how to maintain good posture while sitting, walking, and lifting.  Balance Training and Fall Prevention  Clinical staff led group instruction and group discussion with PowerPoint presentation and patient guidebook. To enhance the learning environment the use of posters, models and videos may be added. At the conclusion of this workshop, patients will understand the importance of their sensorimotor skills (vision, proprioception, and the vestibular system) in maintaining their ability to balance as they age. Patients will apply a variety of balancing exercises that are appropriate for their current level of function. Patients will understand the common causes for poor balance, possible solutions to these problems, and ways to modify their physical environment in order to minimize their fall risk. The purpose of this lesson is to teach patients about the importance of maintaining balance as they age and ways to minimize their risk of falling.  WORKSHOPS   Nutrition:  Fueling a Ship broker led group instruction and group discussion with PowerPoint presentation and patient guidebook. To enhance  the learning environment the use of posters, models and videos may be added. Patients will review the foundational principles of the Pritikin Eating Plan and understand what constitutes a serving size in each of the food groups. Patients will also learn Pritikin-friendly foods that are better choices when away from home and review  make-ahead meal and snack options. Calorie density will be reviewed and applied to three nutrition priorities: weight maintenance, weight loss, and weight gain. The purpose of this lesson is to reinforce (in a group setting) the key concepts around what patients are recommended to eat and how to apply these guidelines when away from home by planning and selecting Pritikin-friendly options. Patients will understand how calorie density may be adjusted for different weight management goals.  Mindful Eating  Clinical staff led group instruction and group discussion with PowerPoint presentation and patient guidebook. To enhance the learning environment the use of posters, models and videos may be added. Patients will briefly review the concepts of the Pritikin Eating Plan and the importance of low-calorie dense foods. The concept of mindful eating will be introduced as well as the importance of paying attention to internal hunger signals. Triggers for non-hunger eating and techniques for dealing with triggers will be explored. The purpose of this lesson is to provide patients with the opportunity to review the basic principles of the Pritikin Eating Plan, discuss the value of eating mindfully and how to measure internal cues of hunger and fullness using the Hunger Scale. Patients will also discuss reasons for non-hunger eating and learn strategies to use for controlling emotional eating.  Targeting Your Nutrition Priorities Clinical staff led group instruction and group discussion with PowerPoint presentation and patient guidebook. To enhance the learning environment the use of posters,  models and videos may be added. Patients will learn how to determine their genetic susceptibility to disease by reviewing their family history. Patients will gain insight into the importance of diet as part of an overall healthy lifestyle in mitigating the impact of genetics and other environmental insults. The purpose of this lesson is to provide patients with the opportunity to assess their personal nutrition priorities by looking at their family history, their own health history and current risk factors. Patients will also be able to discuss ways of prioritizing and modifying the Pritikin Eating Plan for their highest risk areas  Menu  Clinical staff led group instruction and group discussion with PowerPoint presentation and patient guidebook. To enhance the learning environment the use of posters, models and videos may be added. Using menus brought in from [employer], or printed from [employer], patients will apply the Pritikin dining out guidelines that were presented in the [employer] video. Patients will also be able to practice these guidelines in a variety of provided scenarios. The purpose of this lesson is to provide patients with the opportunity to practice hands-on learning of the Pritikin Dining Out guidelines with actual menus and practice scenarios.  Label Reading Clinical staff led group instruction and group discussion with PowerPoint presentation and patient guidebook. To enhance the learning environment the use of posters, models and videos may be added. Patients will review and discuss the Pritikin label reading guidelines presented in Pritikin's Label Reading Educational series video. Using fool labels brought in from local grocery stores and markets, patients will apply the label reading guidelines and determine if the packaged food meet the Pritikin guidelines. The purpose of this lesson is to provide patients with the opportunity to review, discuss, and  practice hands-on learning of the Pritikin Label Reading guidelines with actual packaged food labels. Cooking School  Pritikin's [employer] are designed to teach patients ways to prepare quick, simple, and affordable recipes at home. The importance of nutrition's role in chronic disease risk reduction is reflected in its emphasis in  the overall Pritikin program. By learning how to prepare essential core Pritikin Eating Plan recipes, patients will increase control over what they eat; be able to customize the flavor of foods without the use of added salt, sugar, or fat; and improve the quality of the food they consume. By learning a set of core recipes which are easily assembled, quickly prepared, and affordable, patients are more likely to prepare more healthy foods at home. These workshops focus on convenient breakfasts, simple entres, side dishes, and desserts which can be prepared with minimal effort and are consistent with nutrition recommendations for cardiovascular risk reduction. Cooking [employer] are taught by a Armed forces logistics/support/administrative officer (RD) who has been trained by the [employer]. The chef or RD has a clear understanding of the importance of minimizing - if not completely eliminating - added fat, sugar, and sodium in recipes. Throughout the series of Cooking School Workshop sessions, patients will learn about healthy ingredients and efficient methods of cooking to build confidence in their capability to prepare    Cooking School weekly topics:  Adding Flavor- Sodium-Free  Fast and Healthy Breakfasts  Powerhouse Plant-Based Proteins  Satisfying Salads and Dressings  Simple Sides and Sauces  International Cuisine-Spotlight on the [employer] Zones  Delicious Desserts  Savory Soups  [employer] - Meals in a Astronomer Appetizers and Snacks  Comforting Weekend Breakfasts  One-Pot Wonders   Fast Evening Meals  Landscape architect Your  Pritikin Plate  WORKSHOPS   Healthy Mindset (Psychosocial):  Focused Goals, Sustainable Changes Clinical staff led group instruction and group discussion with PowerPoint presentation and patient guidebook. To enhance the learning environment the use of posters, models and videos may be added. Patients will be able to apply effective goal setting strategies to establish at least one personal goal, and then take consistent, meaningful action toward that goal. They will learn to identify common barriers to achieving personal goals and develop strategies to overcome them. Patients will also gain an understanding of how our mind-set can impact our ability to achieve goals and the importance of cultivating a positive and growth-oriented mind-set. The purpose of this lesson is to provide patients with a deeper understanding of how to set and achieve personal goals, as well as the tools and strategies needed to overcome common obstacles which may arise along the way.  From Head to Heart: The Power of a Healthy Outlook  Clinical staff led group instruction and group discussion with PowerPoint presentation and patient guidebook. To enhance the learning environment the use of posters, models and videos may be added. Patients will be able to recognize and describe the impact of emotions and mood on physical health. They will discover the importance of self-care and explore self-care practices which may work for them. Patients will also learn how to utilize the 4 C's to cultivate a healthier outlook and better manage stress and challenges. The purpose of this lesson is to demonstrate to patients how a healthy outlook is an essential part of maintaining good health, especially as they continue their cardiac rehab journey.  Healthy Sleep for a Healthy Heart Clinical staff led group instruction and group discussion with PowerPoint presentation and patient guidebook. To enhance the learning environment the use of  posters, models and videos may be added. At the conclusion of this workshop, patients will be able to demonstrate knowledge of the importance of sleep to overall health, well-being, and quality of life. They will understand the symptoms of,  and treatments for, common sleep disorders. Patients will also be able to identify daytime and nighttime behaviors which impact sleep, and they will be able to apply these tools to help manage sleep-related challenges. The purpose of this lesson is to provide patients with a general overview of sleep and outline the importance of quality sleep. Patients will learn about a few of the most common sleep disorders. Patients will also be introduced to the concept of "sleep hygiene," and discover ways to self-manage certain sleeping problems through simple daily behavior changes. Finally, the workshop will motivate patients by clarifying the links between quality sleep and their goals of heart-healthy living.   Recognizing and Reducing Stress Clinical staff led group instruction and group discussion with PowerPoint presentation and patient guidebook. To enhance the learning environment the use of posters, models and videos may be added. At the conclusion of this workshop, patients will be able to understand the types of stress reactions, differentiate between acute and chronic stress, and recognize the impact that chronic stress has on their health. They will also be able to apply different coping mechanisms, such as reframing negative self-talk. Patients will have the opportunity to practice a variety of stress management techniques, such as deep abdominal breathing, progressive muscle relaxation, and/or guided imagery.  The purpose of this lesson is to educate patients on the role of stress in their lives and to provide healthy techniques for coping with it.  Learning Barriers/Preferences:  Learning Barriers/Preferences - 07/22/23 1345       Learning Barriers/Preferences    Learning Barriers Sight   wears glasses   Learning Preferences Written Material;Skilled Demonstration             Education Topics:  Knowledge Questionnaire Score:  Knowledge Questionnaire Score - 07/22/23 1351       Knowledge Questionnaire Score   Pre Score 15/24             Core Components/Risk Factors/Patient Goals at Admission:  Personal Goals and Risk Factors at Admission - 07/22/23 1352       Core Components/Risk Factors/Patient Goals on Admission    Weight Management Yes;Weight Maintenance    Intervention Weight Management: Develop a combined nutrition and exercise program designed to reach desired caloric intake, while maintaining appropriate intake of nutrient and fiber, sodium and fats, and appropriate energy expenditure required for the weight goal.;Weight Management: Provide education and appropriate resources to help participant work on and attain dietary goals.    Admit Weight 196 lb 6.9 oz (89.1 kg)    Improve shortness of breath with ADL's Yes    Intervention Provide education, individualized exercise plan and daily activity instruction to help decrease symptoms of SOB with activities of daily living.    Expected Outcomes Short Term: Improve cardiorespiratory fitness to achieve a reduction of symptoms when performing ADLs;Long Term: Be able to perform more ADLs without symptoms or delay the onset of symptoms    Increase knowledge of respiratory medications and ability to use respiratory devices properly  Yes    Intervention Provide education and demonstration as needed of appropriate use of medications, inhalers, and oxygen therapy.    Expected Outcomes Short Term: Achieves understanding of medications use. Understands that oxygen is a medication prescribed by physician. Demonstrates appropriate use of inhaler and oxygen therapy.;Long Term: Maintain appropriate use of medications, inhalers, and oxygen therapy.    Diabetes Yes    Intervention Provide education  about signs/symptoms and action to take for hypo/hyperglycemia.;Provide education about proper nutrition, including  hydration, and aerobic/resistive exercise prescription along with prescribed medications to achieve blood glucose in normal ranges: Fasting glucose 65-99 mg/dL    Expected Outcomes Short Term: Participant verbalizes understanding of the signs/symptoms and immediate care of hyper/hypoglycemia, proper foot care and importance of medication, aerobic/resistive exercise and nutrition plan for blood glucose control.;Long Term: Attainment of HbA1C < 7%.    Hypertension Yes    Intervention Provide education on lifestyle modifcations including regular physical activity/exercise, weight management, moderate sodium restriction and increased consumption of fresh fruit, vegetables, and low fat dairy, alcohol moderation, and smoking cessation.;Monitor prescription use compliance.    Expected Outcomes Long Term: Maintenance of blood pressure at goal levels.;Short Term: Continued assessment and intervention until BP is < 140/27mm HG in hypertensive participants. < 130/61mm HG in hypertensive participants with diabetes, heart failure or chronic kidney disease.    Lipids Yes    Intervention Provide education and support for participant on nutrition & aerobic/resistive exercise along with prescribed medications to achieve LDL 70mg , HDL >40mg .    Expected Outcomes Short Term: Participant states understanding of desired cholesterol values and is compliant with medications prescribed. Participant is following exercise prescription and nutrition guidelines.;Long Term: Cholesterol controlled with medications as prescribed, with individualized exercise RX and with personalized nutrition plan. Value goals: LDL < 70mg , HDL > 40 mg.    Stress Yes    Intervention Offer individual and/or small group education and counseling on adjustment to heart disease, stress management and health-related lifestyle change. Teach and  support self-help strategies.    Expected Outcomes Short Term: Participant demonstrates changes in health-related behavior, relaxation and other stress management skills, ability to obtain effective social support, and compliance with psychotropic medications if prescribed.;Long Term: Emotional wellbeing is indicated by absence of clinically significant psychosocial distress or social isolation.             Core Components/Risk Factors/Patient Goals Review:   Goals and Risk Factor Review     Row Name 05/05/23 1133 06/07/23 0853           Core Components/Risk Factors/Patient Goals Review   Personal Goals Review Improve shortness of breath with ADL's;Develop more efficient breathing techniques such as purse lipped breathing and diaphragmatic breathing and practicing self-pacing with activity. Improve shortness of breath with ADL's;Develop more efficient breathing techniques such as purse lipped breathing and diaphragmatic breathing and practicing self-pacing with activity.      Review Tilford has attended 4 sessions so far. Goal progressing for improving shortness of breath with ADL's. Goal progressing for developing more efficient breathing techniques such as purse lipped breathing and diaphragmatic breathing; and practicing self-pacing with activity.  Gregori is currently requiring 2-4 L while exercising to keep sats >88%. He is currently exercising on the Treadmill and the bike. We will continue to monitor her progress throughout the program. Cardae was discharged from the program on 06/03/23 due to an upcoming surgery on 06/08/23. He did meet his goal for developing more efficient breathing techniques such as purse lipped breathing and diaphragmatic breathing; and practicing self-pacing with activity. He was able to demonstrate purse lip breathing when he was SOB. He also knew how to self pace himself based on his rate of perceived exertion scale. Goal was not met for improving his shortness of  breath with ADL's.      Expected Outcomes To improve shortness of breath with ADL's and develop more efficient breathing techniques such as purse lipped breathing and diaphragmatic breathing; and practicing self-pacing with activity. Patient will continue  to benefit from ongoing nutrition, exercise, and lifestyle modification               Core Components/Risk Factors/Patient Goals at Discharge (Final Review):   Goals and Risk Factor Review - 06/07/23 0853       Core Components/Risk Factors/Patient Goals Review   Personal Goals Review Improve shortness of breath with ADL's;Develop more efficient breathing techniques such as purse lipped breathing and diaphragmatic breathing and practicing self-pacing with activity.    Review Omni was discharged from the program on 06/03/23 due to an upcoming surgery on 06/08/23. He did meet his goal for developing more efficient breathing techniques such as purse lipped breathing and diaphragmatic breathing; and practicing self-pacing with activity. He was able to demonstrate purse lip breathing when he was SOB. He also knew how to self pace himself based on his rate of perceived exertion scale. Goal was not met for improving his shortness of breath with ADL's.    Expected Outcomes Patient will continue to benefit from ongoing nutrition, exercise, and lifestyle modification             ITP Comments:  ITP Comments     Row Name 07/22/23 1104           ITP Comments Gaylyn Keas, MD: Medical Director. Introduction to the Pritikin Education Program/Intensive Cardiac Rehab. Initial orientation packet reviewed with the patient.                Comments: Darrow End completed orientation on 07/22/23. During interview pt reported that he has not been using CPAP d/t it not working properly and he is scheduled to have it fixed or receive a new one in July. Pt has also been noncompliant when using oxygen while walking his dog. Pt does use oxygen when he is very  SOB. Pt not very motivated to do the program but is hoping that it will improve his health and SOB. Pt required 3L to maintain normal SpO2 values during walk test. Rhythm maintained NSR with rare PVCs throughout test and visit. Pt educated on Pritikin folder and video prior to leaving.  [id number]

## 2023-07-22 NOTE — Progress Notes (Signed)
 Cardiac Rehab Medication Review   Does the patient  feel that his/her medications are working for him/her?   Yes, pt stated that "he's still alive"  Has the patient been experiencing any side effects to the medications prescribed?   Pt has felt dizzy after standing too fast.  Does the patient measure his/her own blood pressure or blood glucose at home?   Pt wife measures his blood pressure at home everyday.  Does the patient have any problems obtaining medications due to transportation or finances?  Pt denies.   Understanding of regimen: fair Understanding of indications: good Potential of compliance: good    Comments: Meds reviewed.     Barkley Li MS, ACSM-CEP  07/22/2023  10:57 AM

## 2023-07-26 ENCOUNTER — Encounter (HOSPITAL_COMMUNITY)
Admission: RE | Admit: 2023-07-26 | Discharge: 2023-07-26 | Disposition: A | Discharge status: Home / Self Care | Source: Ambulatory Visit | Attending: Cardiology

## 2023-07-26 ENCOUNTER — Encounter: Payer: Self-pay | Admitting: Internal Medicine

## 2023-07-26 DIAGNOSIS — J849 Interstitial pulmonary disease, unspecified: Secondary | ICD-10-CM | POA: Diagnosis not present

## 2023-07-26 DIAGNOSIS — Z952 Presence of prosthetic heart valve: Secondary | ICD-10-CM | POA: Diagnosis not present

## 2023-07-26 DIAGNOSIS — K769 Liver disease, unspecified: Secondary | ICD-10-CM

## 2023-07-26 DIAGNOSIS — J9611 Chronic respiratory failure with hypoxia: Secondary | ICD-10-CM | POA: Diagnosis not present

## 2023-07-26 LAB — GLUCOSE, CAPILLARY
Glucose-Capillary: 125 mg/dL — ABNORMAL HIGH (ref 70–99)
Glucose-Capillary: 127 mg/dL — ABNORMAL HIGH (ref 70–99)

## 2023-07-26 NOTE — Progress Notes (Signed)
 Daily Session Note  Patient Details  Name: Nester Wiencek MRN: [id number] Date of Birth: 1950/11/18 Referring Provider:   Flowsheet Row INTENSIVE CARDIAC REHAB ORIENT from 07/22/2023 in [facility]  Referring Provider Fransico Ivy, MD       Encounter Date: 07/26/2023  Check In:  Session Check In - 07/26/23 1431       Check-In   Supervising physician immediately available to respond to emergencies [facility] MD immediately available    Physician(s) Slater Duncan, NP    Location [facility]    Staff Present Joann Mu, RN, Ranee Button, RN, Lysbeth Sauger, MS, ACSM-CEP, Exercise Physiologist;Johnny Alexia Angelucci, MS, Exercise Physiologist;Casey Felipe Horton, RT    Virtual Visit No    Medication changes reported     No    Tobacco Cessation No Change    Warm-up and Cool-down Performed as group-led instruction    Resistance Training Performed Yes    VAD Patient? No    PAD/SET Patient? No      Pain Assessment   Currently in Pain? No/denies             Capillary Blood Glucose: Results for orders placed or performed during the hospital encounter of 07/26/23 (from the past 24 hours)  Glucose, capillary     Status: Abnormal   Collection Time: 07/26/23  1:43 PM  Result Value Ref Range   Glucose-Capillary 127 (H) 70 - 99 mg/dL     Exercise Prescription Changes - 07/26/23 1600       Response to Exercise   Blood Pressure (Admit) 124/62    Blood Pressure (Exercise) 130/64    Blood Pressure (Exit) 102/62    Heart Rate (Admit) 55 bpm    Heart Rate (Exercise) 109 bpm    Heart Rate (Exit) 57 bpm    Oxygen Saturation (Admit) 96 %    Oxygen Saturation (Exercise) 93 %    Oxygen Saturation (Exit) 97 %    Rating of Perceived Exertion (Exercise) 9    Perceived Dyspnea (Exercise) 1    Duration Continue with 30 min of aerobic exercise without signs/symptoms of physical distress.    Intensity THRR unchanged       Progression   Progression Continue to progress workloads to maintain intensity without signs/symptoms of physical distress.    Average METs 2.6      Resistance Training   Training Prescription Yes    Weight 3lb wts    Reps 10-15      Oxygen   Oxygen Continuous    Liters 2      Bike   Level 3    Watts 25    Minutes 15    METs 2.88      NuStep   Level 2    SPM 80    Minutes 15    METs 2.3      Oxygen   Maintain Oxygen Saturation 88% or higher             Social History   Tobacco Use  Smoking Status Former   Current packs/day: 0.00   Average packs/day: 2.0 packs/day for 20.0 years (40.0 ttl pk-yrs)   Types: Cigarettes   Start date: 1   Quit date: 55   Years since quitting: 31.3   Passive exposure: Past  Smokeless Tobacco Never    Goals Met:  Exercise tolerated well No report of concerns or symptoms today Strength training completed today  Goals Unmet:  Weight down today will notify pulmonogy  Comments: Pt started cardiac rehab today.  Pt tolerated light exercise without difficulty. VSS, telemetry-Sinus rhythm , asymptomatic. Oxygen saturation's on 2l/min of oxygen  and CBG's within normal limits. Medication list reconciled. Pt denies barriers to medicaiton compliance.  PSYCHOSOCIAL ASSESSMENT:  PHQ-1. Will review quality of life in the upcoming week. Bob's weight is down 3.3. kg from orientation.  Will notify patient's pulmonogist Tammy Parret NP. [place] says he does not have much of an appetite and is taking OFEV .   Pt enjoys reading .   Pt oriented to exercise equipment and routine.    Understanding verbalized.Monte Antonio RN BSN     Dr. Gaylyn Keas is Medical Director for Cardiac Rehab at [facility].

## 2023-07-27 ENCOUNTER — Ambulatory Visit: Payer: Self-pay

## 2023-07-27 NOTE — Telephone Encounter (Signed)
 [name], PA-C      07/27/23 10:57 AM Can someone send an order in for an open MRI to [facility] imaging?  Thank you, KT    Noted  New order placed  MyChart message sent to patient

## 2023-07-27 NOTE — Telephone Encounter (Signed)
  Chief Complaint: weight loss Symptoms: weight loss of 8 lb, decreased appetite Frequency: x couple Pertinent Negatives: Patient denies anxiety, depression Disposition: [] ED /[] Urgent Care (no appt availability in office) / [] Appointment(In office/virtual)/ []  [facility]/ [] Home Care/ [] Refused Recommended Disposition /[] [facility]/ [x]  Follow-up with PCP Additional Notes: pt stated medication he is on OFEV  takes his appetite therefore he does not eat as much - pt weight has decreased over couple of weeks not couple of days.  189 last weight 07/26/2023.  Eat only when feels hungry. Snack a lot .  Will route information to PCP   Reason for Disposition  MILD weight loss (e.g., less than 5% ; 0.5  to 2 kg) caused by recent illness (e.g., COVID-19, influenza, stomach flu)  Answer Assessment - Initial Assessment Questions 1. MAIN CONCERN: "What is your main concern today?"     Cardiac expressed concerned r/t t wt: pt has no concern 2. WEIGHT LOSS: "How much weight have you lost?"  (e.g., lbs., kgs.)  "Over what period of time have you lost this weight?"  (e.g., number of days, weeks, months, years)     Pt stated weight loss over couple of week 3. BASELINE WEIGHT: "What is your baseline or normal weight?" (e.g., "How much do you usually weigh?")     N/a 4. CAUSE: "What do you think is causing the weight loss?" (e.g., depression, anxiety, medicine side effect, pain, trouble swallowing, substance or alcohol use problem, eating disorder)     Pt states not appetite due to medication 5. PRIOR EVALUATION: "Have you been evaluated by a doctor for your weight loss?" If Yes, ask "When was your last visit?" "What did your doctor (or NP/PA) tell you about the possible cause?"     N/a 6. HEART FAILURE TREATMENT: "Do you have heart failure?" If Yes, ask: "Have you taken new or extra water pills (diuretics) recently?" (e.g., furosemide ; bumetanide). "What is your target weight?"      N/a 7. OTHER SYMPTOMS: "Do you have any other symptoms?" (e.g., anxiety or depression, blood in stool, breathing difficulty, diarrhea, fever, trouble swallowing)     N/a 8. PREGNANCY: "Is there any chance you are pregnant?" "When was your last menstrual period?"     N/a  Protocols used: Weight Loss - Unintended-A-AH

## 2023-07-27 NOTE — Telephone Encounter (Addendum)
 2nd attempt to reach patient, left message to return call for triage.   1st attempt to reach patient, left message to return call for triage.   Message from Hilton Lucky sent at 07/27/2023 11:20 AM EDT  Copied From CRM [id number]. Reason for Triage: Shelagh Derrick with Cardiac Rehab is calling concerned regarding patient, who has lost 7 pounds in less than 4 days. Has HIV and Hep C, states has little to no appetite and is wondering if it may be attributed to the OFEV .  05/05 - 85.8 kg Orientation 05/01 - 89.1kg Has lost nearly seven pounds in less than 4 days.  States she does not need a f/u but if needed, she can be reached at [phone] Shelagh Derrick. Requesting to f/u with patient.

## 2023-07-28 ENCOUNTER — Encounter (HOSPITAL_COMMUNITY)
Admission: RE | Admit: 2023-07-28 | Discharge: 2023-07-28 | Disposition: A | Discharge status: Home / Self Care | Source: Ambulatory Visit | Attending: Cardiology | Admitting: Cardiology

## 2023-07-28 DIAGNOSIS — J849 Interstitial pulmonary disease, unspecified: Secondary | ICD-10-CM | POA: Diagnosis not present

## 2023-07-28 DIAGNOSIS — Z952 Presence of prosthetic heart valve: Secondary | ICD-10-CM

## 2023-07-28 DIAGNOSIS — J9611 Chronic respiratory failure with hypoxia: Secondary | ICD-10-CM | POA: Diagnosis not present

## 2023-07-28 LAB — GLUCOSE, CAPILLARY
Glucose-Capillary: 178 mg/dL — ABNORMAL HIGH (ref 70–99)
Glucose-Capillary: 178 mg/dL — ABNORMAL HIGH (ref 70–99)

## 2023-07-28 NOTE — Telephone Encounter (Signed)
 Can offer visit to discuss or he can call provider prescribing ofev 

## 2023-07-29 NOTE — Telephone Encounter (Signed)
 Patient states that he has already spoken with the other provider and declines coming to see Dr Nicolette Barrio because the visit is not needed

## 2023-07-30 ENCOUNTER — Encounter (HOSPITAL_COMMUNITY)
Admission: RE | Admit: 2023-07-30 | Discharge: 2023-07-30 | Disposition: A | Discharge status: Home / Self Care | Source: Ambulatory Visit | Attending: Cardiology

## 2023-07-30 DIAGNOSIS — Z952 Presence of prosthetic heart valve: Secondary | ICD-10-CM

## 2023-07-30 DIAGNOSIS — J849 Interstitial pulmonary disease, unspecified: Secondary | ICD-10-CM | POA: Diagnosis not present

## 2023-07-30 DIAGNOSIS — J9611 Chronic respiratory failure with hypoxia: Secondary | ICD-10-CM | POA: Diagnosis not present

## 2023-07-30 LAB — GLUCOSE, CAPILLARY: Glucose-Capillary: 116 mg/dL — ABNORMAL HIGH (ref 70–99)

## 2023-08-02 ENCOUNTER — Encounter (HOSPITAL_COMMUNITY)

## 2023-08-02 ENCOUNTER — Telehealth (HOSPITAL_COMMUNITY): Payer: Self-pay | Admitting: *Deleted

## 2023-08-02 ENCOUNTER — Other Ambulatory Visit: Payer: Self-pay | Admitting: Pulmonary Disease

## 2023-08-02 ENCOUNTER — Ambulatory Visit: Payer: Self-pay | Admitting: Pulmonary Disease

## 2023-08-02 DIAGNOSIS — R053 Chronic cough: Secondary | ICD-10-CM

## 2023-08-02 NOTE — Telephone Encounter (Unsigned)
 Copied from CRM [id number]. Topic: Clinical - Medication Refill >> Aug 02, 2023 11:56 AM Isabell A wrote: Medication: HYDROcodone  bit-homatropine (HYCODAN ) 5-1.5 MG/5ML syrup  Has the patient contacted their pharmacy? Yes (Agent: If no, request that the patient contact the pharmacy for the refill. If patient does not wish to contact the pharmacy document the reason why and proceed with request.) (Agent: If yes, when and what did the pharmacy advise?)  This is the patient's preferred pharmacy:  [id number] [name], [place] - [address] AT [facility] [address] [facility] [address] [place] [address] Phone: [phone] Fax: [phone]  Is this the correct pharmacy for this prescription? Yes If no, delete pharmacy and type the correct one.   Has the prescription been filled recently? Yes  Is the patient out of the medication? Yes  Has the patient been seen for an appointment in the last year OR does the patient have an upcoming appointment? Yes  Can we respond through MyChart? No  Agent: Please be advised that Rx refills may take up to 3 business days. We ask that you follow-up with your pharmacy.

## 2023-08-02 NOTE — Telephone Encounter (Signed)
 Tyler Hendricks returned our call to inform us  he was not able to stay at cardiac rehab today to exercise due to his chronic pain. He usually takes tylenol  but did not take any today. He shared when the weather is like it is today his body hurts really badly. He plans to return on Wednesday.

## 2023-08-02 NOTE — Telephone Encounter (Signed)
 Pt last seen 07-20-23 and has an upcoming appointment on 10-18-23. Sending to Dr Waylan Haggard to advise on controlled substance refill request. This was filled on 07-13-2023

## 2023-08-02 NOTE — Telephone Encounter (Addendum)
 Second attempt made; no answer; left vm.   Third attempt; no answer; routed to office.

## 2023-08-02 NOTE — Telephone Encounter (Signed)
 Opened triage encounter for pt with request for refill of hydrocodone  cough syrup, LVM for pt to call back - see other nurse note 5/12.

## 2023-08-02 NOTE — Telephone Encounter (Signed)
 Copied from CRM [id number]. Topic: Clinical - Medication Refill >> Aug 02, 2023 11:56 AM Isabell A wrote: Medication: HYDROcodone  bit-homatropine (HYCODAN ) 5-1.5 MG/5ML syrup   Has the patient contacted their pharmacy? Yes (Agent: If no, request that the patient contact the pharmacy for the refill. If patient does not wish to contact the pharmacy document the reason why and proceed with request.) (Agent: If yes, when and what did the pharmacy advise?)   This is the patient's preferred pharmacy:  [id number] [name], [place] - [address] AT [facility] [address] [facility] [address] [place] [address] Phone: [phone] Fax: [phone]   Is this the correct pharmacy for this prescription? Yes If no, delete pharmacy and type the correct one.    Has the prescription been filled recently? Yes   Is the patient out of the medication? Yes   Has the patient been seen for an appointment in the last year OR does the patient have an upcoming appointment? Yes   Can we respond through MyChart? No   Agent: Please be advised that Rx refills may take up to 3 business days. We ask that you follow-up with your pharmacy.  Nurse attempted to call pt to triage if pt has acute symptoms with request for more hydrocodone  cough syrup. No ringing, no answer, LVM for call back. Placed in call back.

## 2023-08-03 ENCOUNTER — Telehealth: Payer: Self-pay | Admitting: Pulmonary Disease

## 2023-08-03 DIAGNOSIS — H353131 Nonexudative age-related macular degeneration, bilateral, early dry stage: Secondary | ICD-10-CM | POA: Diagnosis not present

## 2023-08-03 DIAGNOSIS — H35363 Drusen (degenerative) of macula, bilateral: Secondary | ICD-10-CM | POA: Diagnosis not present

## 2023-08-03 DIAGNOSIS — H35371 Puckering of macula, right eye: Secondary | ICD-10-CM | POA: Diagnosis not present

## 2023-08-03 MED ORDER — HYDROCODONE BIT-HOMATROP MBR 5-1.5 MG/5ML PO SOLN
5.0000 mL | Freq: Four times a day (QID) | ORAL | 0 refills | Status: DC | PRN
Start: 2023-08-03 — End: 2023-08-25

## 2023-08-03 NOTE — Telephone Encounter (Signed)
 Pt notified on Identifiable VM

## 2023-08-03 NOTE — Telephone Encounter (Signed)
 I have renewed his cough medication.

## 2023-08-03 NOTE — Telephone Encounter (Signed)
 Please see 08/02/2023 phone note.  Pt is aware.

## 2023-08-03 NOTE — Telephone Encounter (Signed)
 Lm x1 for patient.

## 2023-08-03 NOTE — Telephone Encounter (Signed)
 Patient is returning missed call.

## 2023-08-03 NOTE — Addendum Note (Signed)
 Addended byPhyllis Breeze on: 08/03/2023 08:36 AM   Modules accepted: Orders

## 2023-08-04 ENCOUNTER — Telehealth (HOSPITAL_COMMUNITY): Payer: Self-pay

## 2023-08-04 ENCOUNTER — Encounter (HOSPITAL_COMMUNITY): Admission: RE | Admit: 2023-08-04 | Source: Ambulatory Visit

## 2023-08-04 NOTE — Telephone Encounter (Signed)
 Patient c/o for 12:30pm class due to rain, will be back Friday.

## 2023-08-06 ENCOUNTER — Encounter (HOSPITAL_COMMUNITY)
Admission: RE | Admit: 2023-08-06 | Discharge: 2023-08-06 | Disposition: A | Discharge status: Home / Self Care | Source: Ambulatory Visit | Attending: Cardiology | Admitting: Cardiology

## 2023-08-06 DIAGNOSIS — J849 Interstitial pulmonary disease, unspecified: Secondary | ICD-10-CM | POA: Diagnosis not present

## 2023-08-06 DIAGNOSIS — J9611 Chronic respiratory failure with hypoxia: Secondary | ICD-10-CM | POA: Diagnosis not present

## 2023-08-06 DIAGNOSIS — Z952 Presence of prosthetic heart valve: Secondary | ICD-10-CM | POA: Diagnosis not present

## 2023-08-09 ENCOUNTER — Other Ambulatory Visit: Payer: Self-pay

## 2023-08-09 ENCOUNTER — Other Ambulatory Visit

## 2023-08-09 ENCOUNTER — Encounter (HOSPITAL_COMMUNITY): Admission: RE | Admit: 2023-08-09 | Source: Ambulatory Visit

## 2023-08-09 ENCOUNTER — Telehealth (HOSPITAL_COMMUNITY): Payer: Self-pay | Admitting: *Deleted

## 2023-08-10 ENCOUNTER — Other Ambulatory Visit: Payer: Self-pay

## 2023-08-10 ENCOUNTER — Other Ambulatory Visit: Payer: Self-pay | Admitting: Pharmacy Technician

## 2023-08-10 NOTE — Progress Notes (Signed)
 Cardiac Individual Treatment Plan  Patient Details  Name: Tyler Hendricks MRN: [id number] Date of Birth: 06-12-1950 Referring Provider:   Flowsheet Row INTENSIVE CARDIAC REHAB ORIENT from 07/22/2023 in [facility]  Referring Provider Fransico Ivy, MD       Initial Encounter Date:  Flowsheet Row INTENSIVE CARDIAC REHAB ORIENT from 07/22/2023 in [facility]  Date 07/22/23       Visit Diagnosis: 06/08/23 S/P TAVR (transcatheter aortic valve replacement)  ILD (interstitial lung disease) (HCC)  Chronic respiratory failure with hypoxia (HCC)  Patient's Home Medications on Admission:  Current Outpatient Medications:    amLODipine  (NORVASC ) 10 MG tablet, Take 1 tablet (10 mg total) by mouth daily., Disp: 180 tablet, Rfl: 3   atenolol  (TENORMIN ) 50 MG tablet, TAKE 1 TABLET BY MOUTH EVERY DAY, Disp: 90 tablet, Rfl: 3   bictegravir-emtricitabine -tenofovir  AF (BIKTARVY ) 50-200-25 MG TABS tablet, Take 1 tablet by mouth daily., Disp: 30 tablet, Rfl: 11   cholecalciferol (VITAMIN D3) 25 MCG (1000 UNIT) tablet, Take 1,000 Units by mouth daily., Disp: , Rfl:    clopidogrel  (PLAVIX ) 75 MG tablet, TAKE 1 TABLET BY MOUTH EVERY DAY, Disp: 90 tablet, Rfl: 3   diazepam  (VALIUM ) 10 MG tablet, Take 1 tablet (10 mg total) by mouth as directed. Take 1/2 a tablet 1 hour prior to MRI. If needed take other half. Please have a driver drive you to and from MRI, Disp: 1 tablet, Rfl: 0   HYDROcodone  bit-homatropine (HYCODAN ) 5-1.5 MG/5ML syrup, Take 5 mLs by mouth every 6 (six) hours as needed (chronic cough - pulmonary fibrosis)., Disp: 473 mL, Rfl: 0   losartan -hydrochlorothiazide  (HYZAAR ) 100-25 MG tablet, Take 1 tablet by mouth daily., Disp: 90 tablet, Rfl: 3   Multiple Vitamin (MULTIVITAMIN) tablet, Take 1 tablet by mouth daily., Disp: , Rfl:    OFEV  100 MG CAPS, TAKE 1 CAPSULE BY MOUTH 2 TIMES  A DAY WITH FOOD. TAKE 12 HOURS APART., Disp: 180 capsule, Rfl: 1   Oxycodone  HCl 10 MG TABS, Take 10 mg by mouth 3 (three) times daily as needed (Pain)., Disp: , Rfl:    rosuvastatin  (CRESTOR ) 10 MG tablet, Take 1 tablet (10 mg total) by mouth daily. (Patient taking differently: Take 10 mg by mouth at bedtime.), Disp: 90 tablet, Rfl: 3   SYNJARDY  XR 12.07-998 MG TB24, Take 2 tablets by mouth daily. (Patient taking differently: Take 2 tablets by mouth daily. Pt now taking twice a day.), Disp: 180 tablet, Rfl: 3   VASCEPA  1 g capsule, Take 2 capsules (2 g total) by mouth 2 (two) times daily., Disp: 360 capsule, Rfl: 3  Past Medical History: Past Medical History:  Diagnosis Date   CAD (coronary artery disease)    Diabetes mellitus without complication (HCC)    Essential (primary) hypertension    Gallstones    Hepatic cirrhosis (HCC)    Hepatitis B core antibody positive 01/08/2019   Hepatitis C    HIV infection (HCC)    Hyperlipidemia    Hypertension    Internal hemorrhoids    Kidney stones    Pulmonary fibrosis (HCC)    S/P TAVR (transcatheter aortic valve replacement) 06/08/2023   s/p TAVR with a 26mm Edwards S3UR via the TF approach by Dr. Abel Hoe & Dr. Honey Lusty   Severe aortic stenosis    Sleep apnea    severe, on CPAP   Tubular adenoma of colon  Tobacco Use: Social History   Tobacco Use  Smoking Status Former   Current packs/day: 0.00   Average packs/day: 2.0 packs/day for 20.0 years (40.0 ttl pk-yrs)   Types: Cigarettes   Start date: 94   Quit date: 49   Years since quitting: 31.4   Passive exposure: Past  Smokeless Tobacco Never    Labs: Review Flowsheet  More data exists      Latest Ref Rng & Units 05/06/2022 11/03/2022 12/10/2022 04/23/2023 06/08/2023  Labs for ITP Cardiac and Pulmonary Rehab  Cholestrol 0 - 200 mg/dL - 540  - - -  LDL (calc) 0 - 99 mg/dL - 58  - - -  HDL-C >98.11 mg/dL - 91.47  - - -  Trlycerides 0.0 - 149.0 mg/dL - 82.9  - - -   Hemoglobin A1c 4.6 - 6.5 % 6.5  7.0  6.8  - -  PH, Arterial 7.35 - 7.45 - - - 7.391  -  PCO2 arterial 32 - 48 mmHg - - - 36.4  -  Bicarbonate 20.0 - 28.0 mmol/L - - - 25.8  24.3  25.4  22.1  25.6  -  TCO2 22 - 32 mmol/L - - - 27  26  27  23  27  26  24    Acid-base deficit 0.0 - 2.0 mmol/L - - - 1.0  2.0  -  O2 Saturation % - - - 80  82  80  97  81  -    Details       Multiple values from one day are sorted in reverse-chronological order         Capillary Blood Glucose: Lab Results  Component Value Date   GLUCAP 116 (H) 07/30/2023   GLUCAP 178 (H) 07/28/2023   GLUCAP 178 (H) 07/28/2023   GLUCAP 127 (H) 07/26/2023   GLUCAP 125 (H) 07/26/2023    POCT Glucose     Row Name 04/14/23 1142 07/22/23 1310           POCT Blood Glucose   Pre-Exercise 207 mg/dL 562 mg/dL               Exercise Target Goals: Exercise Program Goal: Individual exercise prescription set using results from initial 6 min walk test and THRR while considering  patient's activity barriers and safety.   Exercise Prescription Goal: Initial exercise prescription builds to 30-45 minutes a day of aerobic activity, 2-3 days per week.  Home exercise guidelines will be given to patient during program as part of exercise prescription that the participant will acknowledge.  Activity Barriers & Risk Stratification:  Activity Barriers & Cardiac Risk Stratification - 07/22/23 1330       Activity Barriers & Cardiac Risk Stratification   Activity Barriers Deconditioning;Shortness of Breath;Other (comment)    Comments Supplemental O2 use when exercising    Cardiac Risk Stratification High   Less than 5 METs on            6 Minute Walk:  6 Minute Walk     Row Name 04/14/23 1152 06/03/23 1528 07/22/23 1314     6 Minute Walk   Phase Initial Discharge Initial   Distance 1200 feet 1645 feet 1113 feet   Distance % Change -- 37.08 % --   Distance Feet Change -- 445 ft --   Walk Time 6 minutes 6  minutes 6 minutes   # of Rest Breaks 1  3:03-4:00 0 0   MPH 2.27 3.12 2.1  METS 2.61 3.56 2.72   RPE 11 11 11    Perceived Dyspnea  1 2 1    VO2 Peak 9.14 12.48 9.51   Symptoms No No Yes (comment)   Comments -- -- SOB   Resting HR 58 bpm 64 bpm 58 bpm   Resting BP 136/70 130/70 132/64   Resting Oxygen Saturation  92 % 93 % 92 %  RA   Exercise Oxygen Saturation  during 6 min walk 85 % 86 % 90 %  Started on 2L then increased to 3L   Max Ex. HR 80 bpm 120 bpm 86 bpm   Max Ex. BP 142/68 150/80 160/68   2 Minute Post BP 134/68 142/72 154/60     Interval HR   1 Minute HR 71 120 --   2 Minute HR 77 96 --   3 Minute HR 77 82 --   4 Minute HR 75 97 --   5 Minute HR 79 105 --   6 Minute HR 80 104 --   2 Minute Post HR 59 67 --   Interval Heart Rate? Yes Yes --     Interval Oxygen   Interval Oxygen? Yes Yes --   Baseline Oxygen Saturation % 92 % 93 % --   1 Minute Oxygen Saturation % 93 % 92 % --   1 Minute Liters of Oxygen 0 L 3 L --   2 Minute Oxygen Saturation % 88 % 90 % --   2 Minute Liters of Oxygen 0 L 3 L --   3 Minute Oxygen Saturation % 85 % 90 %  2:10- 86% --   3 Minute Liters of Oxygen 0 L 3 L  increased to 6L --   4 Minute Oxygen Saturation % 92 % 89 % --   4 Minute Liters of Oxygen 2 L 6 L --   5 Minute Oxygen Saturation % 93 % 88 % --   5 Minute Liters of Oxygen 2 L 6 L --   6 Minute Oxygen Saturation % 89 % 88 % --   6 Minute Liters of Oxygen 2 L 6 L --   2 Minute Post Oxygen Saturation % 94 % 93 % --   2 Minute Post Liters of Oxygen 2 L 6 L --            Oxygen Initial Assessment:  Oxygen Initial Assessment - 07/22/23 1444       Home Oxygen   Home Oxygen Device Portable Concentrator    Sleep Oxygen Prescription CPAP   not working, is receiving a new CPAP in July   Home Exercise Oxygen Prescription Continuous    Liters per minute 2    Home Resting Oxygen Prescription None    Compliance with Home Oxygen Use No   Only uses when SOB, does not use when  walking dog.     Initial 6 min Walk   Oxygen Used Continuous    Liters per minute 3      Program Oxygen Prescription   Program Oxygen Prescription Continuous    Liters per minute 3    Comments 90% on 3L      Intervention   Short Term Goals To learn and understand importance of monitoring SPO2 with pulse oximeter and demonstrate accurate use of the pulse oximeter.;To learn and understand importance of maintaining oxygen saturations>88%;To learn and demonstrate proper pursed lip breathing techniques or other breathing techniques. ;To learn and demonstrate proper use of respiratory medications;To learn and  exhibit compliance with exercise, home and travel O2 prescription    Long  Term Goals Maintenance of O2 saturations>88%;Compliance with respiratory medication;Verbalizes importance of monitoring SPO2 with pulse oximeter and return demonstration;Exhibits proper breathing techniques, such as pursed lip breathing or other method taught during program session;Demonstrates proper use of MDI's;Exhibits compliance with exercise, home  and travel O2 prescription             Oxygen Re-Evaluation:  Oxygen Re-Evaluation     Row Name 05/07/23 1336             Goals/Expected Outcomes   Goals/Expected Outcomes Compliance and understanding of oxygen saturation and breathing techniques to decrease shortness of breath.                Oxygen Discharge (Final Oxygen Re-Evaluation):  Oxygen Re-Evaluation - 05/07/23 1336       Goals/Expected Outcomes   Goals/Expected Outcomes Compliance and understanding of oxygen saturation and breathing techniques to decrease shortness of breath.             Initial Exercise Prescription:  Initial Exercise Prescription - 07/22/23 1400       Date of Initial Exercise RX and Referring Provider   Date 07/22/23    Referring Provider Fransico Ivy, MD    Expected Discharge Date 10/15/23      Oxygen   Oxygen Continuous    Liters 2-4     Maintain Oxygen Saturation 88% or higher      Bike   Level 2    Watts 26    Minutes 15    METs 2      NuStep   Level 2    SPM 65    Minutes 15    METs 2.3      Prescription Details   Frequency (times per week) 3 days/week    Duration Progress to 30 minutes of continuous aerobic without signs/symptoms of physical distress      Intensity   THRR 40-80% of Max Heartrate 59-118    Ratings of Perceived Exertion 11-13    Perceived Dyspnea 0-4      Progression   Progression Continue to progress workloads to maintain intensity without signs/symptoms of physical distress.      Resistance Training   Training Prescription Yes    Weight 3lb wts    Reps 10-15             Perform Capillary Blood Glucose checks as needed.  Exercise Prescription Changes:   Exercise Prescription Changes     Row Name 04/22/23 1202 05/11/23 1100 05/25/23 1100 07/26/23 1600       Response to Exercise   Blood Pressure (Admit) 142/70 130/72 118/60 124/62    Blood Pressure (Exercise) 142/80 -- 120/70 130/64    Blood Pressure (Exit) 114/60 108/66 104/60 102/62    Heart Rate (Admit) 63 bpm 79 bpm 61 bpm 55 bpm    Heart Rate (Exercise) 82 bpm 109 bpm 78 bpm 109 bpm    Heart Rate (Exit) 64 bpm 79 bpm 69 bpm 57 bpm    Oxygen Saturation (Admit) 94 % 95 % 95 % 96 %    Oxygen Saturation (Exercise) 93 % 92 % 88 % 93 %    Oxygen Saturation (Exit) 96 % 93 % 93 % 97 %    Rating of Perceived Exertion (Exercise) 9 11 11 9     Perceived Dyspnea (Exercise) 1 1 1 1     Duration Continue with 30 min of aerobic exercise  without signs/symptoms of physical distress. Continue with 30 min of aerobic exercise without signs/symptoms of physical distress. Continue with 30 min of aerobic exercise without signs/symptoms of physical distress. Continue with 30 min of aerobic exercise without signs/symptoms of physical distress.    Intensity THRR unchanged THRR unchanged THRR unchanged THRR unchanged      Progression    Progression Continue to progress workloads to maintain intensity without signs/symptoms of physical distress. Continue to progress workloads to maintain intensity without signs/symptoms of physical distress. Continue to progress workloads to maintain intensity without signs/symptoms of physical distress. Continue to progress workloads to maintain intensity without signs/symptoms of physical distress.    Average METs -- -- -- 2.6      Resistance Training   Training Prescription Yes Yes Yes Yes    Weight blue bands blue bands blue bands 3lb wts    Reps 10-15 10-15 10-15 10-15    Time 10 Minutes 10 Minutes 10 Minutes --      Oxygen   Oxygen Continuous Continuous Continuous Continuous    Liters 2-4 2-4 2-4 2      Treadmill   MPH 2 2.1 2 --    Grade 0 0 1 --    Minutes 15 15 15  --    METs 2.4 2.5 2.81 --      Bike   Level 4 4 4 3     Watts -- -- 30 25    Minutes 15 15 15 15     METs 3.3 2.9 3.1 2.88      NuStep   Level -- -- -- 2    SPM -- -- -- 80    Minutes -- -- -- 15    METs -- -- -- 2.3      Oxygen   Maintain Oxygen Saturation 88% or higher -- 88% or higher 88% or higher             Exercise Comments:   Exercise Comments     Row Name 04/20/23 1509 07/22/23 1333 07/26/23 1652       Exercise Comments Tyler Hendricks completed his first day of exercise. He exercised for 15 min on the upright bike and treadmill. He averaged 2.6 METs at level 2 on the upright bike and 2.2 METs at 1.7 mph on the treadmill. Tyler Hendricks performed the warmup and cooldown standing without limitations. Plan to discuss METs later on as Tyler Hendricks felt overwhelmed today. Pt completed today. During walk test pt SpO2 dropped to 88% on 2LO2. SpO2 failed to rise after asking pt to slow down and focus on PLB. Spo2 increased to 90 after O2 increased to 3L Pt's first day in the CRP2 program. Pt exercised without complaints and is off to a good start.              Exercise Goals and Review:   Exercise Goals      Row Name 04/14/23 1054 07/22/23 1332           Exercise Goals   Increase Physical Activity Yes Yes      Intervention Provide advice, education, support and counseling about physical activity/exercise needs.;Develop an individualized exercise prescription for aerobic and resistive training based on initial evaluation findings, risk stratification, comorbidities and participant's personal goals. Provide advice, education, support and counseling about physical activity/exercise needs.;Develop an individualized exercise prescription for aerobic and resistive training based on initial evaluation findings, risk stratification, comorbidities and participant's personal goals.      Expected Outcomes Short Term: Attend rehab  on a regular basis to increase amount of physical activity.;Long Term: Add in home exercise to make exercise part of routine and to increase amount of physical activity.;Long Term: Exercising regularly at least 3-5 days a week. Short Term: Attend rehab on a regular basis to increase amount of physical activity.;Long Term: Add in home exercise to make exercise part of routine and to increase amount of physical activity.;Long Term: Exercising regularly at least 3-5 days a week.      Increase Strength and Stamina Yes Yes      Intervention Provide advice, education, support and counseling about physical activity/exercise needs.;Develop an individualized exercise prescription for aerobic and resistive training based on initial evaluation findings, risk stratification, comorbidities and participant's personal goals. Provide advice, education, support and counseling about physical activity/exercise needs.;Develop an individualized exercise prescription for aerobic and resistive training based on initial evaluation findings, risk stratification, comorbidities and participant's personal goals.      Expected Outcomes Short Term: Increase workloads from initial exercise prescription for resistance,  speed, and METs.;Short Term: Perform resistance training exercises routinely during rehab and add in resistance training at home;Long Term: Improve cardiorespiratory fitness, muscular endurance and strength as measured by increased METs and functional capacity ( ) Short Term: Increase workloads from initial exercise prescription for resistance, speed, and METs.;Short Term: Perform resistance training exercises routinely during rehab and add in resistance training at home;Long Term: Improve cardiorespiratory fitness, muscular endurance and strength as measured by increased METs and functional capacity ( )      Able to understand and use rate of perceived exertion (RPE) scale Yes Yes      Intervention Provide education and explanation on how to use RPE scale Provide education and explanation on how to use RPE scale      Expected Outcomes Short Term: Able to use RPE daily in rehab to express subjective intensity level;Long Term:  Able to use RPE to guide intensity level when exercising independently Short Term: Able to use RPE daily in rehab to express subjective intensity level;Long Term:  Able to use RPE to guide intensity level when exercising independently      Able to understand and use Dyspnea scale Yes Yes      Intervention Provide education and explanation on how to use Dyspnea scale Provide education and explanation on how to use Dyspnea scale      Expected Outcomes Short Term: Able to use Dyspnea scale daily in rehab to express subjective sense of shortness of breath during exertion;Long Term: Able to use Dyspnea scale to guide intensity level when exercising independently Short Term: Able to use Dyspnea scale daily in rehab to express subjective sense of shortness of breath during exertion;Long Term: Able to use Dyspnea scale to guide intensity level when exercising independently      Knowledge and understanding of Target Heart Rate Range (THRR) Yes Yes      Intervention Provide education and  explanation of THRR including how the numbers were predicted and where they are located for reference Provide education and explanation of THRR including how the numbers were predicted and where they are located for reference      Expected Outcomes Short Term: Able to state/look up THRR;Long Term: Able to use THRR to govern intensity when exercising independently;Short Term: Able to use daily as guideline for intensity in rehab Short Term: Able to state/look up THRR;Long Term: Able to use THRR to govern intensity when exercising independently;Short Term: Able to use daily as guideline for intensity in rehab  Able to check pulse independently -- Yes      Intervention -- Provide education and demonstration on how to check pulse in carotid and radial arteries.;Review the importance of being able to check your own pulse for safety during independent exercise      Expected Outcomes -- Short Term: Able to explain why pulse checking is important during independent exercise;Long Term: Able to check pulse independently and accurately      Understanding of Exercise Prescription Yes Yes      Intervention Provide education, explanation, and written materials on patient's individual exercise prescription Provide education, explanation, and written materials on patient's individual exercise prescription      Expected Outcomes Short Term: Able to explain program exercise prescription;Long Term: Able to explain home exercise prescription to exercise independently Short Term: Able to explain program exercise prescription;Long Term: Able to explain home exercise prescription to exercise independently               Exercise Goals Re-Evaluation :  Exercise Goals Re-Evaluation     Row Name 05/07/23 1328 06/03/23 1532 07/26/23 1651         Exercise Goal Re-Evaluation   Exercise Goals Review Increase Physical Activity;Able to understand and use Dyspnea scale;Understanding of Exercise Prescription;Increase  Strength and Stamina;Knowledge and understanding of Target Heart Rate Range (THRR);Able to understand and use rate of perceived exertion (RPE) scale Increase Physical Activity;Able to understand and use Dyspnea scale;Understanding of Exercise Prescription;Increase Strength and Stamina;Knowledge and understanding of Target Heart Rate Range (THRR);Able to understand and use rate of perceived exertion (RPE) scale Increase Physical Activity;Able to understand and use Dyspnea scale;Understanding of Exercise Prescription;Increase Strength and Stamina;Knowledge and understanding of Target Heart Rate Range (THRR);Able to understand and use rate of perceived exertion (RPE) scale     Comments Tyler Hendricks has completed 5 exercise sessions. He exercises for 15 min on the treadmill and upright bike. Tyler Hendricks averages 2.5 METs at 2.1 mph on the treadmill and 2.9 METs at level 4 on the upright bike. Tyler Hendricks performs the warmup and cooldown standing without limitations. Tyler Hendricks has increased his workload for both exercise modes as METs have increased. He tolerates progressions well and seems motivated to exercise. I discussed METs on his first day, although I am unsure if he understands METs yet. Will reiterate METs again. Will continue to monitor and progress as able. Tyler Hendricks has completed 11 exercise sessions. Peak METs were 3.1 on the upright bike and 2.8 on the treadmill. Pt is being discharged for cardiac procedure and will continue in Cardiac Rehab. Pt's first day in the CRP2 program. Pt understands the exercise Rx, RPE scale and THRR.     Expected Outcomes Through exercise at rehab and home, the patient will decrease shortness of breath and feel confident in carrying out an exercise regimen at home. Through exercise at rehab and home, the patient will decrease shortness of breath and feel confident in carrying out an exercise regimen at home. Will continue to monitor the patient and progress exercise workloads as tolerated.               Discharge Exercise Prescription (Final Exercise Prescription Changes):  Exercise Prescription Changes - 07/26/23 1600       Response to Exercise   Blood Pressure (Admit) 124/62    Blood Pressure (Exercise) 130/64    Blood Pressure (Exit) 102/62    Heart Rate (Admit) 55 bpm    Heart Rate (Exercise) 109 bpm    Heart Rate (Exit) 57 bpm  Oxygen Saturation (Admit) 96 %    Oxygen Saturation (Exercise) 93 %    Oxygen Saturation (Exit) 97 %    Rating of Perceived Exertion (Exercise) 9    Perceived Dyspnea (Exercise) 1    Duration Continue with 30 min of aerobic exercise without signs/symptoms of physical distress.    Intensity THRR unchanged      Progression   Progression Continue to progress workloads to maintain intensity without signs/symptoms of physical distress.    Average METs 2.6      Resistance Training   Training Prescription Yes    Weight 3lb wts    Reps 10-15      Oxygen   Oxygen Continuous    Liters 2      Bike   Level 3    Watts 25    Minutes 15    METs 2.88      NuStep   Level 2    SPM 80    Minutes 15    METs 2.3      Oxygen   Maintain Oxygen Saturation 88% or higher             Nutrition:  Target Goals: Understanding of nutrition guidelines, daily intake of sodium 1500mg , cholesterol 200mg , calories 30% from fat and 7% or less from saturated fats, daily to have 5 or more servings of fruits and vegetables.  Biometrics:  Pre Biometrics - 07/22/23 1102       Pre Biometrics   Waist Circumference 41.5 inches    Hip Circumference 40.5 inches    Waist to Hip Ratio 1.02 %    Triceps Skinfold 7 mm    % Body Fat 24.3 %    Grip Strength 29 kg    Flexibility 0 in   could not reach   Single Leg Stand 13 seconds              Nutrition Therapy Plan and Nutrition Goals:  Nutrition Therapy & Goals - 07/29/23 1033       Nutrition Therapy   Diet Heart Healthy Diet    Drug/Food Interactions Statins/Certain Fruits       Personal Nutrition Goals   Nutrition Goal Patient to identify strategies for reducing cardiovascular risk by attending the [employer] education and nutrition series weekly.    Personal Goal #2 Patient to improve diet quality by using the plate method as a guide for meal planning to include lean protein/plant protein, fruits, vegetables, whole grains, nonfat dairy as part of a well-balanced diet.    Personal Goal #3 Patient to maintain weight/ identify strategies for weight gain as needed of 0.5-2.0# per week.    Comments Tyler Hendricks has medical history of pulmonary fibrosis, s/p TAVR, sleep apnea, HIV, chronic respiratory failure, cirrhosis, DM2, hyperlipidemia, CAD, HTN. Patient reports concerns of weight loss since starting ofev ; he is down 12.3# over the last year (6/72024 office visit, 91.6kg). He continues Ofev  at this time. He is currently drinking 1 Boost per day (240kcals, 10g protein). A1c is at goal <7%. LDL/HDL are at goal. Per documentation, patient has been inconsistent with CPAP and O2 use.   Patient will benefit from participation in intensive cardiac rehab for nutrition, exercise, and lifestyle modification.      Intervention Plan   Intervention Prescribe, educate and counsel regarding individualized specific dietary modifications aiming towards targeted core components such as weight, hypertension, lipid management, diabetes, heart failure and other comorbidities.;Nutrition handout(s) given to patient.    Expected Outcomes Short Term Goal:  Understand basic principles of dietary content, such as calories, fat, sodium, cholesterol and nutrients.;Long Term Goal: Adherence to prescribed nutrition plan.             Nutrition Assessments:  Nutrition Assessments - 07/29/23 1515       Rate Your Plate Scores   Pre Score 47            MEDIFICTS Score Key: >=70 Need to make dietary changes  40-70 Heart Healthy Diet <= 40 Therapeutic Level Cholesterol Diet   Flowsheet Row INTENSIVE  CARDIAC REHAB from 07/26/2023 in [facility]  Picture Your Plate Total Score on Admission 47      Picture Your Plate Scores: <47 Unhealthy dietary pattern with much room for improvement. 41-50 Dietary pattern unlikely to meet recommendations for good health and room for improvement. 51-60 More healthful dietary pattern, with some room for improvement.  >60 Healthy dietary pattern, although there may be some specific behaviors that could be improved.    Nutrition Goals Re-Evaluation:  Nutrition Goals Re-Evaluation     Row Name 07/29/23 1033             Goals   Current Weight 189 lb 9.5 oz (86 kg)       Comment A1c 6.8, LDL 58, HDL 70       Expected Outcome Tyler Hendricks has medical history of pulmonary fibrosis, s/p TAVR, sleep apnea, HIV, chronic respiratory failure, cirrhosis, DM2, hyperlipidemia, CAD, HTN. Patient reports concerns of weight loss since starting ofev ; he is down 12.3# over the last year (6/72024 office visit, 91.6kg). He continues Ofev  at this time. He is currently drinking 1 Boost per day (240kcals, 10g protein). A1c is at goal <7%. LDL/HDL are at goal. Per documentation, patient has been inconsistent with CPAP and O2 use. Patient will benefit from participation in intensive cardiac rehab for nutrition, exercise, and lifestyle modification.                Nutrition Goals Re-Evaluation:  Nutrition Goals Re-Evaluation     Row Name 07/29/23 1033             Goals   Current Weight 189 lb 9.5 oz (86 kg)       Comment A1c 6.8, LDL 58, HDL 70       Expected Outcome Tyler Hendricks has medical history of pulmonary fibrosis, s/p TAVR, sleep apnea, HIV, chronic respiratory failure, cirrhosis, DM2, hyperlipidemia, CAD, HTN. Patient reports concerns of weight loss since starting ofev ; he is down 12.3# over the last year (6/72024 office visit, 91.6kg). He continues Ofev  at this time. He is currently drinking 1 Boost per day  (240kcals, 10g protein). A1c is at goal <7%. LDL/HDL are at goal. Per documentation, patient has been inconsistent with CPAP and O2 use. Patient will benefit from participation in intensive cardiac rehab for nutrition, exercise, and lifestyle modification.                Nutrition Goals Discharge (Final Nutrition Goals Re-Evaluation):  Nutrition Goals Re-Evaluation - 07/29/23 1033       Goals   Current Weight 189 lb 9.5 oz (86 kg)    Comment A1c 6.8, LDL 58, HDL 70    Expected Outcome Tyler Hendricks has medical history of pulmonary fibrosis, s/p TAVR, sleep apnea, HIV, chronic respiratory failure, cirrhosis, DM2, hyperlipidemia, CAD, HTN. Patient reports concerns of weight loss since starting ofev ; he is down 12.3# over the last year (6/72024 office visit,  91.6kg). He continues Ofev  at this time. He is currently drinking 1 Boost per day (240kcals, 10g protein). A1c is at goal <7%. LDL/HDL are at goal. Per documentation, patient has been inconsistent with CPAP and O2 use. Patient will benefit from participation in intensive cardiac rehab for nutrition, exercise, and lifestyle modification.             Psychosocial: Target Goals: Acknowledge presence or absence of significant depression and/or stress, maximize coping skills, provide positive support system. Participant is able to verbalize types and ability to use techniques and skills needed for reducing stress and depression.  Initial Review & Psychosocial Screening:  Initial Psych Review & Screening - 07/22/23 1441       Initial Review   Current issues with Current Sleep Concerns   pt cannot sleep well d/t broken CPAP.     Family Dynamics   Good Support System? Yes      Barriers   Psychosocial barriers to participate in program There are no identifiable barriers or psychosocial needs.      Screening Interventions   Interventions Encouraged to exercise;To provide support and resources with identified psychosocial needs    Expected  Outcomes Long Term goal: The participant improves quality of Life and PHQ9 Scores as seen by post scores and/or verbalization of changes;Short Term goal: Identification and review with participant of any Quality of Life or Depression concerns found by scoring the questionnaire.;Long Term Goal: Stressors or current issues are controlled or eliminated.;Short Term goal: Utilizing psychosocial counselor, staff and physician to assist with identification of specific Stressors or current issues interfering with healing process. Setting desired goal for each stressor or current issue identified.             Quality of Life Scores:  Quality of Life - 07/22/23 1343       Quality of Life   Select Quality of Life      Quality of Life Scores   Health/Function Pre 18.43 %    Socioeconomic Pre 17.4 %    Psych/Spiritual Pre 24.21 %    Family Pre 26.25 %    GLOBAL Pre 20.58 %            Scores of 19 and below usually indicate a poorer quality of life in these areas.  A difference of  2-3 points is a clinically meaningful difference.  A difference of 2-3 points in the total score of the Quality of Life Index has been associated with significant improvement in overall quality of life, self-image, physical symptoms, and general health in studies assessing change in quality of life.  PHQ-9: Review Flowsheet  More data exists      07/22/2023 07/16/2023 04/14/2023 01/01/2023 07/10/2022  Depression screen PHQ 2/9  Decreased Interest 0 0 0 0 0  Down, Depressed, Hopeless 0 0 0 0 0  PHQ - 2 Score 0 0 0 0 0  Altered sleeping 1 0 0 - -  Tired, decreased energy 0 0 0 - -  Change in appetite 0 0 0 - -  Feeling bad or failure about yourself  0 0 0 - -  Trouble concentrating 0 0 0 - -  Moving slowly or fidgety/restless 0 0 0 - -  Suicidal thoughts 0 0 0 - -  PHQ-9 Score 1 0 0 - -  Difficult doing work/chores Not difficult at all Somewhat difficult Not difficult at all - -   Interpretation of Total Score   Total Score Depression Severity:  1-4 =  Minimal depression, 5-9 = Mild depression, 10-14 = Moderate depression, 15-19 = Moderately severe depression, 20-27 = Severe depression   Psychosocial Evaluation and Intervention:  Psychosocial Evaluation - 04/14/23 1143       Psychosocial Evaluation & Interventions   Interventions Encouraged to exercise with the program and follow exercise prescription    Comments Tyler Hendricks denies any psychosocial barriers or concerns at this time.    Expected Outcomes For Tyler Hendricks to participate in PR free of any psychosocial barriers or concerns    Continue Psychosocial Services  No Follow up required             Psychosocial Re-Evaluation:  Psychosocial Re-Evaluation     Row Name 05/05/23 1131 06/07/23 0851 07/27/23 1111 08/10/23 1754       Psychosocial Re-Evaluation   Current issues with None Identified None Identified Current Sleep Concerns Current Sleep Concerns    Comments Tyler Hendricks has attended 4 sessions so far. He denies any new psychosocial barriers or concerns at this time. Pt was discharged from the program on 06/03/23 due to having surgery on 06/08/23. No needs at the time of discharge. Tyler Hendricks did not voice any increased concerns or stressors on his fiest day of exercise. Will discuss PHQ9 and quality of life in the upcoming week Tyler Hendricks has not voiced any increased concerns or stressors during exercise at cardiac rehab. Will discuss PHQ9 and quality of life in the upcoming week    Expected Outcomes For Tyler Hendricks to participate in PR free of any psychosocial barriers or concnerns. -- -- --    Interventions Encouraged to attend Pulmonary Rehabilitation for the exercise -- Stress management education;Relaxation education;Encouraged to attend Cardiac Rehabilitation for the exercise Stress management education;Relaxation education;Encouraged to attend Cardiac Rehabilitation for the exercise    Continue Psychosocial Services  No Follow up required -- Follow up  required by staff Follow up required by staff             Psychosocial Discharge (Final Psychosocial Re-Evaluation):  Psychosocial Re-Evaluation - 08/10/23 1754       Psychosocial Re-Evaluation   Current issues with Current Sleep Concerns    Comments Tyler Hendricks has not voiced any increased concerns or stressors during exercise at cardiac rehab. Will discuss PHQ9 and quality of life in the upcoming week    Interventions Stress management education;Relaxation education;Encouraged to attend Cardiac Rehabilitation for the exercise    Continue Psychosocial Services  Follow up required by staff             Vocational Rehabilitation: Provide vocational rehab assistance to qualifying candidates.   Vocational Rehab Evaluation & Intervention:  Vocational Rehab - 07/22/23 1352       Initial Vocational Rehab Evaluation & Intervention   Assessment shows need for Vocational Rehabilitation No   Retired            Education: Education Goals: Education classes will be provided on a weekly basis, covering required topics. Participant will state understanding/return demonstration of topics presented.    Education     Row Name 04/22/23 1100     Education   Pulmonary Education Topics Stress and Energy Conservation     Stress and Energy Conservation   Date 04/22/23   Educator RN   Instruction Review Code 1- Verbalizes Understanding    Row Name 04/29/23 1000     Education   Pulmonary Education Topics Warning Signs and Symptoms     Warning Signs and Symptoms   Date 04/29/23   Educator RN  Instruction Review Code 1- Verbalizes Understanding    Row Name 05/06/23 1000     Education   Pulmonary Education Topics Risk Factor Reduction     Risk Factor Reduction   Date 05/06/23   Educator EP   Instruction Review Code 1- Verbalizes Understanding    Row Name 05/20/23 1000     Education   Pulmonary Education Topics Pulmonary Diseases     Pulmonary Diseases   Date 05/20/23    Educator RT   Instruction Review Code 1- Verbalizes Understanding    Row Name 06/03/23 1100     Education   Pulmonary Education Topics Other Education     Other Education   Date 06/03/23   Educator RT   Instruction Review Code 1- Verbalizes Understanding            Core Videos: Exercise    Move It!  Clinical staff conducted group or individual video education with verbal and written material and guidebook.  Patient learns the recommended Pritikin exercise program. Exercise with the goal of living a long, healthy life. Some of the health benefits of exercise include controlled diabetes, healthier blood pressure levels, improved cholesterol levels, improved heart and lung capacity, improved sleep, and better body composition. Everyone should speak with their doctor before starting or changing an exercise routine.  Biomechanical Limitations Clinical staff conducted group or individual video education with verbal and written material and guidebook.  Patient learns how biomechanical limitations can impact exercise and how we can mitigate and possibly overcome limitations to have an impactful and balanced exercise routine.  Body Composition Clinical staff conducted group or individual video education with verbal and written material and guidebook.  Patient learns that body composition (ratio of muscle mass to fat mass) is a key component to assessing overall fitness, rather than body weight alone. Increased fat mass, especially visceral belly fat, can put us  at increased risk for metabolic syndrome, type 2 diabetes, heart disease, and even death. It is recommended to combine diet and exercise (cardiovascular and resistance training) to improve your body composition. Seek guidance from your physician and exercise physiologist before implementing an exercise routine.  Exercise Action Plan Clinical staff conducted group or individual video education with verbal and written material and  guidebook.  Patient learns the recommended strategies to achieve and enjoy long-term exercise adherence, including variety, self-motivation, self-efficacy, and positive decision making. Benefits of exercise include fitness, good health, weight management, more energy, better sleep, less stress, and overall well-being.  Medical   Heart Disease Risk Reduction Clinical staff conducted group or individual video education with verbal and written material and guidebook.  Patient learns our heart is our most vital organ as it circulates oxygen, nutrients, white blood cells, and hormones throughout the entire body, and carries waste away. Data supports a plant-based eating plan like the Pritikin Program for its effectiveness in slowing progression of and reversing heart disease. The video provides a number of recommendations to address heart disease.   Metabolic Syndrome and Belly Fat  Clinical staff conducted group or individual video education with verbal and written material and guidebook.  Patient learns what metabolic syndrome is, how it leads to heart disease, and how one can reverse it and keep it from coming back. You have metabolic syndrome if you have 3 of the following 5 criteria: abdominal obesity, high blood pressure, high triglycerides, low HDL cholesterol, and high blood sugar.  Hypertension and Heart Disease Clinical staff conducted group or individual video education with verbal  and written material and guidebook.  Patient learns that high blood pressure, or hypertension, is very common in the [place] . Hypertension is largely due to excessive salt intake, but other important risk factors include being overweight, physical inactivity, drinking too much alcohol, smoking, and not eating enough potassium from fruits and vegetables. High blood pressure is a leading risk factor for heart attack, stroke, congestive heart failure, dementia, kidney failure, and premature death. Long-term effects  of excessive salt intake include stiffening of the arteries and thickening of heart muscle and organ damage. Recommendations include ways to reduce hypertension and the risk of heart disease.  Diseases of Our Time - Focusing on Diabetes Clinical staff conducted group or individual video education with verbal and written material and guidebook.  Patient learns why the best way to stop diseases of our time is prevention, through food and other lifestyle changes. Medicine (such as prescription pills and surgeries) is often only a Band-Aid on the problem, not a long-term solution. Most common diseases of our time include obesity, type 2 diabetes, hypertension, heart disease, and cancer. The Pritikin Program is recommended and has been proven to help reduce, reverse, and/or prevent the damaging effects of metabolic syndrome.  Nutrition   Overview of the Pritikin Eating Plan  Clinical staff conducted group or individual video education with verbal and written material and guidebook.  Patient learns about the Pritikin Eating Plan for disease risk reduction. The Pritikin Eating Plan emphasizes a wide variety of unrefined, minimally-processed carbohydrates, like fruits, vegetables, whole grains, and legumes. Go, Caution, and Stop food choices are explained. Plant-based and lean animal proteins are emphasized. Rationale provided for low sodium intake for blood pressure control, low added sugars for blood sugar stabilization, and low added fats and oils for coronary artery disease risk reduction and weight management.  Calorie Density  Clinical staff conducted group or individual video education with verbal and written material and guidebook.  Patient learns about calorie density and how it impacts the Pritikin Eating Plan. Knowing the characteristics of the food you choose will help you decide whether those foods will lead to weight gain or weight loss, and whether you want to consume more or less of them. Weight  loss is usually a side effect of the Pritikin Eating Plan because of its focus on low calorie-dense foods.  Label Reading  Clinical staff conducted group or individual video education with verbal and written material and guidebook.  Patient learns about the Pritikin recommended label reading guidelines and corresponding recommendations regarding calorie density, added sugars, sodium content, and whole grains.  Dining Out - Part 1  Clinical staff conducted group or individual video education with verbal and written material and guidebook.  Patient learns that restaurant meals can be sabotaging because they can be so high in calories, fat, sodium, and/or sugar. Patient learns recommended strategies on how to positively address this and avoid unhealthy pitfalls.  Facts on Fats  Clinical staff conducted group or individual video education with verbal and written material and guidebook.  Patient learns that lifestyle modifications can be just as effective, if not more so, as many medications for lowering your risk of heart disease. A Pritikin lifestyle can help to reduce your risk of inflammation and atherosclerosis (cholesterol build-up, or plaque, in the artery walls). Lifestyle interventions such as dietary choices and physical activity address the cause of atherosclerosis. A review of the types of fats and their impact on blood cholesterol levels, along with dietary recommendations to reduce fat intake  is also included.  Nutrition Action Plan  Clinical staff conducted group or individual video education with verbal and written material and guidebook.  Patient learns how to incorporate Pritikin recommendations into their lifestyle. Recommendations include planning and keeping personal health goals in mind as an important part of their success.  Healthy Mind-Set    Healthy Minds, Bodies, Hearts  Clinical staff conducted group or individual video education with verbal and written material and  guidebook.  Patient learns how to identify when they are stressed. Video will discuss the impact of that stress, as well as the many benefits of stress management. Patient will also be introduced to stress management techniques. The way we think, act, and feel has an impact on our hearts.  How Our Thoughts Can Heal Our Hearts  Clinical staff conducted group or individual video education with verbal and written material and guidebook.  Patient learns that negative thoughts can cause depression and anxiety. This can result in negative lifestyle behavior and serious health problems. Cognitive behavioral therapy is an effective method to help control our thoughts in order to change and improve our emotional outlook.  Additional Videos:  Exercise    Improving Performance  Clinical staff conducted group or individual video education with verbal and written material and guidebook.  Patient learns to use a non-linear approach by alternating intensity levels and lengths of time spent exercising to help burn more calories and lose more body fat. Cardiovascular exercise helps improve heart health, metabolism, hormonal balance, blood sugar control, and recovery from fatigue. Resistance training improves strength, endurance, balance, coordination, reaction time, metabolism, and muscle mass. Flexibility exercise improves circulation, posture, and balance. Seek guidance from your physician and exercise physiologist before implementing an exercise routine and learn your capabilities and proper form for all exercise.  Introduction to Yoga  Clinical staff conducted group or individual video education with verbal and written material and guidebook.  Patient learns about yoga, a discipline of the coming together of mind, breath, and body. The benefits of yoga include improved flexibility, improved range of motion, better posture and core strength, increased lung function, weight loss, and positive self-image. Yoga's  heart health benefits include lowered blood pressure, healthier heart rate, decreased cholesterol and triglyceride levels, improved immune function, and reduced stress. Seek guidance from your physician and exercise physiologist before implementing an exercise routine and learn your capabilities and proper form for all exercise.  Medical   Aging: Enhancing Your Quality of Life  Clinical staff conducted group or individual video education with verbal and written material and guidebook.  Patient learns key strategies and recommendations to stay in good physical health and enhance quality of life, such as prevention strategies, having an advocate, securing a Health Care Proxy and Power of Attorney, and keeping a list of medications and system for tracking them. It also discusses how to avoid risk for bone loss.  Biology of Weight Control  Clinical staff conducted group or individual video education with verbal and written material and guidebook.  Patient learns that weight gain occurs because we consume more calories than we burn (eating more, moving less). Even if your body weight is normal, you may have higher ratios of fat compared to muscle mass. Too much body fat puts you at increased risk for cardiovascular disease, heart attack, stroke, type 2 diabetes, and obesity-related cancers. In addition to exercise, following the Pritikin Eating Plan can help reduce your risk.  Decoding Lab Results  Clinical staff conducted group or individual video education  with verbal and written material and guidebook.  Patient learns that lab test reflects one measurement whose values change over time and are influenced by many factors, including medication, stress, sleep, exercise, food, hydration, pre-existing medical conditions, and more. It is recommended to use the knowledge from this video to become more involved with your lab results and evaluate your numbers to speak with your doctor.   Diseases of Our Time -  Overview  Clinical staff conducted group or individual video education with verbal and written material and guidebook.  Patient learns that according to the CDC, 50% to 70% of chronic diseases (such as obesity, type 2 diabetes, elevated lipids, hypertension, and heart disease) are avoidable through lifestyle improvements including healthier food choices, listening to satiety cues, and increased physical activity.  Sleep Disorders Clinical staff conducted group or individual video education with verbal and written material and guidebook.  Patient learns how good quality and duration of sleep are important to overall health and well-being. Patient also learns about sleep disorders and how they impact health along with recommendations to address them, including discussing with a physician.  Nutrition  Dining Out - Part 2 Clinical staff conducted group or individual video education with verbal and written material and guidebook.  Patient learns how to plan ahead and communicate in order to maximize their dining experience in a healthy and nutritious manner. Included are recommended food choices based on the type of restaurant the patient is visiting.   Fueling a Banker conducted group or individual video education with verbal and written material and guidebook.  There is a strong connection between our food choices and our health. Diseases like obesity and type 2 diabetes are very prevalent and are in large-part due to lifestyle choices. The Pritikin Eating Plan provides plenty of food and hunger-curbing satisfaction. It is easy to follow, affordable, and helps reduce health risks.  Menu Workshop  Clinical staff conducted group or individual video education with verbal and written material and guidebook.  Patient learns that restaurant meals can sabotage health goals because they are often packed with calories, fat, sodium, and sugar. Recommendations include strategies to plan  ahead and to communicate with the manager, chef, or server to help order a healthier meal.  Planning Your Eating Strategy  Clinical staff conducted group or individual video education with verbal and written material and guidebook.  Patient learns about the Pritikin Eating Plan and its benefit of reducing the risk of disease. The Pritikin Eating Plan does not focus on calories. Instead, it emphasizes high-quality, nutrient-rich foods. By knowing the characteristics of the foods, we choose, we can determine their calorie density and make informed decisions.  Targeting Your Nutrition Priorities  Clinical staff conducted group or individual video education with verbal and written material and guidebook.  Patient learns that lifestyle habits have a tremendous impact on disease risk and progression. This video provides eating and physical activity recommendations based on your personal health goals, such as reducing LDL cholesterol, losing weight, preventing or controlling type 2 diabetes, and reducing high blood pressure.  Vitamins and Minerals  Clinical staff conducted group or individual video education with verbal and written material and guidebook.  Patient learns different ways to obtain key vitamins and minerals, including through a recommended healthy diet. It is important to discuss all supplements you take with your doctor.   Healthy Mind-Set    Smoking Cessation  Clinical staff conducted group or individual video education with verbal and written material  and guidebook.  Patient learns that cigarette smoking and tobacco addiction pose a serious health risk which affects millions of people. Stopping smoking will significantly reduce the risk of heart disease, lung disease, and many forms of cancer. Recommended strategies for quitting are covered, including working with your doctor to develop a successful plan.  Culinary   Becoming a Set designer conducted group or  individual video education with verbal and written material and guidebook.  Patient learns that cooking at home can be healthy, cost-effective, quick, and puts them in control. Keys to cooking healthy recipes will include looking at your recipe, assessing your equipment needs, planning ahead, making it simple, choosing cost-effective seasonal ingredients, and limiting the use of added fats, salts, and sugars.  Cooking - Breakfast and Snacks  Clinical staff conducted group or individual video education with verbal and written material and guidebook.  Patient learns how important breakfast is to satiety and nutrition through the entire day. Recommendations include key foods to eat during breakfast to help stabilize blood sugar levels and to prevent overeating at meals later in the day. Planning ahead is also a key component.  Cooking - Educational psychologist conducted group or individual video education with verbal and written material and guidebook.  Patient learns eating strategies to improve overall health, including an approach to cook more at home. Recommendations include thinking of animal protein as a side on your plate rather than center stage and focusing instead on lower calorie dense options like vegetables, fruits, whole grains, and plant-based proteins, such as beans. Making sauces in large quantities to freeze for later and leaving the skin on your vegetables are also recommended to maximize your experience.  Cooking - Healthy Salads and Dressing Clinical staff conducted group or individual video education with verbal and written material and guidebook.  Patient learns that vegetables, fruits, whole grains, and legumes are the foundations of the Pritikin Eating Plan. Recommendations include how to incorporate each of these in flavorful and healthy salads, and how to create homemade salad dressings. Proper handling of ingredients is also covered. Cooking - Soups and [employer] - Soups and Desserts Clinical staff conducted group or individual video education with verbal and written material and guidebook.  Patient learns that Pritikin soups and desserts make for easy, nutritious, and delicious snacks and meal components that are low in sodium, fat, sugar, and calorie density, while high in vitamins, minerals, and filling fiber. Recommendations include simple and healthy ideas for soups and desserts.   Overview     The Pritikin Solution Program Overview Clinical staff conducted group or individual video education with verbal and written material and guidebook.  Patient learns that the results of the Pritikin Program have been documented in more than 100 articles published in peer-reviewed journals, and the benefits include reducing risk factors for (and, in some cases, even reversing) high cholesterol, high blood pressure, type 2 diabetes, obesity, and more! An overview of the three key pillars of the Pritikin Program will be covered: eating well, doing regular exercise, and having a healthy mind-set.  WORKSHOPS  Exercise: Exercise Basics: Building Your Action Plan Clinical staff led group instruction and group discussion with PowerPoint presentation and patient guidebook. To enhance the learning environment the use of posters, models and videos may be added. At the conclusion of this workshop, patients will comprehend the difference between physical activity and exercise, as well as the benefits of incorporating both, into their routine. Patients  will understand the FITT (Frequency, Intensity, Time, and Type) principle and how to use it to build an exercise action plan. In addition, safety concerns and other considerations for exercise and cardiac rehab will be addressed by the presenter. The purpose of this lesson is to promote a comprehensive and effective weekly exercise routine in order to improve patients' overall level of fitness.   Managing Heart  Disease: Your Path to a Healthier Heart Clinical staff led group instruction and group discussion with PowerPoint presentation and patient guidebook. To enhance the learning environment the use of posters, models and videos may be added.At the conclusion of this workshop, patients will understand the anatomy and physiology of the heart. Additionally, they will understand how Pritikin's three pillars impact the risk factors, the progression, and the management of heart disease.  The purpose of this lesson is to provide a high-level overview of the heart, heart disease, and how the Pritikin lifestyle positively impacts risk factors.  Exercise Biomechanics Clinical staff led group instruction and group discussion with PowerPoint presentation and patient guidebook. To enhance the learning environment the use of posters, models and videos may be added. Patients will learn how the structural parts of their bodies function and how these functions impact their daily activities, movement, and exercise. Patients will learn how to promote a neutral spine, learn how to manage pain, and identify ways to improve their physical movement in order to promote healthy living. The purpose of this lesson is to expose patients to common physical limitations that impact physical activity. Participants will learn practical ways to adapt and manage aches and pains, and to minimize their effect on regular exercise. Patients will learn how to maintain good posture while sitting, walking, and lifting.  Balance Training and Fall Prevention  Clinical staff led group instruction and group discussion with PowerPoint presentation and patient guidebook. To enhance the learning environment the use of posters, models and videos may be added. At the conclusion of this workshop, patients will understand the importance of their sensorimotor skills (vision, proprioception, and the vestibular system) in maintaining their ability to  balance as they age. Patients will apply a variety of balancing exercises that are appropriate for their current level of function. Patients will understand the common causes for poor balance, possible solutions to these problems, and ways to modify their physical environment in order to minimize their fall risk. The purpose of this lesson is to teach patients about the importance of maintaining balance as they age and ways to minimize their risk of falling.  WORKSHOPS   Nutrition:  Fueling a Ship broker led group instruction and group discussion with PowerPoint presentation and patient guidebook. To enhance the learning environment the use of posters, models and videos may be added. Patients will review the foundational principles of the Pritikin Eating Plan and understand what constitutes a serving size in each of the food groups. Patients will also learn Pritikin-friendly foods that are better choices when away from home and review make-ahead meal and snack options. Calorie density will be reviewed and applied to three nutrition priorities: weight maintenance, weight loss, and weight gain. The purpose of this lesson is to reinforce (in a group setting) the key concepts around what patients are recommended to eat and how to apply these guidelines when away from home by planning and selecting Pritikin-friendly options. Patients will understand how calorie density may be adjusted for different weight management goals.  Mindful Eating  Clinical staff led group instruction and group  discussion with PowerPoint presentation and patient guidebook. To enhance the learning environment the use of posters, models and videos may be added. Patients will briefly review the concepts of the Pritikin Eating Plan and the importance of low-calorie dense foods. The concept of mindful eating will be introduced as well as the importance of paying attention to internal hunger signals. Triggers for non-hunger  eating and techniques for dealing with triggers will be explored. The purpose of this lesson is to provide patients with the opportunity to review the basic principles of the Pritikin Eating Plan, discuss the value of eating mindfully and how to measure internal cues of hunger and fullness using the Hunger Scale. Patients will also discuss reasons for non-hunger eating and learn strategies to use for controlling emotional eating.  Targeting Your Nutrition Priorities Clinical staff led group instruction and group discussion with PowerPoint presentation and patient guidebook. To enhance the learning environment the use of posters, models and videos may be added. Patients will learn how to determine their genetic susceptibility to disease by reviewing their family history. Patients will gain insight into the importance of diet as part of an overall healthy lifestyle in mitigating the impact of genetics and other environmental insults. The purpose of this lesson is to provide patients with the opportunity to assess their personal nutrition priorities by looking at their family history, their own health history and current risk factors. Patients will also be able to discuss ways of prioritizing and modifying the Pritikin Eating Plan for their highest risk areas  Menu  Clinical staff led group instruction and group discussion with PowerPoint presentation and patient guidebook. To enhance the learning environment the use of posters, models and videos may be added. Using menus brought in from [employer], or printed from [employer], patients will apply the Pritikin dining out guidelines that were presented in the [employer] video. Patients will also be able to practice these guidelines in a variety of provided scenarios. The purpose of this lesson is to provide patients with the opportunity to practice hands-on learning of the Pritikin Dining Out guidelines with actual menus and practice  scenarios.  Label Reading Clinical staff led group instruction and group discussion with PowerPoint presentation and patient guidebook. To enhance the learning environment the use of posters, models and videos may be added. Patients will review and discuss the Pritikin label reading guidelines presented in Pritikin's Label Reading Educational series video. Using fool labels brought in from local grocery stores and markets, patients will apply the label reading guidelines and determine if the packaged food meet the Pritikin guidelines. The purpose of this lesson is to provide patients with the opportunity to review, discuss, and practice hands-on learning of the Pritikin Label Reading guidelines with actual packaged food labels. Cooking School  Pritikin's [employer] are designed to teach patients ways to prepare quick, simple, and affordable recipes at home. The importance of nutrition's role in chronic disease risk reduction is reflected in its emphasis in the overall Pritikin program. By learning how to prepare essential core Pritikin Eating Plan recipes, patients will increase control over what they eat; be able to customize the flavor of foods without the use of added salt, sugar, or fat; and improve the quality of the food they consume. By learning a set of core recipes which are easily assembled, quickly prepared, and affordable, patients are more likely to prepare more healthy foods at home. These workshops focus on convenient breakfasts, simple entres, side dishes, and  desserts which can be prepared with minimal effort and are consistent with nutrition recommendations for cardiovascular risk reduction. Cooking [employer] are taught by a Armed forces logistics/support/administrative officer (RD) who has been trained by the [employer]. The chef or RD has a clear understanding of the importance of minimizing - if not completely eliminating - added fat, sugar, and sodium in recipes. Throughout  the series of Cooking School Workshop sessions, patients will learn about healthy ingredients and efficient methods of cooking to build confidence in their capability to prepare    Cooking School weekly topics:  Adding Flavor- Sodium-Free  Fast and Healthy Breakfasts  Powerhouse Plant-Based Proteins  Satisfying Salads and Dressings  Simple Sides and Sauces  International Cuisine-Spotlight on the [employer] Zones  Delicious Desserts  Savory Soups  [employer] - Meals in a Astronomer Appetizers and Snacks  Comforting Weekend Breakfasts  One-Pot Wonders   Fast Evening Meals  Landscape architect Your Pritikin Plate  WORKSHOPS   Healthy Mindset (Psychosocial):  Focused Goals, Sustainable Changes Clinical staff led group instruction and group discussion with PowerPoint presentation and patient guidebook. To enhance the learning environment the use of posters, models and videos may be added. Patients will be able to apply effective goal setting strategies to establish at least one personal goal, and then take consistent, meaningful action toward that goal. They will learn to identify common barriers to achieving personal goals and develop strategies to overcome them. Patients will also gain an understanding of how our mind-set can impact our ability to achieve goals and the importance of cultivating a positive and growth-oriented mind-set. The purpose of this lesson is to provide patients with a deeper understanding of how to set and achieve personal goals, as well as the tools and strategies needed to overcome common obstacles which may arise along the way.  From Head to Heart: The Power of a Healthy Outlook  Clinical staff led group instruction and group discussion with PowerPoint presentation and patient guidebook. To enhance the learning environment the use of posters, models and videos may be added. Patients will be able to recognize and describe the impact of emotions and  mood on physical health. They will discover the importance of self-care and explore self-care practices which may work for them. Patients will also learn how to utilize the 4 C's to cultivate a healthier outlook and better manage stress and challenges. The purpose of this lesson is to demonstrate to patients how a healthy outlook is an essential part of maintaining good health, especially as they continue their cardiac rehab journey.  Healthy Sleep for a Healthy Heart Clinical staff led group instruction and group discussion with PowerPoint presentation and patient guidebook. To enhance the learning environment the use of posters, models and videos may be added. At the conclusion of this workshop, patients will be able to demonstrate knowledge of the importance of sleep to overall health, well-being, and quality of life. They will understand the symptoms of, and treatments for, common sleep disorders. Patients will also be able to identify daytime and nighttime behaviors which impact sleep, and they will be able to apply these tools to help manage sleep-related challenges. The purpose of this lesson is to provide patients with a general overview of sleep and outline the importance of quality sleep. Patients will learn about a few of the most common sleep disorders. Patients will also be introduced to the concept of "sleep hygiene," and discover ways to self-manage certain sleeping  problems through simple daily behavior changes. Finally, the workshop will motivate patients by clarifying the links between quality sleep and their goals of heart-healthy living.   Recognizing and Reducing Stress Clinical staff led group instruction and group discussion with PowerPoint presentation and patient guidebook. To enhance the learning environment the use of posters, models and videos may be added. At the conclusion of this workshop, patients will be able to understand the types of stress reactions, differentiate between  acute and chronic stress, and recognize the impact that chronic stress has on their health. They will also be able to apply different coping mechanisms, such as reframing negative self-talk. Patients will have the opportunity to practice a variety of stress management techniques, such as deep abdominal breathing, progressive muscle relaxation, and/or guided imagery.  The purpose of this lesson is to educate patients on the role of stress in their lives and to provide healthy techniques for coping with it.  Learning Barriers/Preferences:  Learning Barriers/Preferences - 07/22/23 1345       Learning Barriers/Preferences   Learning Barriers Sight   wears glasses   Learning Preferences Written Material;Skilled Demonstration             Education Topics:  Knowledge Questionnaire Score:  Knowledge Questionnaire Score - 07/22/23 1351       Knowledge Questionnaire Score   Pre Score 15/24             Core Components/Risk Factors/Patient Goals at Admission:  Personal Goals and Risk Factors at Admission - 07/27/23 1127       Core Components/Risk Factors/Patient Goals on Admission    Weight Management Yes;Weight Gain    Intervention Weight Management: Develop a combined nutrition and exercise program designed to reach desired caloric intake, while maintaining appropriate intake of nutrient and fiber, sodium and fats, and appropriate energy expenditure required for the weight goal.;Weight Management: Provide education and appropriate resources to help participant work on and attain dietary goals.    Expected Outcomes Short Term: Continue to assess and modify interventions until short term weight is achieved;Long Term: Adherence to nutrition and physical activity/exercise program aimed toward attainment of established weight goal;Weight Maintenance: Understanding of the daily nutrition guidelines, which includes 25-35% calories from fat, 7% or less cal from saturated fats, less than 200mg   cholesterol, less than 1.5gm of sodium, & 5 or more servings of fruits and vegetables daily;Understanding recommendations for meals to include 15-35% energy as protein, 25-35% energy from fat, 35-60% energy from carbohydrates, less than 200mg  of dietary cholesterol, 20-35 gm of total fiber daily;Understanding of distribution of calorie intake throughout the day with the consumption of 4-5 meals/snacks;Weight Gain: Understanding of general recommendations for a high calorie, high protein meal plan that promotes weight gain by distributing calorie intake throughout the day with the consumption for 4-5 meals, snacks, and/or supplements    Improve shortness of breath with ADL's Yes    Intervention Provide education, individualized exercise plan and daily activity instruction to help decrease symptoms of SOB with activities of daily living.    Expected Outcomes Short Term: Improve cardiorespiratory fitness to achieve a reduction of symptoms when performing ADLs;Long Term: Be able to perform more ADLs without symptoms or delay the onset of symptoms    Diabetes Yes    Intervention Provide education about signs/symptoms and action to take for hypo/hyperglycemia.;Provide education about proper nutrition, including hydration, and aerobic/resistive exercise prescription along with prescribed medications to achieve blood glucose in normal ranges: Fasting glucose 65-99 mg/dL  Expected Outcomes Short Term: Participant verbalizes understanding of the signs/symptoms and immediate care of hyper/hypoglycemia, proper foot care and importance of medication, aerobic/resistive exercise and nutrition plan for blood glucose control.;Long Term: Attainment of HbA1C < 7%.    Hypertension Yes    Intervention Provide education on lifestyle modifcations including regular physical activity/exercise, weight management, moderate sodium restriction and increased consumption of fresh fruit, vegetables, and low fat dairy, alcohol moderation,  and smoking cessation.;Monitor prescription use compliance.    Expected Outcomes Short Term: Continued assessment and intervention until BP is < 140/47mm HG in hypertensive participants. < 130/92mm HG in hypertensive participants with diabetes, heart failure or chronic kidney disease.;Long Term: Maintenance of blood pressure at goal levels.    Lipids Yes    Intervention Provide education and support for participant on nutrition & aerobic/resistive exercise along with prescribed medications to achieve LDL 70mg , HDL >40mg .    Expected Outcomes Short Term: Participant states understanding of desired cholesterol values and is compliant with medications prescribed. Participant is following exercise prescription and nutrition guidelines.;Long Term: Cholesterol controlled with medications as prescribed, with individualized exercise RX and with personalized nutrition plan. Value goals: LDL < 70mg , HDL > 40 mg.             Core Components/Risk Factors/Patient Goals Review:   Goals and Risk Factor Review     Row Name 05/05/23 1133 06/07/23 0853 07/27/23 1129 08/10/23 1755       Core Components/Risk Factors/Patient Goals Review   Personal Goals Review Improve shortness of breath with ADL's;Develop more efficient breathing techniques such as purse lipped breathing and diaphragmatic breathing and practicing self-pacing with activity. Improve shortness of breath with ADL's;Develop more efficient breathing techniques such as purse lipped breathing and diaphragmatic breathing and practicing self-pacing with activity. Weight Management/Obesity;Lipids;Hypertension;Diabetes;Improve shortness of breath with ADL's Weight Management/Obesity;Lipids;Hypertension;Diabetes;Improve shortness of breath with ADL's    Review Tyler Hendricks has attended 4 sessions so far. Goal progressing for improving shortness of breath with ADL's. Goal progressing for developing more efficient breathing techniques such as purse lipped breathing  and diaphragmatic breathing; and practicing self-pacing with activity.  Tyler Hendricks is currently requiring 2-4 L while exercising to keep sats >88%. He is currently exercising on the Treadmill and the bike. We will continue to monitor her progress throughout the program. Tyler Hendricks was discharged from the program on 06/03/23 due to an upcoming surgery on 06/08/23. He did meet his goal for developing more efficient breathing techniques such as purse lipped breathing and diaphragmatic breathing; and practicing self-pacing with activity. He was able to demonstrate purse lip breathing when he was SOB. He also knew how to self pace himself based on his rate of perceived exertion scale. Goal was not met for improving his shortness of breath with ADL's. Tyler Hendricks started cardiac rehab on 07/25/23. Vital signs were stable. Weight down 3.3 kg.  Oxygen saturations stable on 2l/min Tyler Hendricks started cardiac rehab on 07/25/23. Vital signs have been stable. Weight down 0.9 kg.  Oxygen saturations stable on 2l/min    Expected Outcomes To improve shortness of breath with ADL's and develop more efficient breathing techniques such as purse lipped breathing and diaphragmatic breathing; and practicing self-pacing with activity. Patient will continue to benefit from ongoing nutrition, exercise, and lifestyle modification Tyler Hendricks will continue to participate in cardiac rehab for exercise, nutrition and lifestyle modificaitons Tyler Hendricks will continue to participate in cardiac rehab for exercise, nutrition and lifestyle modificaitons             Core Components/Risk Factors/Patient  Goals at Discharge (Final Review):   Goals and Risk Factor Review - 08/10/23 1755       Core Components/Risk Factors/Patient Goals Review   Personal Goals Review Weight Management/Obesity;Lipids;Hypertension;Diabetes;Improve shortness of breath with ADL's    Review Tyler Hendricks started cardiac rehab on 07/25/23. Vital signs have been stable. Weight down 0.9 kg.  Oxygen  saturations stable on 2l/min    Expected Outcomes Tyler Hendricks will continue to participate in cardiac rehab for exercise, nutrition and lifestyle modificaitons             ITP Comments:  ITP Comments     Row Name 07/22/23 1104 07/27/23 1100 08/10/23 1753       ITP Comments Gaylyn Keas, MD: Medical Director. Introduction to the [facility]/Intensive Cardiac Rehab. Initial orientation packet reviewed with the patient. 30 Day ITP Review. Mylo started cardiac rehab on 07/27/23. [place] did well with exercise. 30 Day ITP Review. Alexanderjames started cardiac rehab on 07/27/23. Armel is off to a good start with exercise.              Comments: See ITP comments.Monte Antonio RN BSN

## 2023-08-10 NOTE — Progress Notes (Signed)
 Specialty Pharmacy Refill Coordination Note  Tyler Hendricks is a 73 y.o. male contacted today regarding refills of specialty medication(s) Bictegravir-Emtricitab-Tenofov (Biktarvy )   Patient requested (Patient-Rptd) Delivery   Delivery date: (Patient-Rptd) 08/13/23   Verified address: (Patient-Rptd) [address] [place] [address]   Medication will be filled on 08/12/23.

## 2023-08-11 ENCOUNTER — Telehealth (HOSPITAL_COMMUNITY): Payer: Self-pay

## 2023-08-11 ENCOUNTER — Encounter (HOSPITAL_COMMUNITY): Admission: RE | Admit: 2023-08-11 | Source: Ambulatory Visit

## 2023-08-11 NOTE — Telephone Encounter (Signed)
 Patient c/o for 12:30pm class, confirmed he was not sick but gave no other reason.

## 2023-08-12 ENCOUNTER — Other Ambulatory Visit: Payer: Self-pay

## 2023-08-13 ENCOUNTER — Encounter (HOSPITAL_COMMUNITY)
Admission: RE | Admit: 2023-08-13 | Discharge: 2023-08-13 | Disposition: A | Discharge status: Home / Self Care | Source: Ambulatory Visit | Attending: Cardiology | Admitting: Cardiology

## 2023-08-13 DIAGNOSIS — J9611 Chronic respiratory failure with hypoxia: Secondary | ICD-10-CM | POA: Diagnosis not present

## 2023-08-13 DIAGNOSIS — Z952 Presence of prosthetic heart valve: Secondary | ICD-10-CM

## 2023-08-13 DIAGNOSIS — J849 Interstitial pulmonary disease, unspecified: Secondary | ICD-10-CM | POA: Diagnosis not present

## 2023-08-17 ENCOUNTER — Other Ambulatory Visit: Payer: Self-pay | Admitting: Pulmonary Disease

## 2023-08-17 ENCOUNTER — Ambulatory Visit: Admitting: Cardiology

## 2023-08-17 DIAGNOSIS — J84112 Idiopathic pulmonary fibrosis: Secondary | ICD-10-CM

## 2023-08-17 LAB — GLUCOSE, CAPILLARY: Glucose-Capillary: 160 mg/dL — ABNORMAL HIGH (ref 70–99)

## 2023-08-17 NOTE — Telephone Encounter (Signed)
 Refill sent for OFEV  to [facility] (pulmonary fibrosis team): [phone]  Dose: 100mg  three times daily  Last OV: 07/20/23 Provider: Roena Clark, NP Pertinent labs: LFTs on 06/04/23 wnl  Next OV: 10/18/23  Geraldene Kleine, PharmD, MPH, BCPS Clinical Pharmacist (Rheumatology and Pulmonology)

## 2023-08-18 ENCOUNTER — Encounter (HOSPITAL_COMMUNITY)

## 2023-08-20 ENCOUNTER — Encounter (HOSPITAL_COMMUNITY)

## 2023-08-23 ENCOUNTER — Encounter (HOSPITAL_COMMUNITY): Admission: RE | Admit: 2023-08-23 | Source: Ambulatory Visit

## 2023-08-23 ENCOUNTER — Telehealth (HOSPITAL_COMMUNITY): Payer: Self-pay | Admitting: *Deleted

## 2023-08-23 NOTE — Telephone Encounter (Signed)
 Left message to call cardiac rehab.Thayer Headings RN BSN

## 2023-08-25 ENCOUNTER — Telehealth (HOSPITAL_COMMUNITY): Payer: Self-pay

## 2023-08-25 ENCOUNTER — Encounter (HOSPITAL_COMMUNITY): Admission: RE | Admit: 2023-08-25 | Source: Ambulatory Visit

## 2023-08-25 ENCOUNTER — Ambulatory Visit: Payer: Self-pay | Admitting: Pulmonary Disease

## 2023-08-25 ENCOUNTER — Telehealth: Payer: Self-pay | Admitting: Pulmonary Disease

## 2023-08-25 DIAGNOSIS — R053 Chronic cough: Secondary | ICD-10-CM

## 2023-08-25 NOTE — Telephone Encounter (Signed)
 Copied from CRM [id number]. Topic: Clinical - Medication Refill >> Aug 25, 2023  8:06 AM Whitney O wrote: Medication: HYDROcodone  bit-homatropine (HYCODAN ) 5-1.5 MG/5ML syrup   Has the patient contacted their pharmacy? Yes I called and they have it  (Agent: If no, request that the patient contact the pharmacy for the refill. If patient does not wish to contact the pharmacy document the reason why and proceed with request.) (Agent: If yes, when and what did the pharmacy advise?)  This is the patient's preferred pharmacy:  [id number] [name], [place] - [address] [place] [address] Phone: [phone] Fax: [phone]  [facility] [place], [place] - [address] [place] [address] Phone: [phone] Fax: [phone]  [facility] [place] [place] - [address] [place] [address] Phone: [phone] Fax: [phone]  [facility] Sp[facility] - [facility] [address] [place] [address] Phone: [phone] Fax: [phone]  [id number] [name], [place] - [address] AT [facility] [address] [facility] [address] [place] [address] Phone: [phone] Fax: [phone]  Is this the correct pharmacy for this prescription? Yes If no, delete pharmacy and type the correct one.  [id number] [name], [place] - [address] [place] [address] Phone: [phone] Fax: [phone]   Has the prescription been filled recently? No  Is the patient out of the medication? Yes  Has the patient been seen for an appointment in the last year OR does the patient have an upcoming appointment? Yes  Can we respond through MyChart? No  Agent: Please be advised that Rx refills may take up to 3 business days. We ask that you follow-up  with your pharmacy.

## 2023-08-25 NOTE — Telephone Encounter (Signed)
 Please advise Dr. Waylan Haggard pt is requesting Hycodan  refill

## 2023-08-25 NOTE — Telephone Encounter (Addendum)
 Per another nurse, pt called back to primary care, instead of pulm office as instructed in this nurse's VM. Other triage nurse triaged pt, ensured no new or worsening symptoms, just needing refill of Hycodan  cough syrup. Please advise.

## 2023-08-25 NOTE — Telephone Encounter (Addendum)
 First attempt to reach pt to triage for refill request of Hycodan  cough syrup. LVM for call back. Placed in call back.  Copied from CRM [id number]. Topic: Clinical - Medication Refill >> Aug 25, 2023  8:06 AM Whitney O wrote: Medication: HYDROcodone  bit-homatropine (HYCODAN ) 5-1.5 MG/5ML syrup     Has the patient contacted their pharmacy? Yes I called and they have it  (Agent: If no, request that the patient contact the pharmacy for the refill. If patient does not wish to contact the pharmacy document the reason why and proceed with request.) (Agent: If yes, when and what did the pharmacy advise?)   This is the patient's preferred pharmacy:  [id number] [name], [place] - [address] [facility] [address] [address] [place] [address] Phone: [phone] Fax: [phone]   [facility] [place], [place] - [address] [place] [address] Phone: [phone] Fax: [phone]   [facility] [place] [facility], [place] - [address] [place] [address] Phone: [phone] Fax: [phone]   Melode[place] Spruce [facility] - [facility] [address] [place] [address] Phone: [phone] Fax: [phone]   [facility][id number] [name], [place] - [address] AT [facility] [address] & [facility] [address] [place] [address] Phone: [phone] Fax: [phone]   Is this the correct pharmacy for this prescription? Yes If no, delete pharmacy and type the correct one.  [id number] [name], [place] - [address] [facility] [address] [address] [place] [address] Phone: [phone] Fax: [phone]     Has the prescription been filled recently? No   Is the patient out of the medication? Yes   Has the patient been seen for an appointment in the last year OR does the patient have an upcoming appointment? Yes    Can we respond through MyChart? No   Agent: Please be advised that Rx refills may take up to 3 business days. We ask that you follow-up with your pharmacy.

## 2023-08-25 NOTE — Telephone Encounter (Signed)
 Opened triage encounter for pt, see other nurse note from 6/4.

## 2023-08-25 NOTE — Telephone Encounter (Signed)
 Patient c/o for 12:30pm class, states he told someone when he was here last that he was going out of town and would return Friday 6/06.

## 2023-08-25 NOTE — Telephone Encounter (Signed)
 Pt accidentally called the wrong practice.  He was trying to contact his pulmonary provider and got Dr. Constance Dellen practice instead.   Complete triage done however nothing is needed from Dr. Nicolette Barrio.  He was just requesting a refill of the hycodan  cough medication he takes for his pulmonary fibrosis that is prescribed by his pulmonary provider.   This is just an [place].     FYI Only or Action Required?: Action required by provider  Patient was last seen in primary care on 12/10/2022 by Colene Dauphin, MD. Called Nurse Triage reporting Cough. Symptoms began this is chronic for him.   Has pulmonary fibrosis.   He called his PCP and meant to call his pulmonary provider. Interventions attempted: Prescription medications: Needs a refill of his Hycodan  cough medication from his pulmonary provider.. Symptoms are: stable.  Triage Disposition:  Call pulmonary provider for cough medication refill.   Called wrong office.   Called PCP instead of pulmonary.    The refill request for the Hycodan  cough medication has been submitted to [facility] at [facility].    Patient/caregiver understands and will follow disposition?: Reason for Disposition  [1] Continuous (nonstop) coughing interferes with work or school AND [2] no improvement using cough treatment per Care Advice  Answer Assessment - Initial Assessment Questions 1. ONSET: "When did the cough begin?"      I have a bad cough.    I have pulmonary fibrosis.   I just have a cough.   It's a constant cough.   It's just a small amount clear mucus. 2. SEVERITY: "How bad is the cough today?"      It's just my usual cough. 3. SPUTUM: "Describe the color of your sputum" (none, dry cough; clear, white, yellow, green)     Scant amount clear mucus. Every time I start coughing it kicks in.   Pt. Is coughing constantly while talking with me.    4. HEMOPTYSIS: "Are you coughing up any blood?" If so ask: "How much?" (flecks, streaks, tablespoons, etc.)     Not  asked 5. DIFFICULTY BREATHING: "Are you having difficulty breathing?" If Yes, ask: "How bad is it?" (e.g., mild, moderate, severe)    - MILD: No SOB at rest, mild SOB with walking, speaks normally in sentences, can lie down, no retractions, pulse < 100.    - MODERATE: SOB at rest, SOB with minimal exertion and prefers to sit, cannot lie down flat, speaks in phrases, mild retractions, audible wheezing, pulse 100-120.    - SEVERE: Very SOB at rest, speaks in single words, struggling to breathe, sitting hunched forward, retractions, pulse > 120      The cough is not getting worse.   It's just a constant cough. 6. FEVER: "Do you have a fever?" If Yes, ask: "What is your temperature, how was it measured, and when did it start?"     NO I'm trying to reach my pulmonary doctor.    7. CARDIAC HISTORY: "Do you have any history of heart disease?" (e.g., heart attack, congestive heart failure)      Not asked 8. LUNG HISTORY: "Do you have any history of lung disease?"  (e.g., pulmonary embolus, asthma, emphysema)     I have pulmonary fibrosis 9. PE RISK FACTORS: "Do you have a history of blood clots?" (or: recent major surgery, recent prolonged travel, bedridden)     Not asked 10. OTHER SYMPTOMS: "Do you have any other symptoms?" (e.g., runny nose, wheezing, chest pain)  Not asked 11. PREGNANCY: "Is there any chance you are pregnant?" "When was your last menstrual period?"       N/A 12. TRAVEL: "Have you traveled out of the country in the last month?" (e.g., travel history, exposures)       N/A  Protocols used: Cough - Acute Productive-A-AH

## 2023-08-26 ENCOUNTER — Other Ambulatory Visit: Payer: Self-pay

## 2023-08-26 MED ORDER — HYDROCODONE BIT-HOMATROP MBR 5-1.5 MG/5ML PO SOLN
5.0000 mL | Freq: Four times a day (QID) | ORAL | 0 refills | Status: DC | PRN
Start: 2023-08-26 — End: 2023-09-20

## 2023-08-26 NOTE — Telephone Encounter (Signed)
 Mychart msg sent to pt letting him know that the refill was sent.

## 2023-08-26 NOTE — Telephone Encounter (Signed)
 Lm for pt

## 2023-08-26 NOTE — Telephone Encounter (Signed)
 Hycodan  prescription has been sent to his pharmacy- [facility] on [address] and [address]

## 2023-08-27 ENCOUNTER — Encounter (HOSPITAL_COMMUNITY): Admission: RE | Admit: 2023-08-27 | Source: Ambulatory Visit

## 2023-08-27 ENCOUNTER — Telehealth (HOSPITAL_COMMUNITY): Payer: Self-pay

## 2023-08-27 NOTE — Telephone Encounter (Signed)
 Patient called returning an inquiry from [place] regarding his absence from class today. When I spoke to him on 6/04 he had stated he was out of town and would be back today 6/06. He now states he will return to class on Monday 6/09.

## 2023-08-30 ENCOUNTER — Telehealth (HOSPITAL_COMMUNITY): Payer: Self-pay | Admitting: *Deleted

## 2023-08-30 ENCOUNTER — Encounter (HOSPITAL_COMMUNITY)
Admission: RE | Admit: 2023-08-30 | Discharge: 2023-08-30 | Disposition: A | Discharge status: Home / Self Care | Source: Ambulatory Visit | Attending: Cardiology | Admitting: Cardiology

## 2023-08-30 DIAGNOSIS — Z48812 Encounter for surgical aftercare following surgery on the circulatory system: Secondary | ICD-10-CM | POA: Insufficient documentation

## 2023-08-30 DIAGNOSIS — Z79899 Other long term (current) drug therapy: Secondary | ICD-10-CM | POA: Diagnosis not present

## 2023-08-30 DIAGNOSIS — Z952 Presence of prosthetic heart valve: Secondary | ICD-10-CM | POA: Insufficient documentation

## 2023-08-30 DIAGNOSIS — Z954 Presence of other heart-valve replacement: Secondary | ICD-10-CM | POA: Diagnosis not present

## 2023-08-30 DIAGNOSIS — J8410 Pulmonary fibrosis, unspecified: Secondary | ICD-10-CM | POA: Diagnosis not present

## 2023-08-30 DIAGNOSIS — M48061 Spinal stenosis, lumbar region without neurogenic claudication: Secondary | ICD-10-CM | POA: Diagnosis not present

## 2023-08-30 DIAGNOSIS — M51362 Other intervertebral disc degeneration, lumbar region with discogenic back pain and lower extremity pain: Secondary | ICD-10-CM | POA: Diagnosis not present

## 2023-08-30 DIAGNOSIS — G8929 Other chronic pain: Secondary | ICD-10-CM | POA: Diagnosis not present

## 2023-08-30 NOTE — Telephone Encounter (Signed)
 Left message to call cardiac rehab.Thayer Headings RN BSN

## 2023-09-01 ENCOUNTER — Encounter (HOSPITAL_COMMUNITY)
Admission: RE | Admit: 2023-09-01 | Discharge: 2023-09-01 | Disposition: A | Discharge status: Home / Self Care | Source: Ambulatory Visit | Attending: Cardiology | Admitting: Cardiology

## 2023-09-01 DIAGNOSIS — Z48812 Encounter for surgical aftercare following surgery on the circulatory system: Secondary | ICD-10-CM | POA: Diagnosis not present

## 2023-09-01 DIAGNOSIS — Z952 Presence of prosthetic heart valve: Secondary | ICD-10-CM

## 2023-09-03 ENCOUNTER — Encounter (HOSPITAL_COMMUNITY)

## 2023-09-06 ENCOUNTER — Encounter (HOSPITAL_COMMUNITY)

## 2023-09-06 ENCOUNTER — Telehealth (HOSPITAL_COMMUNITY): Payer: Self-pay

## 2023-09-06 ENCOUNTER — Encounter (HOSPITAL_COMMUNITY)
Admission: RE | Admit: 2023-09-06 | Discharge: 2023-09-06 | Disposition: A | Discharge status: Home / Self Care | Source: Ambulatory Visit | Attending: Cardiology | Admitting: Cardiology

## 2023-09-06 DIAGNOSIS — Z952 Presence of prosthetic heart valve: Secondary | ICD-10-CM | POA: Diagnosis not present

## 2023-09-06 DIAGNOSIS — Z48812 Encounter for surgical aftercare following surgery on the circulatory system: Secondary | ICD-10-CM | POA: Diagnosis not present

## 2023-09-06 NOTE — Progress Notes (Signed)
 Patient was noted to have a skin tear on his left outer forearm a 1/2 inch in diameter. Presumptively during exercise on the nustep. I offered to clean with soap and water. Patient declined and said he will have his wife take a look at it when he gets home. The skin tear is 1/2 inch in diameter. A Band-Aid was applied. [place] decided to leave early. I advised [place] to wear long sleeve shirts when exercising. Patient states understanding and left cardiac rehab without further complaints.Monte Antonio RN BSN

## 2023-09-06 NOTE — Telephone Encounter (Signed)
-----   Message from Neola C sent at 09/03/2023  8:55 AM EDT ----- Regarding: Pt called out Hey,  Pt called and stated he will not able to attend CR today no reason given.  Thanks, Camilo Cella

## 2023-09-07 NOTE — Progress Notes (Signed)
 QUALITY OF LIFE SCORE REVIEW  Pt completed Quality of Life survey as a participant in Cardiac Rehab.  Scores 19.0 or below are considered low.  Pt score very low in several areas Overall 20.58, Health and Function 18.43, socioeconomic 17.4, physiological and spiritual 24.21, family 26.25. Patient quality of life slightly altered by physical constraints which limits ability to perform as prior to recent cardiac illness.  Tyler Hendricks denies being depressed currently. Tyler Hendricks says that he has difficulty sleeping due to his sleep apnea and waiting for his CPAP machine to be serviced. Tyler Hendricks says he has had some improvement in his shortness of breath since he has been participating in cardiac rehab.Offered emotional support and reassurance.  Will continue to monitor and intervene as necessary.Monte Antonio RN BSN

## 2023-09-07 NOTE — Progress Notes (Signed)
 Cardiac Individual Treatment Plan  Patient Details  Name: Tyler Hendricks MRN: [id number] Date of Birth: [birth year] Referring Provider:   Flowsheet Row INTENSIVE CARDIAC REHAB ORIENT from 07/22/2023 in [facility]  Referring Provider Fransico Ivy, MD    Initial Encounter Date:  Flowsheet Row INTENSIVE CARDIAC REHAB ORIENT from 07/22/2023 in [facility]  Date 07/22/23    Visit Diagnosis: 06/08/23 S/P TAVR (transcatheter aortic valve replacement)  Patient's Home Medications on Admission:  Current Outpatient Medications:    amLODipine  (NORVASC ) 10 MG tablet, Take 1 tablet (10 mg total) by mouth daily., Disp: 180 tablet, Rfl: 3   atenolol  (TENORMIN ) 50 MG tablet, TAKE 1 TABLET BY MOUTH EVERY DAY, Disp: 90 tablet, Rfl: 3   bictegravir-emtricitabine -tenofovir  AF (BIKTARVY ) 50-200-25 MG TABS tablet, Take 1 tablet by mouth daily., Disp: 30 tablet, Rfl: 11   cholecalciferol (VITAMIN D3) 25 MCG (1000 UNIT) tablet, Take 1,000 Units by mouth daily., Disp: , Rfl:    clopidogrel  (PLAVIX ) 75 MG tablet, TAKE 1 TABLET BY MOUTH EVERY DAY, Disp: 90 tablet, Rfl: 3   diazepam  (VALIUM ) 10 MG tablet, Take 1 tablet (10 mg total) by mouth as directed. Take 1/2 a tablet 1 hour prior to MRI. If needed take other half. Please have a driver drive you to and from MRI, Disp: 1 tablet, Rfl: 0   HYDROcodone  bit-homatropine (HYCODAN ) 5-1.5 MG/5ML syrup, Take 5 mLs by mouth every 6 (six) hours as needed (chronic cough - pulmonary fibrosis)., Disp: 473 mL, Rfl: 0   losartan -hydrochlorothiazide  (HYZAAR ) 100-25 MG tablet, Take 1 tablet by mouth daily., Disp: 90 tablet, Rfl: 3   Multiple Vitamin (MULTIVITAMIN) tablet, Take 1 tablet by mouth daily., Disp: , Rfl:    OFEV  100 MG CAPS, TAKE 1 CAPSULE BY MOUTH 2 TIMES A DAY WITH FOOD. TAKE 12 HOURS APART., Disp: 180 capsule, Rfl: 1   Oxycodone  HCl 10 MG TABS, Take 10  mg by mouth 3 (three) times daily as needed (Pain)., Disp: , Rfl:    rosuvastatin  (CRESTOR ) 10 MG tablet, Take 1 tablet (10 mg total) by mouth daily. (Patient taking differently: Take 10 mg by mouth at bedtime.), Disp: 90 tablet, Rfl: 3   SYNJARDY  XR 12.07-998 MG TB24, Take 2 tablets by mouth daily. (Patient taking differently: Take 2 tablets by mouth daily. Pt now taking twice a day.), Disp: 180 tablet, Rfl: 3   VASCEPA  1 g capsule, Take 2 capsules (2 g total) by mouth 2 (two) times daily., Disp: 360 capsule, Rfl: 3  Past Medical History: Past Medical History:  Diagnosis Date   CAD (coronary artery disease)    Diabetes mellitus without complication (HCC)    Essential (primary) hypertension    Gallstones    Hepatic cirrhosis (HCC)    Hepatitis B core antibody positive 01/08/2019   Hepatitis C    HIV infection (HCC)    Hyperlipidemia    Hypertension    Internal hemorrhoids    Kidney stones    Pulmonary fibrosis (HCC)    S/P TAVR (transcatheter aortic valve replacement) 06/08/2023   s/p TAVR with a 26mm Edwards S3UR via the TF approach by Dr. Abel Hoe & Dr. Honey Lusty   Severe aortic stenosis    Sleep apnea    severe, on CPAP   Tubular adenoma of colon     Tobacco Use: Social History   Tobacco Use  Smoking Status Former   Current packs/day: 0.00  Average packs/day: 2.0 packs/day for 20.0 years (40.0 ttl pk-yrs)   Types: Cigarettes   Start date: 56   Quit date: 47   Years since quitting: 31.4   Passive exposure: Past  Smokeless Tobacco Never    Labs: Review Flowsheet  More data exists      Latest Ref Rng & Units 05/06/2022 11/03/2022 12/10/2022 04/23/2023 06/08/2023  Labs for ITP Cardiac and Pulmonary Rehab  Cholestrol 0 - 200 mg/dL - 782  - - -  LDL (calc) 0 - 99 mg/dL - 58  - - -  HDL-C >95.62 mg/dL - 13.08  - - -  Trlycerides 0.0 - 149.0 mg/dL - 65.7  - - -  Hemoglobin A1c 4.6 - 6.5 % 6.5  7.0  6.8  - -  PH, Arterial 7.35 - 7.45 - - - 7.391  -  PCO2 arterial 32 -  48 mmHg - - - 36.4  -  Bicarbonate 20.0 - 28.0 mmol/L - - - 25.8  24.3  25.4  22.1  25.6  -  TCO2 22 - 32 mmol/L - - - 27  26  27  23  27  26  24    Acid-base deficit 0.0 - 2.0 mmol/L - - - 1.0  2.0  -  O2 Saturation % - - - 80  82  80  97  81  -    Details       Multiple values from one day are sorted in reverse-chronological order         Capillary Blood Glucose: Lab Results  Component Value Date   GLUCAP 160 (H) 08/13/2023   GLUCAP 116 (H) 07/30/2023   GLUCAP 178 (H) 07/28/2023   GLUCAP 178 (H) 07/28/2023   GLUCAP 127 (H) 07/26/2023    POCT Glucose     Row Name 04/14/23 1142 07/22/23 1310           POCT Blood Glucose   Pre-Exercise 207 mg/dL 846 mg/dL         Exercise Target Goals: Exercise Program Goal: Individual exercise prescription set using results from initial 6 min walk test and THRR while considering  patient's activity barriers and safety.   Exercise Prescription Goal: Initial exercise prescription builds to 30-45 minutes a day of aerobic activity, 2-3 days per week.  Home exercise guidelines will be given to patient during program as part of exercise prescription that the participant will acknowledge.  Activity Barriers & Risk Stratification:  Activity Barriers & Cardiac Risk Stratification - 07/22/23 1330       Activity Barriers & Cardiac Risk Stratification   Activity Barriers Deconditioning;Shortness of Breath;Other (comment)    Comments Supplemental O2 use when exercising    Cardiac Risk Stratification High   Less than 5 METs on         6 Minute Walk:  6 Minute Walk     Row Name 04/14/23 1152 06/03/23 1528 07/22/23 1314     6 Minute Walk   Phase Initial Discharge Initial   Distance 1200 feet 1645 feet 1113 feet   Distance % Change -- 37.08 % --   Distance Feet Change -- 445 ft --   Walk Time 6 minutes 6 minutes 6 minutes   # of Rest Breaks 1  3:03-4:00 0 0   MPH 2.27 3.12 2.1   METS 2.61 3.56 2.72   RPE 11 11 11    Perceived  Dyspnea  1 2 1    VO2 Peak 9.14 12.48 9.51  Symptoms No No Yes (comment)   Comments -- -- SOB   Resting HR 58 bpm 64 bpm 58 bpm   Resting BP 136/70 130/70 132/64   Resting Oxygen  Saturation  92 % 93 % 92 %  RA   Exercise Oxygen  Saturation  during 6 min walk 85 % 86 % 90 %  Started on 2L then increased to 3L   Max Ex. HR 80 bpm 120 bpm 86 bpm   Max Ex. BP 142/68 150/80 160/68   2 Minute Post BP 134/68 142/72 154/60     Interval HR   1 Minute HR 71 120 --   2 Minute HR 77 96 --   3 Minute HR 77 82 --   4 Minute HR 75 97 --   5 Minute HR 79 105 --   6 Minute HR 80 104 --   2 Minute Post HR 59 67 --   Interval Heart Rate? Yes Yes --     Interval Oxygen    Interval Oxygen ? Yes Yes --   Baseline Oxygen  Saturation % 92 % 93 % --   1 Minute Oxygen  Saturation % 93 % 92 % --   1 Minute Liters of Oxygen  0 L 3 L --   2 Minute Oxygen  Saturation % 88 % 90 % --   2 Minute Liters of Oxygen  0 L 3 L --   3 Minute Oxygen  Saturation % 85 % 90 %  2:10- 86% --   3 Minute Liters of Oxygen  0 L 3 L  increased to 6L --   4 Minute Oxygen  Saturation % 92 % 89 % --   4 Minute Liters of Oxygen  2 L 6 L --   5 Minute Oxygen  Saturation % 93 % 88 % --   5 Minute Liters of Oxygen  2 L 6 L --   6 Minute Oxygen  Saturation % 89 % 88 % --   6 Minute Liters of Oxygen  2 L 6 L --   2 Minute Post Oxygen  Saturation % 94 % 93 % --   2 Minute Post Liters of Oxygen  2 L 6 L --      Oxygen  Initial Assessment:  Oxygen  Initial Assessment - 07/22/23 1444       Home Oxygen    Home Oxygen  Device Portable Concentrator    Sleep Oxygen  Prescription CPAP   not working, is receiving a new CPAP in July   Home Exercise Oxygen  Prescription Continuous    Liters per minute 2    Home Resting Oxygen  Prescription None    Compliance with Home Oxygen  Use No   Only uses when SOB, does not use when walking dog.     Initial 6 min Walk   Oxygen  Used Continuous    Liters per minute 3      Program Oxygen  Prescription   Program Oxygen   Prescription Continuous    Liters per minute 3    Comments 90% on 3L      Intervention   Short Term Goals To learn and understand importance of monitoring SPO2 with pulse oximeter and demonstrate accurate use of the pulse oximeter.;To learn and understand importance of maintaining oxygen  saturations>88%;To learn and demonstrate proper pursed lip breathing techniques or other breathing techniques. ;To learn and demonstrate proper use of respiratory medications;To learn and exhibit compliance with exercise, home and travel O2 prescription    Long  Term Goals Maintenance of O2 saturations>88%;Compliance with respiratory medication;Verbalizes importance of monitoring SPO2 with pulse oximeter and return demonstration;Exhibits  proper breathing techniques, such as pursed lip breathing or other method taught during program session;Demonstrates proper use of MDI's;Exhibits compliance with exercise, home  and travel O2 prescription          Oxygen  Re-Evaluation:  Oxygen  Re-Evaluation     Row Name 05/07/23 1336             Goals/Expected Outcomes   Goals/Expected Outcomes Compliance and understanding of oxygen  saturation and breathing techniques to decrease shortness of breath.          Oxygen  Discharge (Final Oxygen  Re-Evaluation):  Oxygen  Re-Evaluation - 05/07/23 1336       Goals/Expected Outcomes   Goals/Expected Outcomes Compliance and understanding of oxygen  saturation and breathing techniques to decrease shortness of breath.          Initial Exercise Prescription:  Initial Exercise Prescription - 07/22/23 1400       Date of Initial Exercise RX and Referring Provider   Date 07/22/23    Referring Provider Fransico Ivy, MD    Expected Discharge Date 10/15/23      Oxygen    Oxygen  Continuous    Liters 2-4    Maintain Oxygen  Saturation 88% or higher      Bike   Level 2    Watts 26    Minutes 15    METs 2      NuStep   Level 2    SPM 65    Minutes 15    METs 2.3       Prescription Details   Frequency (times per week) 3 days/week    Duration Progress to 30 minutes of continuous aerobic without signs/symptoms of physical distress      Intensity   THRR 40-80% of Max Heartrate 59-118    Ratings of Perceived Exertion 11-13    Perceived Dyspnea 0-4      Progression   Progression Continue to progress workloads to maintain intensity without signs/symptoms of physical distress.      Resistance Training   Training Prescription Yes    Weight 3lb wts    Reps 10-15          Perform Capillary Blood Glucose checks as needed.  Exercise Prescription Changes:   Exercise Prescription Changes     Row Name 04/22/23 1202 05/11/23 1100 05/25/23 1100 07/26/23 1600 08/13/23 1400     Response to Exercise   Blood Pressure (Admit) 142/70 130/72 118/60 124/62 130/60   Blood Pressure (Exercise) 142/80 -- 120/70 130/64 142/70   Blood Pressure (Exit) 114/60 108/66 104/60 102/62 112/72   Heart Rate (Admit) 63 bpm 79 bpm 61 bpm 55 bpm 61 bpm   Heart Rate (Exercise) 82 bpm 109 bpm 78 bpm 109 bpm 93 bpm   Heart Rate (Exit) 64 bpm 79 bpm 69 bpm 57 bpm 70 bpm   Oxygen  Saturation (Admit) 94 % 95 % 95 % 96 % 96 %   Oxygen  Saturation (Exercise) 93 % 92 % 88 % 93 % 93 %   Oxygen  Saturation (Exit) 96 % 93 % 93 % 97 % --   Rating of Perceived Exertion (Exercise) 9 11 11 9  12.5   Perceived Dyspnea (Exercise) 1 1 1 1 1    Duration Continue with 30 min of aerobic exercise without signs/symptoms of physical distress. Continue with 30 min of aerobic exercise without signs/symptoms of physical distress. Continue with 30 min of aerobic exercise without signs/symptoms of physical distress. Continue with 30 min of aerobic exercise without signs/symptoms of physical distress. Continue with  30 min of aerobic exercise without signs/symptoms of physical distress.   Intensity THRR unchanged THRR unchanged THRR unchanged THRR unchanged THRR unchanged     Progression   Progression Continue  to progress workloads to maintain intensity without signs/symptoms of physical distress. Continue to progress workloads to maintain intensity without signs/symptoms of physical distress. Continue to progress workloads to maintain intensity without signs/symptoms of physical distress. Continue to progress workloads to maintain intensity without signs/symptoms of physical distress. Continue to progress workloads to maintain intensity without signs/symptoms of physical distress.   Average METs -- -- -- 2.6 3     Resistance Training   Training Prescription Yes Yes Yes Yes Yes   Weight blue bands blue bands blue bands 3lb wts 3 lbs   Reps 10-15 10-15 10-15 10-15 10-15   Time 10 Minutes 10 Minutes 10 Minutes -- 5 Minutes     Oxygen    Oxygen  Continuous Continuous Continuous Continuous Continuous   Liters 2-4 2-4 2-4 2 3      Treadmill   MPH 2 2.1 2 -- --   Grade 0 0 1 -- --   Minutes 15 15 15  -- --   METs 2.4 2.5 2.81 -- --     Bike   Level 4 4 4 3 4    Watts -- -- 30 25 27.9   Minutes 15 15 15 15 15    METs 3.3 2.9 3.1 2.88 2.99     NuStep   Level -- -- -- 2 3   SPM -- -- -- 80 --   Minutes -- -- -- 15 15   METs -- -- -- 2.3 --     Oxygen    Maintain Oxygen  Saturation 88% or higher -- 88% or higher 88% or higher --    Row Name 09/01/23 1400             Response to Exercise   Blood Pressure (Admit) 110/64       Blood Pressure (Exercise) 130/54       Blood Pressure (Exit) 120/64       Heart Rate (Admit) 67 bpm       Heart Rate (Exercise) 91 bpm       Heart Rate (Exit) 61 bpm       Oxygen  Saturation (Admit) 96 %       Oxygen  Saturation (Exercise) 93 %       Rating of Perceived Exertion (Exercise) 11       Perceived Dyspnea (Exercise) 0       Symptoms None       Comments Reviewed METs and goals       Duration Continue with 30 min of aerobic exercise without signs/symptoms of physical distress.       Intensity THRR unchanged         Progression   Progression Continue to  progress workloads to maintain intensity without signs/symptoms of physical distress.       Average METs 2.55         Resistance Training   Training Prescription No         Oxygen    Oxygen  Continuous       Liters 3         Bike   Level 3       Minutes 15       METs 2.68         NuStep   Level 3       SPM 83  Minutes 15       METs 2.4          Exercise Comments:   Exercise Comments     Row Name 04/20/23 1509 07/22/23 1333 07/26/23 1652 08/13/23 1430 09/01/23 1442   Exercise Comments Tyler Hendricks completed his first day of exercise. He exercised for 15 min on the upright bike and treadmill. He averaged 2.6 METs at level 2 on the upright bike and 2.2 METs at 1.7 mph on the treadmill. Tyler Hendricks performed the warmup and cooldown standing without limitations. Plan to discuss METs later on as Tyler Hendricks felt overwhelmed today. Pt completed today. During walk test pt SpO2 dropped to 88% on 2LO2. SpO2 failed to rise after asking pt to slow down and focus on PLB. Spo2 increased to 90 after O2 increased to 3L Pt's first day in the CRP2 program. Pt exercised without complaints and is off to a good start. Reviewed METs and Goals with pt. METs have increased since starting the program. Pt is still progressing towards his goals. Provided strategies that will help increase his stamina and strength. Reviewed METs and Goals with pt. METs have increased since starting the program. Pt is still progressing towards his goals.      Exercise Goals and Review:   Exercise Goals     Row Name 04/14/23 1054 07/22/23 1332           Exercise Goals   Increase Physical Activity Yes Yes      Intervention Provide advice, education, support and counseling about physical activity/exercise needs.;Develop an individualized exercise prescription for aerobic and resistive training based on initial evaluation findings, risk stratification, comorbidities and participant's personal goals. Provide advice, education,  support and counseling about physical activity/exercise needs.;Develop an individualized exercise prescription for aerobic and resistive training based on initial evaluation findings, risk stratification, comorbidities and participant's personal goals.      Expected Outcomes Short Term: Attend rehab on a regular basis to increase amount of physical activity.;Long Term: Add in home exercise to make exercise part of routine and to increase amount of physical activity.;Long Term: Exercising regularly at least 3-5 days a week. Short Term: Attend rehab on a regular basis to increase amount of physical activity.;Long Term: Add in home exercise to make exercise part of routine and to increase amount of physical activity.;Long Term: Exercising regularly at least 3-5 days a week.      Increase Strength and Stamina Yes Yes      Intervention Provide advice, education, support and counseling about physical activity/exercise needs.;Develop an individualized exercise prescription for aerobic and resistive training based on initial evaluation findings, risk stratification, comorbidities and participant's personal goals. Provide advice, education, support and counseling about physical activity/exercise needs.;Develop an individualized exercise prescription for aerobic and resistive training based on initial evaluation findings, risk stratification, comorbidities and participant's personal goals.      Expected Outcomes Short Term: Increase workloads from initial exercise prescription for resistance, speed, and METs.;Short Term: Perform resistance training exercises routinely during rehab and add in resistance training at home;Long Term: Improve cardiorespiratory fitness, muscular endurance and strength as measured by increased METs and functional capacity ( ) Short Term: Increase workloads from initial exercise prescription for resistance, speed, and METs.;Short Term: Perform resistance training exercises routinely during  rehab and add in resistance training at home;Long Term: Improve cardiorespiratory fitness, muscular endurance and strength as measured by increased METs and functional capacity ( )      Able to understand and use rate of perceived exertion (RPE)  scale Yes Yes      Intervention Provide education and explanation on how to use RPE scale Provide education and explanation on how to use RPE scale      Expected Outcomes Short Term: Able to use RPE daily in rehab to express subjective intensity level;Long Term:  Able to use RPE to guide intensity level when exercising independently Short Term: Able to use RPE daily in rehab to express subjective intensity level;Long Term:  Able to use RPE to guide intensity level when exercising independently      Able to understand and use Dyspnea scale Yes Yes      Intervention Provide education and explanation on how to use Dyspnea scale Provide education and explanation on how to use Dyspnea scale      Expected Outcomes Short Term: Able to use Dyspnea scale daily in rehab to express subjective sense of shortness of breath during exertion;Long Term: Able to use Dyspnea scale to guide intensity level when exercising independently Short Term: Able to use Dyspnea scale daily in rehab to express subjective sense of shortness of breath during exertion;Long Term: Able to use Dyspnea scale to guide intensity level when exercising independently      Knowledge and understanding of Target Heart Rate Range (THRR) Yes Yes      Intervention Provide education and explanation of THRR including how the numbers were predicted and where they are located for reference Provide education and explanation of THRR including how the numbers were predicted and where they are located for reference      Expected Outcomes Short Term: Able to state/look up THRR;Long Term: Able to use THRR to govern intensity when exercising independently;Short Term: Able to use daily as guideline for intensity in rehab  Short Term: Able to state/look up THRR;Long Term: Able to use THRR to govern intensity when exercising independently;Short Term: Able to use daily as guideline for intensity in rehab      Able to check pulse independently -- Yes      Intervention -- Provide education and demonstration on how to check pulse in carotid and radial arteries.;Review the importance of being able to check your own pulse for safety during independent exercise      Expected Outcomes -- Short Term: Able to explain why pulse checking is important during independent exercise;Long Term: Able to check pulse independently and accurately      Understanding of Exercise Prescription Yes Yes      Intervention Provide education, explanation, and written materials on patient's individual exercise prescription Provide education, explanation, and written materials on patient's individual exercise prescription      Expected Outcomes Short Term: Able to explain program exercise prescription;Long Term: Able to explain home exercise prescription to exercise independently Short Term: Able to explain program exercise prescription;Long Term: Able to explain home exercise prescription to exercise independently         Exercise Goals Re-Evaluation :  Exercise Goals Re-Evaluation     Row Name 05/07/23 1328 06/03/23 1532 07/26/23 1651 08/13/23 1425 09/01/23 1440     Exercise Goal Re-Evaluation   Exercise Goals Review Increase Physical Activity;Able to understand and use Dyspnea scale;Understanding of Exercise Prescription;Increase Strength and Stamina;Knowledge and understanding of Target Heart Rate Range (THRR);Able to understand and use rate of perceived exertion (RPE) scale Increase Physical Activity;Able to understand and use Dyspnea scale;Understanding of Exercise Prescription;Increase Strength and Stamina;Knowledge and understanding of Target Heart Rate Range (THRR);Able to understand and use rate of perceived exertion (RPE) scale Increase  Physical Activity;Able to understand and use Dyspnea scale;Understanding of Exercise Prescription;Increase Strength and Stamina;Knowledge and understanding of Target Heart Rate Range (THRR);Able to understand and use rate of perceived exertion (RPE) scale Increase Physical Activity;Able to understand and use Dyspnea scale;Understanding of Exercise Prescription;Increase Strength and Stamina;Knowledge and understanding of Target Heart Rate Range (THRR);Able to understand and use rate of perceived exertion (RPE) scale Increase Physical Activity;Able to understand and use Dyspnea scale;Understanding of Exercise Prescription;Increase Strength and Stamina;Knowledge and understanding of Target Heart Rate Range (THRR);Able to understand and use rate of perceived exertion (RPE) scale   Comments Macoy has completed 5 exercise sessions. He exercises for 15 min on the treadmill and upright bike. Tyler Hendricks averages 2.5 METs at 2.1 mph on the treadmill and 2.9 METs at level 4 on the upright bike. Tyler Hendricks performs the warmup and cooldown standing without limitations. Tyler Hendricks has increased his workload for both exercise modes as METs have increased. He tolerates progressions well and seems motivated to exercise. I discussed METs on his first day, although I am unsure if he understands METs yet. Will reiterate METs again. Will continue to monitor and progress as able. Tyler Hendricks has completed 11 exercise sessions. Peak METs were 3.1 on the upright bike and 2.8 on the treadmill. Pt is being discharged for cardiac procedure and will continue in Cardiac Rehab. Pt's first day in the CRP2 program. Pt understands the exercise Rx, RPE scale and THRR. Reviewed METs and Goals with pt. METs have increased since starting the program. Pt is still progressing towards his goals. Provided strategies that will help increase his stamina and strength. Reviewed METs and goals. Pt has been absent from the program due to family issues. Pt reports some progress  on his goal of increased strength and stamina. Pt voices ne sees some impeovement with his breathing, especailly like when walking his dog.   Expected Outcomes Through exercise at rehab and home, the patient will decrease shortness of breath and feel confident in carrying out an exercise regimen at home. Through exercise at rehab and home, the patient will decrease shortness of breath and feel confident in carrying out an exercise regimen at home. Will continue to monitor the patient and progress exercise workloads as tolerated. Will continue to monitor the patient and progress exercise workloads as tolerated. Will continue to monitor the patient and progress exercise workloads as tolerated.      Discharge Exercise Prescription (Final Exercise Prescription Changes):  Exercise Prescription Changes - 09/01/23 1400       Response to Exercise   Blood Pressure (Admit) 110/64    Blood Pressure (Exercise) 130/54    Blood Pressure (Exit) 120/64    Heart Rate (Admit) 67 bpm    Heart Rate (Exercise) 91 bpm    Heart Rate (Exit) 61 bpm    Oxygen  Saturation (Admit) 96 %    Oxygen  Saturation (Exercise) 93 %    Rating of Perceived Exertion (Exercise) 11    Perceived Dyspnea (Exercise) 0    Symptoms None    Comments Reviewed METs and goals    Duration Continue with 30 min of aerobic exercise without signs/symptoms of physical distress.    Intensity THRR unchanged      Progression   Progression Continue to progress workloads to maintain intensity without signs/symptoms of physical distress.    Average METs 2.55      Resistance Training   Training Prescription No      Oxygen    Oxygen  Continuous    Liters 3  Bike   Level 3    Minutes 15    METs 2.68      NuStep   Level 3    SPM 83    Minutes 15    METs 2.4          Nutrition:  Target Goals: Understanding of nutrition guidelines, daily intake of sodium 1500mg , cholesterol 200mg , calories 30% from fat and 7% or less from  saturated fats, daily to have 5 or more servings of fruits and vegetables.  Biometrics:  Pre Biometrics - 07/22/23 1102       Pre Biometrics   Waist Circumference 41.5 inches    Hip Circumference 40.5 inches    Waist to Hip Ratio 1.02 %    Triceps Skinfold 7 mm    % Body Fat 24.3 %    Grip Strength 29 kg    Flexibility 0 in   could not reach   Single Leg Stand 13 seconds           Nutrition Therapy Plan and Nutrition Goals:  Nutrition Therapy & Goals - 08/31/23 1146       Nutrition Therapy   Diet Heart Healthy Diet    Drug/Food Interactions Statins/Certain Fruits      Personal Nutrition Goals   Nutrition Goal Patient to identify strategies for reducing cardiovascular risk by attending the Pritikin education and nutrition series weekly.   goal not met.   Personal Goal #2 Patient to improve diet quality by using the plate method as a guide for meal planning to include lean protein/plant protein, fruits, vegetables, whole grains, nonfat dairy as part of a well-balanced diet.   goal not met.   Personal Goal #3 Patient to maintain weight/ identify strategies for weight gain as needed of 0.5-2.0# per week.   goals not met.   Comments Goals not met. Attendance remains variable. Tyler Hendricks has medical history of pulmonary fibrosis, s/p TAVR, sleep apnea, HIV, chronic respiratory failure, cirrhosis, DM2, hyperlipidemia, CAD, HTN. Patient reports concerns of weight loss since starting ofev ; he is down 12.3# over the last year (6/72024 office visit, 91.6kg); he is down and additional 2.2# since starting intensive cardiac rehab. He continues Ofev  at this time. He is currently drinking 1 Boost per day (240kcals, 10g protein). A1c is at goal <7%. LDL/HDL are at goal. Per documentation, patient has been inconsistent with CPAP and O2 use.   Patient will benefit from participation in intensive cardiac rehab for nutrition, exercise, and lifestyle modification.      Intervention Plan   Intervention  Prescribe, educate and counsel regarding individualized specific dietary modifications aiming towards targeted core components such as weight, hypertension, lipid management, diabetes, heart failure and other comorbidities.;Nutrition handout(s) given to patient.    Expected Outcomes Short Term Goal: Understand basic principles of dietary content, such as calories, fat, sodium, cholesterol and nutrients.;Long Term Goal: Adherence to prescribed nutrition plan.          Nutrition Assessments:  Nutrition Assessments - 07/29/23 1515       Rate Your Plate Scores   Pre Score 47         MEDIFICTS Score Key: >=70 Need to make dietary changes  40-70 Heart Healthy Diet <= 40 Therapeutic Level Cholesterol Diet   Flowsheet Row INTENSIVE CARDIAC REHAB from 07/26/2023 in [facility]  Picture Your Plate Total Score on Admission 47   Picture Your Plate Scores: <09 Unhealthy dietary pattern with much room  for improvement. 41-50 Dietary pattern unlikely to meet recommendations for good health and room for improvement. 51-60 More healthful dietary pattern, with some room for improvement.  >60 Healthy dietary pattern, although there may be some specific behaviors that could be improved.    Nutrition Goals Re-Evaluation:  Nutrition Goals Re-Evaluation     Row Name 07/29/23 1033 08/31/23 1146           Goals   Current Weight 189 lb 9.5 oz (86 kg) 194 lb 3.6 oz (88.1 kg)  weight from last attended session on 08/13/23      Comment A1c 6.8, LDL 58, HDL 70 A1c 6.8, LDL 58, HDL 70      Expected Outcome Tyler Hendricks has medical history of pulmonary fibrosis, s/p TAVR, sleep apnea, HIV, chronic respiratory failure, cirrhosis, DM2, hyperlipidemia, CAD, HTN. Patient reports concerns of weight loss since starting ofev ; he is down 12.3# over the last year (6/72024 office visit, 91.6kg). He continues Ofev  at this time. He is currently drinking 1 Boost per day  (240kcals, 10g protein). A1c is at goal <7%. LDL/HDL are at goal. Per documentation, patient has been inconsistent with CPAP and O2 use. Patient will benefit from participation in intensive cardiac rehab for nutrition, exercise, and lifestyle modification. Goals not met. Attendance remains variable. Tyler Hendricks has medical history of pulmonary fibrosis, s/p TAVR, sleep apnea, HIV, chronic respiratory failure, cirrhosis, DM2, hyperlipidemia, CAD, HTN. Patient reports concerns of weight loss since starting ofev ; he is down 12.3# over the last year (6/72024 office visit, 91.6kg); he is down and additional 2.2# since starting intensive cardiac rehab. He continues Ofev  at this time. He is currently drinking 1 Boost per day (240kcals, 10g protein). Will continue to discuss strategies for weight gain including calorie density, nutrition supplements, eating frequency, etc. A1c is at goal <7%. LDL/HDL are at goal. Per documentation, patient has been inconsistent with CPAP and O2 use. Patient will benefit from participation in intensive cardiac rehab for nutrition, exercise, and lifestyle modification.         Nutrition Goals Re-Evaluation:  Nutrition Goals Re-Evaluation     Row Name 07/29/23 1033 08/31/23 1146           Goals   Current Weight 189 lb 9.5 oz (86 kg) 194 lb 3.6 oz (88.1 kg)  weight from last attended session on 08/13/23      Comment A1c 6.8, LDL 58, HDL 70 A1c 6.8, LDL 58, HDL 70      Expected Outcome Tyler Hendricks has medical history of pulmonary fibrosis, s/p TAVR, sleep apnea, HIV, chronic respiratory failure, cirrhosis, DM2, hyperlipidemia, CAD, HTN. Patient reports concerns of weight loss since starting ofev ; he is down 12.3# over the last year (6/72024 office visit, 91.6kg). He continues Ofev  at this time. He is currently drinking 1 Boost per day (240kcals, 10g protein). A1c is at goal <7%. LDL/HDL are at goal. Per documentation, patient has been inconsistent with CPAP and O2 use. Patient will benefit from  participation in intensive cardiac rehab for nutrition, exercise, and lifestyle modification. Goals not met. Attendance remains variable. Tyler Hendricks has medical history of pulmonary fibrosis, s/p TAVR, sleep apnea, HIV, chronic respiratory failure, cirrhosis, DM2, hyperlipidemia, CAD, HTN. Patient reports concerns of weight loss since starting ofev ; he is down 12.3# over the last year (6/72024 office visit, 91.6kg); he is down and additional 2.2# since starting intensive cardiac rehab. He continues Ofev  at this time. He is currently drinking 1 Boost per day (240kcals, 10g protein). Will continue to discuss  strategies for weight gain including calorie density, nutrition supplements, eating frequency, etc. A1c is at goal <7%. LDL/HDL are at goal. Per documentation, patient has been inconsistent with CPAP and O2 use. Patient will benefit from participation in intensive cardiac rehab for nutrition, exercise, and lifestyle modification.         Nutrition Goals Discharge (Final Nutrition Goals Re-Evaluation):  Nutrition Goals Re-Evaluation - 08/31/23 1146       Goals   Current Weight 194 lb 3.6 oz (88.1 kg)   weight from last attended session on 08/13/23   Comment A1c 6.8, LDL 58, HDL 70    Expected Outcome Goals not met. Attendance remains variable. Tyler Hendricks has medical history of pulmonary fibrosis, s/p TAVR, sleep apnea, HIV, chronic respiratory failure, cirrhosis, DM2, hyperlipidemia, CAD, HTN. Patient reports concerns of weight loss since starting ofev ; he is down 12.3# over the last year (6/72024 office visit, 91.6kg); he is down and additional 2.2# since starting intensive cardiac rehab. He continues Ofev  at this time. He is currently drinking 1 Boost per day (240kcals, 10g protein). Will continue to discuss strategies for weight gain including calorie density, nutrition supplements, eating frequency, etc. A1c is at goal <7%. LDL/HDL are at goal. Per documentation, patient has been inconsistent with CPAP and O2 use.  Patient will benefit from participation in intensive cardiac rehab for nutrition, exercise, and lifestyle modification.          Psychosocial: Target Goals: Acknowledge presence or absence of significant depression and/or stress, maximize coping skills, provide positive support system. Participant is able to verbalize types and ability to use techniques and skills needed for reducing stress and depression.  Initial Review & Psychosocial Screening:  Initial Psych Review & Screening - 07/22/23 1441       Initial Review   Current issues with Current Sleep Concerns   pt cannot sleep well d/t broken CPAP.     Family Dynamics   Good Support System? Yes      Barriers   Psychosocial barriers to participate in program There are no identifiable barriers or psychosocial needs.      Screening Interventions   Interventions Encouraged to exercise;To provide support and resources with identified psychosocial needs    Expected Outcomes Long Term goal: The participant improves quality of Life and PHQ9 Scores as seen by post scores and/or verbalization of changes;Short Term goal: Identification and review with participant of any Quality of Life or Depression concerns found by scoring the questionnaire.;Long Term Goal: Stressors or current issues are controlled or eliminated.;Short Term goal: Utilizing psychosocial counselor, staff and physician to assist with identification of specific Stressors or current issues interfering with healing process. Setting desired goal for each stressor or current issue identified.          Quality of Life Scores:  Quality of Life - 07/22/23 1343       Quality of Life   Select Quality of Life      Quality of Life Scores   Health/Function Pre 18.43 %    Socioeconomic Pre 17.4 %    Psych/Spiritual Pre 24.21 %    Family Pre 26.25 %    GLOBAL Pre 20.58 %         Scores of 19 and below usually indicate a poorer quality of life in these areas.  A difference of   2-3 points is a clinically meaningful difference.  A difference of 2-3 points in the total score of the Quality of Life Index has been associated with  significant improvement in overall quality of life, self-image, physical symptoms, and general health in studies assessing change in quality of life.  PHQ-9: Review Flowsheet  More data exists      07/22/2023 07/16/2023 04/14/2023 01/01/2023 07/10/2022  Depression screen PHQ 2/9  Decreased Interest 0 0 0 0 0  Down, Depressed, Hopeless 0 0 0 0 0  PHQ - 2 Score 0 0 0 0 0  Altered sleeping 1 0 0 - -  Tired, decreased energy 0 0 0 - -  Change in appetite 0 0 0 - -  Feeling bad or failure about yourself  0 0 0 - -  Trouble concentrating 0 0 0 - -  Moving slowly or fidgety/restless 0 0 0 - -  Suicidal thoughts 0 0 0 - -  PHQ-9 Score 1 0 0 - -  Difficult doing work/chores Not difficult at all Somewhat difficult Not difficult at all - -   Interpretation of Total Score  Total Score Depression Severity:  1-4 = Minimal depression, 5-9 = Mild depression, 10-14 = Moderate depression, 15-19 = Moderately severe depression, 20-27 = Severe depression   Psychosocial Evaluation and Intervention:  Psychosocial Evaluation - 04/14/23 1143       Psychosocial Evaluation & Interventions   Interventions Encouraged to exercise with the program and follow exercise prescription    Comments Tyler Hendricks denies any psychosocial barriers or concerns at this time.    Expected Outcomes For Tyler Hendricks to participate in PR free of any psychosocial barriers or concerns    Continue Psychosocial Services  No Follow up required          Psychosocial Re-Evaluation:  Psychosocial Re-Evaluation     Row Name 05/05/23 1131 06/07/23 0851 07/27/23 1111 08/10/23 1754 09/07/23 1706     Psychosocial Re-Evaluation   Current issues with None Identified None Identified Current Sleep Concerns Current Sleep Concerns Current Sleep Concerns   Comments Tyler Hendricks has attended 4 sessions so far. He  denies any new psychosocial barriers or concerns at this time. Pt was discharged from the program on 06/03/23 due to having surgery on 06/08/23. No needs at the time of discharge. Tyler Hendricks did not voice any increased concerns or stressors on his fiest day of exercise. Will discuss PHQ9 and quality of life in the upcoming week Tyler Hendricks has not voiced any increased concerns or stressors during exercise at cardiac rehab. Will discuss PHQ9 and quality of life in the upcoming week PHQ9 and quality of life reviewed.Tyler Hendricks denies being depressed currently. Tyler Hendricks says that he has difficulty sleeping due to his sleep apnea and waiting for his CPAP machine to be serviced. Tyler Hendricks says he has had some improvement in his shortness of breath since he has been participating in cardiac rehab.   Expected Outcomes For Tyler Hendricks to participate in PR free of any psychosocial barriers or concnerns. -- -- -- --   Interventions Encouraged to attend Pulmonary Rehabilitation for the exercise -- Stress management education;Relaxation education;Encouraged to attend Cardiac Rehabilitation for the exercise Stress management education;Relaxation education;Encouraged to attend Cardiac Rehabilitation for the exercise Stress management education;Relaxation education;Encouraged to attend Cardiac Rehabilitation for the exercise   Continue Psychosocial Services  No Follow up required -- Follow up required by staff Follow up required by staff No Follow up required      Psychosocial Discharge (Final Psychosocial Re-Evaluation):  Psychosocial Re-Evaluation - 09/07/23 1706       Psychosocial Re-Evaluation   Current issues with Current Sleep Concerns    Comments PHQ9 and quality  of life reviewed.Tyler Hendricks denies being depressed currently. Tyler Hendricks says that he has difficulty sleeping due to his sleep apnea and waiting for his CPAP machine to be serviced. Tyler Hendricks says he has had some improvement in his shortness of breath since he has been participating in cardiac rehab.     Interventions Stress management education;Relaxation education;Encouraged to attend Cardiac Rehabilitation for the exercise    Continue Psychosocial Services  No Follow up required          Vocational Rehabilitation: Provide vocational rehab assistance to qualifying candidates.   Vocational Rehab Evaluation & Intervention:  Vocational Rehab - 07/22/23 1352       Initial Vocational Rehab Evaluation & Intervention   Assessment shows need for Vocational Rehabilitation No   Retired         Education: Education Goals: Education classes will be provided on a weekly basis, covering required topics. Participant will state understanding/return demonstration of topics presented.    Education     Row Name 04/22/23 1100     Education   Pulmonary Education Topics Stress and Energy Conservation     Stress and Energy Conservation   Date 04/22/23   Educator RN   Instruction Review Code 1- Verbalizes Understanding    Row Name 04/29/23 1000     Education   Pulmonary Education Topics Warning Signs and Symptoms     Warning Signs and Symptoms   Date 04/29/23   Educator RN   Instruction Review Code 1- Verbalizes Understanding    Row Name 05/06/23 1000     Education   Pulmonary Education Topics Risk Factor Reduction     Risk Factor Reduction   Date 05/06/23   Educator EP   Instruction Review Code 1- Verbalizes Understanding    Row Name 05/20/23 1000     Education   Pulmonary Education Topics Pulmonary Diseases     Pulmonary Diseases   Date 05/20/23   Educator RT   Instruction Review Code 1- Verbalizes Understanding    Row Name 06/03/23 1100     Education   Pulmonary Education Topics Other Education     Other Education   Date 06/03/23   Educator RT   Instruction Review Code 1- Verbalizes Understanding      Core Videos: Exercise    Move It!  Clinical staff conducted group or individual video education with verbal and written material and guidebook.  Patient  learns the recommended Pritikin exercise program. Exercise with the goal of living a long, healthy life. Some of the health benefits of exercise include controlled diabetes, healthier blood pressure levels, improved cholesterol levels, improved heart and lung capacity, improved sleep, and better body composition. Everyone should speak with their doctor before starting or changing an exercise routine.  Biomechanical Limitations Clinical staff conducted group or individual video education with verbal and written material and guidebook.  Patient learns how biomechanical limitations can impact exercise and how we can mitigate and possibly overcome limitations to have an impactful and balanced exercise routine.  Body Composition Clinical staff conducted group or individual video education with verbal and written material and guidebook.  Patient learns that body composition (ratio of muscle mass to fat mass) is a key component to assessing overall fitness, rather than body weight alone. Increased fat mass, especially visceral belly fat, can put us  at increased risk for metabolic syndrome, type 2 diabetes, heart disease, and even death. It is recommended to combine diet and exercise (cardiovascular and resistance training) to improve your body composition. Seek  guidance from your physician and exercise physiologist before implementing an exercise routine.  Exercise Action Plan Clinical staff conducted group or individual video education with verbal and written material and guidebook.  Patient learns the recommended strategies to achieve and enjoy long-term exercise adherence, including variety, self-motivation, self-efficacy, and positive decision making. Benefits of exercise include fitness, good health, weight management, more energy, better sleep, less stress, and overall well-being.  Medical   Heart Disease Risk Reduction Clinical staff conducted group or individual video education with verbal and  written material and guidebook.  Patient learns our heart is our most vital organ as it circulates oxygen , nutrients, white blood cells, and hormones throughout the entire body, and carries waste away. Data supports a plant-based eating plan like the Pritikin Program for its effectiveness in slowing progression of and reversing heart disease. The video provides a number of recommendations to address heart disease.   Metabolic Syndrome and Belly Fat  Clinical staff conducted group or individual video education with verbal and written material and guidebook.  Patient learns what metabolic syndrome is, how it leads to heart disease, and how one can reverse it and keep it from coming back. You have metabolic syndrome if you have 3 of the following 5 criteria: abdominal obesity, high blood pressure, high triglycerides, low HDL cholesterol, and high blood sugar.  Hypertension and Heart Disease Clinical staff conducted group or individual video education with verbal and written material and guidebook.  Patient learns that high blood pressure, or hypertension, is very common in the [place] . Hypertension is largely due to excessive salt intake, but other important risk factors include being overweight, physical inactivity, drinking too much alcohol, smoking, and not eating enough potassium from fruits and vegetables. High blood pressure is a leading risk factor for heart attack, stroke, congestive heart failure, dementia, kidney failure, and premature death. Long-term effects of excessive salt intake include stiffening of the arteries and thickening of heart muscle and organ damage. Recommendations include ways to reduce hypertension and the risk of heart disease.  Diseases of Our Time - Focusing on Diabetes Clinical staff conducted group or individual video education with verbal and written material and guidebook.  Patient learns why the best way to stop diseases of our time is prevention, through food  and other lifestyle changes. Medicine (such as prescription pills and surgeries) is often only a Band-Aid on the problem, not a long-term solution. Most common diseases of our time include obesity, type 2 diabetes, hypertension, heart disease, and cancer. The Pritikin Program is recommended and has been proven to help reduce, reverse, and/or prevent the damaging effects of metabolic syndrome.  Nutrition   Overview of the Pritikin Eating Plan  Clinical staff conducted group or individual video education with verbal and written material and guidebook.  Patient learns about the Pritikin Eating Plan for disease risk reduction. The Pritikin Eating Plan emphasizes a wide variety of unrefined, minimally-processed carbohydrates, like fruits, vegetables, whole grains, and legumes. Go, Caution, and Stop food choices are explained. Plant-based and lean animal proteins are emphasized. Rationale provided for low sodium intake for blood pressure control, low added sugars for blood sugar stabilization, and low added fats and oils for coronary artery disease risk reduction and weight management.  Calorie Density  Clinical staff conducted group or individual video education with verbal and written material and guidebook.  Patient learns about calorie density and how it impacts the Pritikin Eating Plan. Knowing the characteristics of the food you choose will help you decide  whether those foods will lead to weight gain or weight loss, and whether you want to consume more or less of them. Weight loss is usually a side effect of the Pritikin Eating Plan because of its focus on low calorie-dense foods.  Label Reading  Clinical staff conducted group or individual video education with verbal and written material and guidebook.  Patient learns about the Pritikin recommended label reading guidelines and corresponding recommendations regarding calorie density, added sugars, sodium content, and whole grains.  Dining Out - Part 1   Clinical staff conducted group or individual video education with verbal and written material and guidebook.  Patient learns that restaurant meals can be sabotaging because they can be so high in calories, fat, sodium, and/or sugar. Patient learns recommended strategies on how to positively address this and avoid unhealthy pitfalls.  Facts on Fats  Clinical staff conducted group or individual video education with verbal and written material and guidebook.  Patient learns that lifestyle modifications can be just as effective, if not more so, as many medications for lowering your risk of heart disease. A Pritikin lifestyle can help to reduce your risk of inflammation and atherosclerosis (cholesterol build-up, or plaque, in the artery walls). Lifestyle interventions such as dietary choices and physical activity address the cause of atherosclerosis. A review of the types of fats and their impact on blood cholesterol levels, along with dietary recommendations to reduce fat intake is also included.  Nutrition Action Plan  Clinical staff conducted group or individual video education with verbal and written material and guidebook.  Patient learns how to incorporate Pritikin recommendations into their lifestyle. Recommendations include planning and keeping personal health goals in mind as an important part of their success.  Healthy Mind-Set    Healthy Minds, Bodies, Hearts  Clinical staff conducted group or individual video education with verbal and written material and guidebook.  Patient learns how to identify when they are stressed. Video will discuss the impact of that stress, as well as the many benefits of stress management. Patient will also be introduced to stress management techniques. The way we think, act, and feel has an impact on our hearts.  How Our Thoughts Can Heal Our Hearts  Clinical staff conducted group or individual video education with verbal and written material and guidebook.   Patient learns that negative thoughts can cause depression and anxiety. This can result in negative lifestyle behavior and serious health problems. Cognitive behavioral therapy is an effective method to help control our thoughts in order to change and improve our emotional outlook.  Additional Videos:  Exercise    Improving Performance  Clinical staff conducted group or individual video education with verbal and written material and guidebook.  Patient learns to use a non-linear approach by alternating intensity levels and lengths of time spent exercising to help burn more calories and lose more body fat. Cardiovascular exercise helps improve heart health, metabolism, hormonal balance, blood sugar control, and recovery from fatigue. Resistance training improves strength, endurance, balance, coordination, reaction time, metabolism, and muscle mass. Flexibility exercise improves circulation, posture, and balance. Seek guidance from your physician and exercise physiologist before implementing an exercise routine and learn your capabilities and proper form for all exercise.  Introduction to Yoga  Clinical staff conducted group or individual video education with verbal and written material and guidebook.  Patient learns about yoga, a discipline of the coming together of mind, breath, and body. The benefits of yoga include improved flexibility, improved range of motion, better posture  and core strength, increased lung function, weight loss, and positive self-image. Yoga's heart health benefits include lowered blood pressure, healthier heart rate, decreased cholesterol and triglyceride levels, improved immune function, and reduced stress. Seek guidance from your physician and exercise physiologist before implementing an exercise routine and learn your capabilities and proper form for all exercise.  Medical   Aging: Enhancing Your Quality of Life  Clinical staff conducted group or individual video education  with verbal and written material and guidebook.  Patient learns key strategies and recommendations to stay in good physical health and enhance quality of life, such as prevention strategies, having an advocate, securing a Health Care Proxy and Power of Attorney, and keeping a list of medications and system for tracking them. It also discusses how to avoid risk for bone loss.  Biology of Weight Control  Clinical staff conducted group or individual video education with verbal and written material and guidebook.  Patient learns that weight gain occurs because we consume more calories than we burn (eating more, moving less). Even if your body weight is normal, you may have higher ratios of fat compared to muscle mass. Too much body fat puts you at increased risk for cardiovascular disease, heart attack, stroke, type 2 diabetes, and obesity-related cancers. In addition to exercise, following the Pritikin Eating Plan can help reduce your risk.  Decoding Lab Results  Clinical staff conducted group or individual video education with verbal and written material and guidebook.  Patient learns that lab test reflects one measurement whose values change over time and are influenced by many factors, including medication, stress, sleep, exercise, food, hydration, pre-existing medical conditions, and more. It is recommended to use the knowledge from this video to become more involved with your lab results and evaluate your numbers to speak with your doctor.   Diseases of Our Time - Overview  Clinical staff conducted group or individual video education with verbal and written material and guidebook.  Patient learns that according to the CDC, 50% to 70% of chronic diseases (such as obesity, type 2 diabetes, elevated lipids, hypertension, and heart disease) are avoidable through lifestyle improvements including healthier food choices, listening to satiety cues, and increased physical activity.  Sleep  Disorders Clinical staff conducted group or individual video education with verbal and written material and guidebook.  Patient learns how good quality and duration of sleep are important to overall health and well-being. Patient also learns about sleep disorders and how they impact health along with recommendations to address them, including discussing with a physician.  Nutrition  Dining Out - Part 2 Clinical staff conducted group or individual video education with verbal and written material and guidebook.  Patient learns how to plan ahead and communicate in order to maximize their dining experience in a healthy and nutritious manner. Included are recommended food choices based on the type of restaurant the patient is visiting.   Fueling a Banker conducted group or individual video education with verbal and written material and guidebook.  There is a strong connection between our food choices and our health. Diseases like obesity and type 2 diabetes are very prevalent and are in large-part due to lifestyle choices. The Pritikin Eating Plan provides plenty of food and hunger-curbing satisfaction. It is easy to follow, affordable, and helps reduce health risks.  Menu Workshop  Clinical staff conducted group or individual video education with verbal and written material and guidebook.  Patient learns that restaurant meals can sabotage health goals because  they are often packed with calories, fat, sodium, and sugar. Recommendations include strategies to plan ahead and to communicate with the manager, chef, or server to help order a healthier meal.  Planning Your Eating Strategy  Clinical staff conducted group or individual video education with verbal and written material and guidebook.  Patient learns about the Pritikin Eating Plan and its benefit of reducing the risk of disease. The Pritikin Eating Plan does not focus on calories. Instead, it emphasizes high-quality,  nutrient-rich foods. By knowing the characteristics of the foods, we choose, we can determine their calorie density and make informed decisions.  Targeting Your Nutrition Priorities  Clinical staff conducted group or individual video education with verbal and written material and guidebook.  Patient learns that lifestyle habits have a tremendous impact on disease risk and progression. This video provides eating and physical activity recommendations based on your personal health goals, such as reducing LDL cholesterol, losing weight, preventing or controlling type 2 diabetes, and reducing high blood pressure.  Vitamins and Minerals  Clinical staff conducted group or individual video education with verbal and written material and guidebook.  Patient learns different ways to obtain key vitamins and minerals, including through a recommended healthy diet. It is important to discuss all supplements you take with your doctor.   Healthy Mind-Set    Smoking Cessation  Clinical staff conducted group or individual video education with verbal and written material and guidebook.  Patient learns that cigarette smoking and tobacco addiction pose a serious health risk which affects millions of people. Stopping smoking will significantly reduce the risk of heart disease, lung disease, and many forms of cancer. Recommended strategies for quitting are covered, including working with your doctor to develop a successful plan.  Culinary   Becoming a Set designer conducted group or individual video education with verbal and written material and guidebook.  Patient learns that cooking at home can be healthy, cost-effective, quick, and puts them in control. Keys to cooking healthy recipes will include looking at your recipe, assessing your equipment needs, planning ahead, making it simple, choosing cost-effective seasonal ingredients, and limiting the use of added fats, salts, and sugars.  Cooking -  Breakfast and Snacks  Clinical staff conducted group or individual video education with verbal and written material and guidebook.  Patient learns how important breakfast is to satiety and nutrition through the entire day. Recommendations include key foods to eat during breakfast to help stabilize blood sugar levels and to prevent overeating at meals later in the day. Planning ahead is also a key component.  Cooking - Educational psychologist conducted group or individual video education with verbal and written material and guidebook.  Patient learns eating strategies to improve overall health, including an approach to cook more at home. Recommendations include thinking of animal protein as a side on your plate rather than center stage and focusing instead on lower calorie dense options like vegetables, fruits, whole grains, and plant-based proteins, such as beans. Making sauces in large quantities to freeze for later and leaving the skin on your vegetables are also recommended to maximize your experience.  Cooking - Healthy Salads and Dressing Clinical staff conducted group or individual video education with verbal and written material and guidebook.  Patient learns that vegetables, fruits, whole grains, and legumes are the foundations of the Pritikin Eating Plan. Recommendations include how to incorporate each of these in flavorful and healthy salads, and how to create homemade salad dressings. Proper  handling of ingredients is also covered. Cooking - Soups and [employer] - Soups and Desserts Clinical staff conducted group or individual video education with verbal and written material and guidebook.  Patient learns that Pritikin soups and desserts make for easy, nutritious, and delicious snacks and meal components that are low in sodium, fat, sugar, and calorie density, while high in vitamins, minerals, and filling fiber. Recommendations include simple and healthy ideas for soups and  desserts.   Overview     The Pritikin Solution Program Overview Clinical staff conducted group or individual video education with verbal and written material and guidebook.  Patient learns that the results of the Pritikin Program have been documented in more than 100 articles published in peer-reviewed journals, and the benefits include reducing risk factors for (and, in some cases, even reversing) high cholesterol, high blood pressure, type 2 diabetes, obesity, and more! An overview of the three key pillars of the Pritikin Program will be covered: eating well, doing regular exercise, and having a healthy mind-set.  WORKSHOPS  Exercise: Exercise Basics: Building Your Action Plan Clinical staff led group instruction and group discussion with PowerPoint presentation and patient guidebook. To enhance the learning environment the use of posters, models and videos may be added. At the conclusion of this workshop, patients will comprehend the difference between physical activity and exercise, as well as the benefits of incorporating both, into their routine. Patients will understand the FITT (Frequency, Intensity, Time, and Type) principle and how to use it to build an exercise action plan. In addition, safety concerns and other considerations for exercise and cardiac rehab will be addressed by the presenter. The purpose of this lesson is to promote a comprehensive and effective weekly exercise routine in order to improve patients' overall level of fitness.   Managing Heart Disease: Your Path to a Healthier Heart Clinical staff led group instruction and group discussion with PowerPoint presentation and patient guidebook. To enhance the learning environment the use of posters, models and videos may be added.At the conclusion of this workshop, patients will understand the anatomy and physiology of the heart. Additionally, they will understand how Pritikin's three pillars impact the risk factors, the  progression, and the management of heart disease.  The purpose of this lesson is to provide a high-level overview of the heart, heart disease, and how the Pritikin lifestyle positively impacts risk factors.  Exercise Biomechanics Clinical staff led group instruction and group discussion with PowerPoint presentation and patient guidebook. To enhance the learning environment the use of posters, models and videos may be added. Patients will learn how the structural parts of their bodies function and how these functions impact their daily activities, movement, and exercise. Patients will learn how to promote a neutral spine, learn how to manage pain, and identify ways to improve their physical movement in order to promote healthy living. The purpose of this lesson is to expose patients to common physical limitations that impact physical activity. Participants will learn practical ways to adapt and manage aches and pains, and to minimize their effect on regular exercise. Patients will learn how to maintain good posture while sitting, walking, and lifting.  Balance Training and Fall Prevention  Clinical staff led group instruction and group discussion with PowerPoint presentation and patient guidebook. To enhance the learning environment the use of posters, models and videos may be added. At the conclusion of this workshop, patients will understand the importance of their sensorimotor skills (vision, proprioception, and the vestibular system) in maintaining  their ability to balance as they age. Patients will apply a variety of balancing exercises that are appropriate for their current level of function. Patients will understand the common causes for poor balance, possible solutions to these problems, and ways to modify their physical environment in order to minimize their fall risk. The purpose of this lesson is to teach patients about the importance of maintaining balance as they age and ways to  minimize their risk of falling.  WORKSHOPS   Nutrition:  Fueling a Ship broker led group instruction and group discussion with PowerPoint presentation and patient guidebook. To enhance the learning environment the use of posters, models and videos may be added. Patients will review the foundational principles of the Pritikin Eating Plan and understand what constitutes a serving size in each of the food groups. Patients will also learn Pritikin-friendly foods that are better choices when away from home and review make-ahead meal and snack options. Calorie density will be reviewed and applied to three nutrition priorities: weight maintenance, weight loss, and weight gain. The purpose of this lesson is to reinforce (in a group setting) the key concepts around what patients are recommended to eat and how to apply these guidelines when away from home by planning and selecting Pritikin-friendly options. Patients will understand how calorie density may be adjusted for different weight management goals.  Mindful Eating  Clinical staff led group instruction and group discussion with PowerPoint presentation and patient guidebook. To enhance the learning environment the use of posters, models and videos may be added. Patients will briefly review the concepts of the Pritikin Eating Plan and the importance of low-calorie dense foods. The concept of mindful eating will be introduced as well as the importance of paying attention to internal hunger signals. Triggers for non-hunger eating and techniques for dealing with triggers will be explored. The purpose of this lesson is to provide patients with the opportunity to review the basic principles of the Pritikin Eating Plan, discuss the value of eating mindfully and how to measure internal cues of hunger and fullness using the Hunger Scale. Patients will also discuss reasons for non-hunger eating and learn strategies to use for controlling emotional  eating.  Targeting Your Nutrition Priorities Clinical staff led group instruction and group discussion with PowerPoint presentation and patient guidebook. To enhance the learning environment the use of posters, models and videos may be added. Patients will learn how to determine their genetic susceptibility to disease by reviewing their family history. Patients will gain insight into the importance of diet as part of an overall healthy lifestyle in mitigating the impact of genetics and other environmental insults. The purpose of this lesson is to provide patients with the opportunity to assess their personal nutrition priorities by looking at their family history, their own health history and current risk factors. Patients will also be able to discuss ways of prioritizing and modifying the Pritikin Eating Plan for their highest risk areas  Menu  Clinical staff led group instruction and group discussion with PowerPoint presentation and patient guidebook. To enhance the learning environment the use of posters, models and videos may be added. Using menus brought in from [employer], or printed from [employer], patients will apply the Pritikin dining out guidelines that were presented in the [employer] video. Patients will also be able to practice these guidelines in a variety of provided scenarios. The purpose of this lesson is to provide patients with the opportunity to practice hands-on learning of  the [employer] guidelines with actual menus and practice scenarios.  Label Reading Clinical staff led group instruction and group discussion with PowerPoint presentation and patient guidebook. To enhance the learning environment the use of posters, models and videos may be added. Patients will review and discuss the Pritikin label reading guidelines presented in Pritikin's Label Reading Educational series video. Using fool labels brought in from local grocery stores and  markets, patients will apply the label reading guidelines and determine if the packaged food meet the Pritikin guidelines. The purpose of this lesson is to provide patients with the opportunity to review, discuss, and practice hands-on learning of the Pritikin Label Reading guidelines with actual packaged food labels. Cooking School  Pritikin's [employer] are designed to teach patients ways to prepare quick, simple, and affordable recipes at home. The importance of nutrition's role in chronic disease risk reduction is reflected in its emphasis in the overall Pritikin program. By learning how to prepare essential core Pritikin Eating Plan recipes, patients will increase control over what they eat; be able to customize the flavor of foods without the use of added salt, sugar, or fat; and improve the quality of the food they consume. By learning a set of core recipes which are easily assembled, quickly prepared, and affordable, patients are more likely to prepare more healthy foods at home. These workshops focus on convenient breakfasts, simple entres, side dishes, and desserts which can be prepared with minimal effort and are consistent with nutrition recommendations for cardiovascular risk reduction. Cooking [employer] are taught by a Armed forces logistics/support/administrative officer (RD) who has been trained by the [employer]. The chef or RD has a clear understanding of the importance of minimizing - if not completely eliminating - added fat, sugar, and sodium in recipes. Throughout the series of Cooking School Workshop sessions, patients will learn about healthy ingredients and efficient methods of cooking to build confidence in their capability to prepare    Cooking School weekly topics:  Adding Flavor- Sodium-Free  Fast and Healthy Breakfasts  Powerhouse Plant-Based Proteins  Satisfying Salads and Dressings  Simple Sides and Sauces  International Cuisine-Spotlight on the [employer]  Zones  Delicious Desserts  Savory Soups  [employer] - Meals in a Astronomer Appetizers and Snacks  Comforting Weekend Breakfasts  One-Pot Wonders   Fast Evening Meals  Landscape architect Your Pritikin Plate  WORKSHOPS   Healthy Mindset (Psychosocial):  Focused Goals, Sustainable Changes Clinical staff led group instruction and group discussion with PowerPoint presentation and patient guidebook. To enhance the learning environment the use of posters, models and videos may be added. Patients will be able to apply effective goal setting strategies to establish at least one personal goal, and then take consistent, meaningful action toward that goal. They will learn to identify common barriers to achieving personal goals and develop strategies to overcome them. Patients will also gain an understanding of how our mind-set can impact our ability to achieve goals and the importance of cultivating a positive and growth-oriented mind-set. The purpose of this lesson is to provide patients with a deeper understanding of how to set and achieve personal goals, as well as the tools and strategies needed to overcome common obstacles which may arise along the way.  From Head to Heart: The Power of a Healthy Outlook  Clinical staff led group instruction and group discussion with PowerPoint presentation and patient guidebook. To enhance the learning environment the use of posters, models  and videos may be added. Patients will be able to recognize and describe the impact of emotions and mood on physical health. They will discover the importance of self-care and explore self-care practices which may work for them. Patients will also learn how to utilize the 4 C's to cultivate a healthier outlook and better manage stress and challenges. The purpose of this lesson is to demonstrate to patients how a healthy outlook is an essential part of maintaining good health, especially as they continue their  cardiac rehab journey.  Healthy Sleep for a Healthy Heart Clinical staff led group instruction and group discussion with PowerPoint presentation and patient guidebook. To enhance the learning environment the use of posters, models and videos may be added. At the conclusion of this workshop, patients will be able to demonstrate knowledge of the importance of sleep to overall health, well-being, and quality of life. They will understand the symptoms of, and treatments for, common sleep disorders. Patients will also be able to identify daytime and nighttime behaviors which impact sleep, and they will be able to apply these tools to help manage sleep-related challenges. The purpose of this lesson is to provide patients with a general overview of sleep and outline the importance of quality sleep. Patients will learn about a few of the most common sleep disorders. Patients will also be introduced to the concept of "sleep hygiene," and discover ways to self-manage certain sleeping problems through simple daily behavior changes. Finally, the workshop will motivate patients by clarifying the links between quality sleep and their goals of heart-healthy living.   Recognizing and Reducing Stress Clinical staff led group instruction and group discussion with PowerPoint presentation and patient guidebook. To enhance the learning environment the use of posters, models and videos may be added. At the conclusion of this workshop, patients will be able to understand the types of stress reactions, differentiate between acute and chronic stress, and recognize the impact that chronic stress has on their health. They will also be able to apply different coping mechanisms, such as reframing negative self-talk. Patients will have the opportunity to practice a variety of stress management techniques, such as deep abdominal breathing, progressive muscle relaxation, and/or guided imagery.  The purpose of this lesson is to educate  patients on the role of stress in their lives and to provide healthy techniques for coping with it.  Learning Barriers/Preferences:  Learning Barriers/Preferences - 07/22/23 1345       Learning Barriers/Preferences   Learning Barriers Sight   wears glasses   Learning Preferences Written Material;Skilled Demonstration          Education Topics:  Knowledge Questionnaire Score:  Knowledge Questionnaire Score - 07/22/23 1351       Knowledge Questionnaire Score   Pre Score 15/24          Core Components/Risk Factors/Patient Goals at Admission:  Personal Goals and Risk Factors at Admission - 07/27/23 1127       Core Components/Risk Factors/Patient Goals on Admission    Weight Management Yes;Weight Gain    Intervention Weight Management: Develop a combined nutrition and exercise program designed to reach desired caloric intake, while maintaining appropriate intake of nutrient and fiber, sodium and fats, and appropriate energy expenditure required for the weight goal.;Weight Management: Provide education and appropriate resources to help participant work on and attain dietary goals.    Expected Outcomes Short Term: Continue to assess and modify interventions until short term weight is achieved;Long Term: Adherence to nutrition and physical activity/exercise program  aimed toward attainment of established weight goal;Weight Maintenance: Understanding of the daily nutrition guidelines, which includes 25-35% calories from fat, 7% or less cal from saturated fats, less than 200mg  cholesterol, less than 1.5gm of sodium, & 5 or more servings of fruits and vegetables daily;Understanding recommendations for meals to include 15-35% energy as protein, 25-35% energy from fat, 35-60% energy from carbohydrates, less than 200mg  of dietary cholesterol, 20-35 gm of total fiber daily;Understanding of distribution of calorie intake throughout the day with the consumption of 4-5 meals/snacks;Weight Gain:  Understanding of general recommendations for a high calorie, high protein meal plan that promotes weight gain by distributing calorie intake throughout the day with the consumption for 4-5 meals, snacks, and/or supplements    Improve shortness of breath with ADL's Yes    Intervention Provide education, individualized exercise plan and daily activity instruction to help decrease symptoms of SOB with activities of daily living.    Expected Outcomes Short Term: Improve cardiorespiratory fitness to achieve a reduction of symptoms when performing ADLs;Long Term: Be able to perform more ADLs without symptoms or delay the onset of symptoms    Diabetes Yes    Intervention Provide education about signs/symptoms and action to take for hypo/hyperglycemia.;Provide education about proper nutrition, including hydration, and aerobic/resistive exercise prescription along with prescribed medications to achieve blood glucose in normal ranges: Fasting glucose 65-99 mg/dL    Expected Outcomes Short Term: Participant verbalizes understanding of the signs/symptoms and immediate care of hyper/hypoglycemia, proper foot care and importance of medication, aerobic/resistive exercise and nutrition plan for blood glucose control.;Long Term: Attainment of HbA1C < 7%.    Hypertension Yes    Intervention Provide education on lifestyle modifcations including regular physical activity/exercise, weight management, moderate sodium restriction and increased consumption of fresh fruit, vegetables, and low fat dairy, alcohol moderation, and smoking cessation.;Monitor prescription use compliance.    Expected Outcomes Short Term: Continued assessment and intervention until BP is < 140/74mm HG in hypertensive participants. < 130/86mm HG in hypertensive participants with diabetes, heart failure or chronic kidney disease.;Long Term: Maintenance of blood pressure at goal levels.    Lipids Yes    Intervention Provide education and support for  participant on nutrition & aerobic/resistive exercise along with prescribed medications to achieve LDL 70mg , HDL >40mg .    Expected Outcomes Short Term: Participant states understanding of desired cholesterol values and is compliant with medications prescribed. Participant is following exercise prescription and nutrition guidelines.;Long Term: Cholesterol controlled with medications as prescribed, with individualized exercise RX and with personalized nutrition plan. Value goals: LDL < 70mg , HDL > 40 mg.          Core Components/Risk Factors/Patient Goals Review:   Goals and Risk Factor Review     Row Name 05/05/23 1133 06/07/23 0853 07/27/23 1129 08/10/23 1755 09/07/23 1708     Core Components/Risk Factors/Patient Goals Review   Personal Goals Review Improve shortness of breath with ADL's;Develop more efficient breathing techniques such as purse lipped breathing and diaphragmatic breathing and practicing self-pacing with activity. Improve shortness of breath with ADL's;Develop more efficient breathing techniques such as purse lipped breathing and diaphragmatic breathing and practicing self-pacing with activity. Weight Management/Obesity;Lipids;Hypertension;Diabetes;Improve shortness of breath with ADL's Weight Management/Obesity;Lipids;Hypertension;Diabetes;Improve shortness of breath with ADL's Weight Management/Obesity;Lipids;Hypertension;Diabetes;Improve shortness of breath with ADL's   Review Tyler Hendricks has attended 4 sessions so far. Goal progressing for improving shortness of breath with ADL's. Goal progressing for developing more efficient breathing techniques such as purse lipped breathing and diaphragmatic breathing; and practicing self-pacing with  activity.  Tyler Hendricks is currently requiring 2-4 L while exercising to keep sats >88%. He is currently exercising on the Treadmill and the bike. We will continue to monitor her progress throughout the program. Tyler Hendricks was discharged from the program on  06/03/23 due to an upcoming surgery on 06/08/23. He did meet his goal for developing more efficient breathing techniques such as purse lipped breathing and diaphragmatic breathing; and practicing self-pacing with activity. He was able to demonstrate purse lip breathing when he was SOB. He also knew how to self pace himself based on his rate of perceived exertion scale. Goal was not met for improving his shortness of breath with ADL's. Keziah started cardiac rehab on 07/25/23. Vital signs were stable. Weight down 3.3 kg.  Oxygen  saturations stable on 2l/min Tyler Hendricks started cardiac rehab on 07/25/23. Vital signs have been stable. Weight down 0.9 kg.  Oxygen  saturations stable on 2l/min Tyler Hendricks is doing well with cardiac rehab when in attendance.  Vital signs have been stable. Weight is now  unchnaged from orientation weight as Tyler Hendricks is on OFEV .  Oxygen  saturations stable on 2l/min   Expected Outcomes To improve shortness of breath with ADL's and develop more efficient breathing techniques such as purse lipped breathing and diaphragmatic breathing; and practicing self-pacing with activity. Patient will continue to benefit from ongoing nutrition, exercise, and lifestyle modification Tyler Hendricks will continue to participate in cardiac rehab for exercise, nutrition and lifestyle modificaitons Tyler Hendricks will continue to participate in cardiac rehab for exercise, nutrition and lifestyle modificaitons Tyler Hendricks will continue to participate in cardiac rehab for exercise, nutrition and lifestyle modificaitons      Core Components/Risk Factors/Patient Goals at Discharge (Final Review):   Goals and Risk Factor Review - 09/07/23 1708       Core Components/Risk Factors/Patient Goals Review   Personal Goals Review Weight Management/Obesity;Lipids;Hypertension;Diabetes;Improve shortness of breath with ADL's    Review Tyler Hendricks is doing well with cardiac rehab when in attendance.  Vital signs have been stable. Weight is now  unchnaged from  orientation weight as Tyler Hendricks is on OFEV .  Oxygen  saturations stable on 2l/min    Expected Outcomes Tyler Hendricks will continue to participate in cardiac rehab for exercise, nutrition and lifestyle modificaitons          ITP Comments:  ITP Comments     Row Name 07/22/23 1104 07/27/23 1100 08/10/23 1753 09/07/23 1700     ITP Comments Gaylyn Keas, MD: Medical Director. Introduction to the [facility]/Intensive Cardiac Rehab. Initial orientation packet reviewed with the patient. 30 Day ITP Review. Eithan started cardiac rehab on 07/27/23. Lamberto did well with exercise. 30 Day ITP Review. Kolt started cardiac rehab on 07/27/23. Hilberto is off to a good start with exercise. 30 Day ITP Review. Tyler Hendricks has good participation  in cardiac rehab when in attendance.       Comments: See ITP comments.Monte Antonio RN BSN

## 2023-09-08 ENCOUNTER — Telehealth: Payer: Self-pay

## 2023-09-08 ENCOUNTER — Encounter (HOSPITAL_COMMUNITY)

## 2023-09-08 ENCOUNTER — Encounter (HOSPITAL_COMMUNITY): Admission: RE | Admit: 2023-09-08 | Source: Ambulatory Visit | Attending: Cardiology

## 2023-09-08 NOTE — Telephone Encounter (Signed)
 Copied from CRM [id number]. Topic: Clinical - Prescription Issue >> Sep 08, 2023  8:44 AM Margarette Shawl wrote: Reason for CRM:   Brian Campanile, with [facility], is contacting clinic regarding pt's OFEV  prescription. The PA for this medication is expiring today. Brian Campanile has attempted to fax the PA form through Cover My Meds; however, it will not allow her to.   She is reporting she will place the PA in his chart and provided the Cover My Meds key number of # [id number] to access the form  CB#  [phone]  Routing to the PA team

## 2023-09-08 NOTE — Telephone Encounter (Signed)
 Received notification from [facility] regarding a prior authorization for OFEV . Authorization has been APPROVED from 03/24/2023 to 09/07/2024.  Authorization # [id number]  Geraldene Kleine, PharmD, MPH, BCPS, CPP Clinical Pharmacist (Rheumatology and Pulmonology)

## 2023-09-08 NOTE — Telephone Encounter (Signed)
 Submitted an URGENT Prior Authorization request to [facility] for OFEV  via CoverMyMeds. Will update once we receive a response.  Key: Madelaine Scarpa

## 2023-09-09 ENCOUNTER — Other Ambulatory Visit: Payer: Self-pay

## 2023-09-09 NOTE — Progress Notes (Signed)
 Specialty Pharmacy Refill Coordination Note  Tyler Hendricks is a 73 y.o. male contacted today regarding refills of specialty medication(s) Bictegravir-Emtricitab-Tenofov (Biktarvy )   Patient requested Delivery   Delivery date: 09/10/23   Verified address: [address] [place] [address]   Medication will be filled on 09/09/23 or 09/10/23.

## 2023-09-10 ENCOUNTER — Encounter (HOSPITAL_COMMUNITY): Admission: RE | Admit: 2023-09-10 | Source: Ambulatory Visit | Attending: Cardiology

## 2023-09-10 ENCOUNTER — Encounter (HOSPITAL_COMMUNITY)

## 2023-09-13 ENCOUNTER — Encounter (HOSPITAL_COMMUNITY): Admission: RE | Admit: 2023-09-13 | Source: Ambulatory Visit | Attending: Cardiology | Admitting: Cardiology

## 2023-09-13 ENCOUNTER — Encounter (HOSPITAL_COMMUNITY)

## 2023-09-15 ENCOUNTER — Encounter (HOSPITAL_COMMUNITY)

## 2023-09-15 ENCOUNTER — Encounter (HOSPITAL_COMMUNITY)
Admission: RE | Admit: 2023-09-15 | Discharge: 2023-09-15 | Disposition: A | Discharge status: Home / Self Care | Source: Ambulatory Visit | Attending: Cardiology | Admitting: Cardiology

## 2023-09-15 DIAGNOSIS — Z48812 Encounter for surgical aftercare following surgery on the circulatory system: Secondary | ICD-10-CM | POA: Diagnosis not present

## 2023-09-15 DIAGNOSIS — Z952 Presence of prosthetic heart valve: Secondary | ICD-10-CM | POA: Diagnosis not present

## 2023-09-16 DIAGNOSIS — C44612 Basal cell carcinoma of skin of right upper limb, including shoulder: Secondary | ICD-10-CM | POA: Diagnosis not present

## 2023-09-17 ENCOUNTER — Encounter (HOSPITAL_COMMUNITY)

## 2023-09-20 ENCOUNTER — Encounter (HOSPITAL_COMMUNITY): Admission: RE | Admit: 2023-09-20 | Source: Ambulatory Visit | Attending: Cardiology | Admitting: Cardiology

## 2023-09-20 ENCOUNTER — Other Ambulatory Visit: Payer: Self-pay | Admitting: Pulmonary Disease

## 2023-09-20 ENCOUNTER — Encounter (HOSPITAL_COMMUNITY)

## 2023-09-20 DIAGNOSIS — R053 Chronic cough: Secondary | ICD-10-CM

## 2023-09-20 NOTE — Telephone Encounter (Unsigned)
 Copied from CRM [id number]. Topic: Clinical - Medication Refill >> Sep 20, 2023  8:25 AM Leila C wrote: Most Recent Pulmonary Care Visit:  Provider: NP, Madelin Stank Department:  [facility] Date: 07/20/23  Medication(s): HYDROcodone  bit-homatropine (HYCODAN ) 5-1.5 MG/5ML syrup    Has the patient contacted their pharmacy? Yes (Agent: If no, request that the patient contact the pharmacy for the refill. If patient does not wish to contact the pharmacy document the reason why and proceed with request.) (Agent: If yes, when and what did the pharmacy advise?)  This is the patient's preferred pharmacy:   [id number] [place], [place] - [address] AT [facility] [address] & [facility] [address] [place] [address] Phone: [phone] Fax: [phone]  Is this the correct pharmacy for this prescription? Yes If no, delete pharmacy and type the correct one.   Has the prescription been filled recently? No  Is the patient out of the medication? Yes  Has the patient been seen for an appointment in the last year OR does the patient have an upcoming appointment? Yes  Can we respond through MyChart? No, please call back [phone]  Agent: Please be advised that Rx refills may take up to 3 business days. We ask that you follow-up with your pharmacy.

## 2023-09-21 ENCOUNTER — Telehealth (HOSPITAL_BASED_OUTPATIENT_CLINIC_OR_DEPARTMENT_OTHER): Payer: Self-pay

## 2023-09-21 MED ORDER — HYDROCODONE BIT-HOMATROP MBR 5-1.5 MG/5ML PO SOLN: 5.0000 mL | Freq: Four times a day (QID) | ORAL | 0 refills | Status: DC | PRN

## 2023-09-21 NOTE — Telephone Encounter (Signed)
 Copied from CRM [id number]. Topic: Clinical - Medication Refill >> Sep 20, 2023  8:25 AM Leila C wrote: Most Recent Pulmonary Care Visit:  Provider: NP, Madelin Stank Department: [facility] Date: 07/20/23  Medication(s): HYDROcodone  bit-homatropine (HYCODAN ) 5-1.5 MG/5ML syrup    Has the patient contacted their pharmacy? Yes (Agent: If no, request that the patient contact the pharmacy for the refill. If patient does not wish to contact the pharmacy document the reason why and proceed with request.) (Agent: If yes, when and what did the pharmacy advise?)  This is the patient's preferred pharmacy:   [id number] [place], [place] - [address] AT [facility] OF [address] & [facility] [address] [place] [address] Phone: [phone] Fax: [phone]  Is this the correct pharmacy for this prescription? Yes If no, delete pharmacy and type the correct one.   Has the prescription been filled recently? No  Is the patient out of the medication? Yes  Has the patient been seen for an appointment in the last year OR does the patient have an upcoming appointment? Yes  Can we respond through MyChart? No, please call back [phone]  Agent: Please be advised that Rx refills may take up to 3 business days. We ask that you follow-up with your pharmacy. >> Sep 21, 2023  8:43 AM Chantha C wrote: Patient is checking on refill status, and want to know if the refill was done. Informed patient, refills can take up to 3 business day and it has been sent for Dr. Theophilus to review. Patient will wait and denies any other concerns.  >> Sep 20, 2023 12:05 PM Chantha C wrote: Patient is checking on medication refill for  HYDROcodone  bit-homatropine (HYCODAN ) 5-1.5 MG/5ML syrup. Informed patient, Dr. Theophilus is still seeing patient and CMA has sent for his advisement. Patient will wait for the response.

## 2023-09-21 NOTE — Telephone Encounter (Signed)
 Chart says Dr. Theophilus sent in today.

## 2023-09-22 ENCOUNTER — Encounter (HOSPITAL_COMMUNITY)

## 2023-09-22 ENCOUNTER — Encounter: Payer: Self-pay | Admitting: Emergency Medicine

## 2023-09-22 ENCOUNTER — Encounter (HOSPITAL_COMMUNITY): Admission: RE | Admit: 2023-09-22 | Source: Ambulatory Visit | Attending: Cardiology | Admitting: Cardiology

## 2023-09-22 ENCOUNTER — Ambulatory Visit: Admitting: Emergency Medicine

## 2023-09-22 VITALS — BP 138/74 | HR 61 | Temp 98.0°F | Ht 73.0 in | Wt 185.0 lb

## 2023-09-22 DIAGNOSIS — L089 Local infection of the skin and subcutaneous tissue, unspecified: Secondary | ICD-10-CM

## 2023-09-22 DIAGNOSIS — L723 Sebaceous cyst: Secondary | ICD-10-CM | POA: Diagnosis not present

## 2023-09-22 MED ORDER — CEFADROXIL 500 MG PO CAPS: 500.0000 mg | ORAL_CAPSULE | Freq: Two times a day (BID) | ORAL | 0 refills | Status: AC

## 2023-09-22 NOTE — Assessment & Plan Note (Signed)
 Small pilonidal cyst showing signs of infection. Recommend to start Duricef 500 mg twice a day for 7 days Recommend to use warm compresses several times during the day Nonfluctuant hard tender mass.  Not ready for incision and drainage Advised to contact the office if no better or worse during the next several days or weeks. Recommend follow-up with PCP

## 2023-09-22 NOTE — Progress Notes (Signed)
 Tyler Hendricks 73 y.o.   Chief Complaint  Patient presents with   Rash    Patient states it feels like a sore at the top of his bottom, at his crack area that started 3 weeks ago. Makes it hard for him to sit. He has not put anything on it,     HISTORY OF PRESENT ILLNESS: This is a 73 y.o. male complaining of painful lump to buttock area for the last 2 to 3 weeks No other associated symptoms No other complaints or medical concerns today.  Rash     Prior to Admission medications   Medication Sig Start Date End Date Taking? Authorizing Provider  amLODipine  (NORVASC ) 10 MG tablet Take 1 tablet (10 mg total) by mouth daily. 07/12/23 10/10/23 Yes Chandrasekhar, Mahesh A, MD  atenolol  (TENORMIN ) 50 MG tablet TAKE 1 TABLET BY MOUTH EVERY DAY 11/03/22  Yes Rollene Almarie LABOR, MD  bictegravir-emtricitabine -tenofovir  AF (BIKTARVY ) 50-200-25 MG TABS tablet Take 1 tablet by mouth daily. 01/01/23 01/01/24 Yes Calone, Gregory D, FNP  cholecalciferol (VITAMIN D3) 25 MCG (1000 UNIT) tablet Take 1,000 Units by mouth daily.   Yes [provider]  clopidogrel  (PLAVIX ) 75 MG tablet TAKE 1 TABLET BY MOUTH EVERY DAY 11/16/22  Yes Rollene Almarie LABOR, MD  diazepam  (VALIUM ) 10 MG tablet Take 1 tablet (10 mg total) by mouth as directed. Take 1/2 a tablet 1 hour prior to MRI. If needed take other half. Please have a driver drive you to and from MRI 07/20/23 07/19/24 Yes Sebastian Lamarr SAUNDERS, PA-C  HYDROcodone  bit-homatropine (HYCODAN ) 5-1.5 MG/5ML syrup Take 5 mLs by mouth every 6 (six) hours as needed (chronic cough - pulmonary fibrosis). 09/21/23  Yes Mannam, Praveen, MD  losartan -hydrochlorothiazide  (HYZAAR ) 100-25 MG tablet Take 1 tablet by mouth daily. 11/03/22  Yes Rollene Almarie LABOR, MD  Multiple Vitamin (MULTIVITAMIN) tablet Take 1 tablet by mouth daily.   Yes [provider]  OFEV  100 MG CAPS TAKE 1 CAPSULE BY MOUTH 2 TIMES A DAY WITH FOOD. TAKE 12 HOURS APART. 08/17/23  Yes Mannam,  Praveen, MD  Oxycodone  HCl 10 MG TABS Take 10 mg by mouth 3 (three) times daily as needed (Pain). 10/30/20  Yes [provider]  rosuvastatin  (CRESTOR ) 10 MG tablet Take 1 tablet (10 mg total) by mouth daily. Patient taking differently: Take 10 mg by mouth at bedtime. 11/03/22  Yes Rollene Almarie LABOR, MD  SYNJARDY  XR 12.07-998 MG TB24 Take 2 tablets by mouth daily. Patient taking differently: Take 2 tablets by mouth daily. Pt now taking twice a day. 11/03/22  Yes Rollene Almarie LABOR, MD  VASCEPA  1 g capsule Take 2 capsules (2 g total) by mouth 2 (two) times daily. 11/03/22  Yes Rollene Almarie LABOR, MD    Allergies  Allergen Reactions   Integrilin [Eptifibatide] Other (See Comments)    Bleeding (non-specific)   Lopressor [Metoprolol Tartrate] Rash and Other (See Comments)    Bleeding (non-specific)    Penicillins Diarrhea and Rash    Patient Active Problem List   Diagnosis Date Noted   At increased risk for cardiovascular disease 07/16/2023   S/P TAVR (transcatheter aortic valve replacement) 06/08/2023   Acute on chronic heart failure with preserved ejection fraction (HFpEF) (HCC) 06/08/2023   Hepatitis C    Severe aortic stenosis 02/15/2023   Pain of finger of left hand 01/01/2023   Pain due to onychomycosis of toenails of both feet 10/08/2022   Degeneration of lumbar intervertebral disc 02/18/2022   Long-term current  use of opiate analgesic 06/11/2020   Lumbar pain 04/24/2020   Degenerative lumbar spinal stenosis 04/24/2020   Scoliosis deformity of spine 04/24/2020   Neural foraminal stenosis of cervical spine 01/03/2020   Chronic chest wall pain 11/16/2019   Hyperlipidemia associated with type 2 diabetes mellitus (HCC) 11/15/2019   Benign prostatic hyperplasia without lower urinary tract symptoms 11/15/2019   Interstitial pulmonary disease (HCC) 07/18/2019   Chronic bilateral low back pain without sciatica 03/02/2019   Muscle cramps 03/02/2019   Hepatic cirrhosis  (HCC) 01/08/2019   Secondary esophageal varices without bleeding (HCC) 01/08/2019   Intestinal metaplasia of gastric mucosa 01/08/2019   Chronic cough 06/24/2018   Healthcare maintenance 05/11/2018   Hx of colonic polyp 04/27/2018   Ventral hernia without obstruction or gangrene 04/27/2018   Thrombocytopenia (HCC) 04/27/2018   Hypersplenism 04/27/2018   Coronary artery disease of native artery of native heart with stable angina pectoris (HCC) 02/25/2018   Obstructive sleep apnea 10/25/2017   Extrinsic asthma without complication 08/25/2017   Vitamin D  deficiency 08/25/2017   HIV disease (HCC) 04/15/2017   Primary osteoarthritis of both knees 04/15/2017   Type 2 diabetes with complication (HCC) 04/15/2017   Essential hypertension 04/15/2017    Past Medical History:  Diagnosis Date   CAD (coronary artery disease)    Diabetes mellitus without complication (HCC)    Essential (primary) hypertension    Gallstones    Hepatic cirrhosis (HCC)    Hepatitis B core antibody positive 01/08/2019   Hepatitis C    HIV infection (HCC)    Hyperlipidemia    Hypertension    Internal hemorrhoids    Kidney stones    Pulmonary fibrosis (HCC)    S/P TAVR (transcatheter aortic valve replacement) 06/08/2023   s/p TAVR with a 26mm Edwards S3UR via the TF approach by Dr. Verlin & Dr. Maryjane   Severe aortic stenosis    Sleep apnea    severe, on CPAP   Tubular adenoma of colon     Past Surgical History:  Procedure Laterality Date   CARDIAC CATHETERIZATION Left 04/2013   chlecystectomy     CHOLECYSTECTOMY     COLONOSCOPY  05/02/2013   GAS/FLUID EXCHANGE Right 12/30/2021   Procedure: GAS/FLUID EXCHANGE;  Surgeon: Tobie Baptist, MD;  Location: [facility] OR;  Service: Ophthalmology;  Laterality: Right;  SF6   INTRAOPERATIVE TRANSTHORACIC ECHOCARDIOGRAM N/A 06/08/2023   Procedure: ECHOCARDIOGRAM, TRANSTHORACIC;  Surgeon: Verlin Lonni BIRCH, MD;  Location: [facility] INVASIVE CV LAB;  Service:  Cardiovascular;  Laterality: N/A;   PARS PLANA VITRECTOMY Right 12/30/2021   Procedure: PARS PLANA VITRECTOMY WITH 25 GAUGE;  Surgeon: Tobie Baptist, MD;  Location: [facility] OR;  Service: Ophthalmology;  Laterality: Right;   PERCUTANEOUS CORONARY STENT INTERVENTION (PCI-S)     PHOTOCOAGULATION WITH LASER Right 12/30/2021   Procedure: PHOTOCOAGULATION WITH LASER;  Surgeon: Tobie Baptist, MD;  Location: [facility] OR;  Service: Ophthalmology;  Laterality: Right;   POLYPECTOMY     RIGHT/LEFT HEART CATH AND CORONARY ANGIOGRAPHY N/A 04/23/2023   Procedure: RIGHT/LEFT HEART CATH AND CORONARY ANGIOGRAPHY;  Surgeon: Elmira Newman PARAS, MD;  Location: [facility] INVASIVE CV LAB;  Service: Cardiovascular;  Laterality: N/A;   UMBILICAL HERNIA REPAIR      Social History   Socioeconomic History   Marital status: Married    Spouse name: Not on file   Number of children: 1   Years of education: Not on file   Highest education level: 9th grade  Occupational History   Occupation: retired  Occupation: Retired Psychologist, forensic  Tobacco Use   Smoking status: Former    Current packs/day: 0.00    Average packs/day: 2.0 packs/day for 20.0 years (40.0 ttl pk-yrs)    Types: Cigarettes    Start date: 15    Quit date: 1994    Years since quitting: 31.5    Passive exposure: Past   Smokeless tobacco: Never  Vaping Use   Vaping status: Never Used  Substance and Sexual Activity   Alcohol use: Not Currently    Comment: social-occ beer   Drug use: No   Sexual activity: Yes  Other Topics Concern   Not on file  Social History Narrative   Not on file   Social Drivers of Health   Financial Resource Strain: Low Risk  (11/02/2022)   Overall Financial Resource Strain (CARDIA)    Difficulty of Paying Living Expenses: Not hard at all  Food Insecurity: No Food Insecurity (06/10/2023)   Hunger Vital Sign    Worried About Running Out of Food in the Last Year: Never true    Ran Out of Food in the Last Year: Never  true  Transportation Needs: No Transportation Needs (06/10/2023)   PRAPARE - Administrator, Civil Service (Medical): No    Lack of Transportation (Non-Medical): No  Physical Activity: Insufficiently Active (11/02/2022)   Exercise Vital Sign    Days of Exercise per Week: 2 days    Minutes of Exercise per Session: 30 min  Stress: Stress Concern Present (11/02/2022)   [employer] of Occupational Health - Occupational Stress Questionnaire    Feeling of Stress : To some extent  Social Connections: Socially Isolated (06/09/2023)   Social Connection and Isolation Panel    Frequency of Communication with Friends and Family: Twice a week    Frequency of Social Gatherings with Friends and Family: Never    Attends Religious Services: Never    Database administrator or Organizations: No    Attends Banker Meetings: Never    Marital Status: Married  Catering manager Violence: Patient Unable To Answer (06/10/2023)   Humiliation, Afraid, Rape, and Kick questionnaire    Fear of Current or Ex-Partner: Patient unable to answer    Emotionally Abused: Patient unable to answer    Physically Abused: Patient unable to answer    Sexually Abused: Patient unable to answer    Family History  Problem Relation Age of Onset   Emphysema Mother        pulmonary   Heart attack Father 45   Lung cancer Sister    Esophageal cancer Brother    Esophageal cancer Brother    Colon cancer Neg Hx    Inflammatory bowel disease Neg Hx    Liver disease Neg Hx    Pancreatic cancer Neg Hx    Rectal cancer Neg Hx    Stomach cancer Neg Hx    Colon polyps Neg Hx    Sleep apnea Neg Hx      Review of Systems  Skin:  Positive for rash.    Vitals:   09/22/23 0938  BP: 138/74  Pulse: 61  Temp: 98 F (36.7 C)  SpO2: 95%    Physical Exam Vitals reviewed.  Constitutional:      Appearance: Normal appearance.  HENT:     Head: Normocephalic.  Eyes:     Extraocular Movements:  Extraocular movements intact.  Cardiovascular:     Rate and Rhythm: Normal rate.  Pulmonary:  Effort: Pulmonary effort is normal.  Skin:    General: Skin is warm and dry.     Comments: Small tender nonfluctuant cyst to left pilonidal area Mild erythema  Neurological:     Mental Status: He is alert and oriented to person, place, and time.  Psychiatric:        Mood and Affect: Mood normal.        Behavior: Behavior normal.      ASSESSMENT & PLAN: A total of 32 minutes was spent with the patient and counseling/coordination of care regarding preparing for this visit, review of most recent office visit notes, review of multiple chronic medical conditions under management, review of all medications, diagnosis of pilonidal cyst with early infection and need for antibiotics, pain management, prognosis, documentation and need for follow-up.  Problem List Items Addressed This Visit       Musculoskeletal and Integument   Infected sebaceous cyst - Primary   Small pilonidal cyst showing signs of infection. Recommend to start Duricef 500 mg twice a day for 7 days Recommend to use warm compresses several times during the day Nonfluctuant hard tender mass.  Not ready for incision and drainage Advised to contact the office if no better or worse during the next several days or weeks. Recommend follow-up with PCP      Relevant Medications   cefadroxil (DURICEF) 500 MG capsule   Patient Instructions  Pocket of Fluid in the Skin (Epidermoid Cyst): What to Know  An epidermoid cyst is a small lump under your skin. The cyst contains a substance that is thick and oily. What are the causes? A blocked hair follicle. A hair curls and re-enters the skin instead of growing straight out of the skin. A blocked pore. Irritated skin. An injury to the skin. Certain conditions that are passed from parent to child. Human papillomavirus (HPV). This happens rarely when cysts occur on the bottom of the  feet. Long-term sun damage to the skin. What increases the risk? Having acne. Being male. Having an injury to the skin. Being past puberty. Certain conditions that are passed down through family (genetic disorder). What are the signs or symptoms? The only sign of this type of cyst may be a small, painless lump under the skin. These cysts are usually painless, but they can get infected. Symptoms of infection may include: Redness. Inflammation. Tenderness. Warmth. Fever. A bad-smelling substance that drains from the cyst. Pus that drains from the cyst. How is this treated? If a cyst becomes inflamed, treatment may include: Opening and draining the cyst. Antibiotics. Shots of medicines called steroids that help lessen inflammation. Surgery to remove the cyst if it's large, painful, or could turn into cancer. Do not try to open or squeeze a cyst yourself. Follow these instructions at home: Medicines Take your medicines only as told. If you were given antibiotics, take them as told. Do not stop taking them even if you start to feel better. General instructions Keep the area around your cyst clean and dry. Wear loose, dry clothing. Avoid touching your cyst. Check your cyst every day for signs of infection. Check for: Redness, swelling, or pain. Fluid or blood. Warmth. Pus or a bad smell. Keep all follow-up visits to make sure there's no discomfort or infection. Contact a doctor if: You have any signs of infection. Your cyst doesn't get better or gets worse. You get a cyst that looks different from other cysts you've had. You have a fever. You have redness that  spreads from the cyst. This information is not intended to replace advice given to you by your health care provider. Make sure you discuss any questions you have with your health care provider. Document Revised: 10/23/2022 Document Reviewed: 10/23/2022 Elsevier Patient Education  2024 Elsevier Inc.    Emil Schaumann, MD [facility]

## 2023-09-22 NOTE — Patient Instructions (Signed)
 Pocket of Fluid in the Skin (Epidermoid Cyst): What to Know  An epidermoid cyst is a small lump under your skin. The cyst contains a substance that is thick and oily. What are the causes? A blocked hair follicle. A hair curls and re-enters the skin instead of growing straight out of the skin. A blocked pore. Irritated skin. An injury to the skin. Certain conditions that are passed from parent to child. Human papillomavirus (HPV). This happens rarely when cysts occur on the bottom of the feet. Long-term sun damage to the skin. What increases the risk? Having acne. Being male. Having an injury to the skin. Being past puberty. Certain conditions that are passed down through family (genetic disorder). What are the signs or symptoms? The only sign of this type of cyst may be a small, painless lump under the skin. These cysts are usually painless, but they can get infected. Symptoms of infection may include: Redness. Inflammation. Tenderness. Warmth. Fever. A bad-smelling substance that drains from the cyst. Pus that drains from the cyst. How is this treated? If a cyst becomes inflamed, treatment may include: Opening and draining the cyst. Antibiotics. Shots of medicines called steroids that help lessen inflammation. Surgery to remove the cyst if it's large, painful, or could turn into cancer. Do not try to open or squeeze a cyst yourself. Follow these instructions at home: Medicines Take your medicines only as told. If you were given antibiotics, take them as told. Do not stop taking them even if you start to feel better. General instructions Keep the area around your cyst clean and dry. Wear loose, dry clothing. Avoid touching your cyst. Check your cyst every day for signs of infection. Check for: Redness, swelling, or pain. Fluid or blood. Warmth. Pus or a bad smell. Keep all follow-up visits to make sure there's no discomfort or infection. Contact a doctor if: You have  any signs of infection. Your cyst doesn't get better or gets worse. You get a cyst that looks different from other cysts you've had. You have a fever. You have redness that spreads from the cyst. This information is not intended to replace advice given to you by your health care provider. Make sure you discuss any questions you have with your health care provider. Document Revised: 10/23/2022 Document Reviewed: 10/23/2022 Elsevier Patient Education  2024 [employer].

## 2023-09-23 ENCOUNTER — Encounter: Payer: Self-pay | Admitting: Podiatry

## 2023-09-23 ENCOUNTER — Ambulatory Visit: Admitting: Podiatry

## 2023-09-23 DIAGNOSIS — E118 Type 2 diabetes mellitus with unspecified complications: Secondary | ICD-10-CM

## 2023-09-23 DIAGNOSIS — B351 Tinea unguium: Secondary | ICD-10-CM

## 2023-09-23 DIAGNOSIS — M79675 Pain in left toe(s): Secondary | ICD-10-CM

## 2023-09-23 DIAGNOSIS — M79674 Pain in right toe(s): Secondary | ICD-10-CM

## 2023-09-23 NOTE — Progress Notes (Signed)
 This patient returns to my office for at risk foot care.  This patient requires this care by a professional since this patient will be at risk due to having diabetes and HIV.  This patient is unable to cut nails himself since the patient cannot reach his nails.These nails are painful walking and wearing shoes.  This patient presents for at risk foot care today.  General Appearance  Alert, conversant and in no acute stress.  Vascular  Dorsalis pedis and posterior tibial  pulses are palpable  bilaterally.  Capillary return is within normal limits  bilaterally. Temperature is within normal limits  bilaterally.  Neurologic  Senn-Weinstein monofilament wire test within normal limits  bilaterally. Muscle power within normal limits bilaterally.  Nails Thick disfigured discolored nails with subungual debris  hallux nails bilaterally. No evidence of bacterial infection or drainage bilaterally.  Orthopedic  No limitations of motion  feet .  No crepitus or effusions noted.  No bony pathology or digital deformities noted.  Skin  normotropic skin with no porokeratosis noted bilaterally.  No signs of infections or ulcers noted.     Onychomycosis  Pain in right toes  Pain in left toes  Consent was obtained for treatment procedures.   Mechanical debridement of nails 1-5  bilaterally performed with a nail nipper.  Filed with dremel without incident.    Return office visit   4 months                   Told patient to return for periodic foot care and evaluation due to potential at risk complications.   Helane Gunther DPM

## 2023-09-27 ENCOUNTER — Encounter (HOSPITAL_COMMUNITY)
Admission: RE | Admit: 2023-09-27 | Discharge: 2023-09-27 | Disposition: A | Discharge status: Home / Self Care | Source: Ambulatory Visit | Attending: Pulmonary Disease | Admitting: Pulmonary Disease

## 2023-09-27 ENCOUNTER — Encounter (HOSPITAL_COMMUNITY)

## 2023-09-27 DIAGNOSIS — Z952 Presence of prosthetic heart valve: Secondary | ICD-10-CM | POA: Insufficient documentation

## 2023-09-27 DIAGNOSIS — Z48812 Encounter for surgical aftercare following surgery on the circulatory system: Secondary | ICD-10-CM | POA: Insufficient documentation

## 2023-09-29 ENCOUNTER — Encounter (HOSPITAL_COMMUNITY): Payer: Self-pay | Admitting: *Deleted

## 2023-09-29 ENCOUNTER — Ambulatory Visit: Admitting: Adult Health

## 2023-09-29 ENCOUNTER — Encounter: Payer: Self-pay | Admitting: Adult Health

## 2023-09-29 ENCOUNTER — Encounter (HOSPITAL_COMMUNITY)

## 2023-09-29 DIAGNOSIS — Z952 Presence of prosthetic heart valve: Secondary | ICD-10-CM

## 2023-09-29 NOTE — Progress Notes (Signed)
 Cardiac Individual Treatment Plan  Patient Details  Name: Tyler Hendricks MRN: [id number] Date of Birth: [birth year] Referring Provider:   Flowsheet Row INTENSIVE CARDIAC REHAB ORIENT from 07/22/2023 in [facility]  Referring Provider Newman Lawrence, MD    Initial Encounter Date:  Flowsheet Row INTENSIVE CARDIAC REHAB ORIENT from 07/22/2023 in [facility]  Date 07/22/23    Visit Diagnosis: 06/08/23 S/P TAVR (transcatheter aortic valve replacement)  Patient's Home Medications on Admission:  Current Outpatient Medications:    amLODipine  (NORVASC ) 10 MG tablet, Take 1 tablet (10 mg total) by mouth daily., Disp: 180 tablet, Rfl: 3   atenolol  (TENORMIN ) 50 MG tablet, TAKE 1 TABLET BY MOUTH EVERY DAY, Disp: 90 tablet, Rfl: 3   bictegravir-emtricitabine -tenofovir  AF (BIKTARVY ) 50-200-25 MG TABS tablet, Take 1 tablet by mouth daily., Disp: 30 tablet, Rfl: 11   cholecalciferol (VITAMIN D3) 25 MCG (1000 UNIT) tablet, Take 1,000 Units by mouth daily., Disp: , Rfl:    clopidogrel  (PLAVIX ) 75 MG tablet, TAKE 1 TABLET BY MOUTH EVERY DAY, Disp: 90 tablet, Rfl: 3   diazepam  (VALIUM ) 10 MG tablet, Take 1 tablet (10 mg total) by mouth as directed. Take 1/2 a tablet 1 hour prior to MRI. If needed take other half. Please have a driver drive you to and from MRI, Disp: 1 tablet, Rfl: 0   HYDROcodone  bit-homatropine (HYCODAN ) 5-1.5 MG/5ML syrup, Take 5 mLs by mouth every 6 (six) hours as needed (chronic cough - pulmonary fibrosis)., Disp: 473 mL, Rfl: 0   losartan -hydrochlorothiazide  (HYZAAR ) 100-25 MG tablet, Take 1 tablet by mouth daily., Disp: 90 tablet, Rfl: 3   Multiple Vitamin (MULTIVITAMIN) tablet, Take 1 tablet by mouth daily., Disp: , Rfl:    OFEV  100 MG CAPS, TAKE 1 CAPSULE BY MOUTH 2 TIMES A DAY WITH FOOD. TAKE 12 HOURS APART., Disp: 180 capsule, Rfl: 1   Oxycodone  HCl 10 MG TABS, Take 10  mg by mouth 3 (three) times daily as needed (Pain)., Disp: , Rfl:    rosuvastatin  (CRESTOR ) 10 MG tablet, Take 1 tablet (10 mg total) by mouth daily. (Patient taking differently: Take 10 mg by mouth at bedtime.), Disp: 90 tablet, Rfl: 3   SYNJARDY  XR 12.07-998 MG TB24, Take 2 tablets by mouth daily. (Patient taking differently: Take 2 tablets by mouth daily. Pt now taking twice a day.), Disp: 180 tablet, Rfl: 3   VASCEPA  1 g capsule, Take 2 capsules (2 g total) by mouth 2 (two) times daily., Disp: 360 capsule, Rfl: 3  Past Medical History: Past Medical History:  Diagnosis Date   CAD (coronary artery disease)    Diabetes mellitus without complication (HCC)    Essential (primary) hypertension    Gallstones    Hepatic cirrhosis (HCC)    Hepatitis B core antibody positive 01/08/2019   Hepatitis C    HIV infection (HCC)    Hyperlipidemia    Hypertension    Internal hemorrhoids    Kidney stones    Pulmonary fibrosis (HCC)    S/P TAVR (transcatheter aortic valve replacement) 06/08/2023   s/p TAVR with a 26mm Edwards S3UR via the TF approach by Dr. Verlin & Dr. Maryjane   Severe aortic stenosis    Sleep apnea    severe, on CPAP   Tubular adenoma of colon     Tobacco Use: Social History   Tobacco Use  Smoking Status Former   Current packs/day: 0.00  Average packs/day: 2.0 packs/day for 20.0 years (40.0 ttl pk-yrs)   Types: Cigarettes   Start date: 26   Quit date: 27   Years since quitting: 31.5   Passive exposure: Past  Smokeless Tobacco Never    Labs: Review Flowsheet  More data exists      Latest Ref Rng & Units 05/06/2022 11/03/2022 12/10/2022 04/23/2023 06/08/2023  Labs for ITP Cardiac and Pulmonary Rehab  Cholestrol 0 - 200 mg/dL - 857  - - -  LDL (calc) 0 - 99 mg/dL - 58  - - -  HDL-C >60.99 mg/dL - 29.79  - - -  Trlycerides 0.0 - 149.0 mg/dL - 31.9  - - -  Hemoglobin A1c 4.6 - 6.5 % 6.5  7.0  6.8  - -  PH, Arterial 7.35 - 7.45 - - - 7.391  -  PCO2 arterial 32 -  48 mmHg - - - 36.4  -  Bicarbonate 20.0 - 28.0 mmol/L - - - 25.8  24.3  25.4  22.1  25.6  -  TCO2 22 - 32 mmol/L - - - 27  26  27  23  27  26  24    Acid-base deficit 0.0 - 2.0 mmol/L - - - 1.0  2.0  -  O2 Saturation % - - - 80  82  80  97  81  -    Details       Multiple values from one day are sorted in reverse-chronological order         Capillary Blood Glucose: Lab Results  Component Value Date   GLUCAP 160 (H) 08/13/2023   GLUCAP 116 (H) 07/30/2023   GLUCAP 178 (H) 07/28/2023   GLUCAP 178 (H) 07/28/2023   GLUCAP 127 (H) 07/26/2023     Exercise Target Goals: Exercise Program Goal: Individual exercise prescription set using results from initial 6 min walk test and THRR while considering  patient's activity barriers and safety.   Exercise Prescription Goal: Initial exercise prescription builds to 30-45 minutes a day of aerobic activity, 2-3 days per week.  Home exercise guidelines will be given to patient during program as part of exercise prescription that the participant will acknowledge.  Activity Barriers & Risk Stratification:   6 Minute Walk:   Oxygen  Initial Assessment:   Oxygen  Re-Evaluation:   Oxygen  Discharge (Final Oxygen  Re-Evaluation):   Initial Exercise Prescription:   Perform Capillary Blood Glucose checks as needed.  Exercise Prescription Changes:   Exercise Prescription Changes     Row Name 08/13/23 1400 09/01/23 1400           Response to Exercise   Blood Pressure (Admit) 130/60 110/64      Blood Pressure (Exercise) 142/70 130/54      Blood Pressure (Exit) 112/72 120/64      Heart Rate (Admit) 61 bpm 67 bpm      Heart Rate (Exercise) 93 bpm 91 bpm      Heart Rate (Exit) 70 bpm 61 bpm      Oxygen  Saturation (Admit) 96 % 96 %      Oxygen  Saturation (Exercise) 93 % 93 %      Rating of Perceived Exertion (Exercise) 12.5 11      Perceived Dyspnea (Exercise) 1 0      Symptoms -- None      Comments -- Reviewed METs and goals       Duration Continue with 30 min of aerobic exercise without signs/symptoms of physical distress. Continue with 30  min of aerobic exercise without signs/symptoms of physical distress.      Intensity THRR unchanged THRR unchanged        Progression   Progression Continue to progress workloads to maintain intensity without signs/symptoms of physical distress. Continue to progress workloads to maintain intensity without signs/symptoms of physical distress.      Average METs 3 2.55        Resistance Training   Training Prescription Yes No      Weight 3 lbs --      Reps 10-15 --      Time 5 Minutes --        Oxygen    Oxygen  Continuous Continuous      Liters 3 3        Bike   Level 4 3      Watts 27.9 --      Minutes 15 15      METs 2.99 2.68        NuStep   Level 3 3      SPM -- 83      Minutes 15 15      METs -- 2.4         Exercise Comments:   Exercise Comments     Row Name 08/13/23 1430 09/01/23 1442 09/29/23 0926       Exercise Comments Reviewed METs and Goals with pt. METs have increased since starting the program. Pt is still progressing towards his goals. Provided strategies that will help increase his stamina and strength. Reviewed METs and Goals with pt. METs have increased since starting the program. Pt is still progressing towards his goals. Pt due for review of METs and goals but did not attend today, will complete upon his return.        Exercise Goals and Review:   Exercise Goals Re-Evaluation :  Exercise Goals Re-Evaluation     Row Name 08/13/23 1425 09/01/23 1440           Exercise Goal Re-Evaluation   Exercise Goals Review Increase Physical Activity;Able to understand and use Dyspnea scale;Understanding of Exercise Prescription;Increase Strength and Stamina;Knowledge and understanding of Target Heart Rate Range (THRR);Able to understand and use rate of perceived exertion (RPE) scale Increase Physical Activity;Able to understand and use Dyspnea  scale;Understanding of Exercise Prescription;Increase Strength and Stamina;Knowledge and understanding of Target Heart Rate Range (THRR);Able to understand and use rate of perceived exertion (RPE) scale      Comments Reviewed METs and Goals with pt. METs have increased since starting the program. Pt is still progressing towards his goals. Provided strategies that will help increase his stamina and strength. Reviewed METs and goals. Pt has been absent from the program due to family issues. Pt reports some progress on his goal of increased strength and stamina. Pt voices ne sees some impeovement with his breathing, especailly like when walking his dog.      Expected Outcomes Will continue to monitor the patient and progress exercise workloads as tolerated. Will continue to monitor the patient and progress exercise workloads as tolerated.         Discharge Exercise Prescription (Final Exercise Prescription Changes):  Exercise Prescription Changes - 09/01/23 1400       Response to Exercise   Blood Pressure (Admit) 110/64    Blood Pressure (Exercise) 130/54    Blood Pressure (Exit) 120/64    Heart Rate (Admit) 67 bpm    Heart Rate (Exercise) 91 bpm    Heart Rate (Exit) 61  bpm    Oxygen  Saturation (Admit) 96 %    Oxygen  Saturation (Exercise) 93 %    Rating of Perceived Exertion (Exercise) 11    Perceived Dyspnea (Exercise) 0    Symptoms None    Comments Reviewed METs and goals    Duration Continue with 30 min of aerobic exercise without signs/symptoms of physical distress.    Intensity THRR unchanged      Progression   Progression Continue to progress workloads to maintain intensity without signs/symptoms of physical distress.    Average METs 2.55      Resistance Training   Training Prescription No      Oxygen    Oxygen  Continuous    Liters 3      Bike   Level 3    Minutes 15    METs 2.68      NuStep   Level 3    SPM 83    Minutes 15    METs 2.4          Nutrition:   Target Goals: Understanding of nutrition guidelines, daily intake of sodium 1500mg , cholesterol 200mg , calories 30% from fat and 7% or less from saturated fats, daily to have 5 or more servings of fruits and vegetables.  Biometrics:    Nutrition Therapy Plan and Nutrition Goals:  Nutrition Therapy & Goals - 09/27/23 1615       Nutrition Therapy   Diet Heart Healthy Diet    Drug/Food Interactions Statins/Certain Fruits      Personal Nutrition Goals   Nutrition Goal Patient to identify strategies for reducing cardiovascular risk by attending the [employer] education and nutrition series weekly.   goal not met.   Personal Goal #2 Patient to improve diet quality by using the plate method as a guide for meal planning to include lean protein/plant protein, fruits, vegetables, whole grains, nonfat dairy as part of a well-balanced diet.   goal not met.   Personal Goal #3 Patient to maintain weight/ identify strategies for weight gain as needed of 0.5-2.0# per week.   goals not met.   Comments Goals not met. Attendance remains variable. Tyler Hendricks has medical history of pulmonary fibrosis, s/p TAVR, sleep apnea, HIV, chronic respiratory failure, cirrhosis, DM2, hyperlipidemia, CAD, HTN. He does not attend the group education component of cardiac rehab. Patient reports concerns of weight loss since starting ofev ; he is down 12.3# over the last year (6/72024 office visit, 91.6kg); he is down and additional 9.9# since starting intensive cardiac rehab. He continues Ofev  at this time. He is currently drinking 1 Boost per day (240kcals, 10g protein). Have discussed strategies for weight gain including nutrition supplements, eating frequency, and calorie denisity. A1c is at goal <7%. LDL/HDL are at goal. Per documentation, patient has been inconsistent with CPAP and O2 use. Patient will benefit from participation in intensive cardiac rehab for nutrition, exercise, and lifestyle modification.      Intervention Plan    Intervention Prescribe, educate and counsel regarding individualized specific dietary modifications aiming towards targeted core components such as weight, hypertension, lipid management, diabetes, heart failure and other comorbidities.;Nutrition handout(s) given to patient.    Expected Outcomes Short Term Goal: Understand basic principles of dietary content, such as calories, fat, sodium, cholesterol and nutrients.;Long Term Goal: Adherence to prescribed nutrition plan.          Nutrition Assessments:  MEDIFICTS Score Key: >=70 Need to make dietary changes  40-70 Heart Healthy Diet <= 40 Therapeutic Level Cholesterol Diet   Flowsheet Row  INTENSIVE CARDIAC REHAB from 07/26/2023 in [facility]  Picture Your Plate Total Score on Admission 47   Picture Your Plate Scores: <59 Unhealthy dietary pattern with much room for improvement. 41-50 Dietary pattern unlikely to meet recommendations for good health and room for improvement. 51-60 More healthful dietary pattern, with some room for improvement.  >60 Healthy dietary pattern, although there may be some specific behaviors that could be improved.    Nutrition Goals Re-Evaluation:  Nutrition Goals Re-Evaluation     Row Name 08/31/23 1146 09/27/23 1615           Goals   Current Weight 194 lb 3.6 oz (88.1 kg)  weight from last attended session on 08/13/23 186 lb 8.2 oz (84.6 kg)      Comment A1c 6.8, LDL 58, HDL 70 A1c 6.8, LDL 58, HDL 70      Expected Outcome Goals not met. Attendance remains variable. Tyler Hendricks has medical history of pulmonary fibrosis, s/p TAVR, sleep apnea, HIV, chronic respiratory failure, cirrhosis, DM2, hyperlipidemia, CAD, HTN. Patient reports concerns of weight loss since starting ofev ; he is down 12.3# over the last year (6/72024 office visit, 91.6kg); he is down and additional 2.2# since starting intensive cardiac rehab. He continues Ofev  at this time. He is  currently drinking 1 Boost per day (240kcals, 10g protein). Will continue to discuss strategies for weight gain including calorie density, nutrition supplements, eating frequency, etc. A1c is at goal <7%. LDL/HDL are at goal. Per documentation, patient has been inconsistent with CPAP and O2 use. Patient will benefit from participation in intensive cardiac rehab for nutrition, exercise, and lifestyle modification. Goals not met. Attendance remains variable. Tyler Hendricks has medical history of pulmonary fibrosis, s/p TAVR, sleep apnea, HIV, chronic respiratory failure, cirrhosis, DM2, hyperlipidemia, CAD, HTN. He does not attend the group education component of cardiac rehab. Patient reports concerns of weight loss since starting ofev ; he is down 12.3# over the last year (6/72024 office visit, 91.6kg); he is down and additional 9.9# since starting intensive cardiac rehab. He continues Ofev  at this time. He is currently drinking 1 Boost per day (240kcals, 10g protein). Have discussed strategies for weight gain including nutrition supplements, eating frequency, and calorie denisity. A1c is at goal <7%. LDL/HDL are at goal. Per documentation, patient has been inconsistent with CPAP and O2 use. Patient will benefit from participation in intensive cardiac rehab for nutrition, exercise, and lifestyle modification.         Nutrition Goals Re-Evaluation:  Nutrition Goals Re-Evaluation     Row Name 08/31/23 1146 09/27/23 1615           Goals   Current Weight 194 lb 3.6 oz (88.1 kg)  weight from last attended session on 08/13/23 186 lb 8.2 oz (84.6 kg)      Comment A1c 6.8, LDL 58, HDL 70 A1c 6.8, LDL 58, HDL 70      Expected Outcome Goals not met. Attendance remains variable. Tyler Hendricks has medical history of pulmonary fibrosis, s/p TAVR, sleep apnea, HIV, chronic respiratory failure, cirrhosis, DM2, hyperlipidemia, CAD, HTN. Patient reports concerns of weight loss since starting ofev ; he is down 12.3# over the last year  (6/72024 office visit, 91.6kg); he is down and additional 2.2# since starting intensive cardiac rehab. He continues Ofev  at this time. He is currently drinking 1 Boost per day (240kcals, 10g protein). Will continue to discuss strategies for weight gain including calorie density, nutrition supplements, eating frequency, etc.  A1c is at goal <7%. LDL/HDL are at goal. Per documentation, patient has been inconsistent with CPAP and O2 use. Patient will benefit from participation in intensive cardiac rehab for nutrition, exercise, and lifestyle modification. Goals not met. Attendance remains variable. Tyler Hendricks has medical history of pulmonary fibrosis, s/p TAVR, sleep apnea, HIV, chronic respiratory failure, cirrhosis, DM2, hyperlipidemia, CAD, HTN. He does not attend the group education component of cardiac rehab. Patient reports concerns of weight loss since starting ofev ; he is down 12.3# over the last year (6/72024 office visit, 91.6kg); he is down and additional 9.9# since starting intensive cardiac rehab. He continues Ofev  at this time. He is currently drinking 1 Boost per day (240kcals, 10g protein). Have discussed strategies for weight gain including nutrition supplements, eating frequency, and calorie denisity. A1c is at goal <7%. LDL/HDL are at goal. Per documentation, patient has been inconsistent with CPAP and O2 use. Patient will benefit from participation in intensive cardiac rehab for nutrition, exercise, and lifestyle modification.         Nutrition Goals Discharge (Final Nutrition Goals Re-Evaluation):  Nutrition Goals Re-Evaluation - 09/27/23 1615       Goals   Current Weight 186 lb 8.2 oz (84.6 kg)    Comment A1c 6.8, LDL 58, HDL 70    Expected Outcome Goals not met. Attendance remains variable. Tyler Hendricks has medical history of pulmonary fibrosis, s/p TAVR, sleep apnea, HIV, chronic respiratory failure, cirrhosis, DM2, hyperlipidemia, CAD, HTN. He does not attend the group education component of cardiac  rehab. Patient reports concerns of weight loss since starting ofev ; he is down 12.3# over the last year (6/72024 office visit, 91.6kg); he is down and additional 9.9# since starting intensive cardiac rehab. He continues Ofev  at this time. He is currently drinking 1 Boost per day (240kcals, 10g protein). Have discussed strategies for weight gain including nutrition supplements, eating frequency, and calorie denisity. A1c is at goal <7%. LDL/HDL are at goal. Per documentation, patient has been inconsistent with CPAP and O2 use. Patient will benefit from participation in intensive cardiac rehab for nutrition, exercise, and lifestyle modification.          Psychosocial: Target Goals: Acknowledge presence or absence of significant depression and/or stress, maximize coping skills, provide positive support system. Participant is able to verbalize types and ability to use techniques and skills needed for reducing stress and depression.  Initial Review & Psychosocial Screening:   Quality of Life Scores:  Scores of 19 and below usually indicate a poorer quality of life in these areas.  A difference of  2-3 points is a clinically meaningful difference.  A difference of 2-3 points in the total score of the Quality of Life Index has been associated with significant improvement in overall quality of life, self-image, physical symptoms, and general health in studies assessing change in quality of life.  PHQ-9: Review Flowsheet  More data exists      09/22/2023 07/22/2023 07/16/2023 04/14/2023 01/01/2023  Depression screen PHQ 2/9  Decreased Interest 0 0 0 0 0  Down, Depressed, Hopeless 0 0 0 0 0  PHQ - 2 Score 0 0 0 0 0  Altered sleeping - 1 0 0 -  Tired, decreased energy - 0 0 0 -  Change in appetite - 0 0 0 -  Feeling bad or failure about yourself  - 0 0 0 -  Trouble concentrating - 0 0 0 -  Moving slowly or fidgety/restless - 0 0 0 -  Suicidal thoughts - 0  0 0 -  PHQ-9 Score - 1 0 0 -  Difficult doing  work/chores - Not difficult at all Somewhat difficult Not difficult at all -   Interpretation of Total Score  Total Score Depression Severity:  1-4 = Minimal depression, 5-9 = Mild depression, 10-14 = Moderate depression, 15-19 = Moderately severe depression, 20-27 = Severe depression   Psychosocial Evaluation and Intervention:   Psychosocial Re-Evaluation:  Psychosocial Re-Evaluation     Row Name 08/10/23 1754 09/07/23 1706 09/29/23 9187         Psychosocial Re-Evaluation   Current issues with Current Sleep Concerns Current Sleep Concerns Current Sleep Concerns     Comments Tyler Hendricks has not voiced any increased concerns or stressors during exercise at cardiac rehab. Will discuss PHQ9 and quality of life in the upcoming week PHQ9 and quality of life reviewed.Tyler Hendricks denies being depressed currently. Tyler Hendricks says that he has difficulty sleeping due to his sleep apnea and waiting for his CPAP machine to be serviced. Tyler Hendricks says he has had some improvement in his shortness of breath since he has been participating in cardiac rehab. Tyler Hendricks says he has had some improvement in his shortness of breath since he has been participating in cardiac rehab. Tyler Hendricks has not voiced any increased coincerns or stressors during exercise at cardiac rehab     Interventions Stress management education;Relaxation education;Encouraged to attend Cardiac Rehabilitation for the exercise Stress management education;Relaxation education;Encouraged to attend Cardiac Rehabilitation for the exercise Stress management education;Relaxation education;Encouraged to attend Cardiac Rehabilitation for the exercise     Continue Psychosocial Services  Follow up required by staff No Follow up required No Follow up required        Psychosocial Discharge (Final Psychosocial Re-Evaluation):  Psychosocial Re-Evaluation - 09/29/23 0812       Psychosocial Re-Evaluation   Current issues with Current Sleep Concerns    Comments Tyler Hendricks says he has had some  improvement in his shortness of breath since he has been participating in cardiac rehab. Tyler Hendricks has not voiced any increased coincerns or stressors during exercise at cardiac rehab    Interventions Stress management education;Relaxation education;Encouraged to attend Cardiac Rehabilitation for the exercise    Continue Psychosocial Services  No Follow up required          Vocational Rehabilitation: Provide vocational rehab assistance to qualifying candidates.   Vocational Rehab Evaluation & Intervention:   Education: Education Goals: Education classes will be provided on a weekly basis, covering required topics. Participant will state understanding/return demonstration of topics presented.    Education     Row Name 09/15/23 1300     Education   Cardiac Education Topics Pritikin   Customer service manager   Weekly Topic Fast Evening Meals   Instruction Review Code 1- Verbalizes Understanding   Class Start Time 1145   Class Stop Time 1225   Class Time Calculation (min) 40 min      Core Videos: Exercise    Move It!  Clinical staff conducted group or individual video education with verbal and written material and guidebook.  Patient learns the recommended Pritikin exercise program. Exercise with the goal of living a long, healthy life. Some of the health benefits of exercise include controlled diabetes, healthier blood pressure levels, improved cholesterol levels, improved heart and lung capacity, improved sleep, and better body composition. Everyone should speak with their doctor before starting or changing an exercise routine.  Biomechanical Limitations Clinical staff conducted  group or individual video education with verbal and written material and guidebook.  Patient learns how biomechanical limitations can impact exercise and how we can mitigate and possibly overcome limitations to have an impactful and balanced exercise routine.  Body  Composition Clinical staff conducted group or individual video education with verbal and written material and guidebook.  Patient learns that body composition (ratio of muscle mass to fat mass) is a key component to assessing overall fitness, rather than body weight alone. Increased fat mass, especially visceral belly fat, can put us  at increased risk for metabolic syndrome, type 2 diabetes, heart disease, and even death. It is recommended to combine diet and exercise (cardiovascular and resistance training) to improve your body composition. Seek guidance from your physician and exercise physiologist before implementing an exercise routine.  Exercise Action Plan Clinical staff conducted group or individual video education with verbal and written material and guidebook.  Patient learns the recommended strategies to achieve and enjoy long-term exercise adherence, including variety, self-motivation, self-efficacy, and positive decision making. Benefits of exercise include fitness, good health, weight management, more energy, better sleep, less stress, and overall well-being.  Medical   Heart Disease Risk Reduction Clinical staff conducted group or individual video education with verbal and written material and guidebook.  Patient learns our heart is our most vital organ as it circulates oxygen , nutrients, white blood cells, and hormones throughout the entire body, and carries waste away. Data supports a plant-based eating plan like the Pritikin Program for its effectiveness in slowing progression of and reversing heart disease. The video provides a number of recommendations to address heart disease.   Metabolic Syndrome and Belly Fat  Clinical staff conducted group or individual video education with verbal and written material and guidebook.  Patient learns what metabolic syndrome is, how it leads to heart disease, and how one can reverse it and keep it from coming back. You have metabolic syndrome if  you have 3 of the following 5 criteria: abdominal obesity, high blood pressure, high triglycerides, low HDL cholesterol, and high blood sugar.  Hypertension and Heart Disease Clinical staff conducted group or individual video education with verbal and written material and guidebook.  Patient learns that high blood pressure, or hypertension, is very common in the [place] . Hypertension is largely due to excessive salt intake, but other important risk factors include being overweight, physical inactivity, drinking too much alcohol, smoking, and not eating enough potassium from fruits and vegetables. High blood pressure is a leading risk factor for heart attack, stroke, congestive heart failure, dementia, kidney failure, and premature death. Long-term effects of excessive salt intake include stiffening of the arteries and thickening of heart muscle and organ damage. Recommendations include ways to reduce hypertension and the risk of heart disease.  Diseases of Our Time - Focusing on Diabetes Clinical staff conducted group or individual video education with verbal and written material and guidebook.  Patient learns why the best way to stop diseases of our time is prevention, through food and other lifestyle changes. Medicine (such as prescription pills and surgeries) is often only a Band-Aid on the problem, not a long-term solution. Most common diseases of our time include obesity, type 2 diabetes, hypertension, heart disease, and cancer. The Pritikin Program is recommended and has been proven to help reduce, reverse, and/or prevent the damaging effects of metabolic syndrome.  Nutrition   Overview of the Pritikin Eating Plan  Clinical staff conducted group or individual video education with verbal and written material and  guidebook.  Patient learns about the Pritikin Eating Plan for disease risk reduction. The Pritikin Eating Plan emphasizes a wide variety of unrefined, minimally-processed  carbohydrates, like fruits, vegetables, whole grains, and legumes. Go, Caution, and Stop food choices are explained. Plant-based and lean animal proteins are emphasized. Rationale provided for low sodium intake for blood pressure control, low added sugars for blood sugar stabilization, and low added fats and oils for coronary artery disease risk reduction and weight management.  Calorie Density  Clinical staff conducted group or individual video education with verbal and written material and guidebook.  Patient learns about calorie density and how it impacts the Pritikin Eating Plan. Knowing the characteristics of the food you choose will help you decide whether those foods will lead to weight gain or weight loss, and whether you want to consume more or less of them. Weight loss is usually a side effect of the Pritikin Eating Plan because of its focus on low calorie-dense foods.  Label Reading  Clinical staff conducted group or individual video education with verbal and written material and guidebook.  Patient learns about the Pritikin recommended label reading guidelines and corresponding recommendations regarding calorie density, added sugars, sodium content, and whole grains.  Dining Out - Part 1  Clinical staff conducted group or individual video education with verbal and written material and guidebook.  Patient learns that restaurant meals can be sabotaging because they can be so high in calories, fat, sodium, and/or sugar. Patient learns recommended strategies on how to positively address this and avoid unhealthy pitfalls.  Facts on Fats  Clinical staff conducted group or individual video education with verbal and written material and guidebook.  Patient learns that lifestyle modifications can be just as effective, if not more so, as many medications for lowering your risk of heart disease. A Pritikin lifestyle can help to reduce your risk of inflammation and atherosclerosis (cholesterol  build-up, or plaque, in the artery walls). Lifestyle interventions such as dietary choices and physical activity address the cause of atherosclerosis. A review of the types of fats and their impact on blood cholesterol levels, along with dietary recommendations to reduce fat intake is also included.  Nutrition Action Plan  Clinical staff conducted group or individual video education with verbal and written material and guidebook.  Patient learns how to incorporate Pritikin recommendations into their lifestyle. Recommendations include planning and keeping personal health goals in mind as an important part of their success.  Healthy Mind-Set    Healthy Minds, Bodies, Hearts  Clinical staff conducted group or individual video education with verbal and written material and guidebook.  Patient learns how to identify when they are stressed. Video will discuss the impact of that stress, as well as the many benefits of stress management. Patient will also be introduced to stress management techniques. The way we think, act, and feel has an impact on our hearts.  How Our Thoughts Can Heal Our Hearts  Clinical staff conducted group or individual video education with verbal and written material and guidebook.  Patient learns that negative thoughts can cause depression and anxiety. This can result in negative lifestyle behavior and serious health problems. Cognitive behavioral therapy is an effective method to help control our thoughts in order to change and improve our emotional outlook.  Additional Videos:  Exercise    Improving Performance  Clinical staff conducted group or individual video education with verbal and written material and guidebook.  Patient learns to use a non-linear approach by alternating intensity levels  and lengths of time spent exercising to help burn more calories and lose more body fat. Cardiovascular exercise helps improve heart health, metabolism, hormonal balance, blood sugar  control, and recovery from fatigue. Resistance training improves strength, endurance, balance, coordination, reaction time, metabolism, and muscle mass. Flexibility exercise improves circulation, posture, and balance. Seek guidance from your physician and exercise physiologist before implementing an exercise routine and learn your capabilities and proper form for all exercise.  Introduction to Yoga  Clinical staff conducted group or individual video education with verbal and written material and guidebook.  Patient learns about yoga, a discipline of the coming together of mind, breath, and body. The benefits of yoga include improved flexibility, improved range of motion, better posture and core strength, increased lung function, weight loss, and positive self-image. Yoga's heart health benefits include lowered blood pressure, healthier heart rate, decreased cholesterol and triglyceride levels, improved immune function, and reduced stress. Seek guidance from your physician and exercise physiologist before implementing an exercise routine and learn your capabilities and proper form for all exercise.  Medical   Aging: Enhancing Your Quality of Life  Clinical staff conducted group or individual video education with verbal and written material and guidebook.  Patient learns key strategies and recommendations to stay in good physical health and enhance quality of life, such as prevention strategies, having an advocate, securing a Health Care Proxy and Power of Attorney, and keeping a list of medications and system for tracking them. It also discusses how to avoid risk for bone loss.  Biology of Weight Control  Clinical staff conducted group or individual video education with verbal and written material and guidebook.  Patient learns that weight gain occurs because we consume more calories than we burn (eating more, moving less). Even if your body weight is normal, you may have higher ratios of fat compared to  muscle mass. Too much body fat puts you at increased risk for cardiovascular disease, heart attack, stroke, type 2 diabetes, and obesity-related cancers. In addition to exercise, following the Pritikin Eating Plan can help reduce your risk.  Decoding Lab Results  Clinical staff conducted group or individual video education with verbal and written material and guidebook.  Patient learns that lab test reflects one measurement whose values change over time and are influenced by many factors, including medication, stress, sleep, exercise, food, hydration, pre-existing medical conditions, and more. It is recommended to use the knowledge from this video to become more involved with your lab results and evaluate your numbers to speak with your doctor.   Diseases of Our Time - Overview  Clinical staff conducted group or individual video education with verbal and written material and guidebook.  Patient learns that according to the CDC, 50% to 70% of chronic diseases (such as obesity, type 2 diabetes, elevated lipids, hypertension, and heart disease) are avoidable through lifestyle improvements including healthier food choices, listening to satiety cues, and increased physical activity.  Sleep Disorders Clinical staff conducted group or individual video education with verbal and written material and guidebook.  Patient learns how good quality and duration of sleep are important to overall health and well-being. Patient also learns about sleep disorders and how they impact health along with recommendations to address them, including discussing with a physician.  Nutrition  Dining Out - Part 2 Clinical staff conducted group or individual video education with verbal and written material and guidebook.  Patient learns how to plan ahead and communicate in order to maximize their dining experience in a  healthy and nutritious manner. Included are recommended food choices based on the type of restaurant the patient  is visiting.   Fueling a Banker conducted group or individual video education with verbal and written material and guidebook.  There is a strong connection between our food choices and our health. Diseases like obesity and type 2 diabetes are very prevalent and are in large-part due to lifestyle choices. The Pritikin Eating Plan provides plenty of food and hunger-curbing satisfaction. It is easy to follow, affordable, and helps reduce health risks.  Menu Workshop  Clinical staff conducted group or individual video education with verbal and written material and guidebook.  Patient learns that restaurant meals can sabotage health goals because they are often packed with calories, fat, sodium, and sugar. Recommendations include strategies to plan ahead and to communicate with the manager, chef, or server to help order a healthier meal.  Planning Your Eating Strategy  Clinical staff conducted group or individual video education with verbal and written material and guidebook.  Patient learns about the Pritikin Eating Plan and its benefit of reducing the risk of disease. The Pritikin Eating Plan does not focus on calories. Instead, it emphasizes high-quality, nutrient-rich foods. By knowing the characteristics of the foods, we choose, we can determine their calorie density and make informed decisions.  Targeting Your Nutrition Priorities  Clinical staff conducted group or individual video education with verbal and written material and guidebook.  Patient learns that lifestyle habits have a tremendous impact on disease risk and progression. This video provides eating and physical activity recommendations based on your personal health goals, such as reducing LDL cholesterol, losing weight, preventing or controlling type 2 diabetes, and reducing high blood pressure.  Vitamins and Minerals  Clinical staff conducted group or individual video education with verbal and written material  and guidebook.  Patient learns different ways to obtain key vitamins and minerals, including through a recommended healthy diet. It is important to discuss all supplements you take with your doctor.   Healthy Mind-Set    Smoking Cessation  Clinical staff conducted group or individual video education with verbal and written material and guidebook.  Patient learns that cigarette smoking and tobacco addiction pose a serious health risk which affects millions of people. Stopping smoking will significantly reduce the risk of heart disease, lung disease, and many forms of cancer. Recommended strategies for quitting are covered, including working with your doctor to develop a successful plan.  Culinary   Becoming a Set designer conducted group or individual video education with verbal and written material and guidebook.  Patient learns that cooking at home can be healthy, cost-effective, quick, and puts them in control. Keys to cooking healthy recipes will include looking at your recipe, assessing your equipment needs, planning ahead, making it simple, choosing cost-effective seasonal ingredients, and limiting the use of added fats, salts, and sugars.  Cooking - Breakfast and Snacks  Clinical staff conducted group or individual video education with verbal and written material and guidebook.  Patient learns how important breakfast is to satiety and nutrition through the entire day. Recommendations include key foods to eat during breakfast to help stabilize blood sugar levels and to prevent overeating at meals later in the day. Planning ahead is also a key component.  Cooking - Educational psychologist conducted group or individual video education with verbal and written material and guidebook.  Patient learns eating strategies to improve overall health, including an approach  to cook more at home. Recommendations include thinking of animal protein as a side on your plate rather  than center stage and focusing instead on lower calorie dense options like vegetables, fruits, whole grains, and plant-based proteins, such as beans. Making sauces in large quantities to freeze for later and leaving the skin on your vegetables are also recommended to maximize your experience.  Cooking - Healthy Salads and Dressing Clinical staff conducted group or individual video education with verbal and written material and guidebook.  Patient learns that vegetables, fruits, whole grains, and legumes are the foundations of the Pritikin Eating Plan. Recommendations include how to incorporate each of these in flavorful and healthy salads, and how to create homemade salad dressings. Proper handling of ingredients is also covered. Cooking - Soups and [employer] - Soups and Desserts Clinical staff conducted group or individual video education with verbal and written material and guidebook.  Patient learns that Pritikin soups and desserts make for easy, nutritious, and delicious snacks and meal components that are low in sodium, fat, sugar, and calorie density, while high in vitamins, minerals, and filling fiber. Recommendations include simple and healthy ideas for soups and desserts.   Overview     The Pritikin Solution Program Overview Clinical staff conducted group or individual video education with verbal and written material and guidebook.  Patient learns that the results of the Pritikin Program have been documented in more than 100 articles published in peer-reviewed journals, and the benefits include reducing risk factors for (and, in some cases, even reversing) high cholesterol, high blood pressure, type 2 diabetes, obesity, and more! An overview of the three key pillars of the Pritikin Program will be covered: eating well, doing regular exercise, and having a healthy mind-set.  WORKSHOPS  Exercise: Exercise Basics: Building Your Action Plan Clinical staff led group instruction and  group discussion with PowerPoint presentation and patient guidebook. To enhance the learning environment the use of posters, models and videos may be added. At the conclusion of this workshop, patients will comprehend the difference between physical activity and exercise, as well as the benefits of incorporating both, into their routine. Patients will understand the FITT (Frequency, Intensity, Time, and Type) principle and how to use it to build an exercise action plan. In addition, safety concerns and other considerations for exercise and cardiac rehab will be addressed by the presenter. The purpose of this lesson is to promote a comprehensive and effective weekly exercise routine in order to improve patients' overall level of fitness.   Managing Heart Disease: Your Path to a Healthier Heart Clinical staff led group instruction and group discussion with PowerPoint presentation and patient guidebook. To enhance the learning environment the use of posters, models and videos may be added.At the conclusion of this workshop, patients will understand the anatomy and physiology of the heart. Additionally, they will understand how Pritikin's three pillars impact the risk factors, the progression, and the management of heart disease.  The purpose of this lesson is to provide a high-level overview of the heart, heart disease, and how the Pritikin lifestyle positively impacts risk factors.  Exercise Biomechanics Clinical staff led group instruction and group discussion with PowerPoint presentation and patient guidebook. To enhance the learning environment the use of posters, models and videos may be added. Patients will learn how the structural parts of their bodies function and how these functions impact their daily activities, movement, and exercise. Patients will learn how to promote a neutral spine, learn how to manage  pain, and identify ways to improve their physical movement in order to  promote healthy living. The purpose of this lesson is to expose patients to common physical limitations that impact physical activity. Participants will learn practical ways to adapt and manage aches and pains, and to minimize their effect on regular exercise. Patients will learn how to maintain good posture while sitting, walking, and lifting.  Balance Training and Fall Prevention  Clinical staff led group instruction and group discussion with PowerPoint presentation and patient guidebook. To enhance the learning environment the use of posters, models and videos may be added. At the conclusion of this workshop, patients will understand the importance of their sensorimotor skills (vision, proprioception, and the vestibular system) in maintaining their ability to balance as they age. Patients will apply a variety of balancing exercises that are appropriate for their current level of function. Patients will understand the common causes for poor balance, possible solutions to these problems, and ways to modify their physical environment in order to minimize their fall risk. The purpose of this lesson is to teach patients about the importance of maintaining balance as they age and ways to minimize their risk of falling.  WORKSHOPS   Nutrition:  Fueling a Ship broker led group instruction and group discussion with PowerPoint presentation and patient guidebook. To enhance the learning environment the use of posters, models and videos may be added. Patients will review the foundational principles of the Pritikin Eating Plan and understand what constitutes a serving size in each of the food groups. Patients will also learn Pritikin-friendly foods that are better choices when away from home and review make-ahead meal and snack options. Calorie density will be reviewed and applied to three nutrition priorities: weight maintenance, weight loss, and weight gain. The purpose of this lesson is to  reinforce (in a group setting) the key concepts around what patients are recommended to eat and how to apply these guidelines when away from home by planning and selecting Pritikin-friendly options. Patients will understand how calorie density may be adjusted for different weight management goals.  Mindful Eating  Clinical staff led group instruction and group discussion with PowerPoint presentation and patient guidebook. To enhance the learning environment the use of posters, models and videos may be added. Patients will briefly review the concepts of the Pritikin Eating Plan and the importance of low-calorie dense foods. The concept of mindful eating will be introduced as well as the importance of paying attention to internal hunger signals. Triggers for non-hunger eating and techniques for dealing with triggers will be explored. The purpose of this lesson is to provide patients with the opportunity to review the basic principles of the Pritikin Eating Plan, discuss the value of eating mindfully and how to measure internal cues of hunger and fullness using the Hunger Scale. Patients will also discuss reasons for non-hunger eating and learn strategies to use for controlling emotional eating.  Targeting Your Nutrition Priorities Clinical staff led group instruction and group discussion with PowerPoint presentation and patient guidebook. To enhance the learning environment the use of posters, models and videos may be added. Patients will learn how to determine their genetic susceptibility to disease by reviewing their family history. Patients will gain insight into the importance of diet as part of an overall healthy lifestyle in mitigating the impact of genetics and other environmental insults. The purpose of this lesson is to provide patients with the opportunity to assess their personal nutrition priorities by looking at their family  history, their own health history and current risk factors. Patients will  also be able to discuss ways of prioritizing and modifying the Pritikin Eating Plan for their highest risk areas  Menu  Clinical staff led group instruction and group discussion with PowerPoint presentation and patient guidebook. To enhance the learning environment the use of posters, models and videos may be added. Using menus brought in from [employer], or printed from [employer], patients will apply the Pritikin dining out guidelines that were presented in the [employer] video. Patients will also be able to practice these guidelines in a variety of provided scenarios. The purpose of this lesson is to provide patients with the opportunity to practice hands-on learning of the Pritikin Dining Out guidelines with actual menus and practice scenarios.  Label Reading Clinical staff led group instruction and group discussion with PowerPoint presentation and patient guidebook. To enhance the learning environment the use of posters, models and videos may be added. Patients will review and discuss the Pritikin label reading guidelines presented in Pritikin's Label Reading Educational series video. Using fool labels brought in from local grocery stores and markets, patients will apply the label reading guidelines and determine if the packaged food meet the Pritikin guidelines. The purpose of this lesson is to provide patients with the opportunity to review, discuss, and practice hands-on learning of the Pritikin Label Reading guidelines with actual packaged food labels. Cooking School  Pritikin's [employer] are designed to teach patients ways to prepare quick, simple, and affordable recipes at home. The importance of nutrition's role in chronic disease risk reduction is reflected in its emphasis in the overall Pritikin program. By learning how to prepare essential core Pritikin Eating Plan recipes, patients will increase control over what they eat; be able to customize the  flavor of foods without the use of added salt, sugar, or fat; and improve the quality of the food they consume. By learning a set of core recipes which are easily assembled, quickly prepared, and affordable, patients are more likely to prepare more healthy foods at home. These workshops focus on convenient breakfasts, simple entres, side dishes, and desserts which can be prepared with minimal effort and are consistent with nutrition recommendations for cardiovascular risk reduction. Cooking [employer] are taught by a Armed forces logistics/support/administrative officer (RD) who has been trained by the [employer]. The chef or RD has a clear understanding of the importance of minimizing - if not completely eliminating - added fat, sugar, and sodium in recipes. Throughout the series of Cooking School Workshop sessions, patients will learn about healthy ingredients and efficient methods of cooking to build confidence in their capability to prepare    Cooking School weekly topics:  Adding Flavor- Sodium-Free  Fast and Healthy Breakfasts  Powerhouse Plant-Based Proteins  Satisfying Salads and Dressings  Simple Sides and Sauces  International Cuisine-Spotlight on the [employer] Zones  Delicious Desserts  Savory Soups  [employer] - Meals in a Astronomer Appetizers and Snacks  Comforting Weekend Breakfasts  One-Pot Wonders   Fast Evening Meals  Landscape architect Your Pritikin Plate  WORKSHOPS   Healthy Mindset (Psychosocial):  Focused Goals, Sustainable Changes Clinical staff led group instruction and group discussion with PowerPoint presentation and patient guidebook. To enhance the learning environment the use of posters, models and videos may be added. Patients will be able to apply effective goal setting strategies to establish at least one personal goal, and then take consistent,  meaningful action toward that goal. They will learn to identify common barriers to achieving  personal goals and develop strategies to overcome them. Patients will also gain an understanding of how our mind-set can impact our ability to achieve goals and the importance of cultivating a positive and growth-oriented mind-set. The purpose of this lesson is to provide patients with a deeper understanding of how to set and achieve personal goals, as well as the tools and strategies needed to overcome common obstacles which may arise along the way.  From Head to Heart: The Power of a Healthy Outlook  Clinical staff led group instruction and group discussion with PowerPoint presentation and patient guidebook. To enhance the learning environment the use of posters, models and videos may be added. Patients will be able to recognize and describe the impact of emotions and mood on physical health. They will discover the importance of self-care and explore self-care practices which may work for them. Patients will also learn how to utilize the 4 C's to cultivate a healthier outlook and better manage stress and challenges. The purpose of this lesson is to demonstrate to patients how a healthy outlook is an essential part of maintaining good health, especially as they continue their cardiac rehab journey.  Healthy Sleep for a Healthy Heart Clinical staff led group instruction and group discussion with PowerPoint presentation and patient guidebook. To enhance the learning environment the use of posters, models and videos may be added. At the conclusion of this workshop, patients will be able to demonstrate knowledge of the importance of sleep to overall health, well-being, and quality of life. They will understand the symptoms of, and treatments for, common sleep disorders. Patients will also be able to identify daytime and nighttime behaviors which impact sleep, and they will be able to apply these tools to help manage sleep-related challenges. The purpose of this lesson is to provide patients with a general  overview of sleep and outline the importance of quality sleep. Patients will learn about a few of the most common sleep disorders. Patients will also be introduced to the concept of "sleep hygiene," and discover ways to self-manage certain sleeping problems through simple daily behavior changes. Finally, the workshop will motivate patients by clarifying the links between quality sleep and their goals of heart-healthy living.   Recognizing and Reducing Stress Clinical staff led group instruction and group discussion with PowerPoint presentation and patient guidebook. To enhance the learning environment the use of posters, models and videos may be added. At the conclusion of this workshop, patients will be able to understand the types of stress reactions, differentiate between acute and chronic stress, and recognize the impact that chronic stress has on their health. They will also be able to apply different coping mechanisms, such as reframing negative self-talk. Patients will have the opportunity to practice a variety of stress management techniques, such as deep abdominal breathing, progressive muscle relaxation, and/or guided imagery.  The purpose of this lesson is to educate patients on the role of stress in their lives and to provide healthy techniques for coping with it.  Learning Barriers/Preferences:   Education Topics:  Knowledge Questionnaire Score:   Core Components/Risk Factors/Patient Goals at Admission:   Core Components/Risk Factors/Patient Goals Review:   Goals and Risk Factor Review     Row Name 08/10/23 1755 09/07/23 1708 09/29/23 0815         Core Components/Risk Factors/Patient Goals Review   Personal Goals Review Weight Management/Obesity;Lipids;Hypertension;Diabetes;Improve shortness of breath with ADL's Weight  Management/Obesity;Lipids;Hypertension;Diabetes;Improve shortness of breath with ADL's Weight Management/Obesity;Lipids;Hypertension;Diabetes;Improve shortness of  breath with ADL's     Review Tyler Hendricks started cardiac rehab on 07/25/23. Vital signs have been stable. Weight down 0.9 kg.  Oxygen  saturations stable on 2l/min Tyler Hendricks is doing well with cardiac rehab when in attendance.  Vital signs have been stable. Weight is now  unchnaged from orientation weight as Tyler Hendricks is on OFEV .  Oxygen  saturations stable on 2l/min Tyler Hendricks is doing well with cardiac rehab when in attendance.  Vital signs have been stable. Weight was down 4,5 kg on last day of attendance 09/15/23. Oxygen  saturations stable on 3l/min     Expected Outcomes Tyler Hendricks will continue to participate in cardiac rehab for exercise, nutrition and lifestyle modificaitons Tyler Hendricks will continue to participate in cardiac rehab for exercise, nutrition and lifestyle modificaitons Tyler Hendricks will continue to participate in cardiac rehab for exercise, nutrition and lifestyle modificaitons        Core Components/Risk Factors/Patient Goals at Discharge (Final Review):   Goals and Risk Factor Review - 09/29/23 0815       Core Components/Risk Factors/Patient Goals Review   Personal Goals Review Weight Management/Obesity;Lipids;Hypertension;Diabetes;Improve shortness of breath with ADL's    Review Tyler Hendricks is doing well with cardiac rehab when in attendance.  Vital signs have been stable. Weight was down 4,5 kg on last day of attendance 09/15/23. Oxygen  saturations stable on 3l/min    Expected Outcomes Tyler Hendricks will continue to participate in cardiac rehab for exercise, nutrition and lifestyle modificaitons          ITP Comments:  ITP Comments     Row Name 08/10/23 1753 09/07/23 1700 09/29/23 0806       ITP Comments 30 Day ITP Review. Senai started cardiac rehab on 07/27/23. Treyveon is off to a good start with exercise. 30 Day ITP Review. Tyler Hendricks has good participation  in cardiac rehab when in attendance. 30 Day ITP Review. Tyler Hendricks's attendance at cardiac rehab has been poor. Tyler Hendricks's last day of Attendance was on 09/15/23        Comments:  See ITP comments.Hadassah Elpidio Quan RN BSN

## 2023-10-01 ENCOUNTER — Encounter (HOSPITAL_COMMUNITY)

## 2023-10-01 ENCOUNTER — Encounter (HOSPITAL_COMMUNITY): Admission: RE | Admit: 2023-10-01 | Source: Ambulatory Visit | Attending: Cardiology

## 2023-10-04 ENCOUNTER — Telehealth (HOSPITAL_COMMUNITY): Payer: Self-pay

## 2023-10-04 ENCOUNTER — Encounter (HOSPITAL_COMMUNITY)

## 2023-10-04 ENCOUNTER — Encounter (HOSPITAL_COMMUNITY): Admission: RE | Admit: 2023-10-04 | Source: Ambulatory Visit | Attending: Cardiology | Admitting: Cardiology

## 2023-10-04 ENCOUNTER — Other Ambulatory Visit (HOSPITAL_COMMUNITY): Payer: Self-pay

## 2023-10-04 DIAGNOSIS — D0439 Carcinoma in situ of skin of other parts of face: Secondary | ICD-10-CM | POA: Diagnosis not present

## 2023-10-04 DIAGNOSIS — D492 Neoplasm of unspecified behavior of bone, soft tissue, and skin: Secondary | ICD-10-CM | POA: Diagnosis not present

## 2023-10-05 ENCOUNTER — Other Ambulatory Visit (HOSPITAL_COMMUNITY): Payer: Self-pay

## 2023-10-05 ENCOUNTER — Other Ambulatory Visit: Payer: Self-pay

## 2023-10-05 ENCOUNTER — Encounter (INDEPENDENT_AMBULATORY_CARE_PROVIDER_SITE_OTHER): Payer: Self-pay

## 2023-10-05 NOTE — Progress Notes (Signed)
 Specialty Pharmacy Refill Coordination Note  MyChart Questionnaire Submission  Hence Derrick is a 73 y.o. male contacted today regarding refills of specialty medication(s) Biktarvy .  Patient requested: (Patient-Rptd) Delivery   Delivery date: 10/07/23   Verified address: (Patient-Rptd) [address] [place] [address]  Medication will be filled on 10/06/23.

## 2023-10-06 ENCOUNTER — Encounter (HOSPITAL_COMMUNITY)

## 2023-10-06 ENCOUNTER — Encounter (HOSPITAL_COMMUNITY): Admission: RE | Admit: 2023-10-06 | Source: Ambulatory Visit | Attending: Cardiology

## 2023-10-08 ENCOUNTER — Encounter (HOSPITAL_COMMUNITY): Payer: Self-pay | Admitting: *Deleted

## 2023-10-08 ENCOUNTER — Telehealth (HOSPITAL_COMMUNITY): Payer: Self-pay | Admitting: *Deleted

## 2023-10-08 ENCOUNTER — Encounter (HOSPITAL_COMMUNITY)

## 2023-10-08 ENCOUNTER — Encounter (HOSPITAL_COMMUNITY): Admission: RE | Admit: 2023-10-08 | Source: Ambulatory Visit | Attending: Cardiology | Admitting: Cardiology

## 2023-10-08 DIAGNOSIS — Z952 Presence of prosthetic heart valve: Secondary | ICD-10-CM

## 2023-10-08 NOTE — Telephone Encounter (Signed)
 Left message to call cardiac rehab. Will discharge from the program at this time due to nonattendance. Last day of exercise was on 09/15/23.Hadassah Elpidio Quan RN BSN

## 2023-10-08 NOTE — Progress Notes (Cosign Needed Addendum)
 Discharge Progress Report  Patient Details  Name: Tyler Hendricks MRN: [id number] Date of Birth: 10/22/1950 Referring Provider:   Flowsheet Row INTENSIVE CARDIAC REHAB ORIENT from 07/22/2023 in [facility]  Referring Provider Newman Lawrence, MD     Number of Visits: 11  Reason for Discharge:  Patient reached a stable level of exercise. Patient independent in their exercise. Patient has met program and personal goals.  Smoking History:  Social History   Tobacco Use  Smoking Status Former   Current packs/day: 0.00   Average packs/day: 2.0 packs/day for 20.0 years (40.0 ttl pk-yrs)   Types: Cigarettes   Start date: 34   Quit date: 1994   Years since quitting: 31.5   Passive exposure: Past  Smokeless Tobacco Never    Diagnosis:  06/08/23 S/P TAVR (transcatheter aortic valve replacement)  ADL [facility]:   Initial Exercise Prescription:   Discharge Exercise Prescription (Final Exercise Prescription Changes):  Exercise Prescription Changes - 09/01/23 1400       Response to Exercise   Blood Pressure (Admit) 110/64    Blood Pressure (Exercise) 130/54    Blood Pressure (Exit) 120/64    Heart Rate (Admit) 67 bpm    Heart Rate (Exercise) 91 bpm    Heart Rate (Exit) 61 bpm    Oxygen  Saturation (Admit) 96 %    Oxygen  Saturation (Exercise) 93 %    Rating of Perceived Exertion (Exercise) 11    Perceived Dyspnea (Exercise) 0    Symptoms None    Comments Reviewed METs and goals    Duration Continue with 30 min of aerobic exercise without signs/symptoms of physical distress.    Intensity THRR unchanged      Progression   Progression Continue to progress workloads to maintain intensity without signs/symptoms of physical distress.    Average METs 2.55      Resistance Training   Training Prescription No      Oxygen    Oxygen  Continuous    Liters 3      Bike   Level 3    Minutes 15    METs 2.68      NuStep    Level 3    SPM 83    Minutes 15    METs 2.4          Functional Capacity:   Psychological, QOL, Others - Outcomes: PHQ 2/9:    09/22/2023    9:44 AM 07/22/2023    1:56 PM 07/16/2023   10:40 AM 04/14/2023   10:36 AM 01/01/2023    9:30 AM  Depression screen PHQ 2/9  Decreased Interest 0 0 0 0 0  Down, Depressed, Hopeless 0 0 0 0 0  PHQ - 2 Score 0 0 0 0 0  Altered sleeping  1 0 0   Tired, decreased energy  0 0 0   Change in appetite  0 0 0   Feeling bad or failure about yourself   0 0 0   Trouble concentrating  0 0 0   Moving slowly or fidgety/restless  0 0 0   Suicidal thoughts  0 0 0   PHQ-9 Score  1 0 0   Difficult doing work/chores  Not difficult at all Somewhat difficult Not difficult at all     Quality of Life:   Personal Goals: Goals established at orientation with interventions provided to work toward goal.    Personal Goals Discharge:  Goals and Risk Factor Review  Row Name 09/07/23 1708 09/29/23 0815           Core Components/Risk Factors/Patient Goals Review   Personal Goals Review Weight Management/Obesity;Lipids;Hypertension;Diabetes;Improve shortness of breath with ADL's Weight Management/Obesity;Lipids;Hypertension;Diabetes;Improve shortness of breath with ADL's      Review Tyler Hendricks is doing well with cardiac rehab when in attendance.  Vital signs have been stable. Weight is now  unchnaged from orientation weight as Tyler Hendricks is on OFEV .  Oxygen  saturations stable on 2l/min Tyler Hendricks is doing well with cardiac rehab when in attendance.  Vital signs have been stable. Weight was down 4,5 kg on last day of attendance 09/15/23. Oxygen  saturations stable on 3l/min      Expected Outcomes Tyler Hendricks will continue to participate in cardiac rehab for exercise, nutrition and lifestyle modificaitons Tyler Hendricks will continue to participate in cardiac rehab for exercise, nutrition and lifestyle modificaitons         Exercise Goals and Review:   Exercise Goals Re-Evaluation:   Exercise Goals Re-Evaluation     Row Name 08/13/23 1425 09/01/23 1440           Exercise Goal Re-Evaluation   Exercise Goals Review Increase Physical Activity;Able to understand and use Dyspnea scale;Understanding of Exercise Prescription;Increase Strength and Stamina;Knowledge and understanding of Target Heart Rate Range (THRR);Able to understand and use rate of perceived exertion (RPE) scale Increase Physical Activity;Able to understand and use Dyspnea scale;Understanding of Exercise Prescription;Increase Strength and Stamina;Knowledge and understanding of Target Heart Rate Range (THRR);Able to understand and use rate of perceived exertion (RPE) scale      Comments Reviewed METs and Goals with pt. METs have increased since starting the program. Pt is still progressing towards his goals. Provided strategies that will help increase his stamina and strength. Reviewed METs and goals. Pt has been absent from the program due to family issues. Pt reports some progress on his goal of increased strength and stamina. Pt voices ne sees some impeovement with his breathing, especailly like when walking his dog.      Expected Outcomes Will continue to monitor the patient and progress exercise workloads as tolerated. Will continue to monitor the patient and progress exercise workloads as tolerated.         Nutrition & Weight - Outcomes:    Nutrition:  Nutrition Therapy & Goals - 09/27/23 1615       Nutrition Therapy   Diet Heart Healthy Diet    Drug/Food Interactions Statins/Certain Fruits      Personal Nutrition Goals   Nutrition Goal Patient to identify strategies for reducing cardiovascular risk by attending the Pritikin education and nutrition series weekly.   goal not met.   Personal Goal #2 Patient to improve diet quality by using the plate method as a guide for meal planning to include lean protein/plant protein, fruits, vegetables, whole grains, nonfat dairy as part of a well-balanced diet.    goal not met.   Personal Goal #3 Patient to maintain weight/ identify strategies for weight gain as needed of 0.5-2.0# per week.   goals not met.   Comments Goals not met. Attendance remains variable. Tyler Hendricks has medical history of pulmonary fibrosis, s/p TAVR, sleep apnea, HIV, chronic respiratory failure, cirrhosis, DM2, hyperlipidemia, CAD, HTN. He does not attend the group education component of cardiac rehab. Patient reports concerns of weight loss since starting ofev ; he is down 12.3# over the last year (6/72024 office visit, 91.6kg); he is down and additional 9.9# since starting intensive cardiac rehab. He continues Ofev  at this time.  He is currently drinking 1 Boost per day (240kcals, 10g protein). Have discussed strategies for weight gain including nutrition supplements, eating frequency, and calorie denisity. A1c is at goal <7%. LDL/HDL are at goal. Per documentation, patient has been inconsistent with CPAP and O2 use. Patient will benefit from participation in intensive cardiac rehab for nutrition, exercise, and lifestyle modification.      Intervention Plan   Intervention Prescribe, educate and counsel regarding individualized specific dietary modifications aiming towards targeted core components such as weight, hypertension, lipid management, diabetes, heart failure and other comorbidities.;Nutrition handout(s) given to patient.    Expected Outcomes Short Term Goal: Understand basic principles of dietary content, such as calories, fat, sodium, cholesterol and nutrients.;Long Term Goal: Adherence to prescribed nutrition plan.          Nutrition Discharge:   Education Questionnaire Score:   Tyler Hendricks completed 11 exercise and education classes between 07/22/23-09/15/23. Tyler Hendricks did well with exercise when in attendance. Tyler Hendricks was discharged due to nonattendance.Hadassah Elpidio Quan RN BSN

## 2023-10-11 ENCOUNTER — Encounter (HOSPITAL_COMMUNITY)

## 2023-10-13 ENCOUNTER — Other Ambulatory Visit: Payer: Self-pay | Admitting: Pulmonary Disease

## 2023-10-13 ENCOUNTER — Other Ambulatory Visit: Payer: Self-pay | Admitting: Internal Medicine

## 2023-10-13 ENCOUNTER — Encounter (HOSPITAL_COMMUNITY)

## 2023-10-13 DIAGNOSIS — R053 Chronic cough: Secondary | ICD-10-CM

## 2023-10-13 NOTE — Telephone Encounter (Signed)
Please advise controlled substance refill request.

## 2023-10-13 NOTE — Telephone Encounter (Unsigned)
 Copied from CRM [id number]. Topic: Clinical - Medication Refill >> Oct 13, 2023  8:20 AM Leila BROCKS wrote: Most Recent Pulmonary Care Visit:  Provider: Dr. Mannam, Praveen lov 04/05/23 and NP, Tammy Parrett lov 07/20/23 Department: [facility]  Medication(s): HYDROcodone  bit-homatropine (HYCODAN ) 5-1.5 MG/5ML syrup   Has the patient contacted their pharmacy? Yes (Agent: If no, request that the patient contact the pharmacy for the refill. If patient does not wish to contact the pharmacy document the reason why and proceed with request.) (Agent: If yes, when and what did the pharmacy advise?)  This is the patient's preferred pharmacy:  [id number] [place], [place] - [address] AT [facility] [address] [facility] [address] [place] [address] Phone: [phone] Fax: [phone]  Is this the correct pharmacy for this prescription? Yes If no, delete pharmacy and type the correct one.   Has the prescription been filled recently? No  Is the patient out of the medication? Yes  Has the patient been seen for an appointment in the last year OR does the patient have an upcoming appointment? Yes, 10/18/23 with Dr. Theophilus  Can we respond through MyChart? No, call [phone]  Agent: Please be advised that Rx refills may take up to 3 business days. We ask that you follow-up with your pharmacy.

## 2023-10-14 MED ORDER — HYDROCODONE BIT-HOMATROP MBR 5-1.5 MG/5ML PO SOLN: 5.0000 mL | Freq: Four times a day (QID) | ORAL | 0 refills | Status: DC | PRN

## 2023-10-14 NOTE — Addendum Note (Signed)
 Encounter addended by: Janann Lenis on: 10/14/2023 3:27 PM  Actions taken: Flowsheet accepted

## 2023-10-15 ENCOUNTER — Other Ambulatory Visit: Payer: Self-pay | Admitting: Internal Medicine

## 2023-10-15 DIAGNOSIS — R053 Chronic cough: Secondary | ICD-10-CM

## 2023-10-15 NOTE — Telephone Encounter (Signed)
 Copied from CRM [id number]. Topic: Clinical - Prescription Issue >> Oct 15, 2023 10:43 AM Rozanna MATSU wrote: Reason for CRM: PT CALLED [facility] FOR HYDROcodone  bit-homatropine (HYCODAN ) 5-1.5 MG/5ML syrup NOT IN STOCK AT [facility] CAN WE SEND TO [facility] ON [address] >> Oct 15, 2023 10:45 AM Rozanna G wrote: PT STATED HE IS NEEDING THIS TODAY. STATED COUGHING REALLY BAD.

## 2023-10-18 ENCOUNTER — Ambulatory Visit (INDEPENDENT_AMBULATORY_CARE_PROVIDER_SITE_OTHER): Admitting: Pulmonary Disease

## 2023-10-18 ENCOUNTER — Encounter: Payer: Self-pay | Admitting: Pulmonary Disease

## 2023-10-18 VITALS — BP 114/70 | HR 65 | Ht 72.0 in | Wt 194.0 lb

## 2023-10-18 DIAGNOSIS — Z5181 Encounter for therapeutic drug level monitoring: Secondary | ICD-10-CM | POA: Diagnosis not present

## 2023-10-18 DIAGNOSIS — J84112 Idiopathic pulmonary fibrosis: Secondary | ICD-10-CM | POA: Diagnosis not present

## 2023-10-18 LAB — COMPREHENSIVE METABOLIC PANEL WITH GFR
ALT: 22 U/L (ref 0–53)
AST: 40 U/L — ABNORMAL HIGH (ref 0–37)
Albumin: 3.1 g/dL — ABNORMAL LOW (ref 3.5–5.2)
Alkaline Phosphatase: 133 U/L — ABNORMAL HIGH (ref 39–117)
BUN: 16 mg/dL (ref 6–23)
CO2: 29 meq/L (ref 19–32)
Calcium: 9.4 mg/dL (ref 8.4–10.5)
Chloride: 104 meq/L (ref 96–112)
Creatinine, Ser: 0.68 mg/dL (ref 0.40–1.50)
GFR: 92.49 mL/min (ref >60.00)
Glucose, Bld: 177 mg/dL — ABNORMAL HIGH (ref 70–99)
Potassium: 4.3 meq/L (ref 3.5–5.1)
Sodium: 140 meq/L (ref 135–145)
Total Bilirubin: 3.1 mg/dL — ABNORMAL HIGH (ref 0.2–1.2)
Total Protein: 6.1 g/dL (ref 6.0–8.3)

## 2023-10-18 NOTE — Progress Notes (Addendum)
 Tyler Hendricks    [id number]    [birth year]  Primary Care Physician:Crawford, Almarie LABOR, MD  Referring Physician: Rollene Almarie LABOR, MD [address] [place],  [place] [address]  Problem list: Follow-up for idiopathic pulmonary fibrosis Started on Ofev  in late June 2022.   HPI: 73 y.o.  with history of coronary artery disease, HIV, hepatitis C, cirrhosis Complains of chronic cough for the past several years. He has paroxysms of cough associated with dyspnea. Mild chest congestion History notable for COVID-19 infection in December 2020. He had very mild symptoms and did not require hospitalization.  CT scan with probable UIP pattern pulmonary fibrosis. He has been referred to the ILD clinic for further evaluation.  Started on Ofev  in 2022 for diagnosis of IPF  Interim history: Discussed the use of AI scribe software for clinical note transcription with the patient, who gave verbal consent to proceed.  History of Present Illness Tyler Hendricks is a 73 year old male with pulmonary fibrosis who presents for follow-up.  Pulmonary fibrosis symptoms and functional status - Pulmonary fibrosis with ongoing symptoms managed with Ofev  100 mg twice daily - Decreased exercise tolerance; unable to walk quickly when walking his dog around the block - Discontinued pulmonary rehabilitation due to perceived lack of benefit from repetitive exercises, but maintains some physical activity  Medication side effects and adherence - Ofev  100 mg twice daily causes decreased appetite - Issues with insurance coverage for cough medication due to early refill, resulting in delay in obtaining medication  Environmental exposure concerns - Inquires about safety of cutting grass - Wears a mask to avoid dust exposure while performing yard work   Pets: Has a Nurse, mental health. No cats, birds, farm animals Occupation: Retired Psychologist, forensic Exposures: No known exposures. No mold, hot tub, Jacuzzi. No  down pillows or comforters ILD questionnaire 10/03/2019-negative Smoking history: 60-pack-year smoker. Quit in late 1990s Travel history: Originally from [place] . Moved to [place]  in 2018 Relevant family history: Mother had emphysema. She was a heavy smoker.  Outpatient Encounter Medications as of 10/18/2023  Medication Sig   amLODipine  (NORVASC ) 10 MG tablet Take 1 tablet (10 mg total) by mouth daily.   atenolol  (TENORMIN ) 50 MG tablet TAKE 1 TABLET BY MOUTH EVERY DAY   bictegravir-emtricitabine -tenofovir  AF (BIKTARVY ) 50-200-25 MG TABS tablet Take 1 tablet by mouth daily.   cholecalciferol (VITAMIN D3) 25 MCG (1000 UNIT) tablet Take 1,000 Units by mouth daily.   clopidogrel  (PLAVIX ) 75 MG tablet TAKE 1 TABLET BY MOUTH EVERY DAY   diazepam  (VALIUM ) 10 MG tablet Take 1 tablet (10 mg total) by mouth as directed. Take 1/2 a tablet 1 hour prior to MRI. If needed take other half. Please have a driver drive you to and from MRI   HYDROcodone  bit-homatropine (HYCODAN ) 5-1.5 MG/5ML syrup Take 5 mLs by mouth every 6 (six) hours as needed (chronic cough - pulmonary fibrosis).   losartan -hydrochlorothiazide  (HYZAAR ) 100-25 MG tablet Take 1 tablet by mouth daily.   Multiple Vitamin (MULTIVITAMIN) tablet Take 1 tablet by mouth daily.   OFEV  100 MG CAPS TAKE 1 CAPSULE BY MOUTH 2 TIMES A DAY WITH FOOD. TAKE 12 HOURS APART.   Oxycodone  HCl 10 MG TABS Take 10 mg by mouth 3 (three) times daily as needed (Pain).   rosuvastatin  (CRESTOR ) 10 MG tablet Take 1 tablet (10 mg total) by mouth daily. (Patient taking differently: Take 10 mg by mouth at bedtime.)  SYNJARDY  XR 12.07-998 MG TB24 Take 2 tablets by mouth daily. (Patient taking differently: Take 2 tablets by mouth daily. Pt now taking twice a day.)   VASCEPA  1 g capsule Take 2 capsules (2 g total) by mouth 2 (two) times daily.   No facility-administered encounter medications on file as of 10/18/2023.    Physical Exam: Blood pressure  114/70, pulse 65, height 6' (1.829 m), weight 194 lb (88 kg), SpO2 93%. Gen:      No acute distress HEENT:  EOMI, sclera anicteric Neck:     No masses; no thyromegaly Lungs:    Clear to auscultation bilaterally; normal respiratory effort CV:         Regular rate and rhythm; no murmurs Abd:      + bowel sounds; soft, non-tender; no palpable masses, no distension Ext:    No edema; adequate peripheral perfusion Neuro: alert and oriented x 3 Psych: normal mood and affect   Data Reviewed: Imaging: High-res CT 07/13/2019-groundglass attenuation with septal thickening, cylindrical bronchiectasis with mild basal gradient Probable UIP pattern High-res CT 06/24/2020-stable pulmonary fibrosis and probable UIP pattern High-res CT 04/21/2022-stable pattern of probable UIP pattern pulmonary fibrosis High-res CT 03/25/2023-mild progression of probable UIP pattern pulmonary fibrosis I have reviewed the images personally  PFTs: 09/01/2019 FVC 3.78 [78%], FEV1 3.14 [87%], F/F 83, TLC 6.40 [86%], DLCO 22.76 [81%] Normal test  06/28/2020 FVC 3.70 [71%], FEV1 3.05 [85%], F/F 82, TLC 5.76 [79%], DLCO 19.32 [69%] Minimal restriction, mild diffusion defect  07/22/2021 FVC 3.70 [1 7%], FEV1 3.01 [85%], F/F 81, TLC 5.84 [78%], DLCO 15.89 [57%] Minimal restriction, moderate diffusion defect  04/17/2022 FVC 3.69 [77%], FEV1 2.86 [82%], F/F78, TLC 5.49 [73%], DLCO 14.25 [52%] Mild restriction, moderate diffusion defect  03/25/2023 FVC 3.61 [76%], FEV1 2.89 [83%], F/F80, TLC 4.62 (2%], DLCO 15.22 [55%] Mild restriction, moderate diffusion defect  Labs: CTD serologies 08/30/2019-positive for ANA 1:40, nuclear, speckled  Hepatic panel 12/30/2021-significant for AST 42, total bilirubin 4.3  Sleep: CPAP download 11/25/2019-2% compliance  Cardiac: Echocardiogram  09/06/2020-LVEF 60 to 65%, normal RV systolic size and function, TAPSE 1.5 cm 07/12/2023-LVEF 60-65%, grade 1 diastolic dysfunction, normal RV systolic size  and function, TAPSE 3.2 cm  Assessment:  Pulmonary fibrosis, IPF Has probable UIP pattern on imaging There is no symptoms of connective tissue disease. Borderline positive ANA is likely nonspecific He did have Covid in December 2020 but it was a very mild case with no hospitalization, besides his symptoms preceded the Covid infection  He likely has IPF based on CT appearance in male smoker. Discussed further work-up such as bronchoscopy, lung biopsy but he wants to avoid invasive procedures After multiple discussions informed decision making with the patient we have started treatment with Ofev  which is tolerating well so far.  We need to be cautious given history of cirrhosis CT shows stable pulmonary fibrosis but he does have a drop in diffusion capacity which will monitor Finished pulmonary rehab We discussed lung transplant and he wants to think more about.  - Continue Ofev  100 mg twice daily - Encourage physical activity, such as walking the dog - Advise wearing a mask when mowing the lawn to avoid dust exposure - Order blood tests today - Schedule CT scan and lung function test in six months - Continue supplemental oxygen .  He is on 2 L at rest with continuous and 3 L with POC   Chronic cough Secondary to pulmonary fibrosis.  He is getting Hycodan  cough syrup We discussed  coming off Hycodan  as he is also on oxy for chronic pain and he is trying to limit use of cough medication as much as possible. -Resume Nexium  twice daily for potential acid reflux contributing to cough. -Start Neurontin  for cough suppression.  Weight Loss Noted loss of appetite and weight, potentially due to Ofev  and disease progression. -Encouraged high calorie, protein rich diet. -Consider over-the-counter supplementation with Ensure.  Severe OSA Noncompliant with CPAP.  Encouraged him to use it daily.  Health maintenance Up-to-date with Covid booster and flu vaccine  Follow-up in 6 months.    Plan/Recommendations: Continue Ofev  Labs for monitoring  Lonna Coder MD [facility] 10/18/2023, 9:50 AM  CC: Rollene Almarie LABOR, *

## 2023-10-18 NOTE — Patient Instructions (Signed)
 VISIT SUMMARY:  You came in today for a follow-up visit regarding your pulmonary fibrosis. We discussed your current symptoms, medication side effects, and environmental exposure concerns.  YOUR PLAN:  -PULMONARY FIBROSIS: Pulmonary fibrosis is a condition where the lung tissue becomes scarred and stiff, making it difficult to breathe. You are currently managing this with Ofev  100 mg twice daily, which has caused a decreased appetite. You have discontinued pulmonary rehabilitation but are staying active by walking your dog. Please continue taking Ofev  as prescribed and maintain physical activity. Wear a mask when mowing the lawn to avoid dust exposure. We will also conduct blood tests today and schedule a CT scan and lung function test in six months.  INSTRUCTIONS:  Please continue taking Ofev  100 mg twice daily and stay active by walking your dog. Wear a mask when mowing the lawn to avoid dust exposure. We will conduct blood tests today and schedule a CT scan and lung function test in six months.

## 2023-10-19 ENCOUNTER — Other Ambulatory Visit: Payer: Self-pay

## 2023-10-19 ENCOUNTER — Other Ambulatory Visit: Payer: Self-pay | Admitting: *Deleted

## 2023-10-19 DIAGNOSIS — R053 Chronic cough: Secondary | ICD-10-CM

## 2023-10-19 NOTE — Telephone Encounter (Signed)
 Copied from CRM [id number]. Topic: Clinical - Prescription Issue >> Oct 19, 2023 11:29 AM Isabell A wrote: Reason for CRM: Patient is requesting to send his medication HYDROcodone  bit-homatropine (HYCODAN ) 5-1.5 MG/5ML syrup to the following pharmacy - states it was originally sent to the wrong pharmacy.   [id number] [address] [place], [place] [facility] [address], [place] [address] Phone: [phone]  Fax: [phone]  Please advise Dr. Theophilus. Rx was sent to wrong pharmacy.  I have pended Rx with pt preferred pharmacy.

## 2023-10-19 NOTE — Telephone Encounter (Signed)
 Copied from CRM [id number]. Topic: Clinical - Prescription Issue >> Oct 19, 2023 11:29 AM Tyler Hendricks wrote: Reason for CRM: Patient is requesting to send his medication HYDROcodone  bit-homatropine (HYCODAN ) 5-1.5 MG/5ML syrup to the following pharmacy - states it was originally sent to the wrong pharmacy.   [id number] [address] [place], [place] [address] [facility] [address] [place] [address] Phone: [phone]  Fax: [phone] >> Oct 19, 2023  4:05 PM Tyler Hendricks wrote: Patient calling to inquire about completion status of this task. Please inform patient when task is complete.     I called and spoke with the pt. He states that [facility] does not have hycodan  in stock. He is now asking [facility] to [facility] [address]. I pended order. Please sign thanks!

## 2023-10-20 MED ORDER — HYDROCODONE BIT-HOMATROP MBR 5-1.5 MG/5ML PO SOLN: 5.0000 mL | Freq: Four times a day (QID) | ORAL | 0 refills | Status: DC | PRN

## 2023-10-20 NOTE — Telephone Encounter (Signed)
 Refill sent.

## 2023-10-22 ENCOUNTER — Ambulatory Visit: Payer: Self-pay | Admitting: Pulmonary Disease

## 2023-10-22 DIAGNOSIS — J84112 Idiopathic pulmonary fibrosis: Secondary | ICD-10-CM

## 2023-10-22 DIAGNOSIS — R0602 Shortness of breath: Secondary | ICD-10-CM

## 2023-10-22 DIAGNOSIS — R053 Chronic cough: Secondary | ICD-10-CM

## 2023-10-25 ENCOUNTER — Other Ambulatory Visit: Payer: Self-pay

## 2023-10-27 ENCOUNTER — Encounter (INDEPENDENT_AMBULATORY_CARE_PROVIDER_SITE_OTHER): Payer: Self-pay

## 2023-10-27 ENCOUNTER — Encounter: Payer: Self-pay | Admitting: Cardiology

## 2023-10-27 ENCOUNTER — Ambulatory Visit: Attending: Cardiology | Admitting: Cardiology

## 2023-10-27 VITALS — BP 110/54 | HR 56 | Ht 72.0 in | Wt 196.2 lb

## 2023-10-27 DIAGNOSIS — I25118 Atherosclerotic heart disease of native coronary artery with other forms of angina pectoris: Secondary | ICD-10-CM | POA: Diagnosis not present

## 2023-10-27 DIAGNOSIS — R0609 Other forms of dyspnea: Secondary | ICD-10-CM | POA: Insufficient documentation

## 2023-10-27 DIAGNOSIS — I2729 Other secondary pulmonary hypertension: Secondary | ICD-10-CM | POA: Insufficient documentation

## 2023-10-27 DIAGNOSIS — E782 Mixed hyperlipidemia: Secondary | ICD-10-CM | POA: Insufficient documentation

## 2023-10-27 NOTE — Patient Instructions (Signed)
  Follow-Up: At [facility], you and your health needs are our priority.  As part of our continuing mission to provide you with exceptional heart care, our providers are all part of one team.  This team includes your primary Cardiologist (physician) and Advanced Practice Providers or APPs (Physician Assistants and Nurse Practitioners) who all work together to provide you with the care you need, when you need it.  Your next appointment:   4 week(s)  Provider:   Newman JINNY Lawrence, MD

## 2023-10-27 NOTE — Progress Notes (Addendum)
 Cardiology Office Note:  .   Date:  10/27/2023  ID:  Lamar Clubs, DOB 19-Oct-1950, MRN [id number] PCP: Rollene Almarie LABOR, MD  [facility] Providers Cardiologist:  Newman Lawrence, MD PCP: Rollene Almarie LABOR, MD  Chief Complaint  Patient presents with   Coronary Artery Disease   Exertional dyspnea      History of Present Illness: .    Tyler Hendricks is a 73 y.o. male with hypertension, hyperlipidemia,  type 2 diabetes mellitus, CAD (multiple prior PCI's), severe aortic stenosis- now s/p TAVR (06/08/23)  pulmonary fibrosis,  hep B cirrhosis, HIV  Prior stents: OM1 3.0 x 23 mm, left posterolateral 2.5 x 13 mm, diagonal 1 2.5 x 13, ostial diagonal 2.5 mm, proximal LAD- unknown size, mid LAD 3.0 x 13  - all BMS.    Patient underwent successful TAVR in 05/2023.  However, he has continued to have exertional dyspnea.  He has known pulmonary fibrosis for which he is seeing Dr. Theophilus.  Of note, cardiac catheterization pre-TAVR showed severe diagonal stent ISR, and mild pulmonary hypertension with PASP of 31 mmHg.   Vitals:   10/27/23 1045  BP: (!) 110/54  Pulse: (!) 56  SpO2: 96%      ROS:  Review of Systems  Cardiovascular:  Positive for dyspnea on exertion. Negative for chest pain, leg swelling, palpitations and syncope.  Respiratory:  Positive for cough.      Studies Reviewed: SABRA        EKG 02/15/2023: Sinus rhythm with occasional Premature ventricular complexes Right bundle branch block Left anterior fascicular block Bifascicular block Minimal voltage criteria for LVH, may be normal variant ( R in aVL ) No previous ECGs available  Echocardiogram 06/2023:   1. 26 mm S3. Gradients are stable. Trivial PVL. The aortic valve has been  repaired/replaced. Aortic valve regurgitation is trivial. There is a 26 mm  Sapien prosthetic (TAVR) valve present in the aortic position. Procedure  Date: 06/08/2023. Echo findings are consistent with normal structure and   function of the aortic valve prosthesis. Aortic valve area, by VTI measures 1.78 cm. Aortic valve mean gradient measures 15.3 mmHg. Aortic valve Vmax measures 2.68 m/s.   2. Left ventricular ejection fraction, by estimation, is 60 to 65%. The  left ventricle has normal function. The left ventricle has no regional  wall motion abnormalities. There is mild concentric left ventricular  hypertrophy. Left ventricular diastolic  parameters are consistent with Grade I diastolic dysfunction (impaired  relaxation).   3. Right ventricular systolic function is normal. The right ventricular  size is normal. Tricuspid regurgitation signal is inadequate for assessing  PA pressure.   4. The mitral valve is grossly normal. Trivial mitral valve  regurgitation. No evidence of mitral stenosis.   5. The inferior vena cava is normal in size with greater than 50%  respiratory variability, suggesting right atrial pressure of 3 mmHg.   Coronary angiography 04/23/2023: LM: No true left main seen         Patient appears to have 2 separate ostia, one for the LAD and the other for ramus and left circumflex. LAD: Prox 30% disease          Bifurcation stents in prox mid LAD and diag 1          LAD stent has minimal 30% late lumen loss          Ostial diag 1 stent has 90% ISR Ramus: Ostial ramus tent with no restenosis Lcx: Mid 50%  stenosis RCA: Minimal luminal irregularities with mid 30% disease   Right heart catheterization 04/23/2023: RA: 2 mmHg RV: 49/1 mmHg PA: 48/20 mmHg, mPAP 31 mmHg PCW: 9 mmHg   AO sats: 97% PA sats: 80% RV sats 82% RA sats 80% PA sats rather high, but no true step up seen in shunt run   CO: 7.4 L/min CI: 3.4 L/min/m2   Multivessel coronary artery disease Moderate nonobstructive disease with patent stents, except severe ISR in ostial diag stent  US  abdomen 04/2023: Cirrhosis and splenomegaly. No focal liver lesions.    Labs 09/2023: Cr 0.68. AlKP 133, Albumin 3.1, T.bili  3.1  06/2023: Hb 16.8, platelets 84 Cr 0.68. AlKP 133, Albumin 3.1, T.bili 3.1  Labs 10/2022: Cr 0.6, K 4.0 Chol 142, TG 68, HDL 70, LDL 58 TSH 1.8 HbA1C 6.8%   Physical Exam:   Physical Exam Vitals and nursing note reviewed.  Constitutional:      General: He is not in acute distress. Neck:     Vascular: No JVD.  Cardiovascular:     Rate and Rhythm: Normal rate and regular rhythm.     Pulses:          Carotid pulses are  on the right side with bruit.    Heart sounds: Murmur heard.     High-pitched blowing holosystolic murmur is present at the apex.     High-pitched blowing holosystolic murmur of grade 2/6 is also present at the apex.  Pulmonary:     Effort: Pulmonary effort is normal.     Breath sounds: Normal breath sounds. No wheezing or rales.  Musculoskeletal:     Right lower leg: No edema.     Left lower leg: No edema.      VISIT DIAGNOSES:   ICD-10-CM   1. Coronary artery disease involving native coronary artery of native heart with other form of angina pectoris (HCC)  I25.118     2. Mixed hyperlipidemia  E78.2     3. Exertional dyspnea  R06.09     4. Other secondary pulmonary hypertension (HCC)  I27.29         ASSESSMENT AND PLAN: .    Tyler Hendricks is a 73 y.o. male with hypertension, hyperlipidemia,  type 2 diabetes mellitus, CAD (multiple prior PCI's), severe aortic stenosis- now s/p TAVR (06/08/23)  pulmonary fibrosis,  hep B cirrhosis, HIV  Aortic stenosis:  Now s/p TAVR (05/2023). No heart failure signs on exam.  Exertional dyspnea: Persistent exertional dyspnea with post TAVR without any signs of heart failure. Differential include pulmonary fibrosis, pulmonary hypertension, as well as angina equivalent emanating from severe in-stent restenosis within diagonal stent.  While he did have mild pulmonary hypertension pre-TAVR, this was related to severe aortic stenosis, it would improve post TAVR.  Of note, his wedge pressure was only 9 mmHg when his  mean PA systolic pressure was 31 mmHg, raising possibility of WHO group 1 or group 3 pulmonary hypertension. I discussed with the patient regarding repeat right and left heart catheterization to reassess his pulmonary hypertension, as well as consider drug-coated balloon treatment for ISR with an old BMS inside diagonal stent. Patient has a lot of family commitments related to medical illnesses in the family.  He is not sure how to proceed at this time.  In the meantime, I will send a message to Dr. Theophilus whether he would need further workup and management for pulmonary hypertension.  CAD: Persistent exertional dyspnea, see discussion above.   Tolerating Plavix   monotherapy, continue the same. Lipids well-controlled.,  continue statin Continue amlodipine , atenolol .   F/u in 4 weeks  Signed, Newman JINNY Lawrence, MD

## 2023-10-28 ENCOUNTER — Other Ambulatory Visit: Payer: Self-pay

## 2023-10-28 ENCOUNTER — Other Ambulatory Visit: Payer: Self-pay | Admitting: Pharmacy Technician

## 2023-10-28 NOTE — Progress Notes (Signed)
 Specialty Pharmacy Refill Coordination Note  Tyler Hendricks is a 73 y.o. male contacted today regarding refills of specialty medication(s) Bictegravir-Emtricitab-Tenofov (Biktarvy )   Patient requested (Patient-Rptd) Delivery   Delivery date: 11/04/23 Verified address: (Patient-Rptd) [address] [place] [address]   Medication will be filled on 11/03/23.

## 2023-10-29 ENCOUNTER — Other Ambulatory Visit: Payer: Self-pay

## 2023-11-03 ENCOUNTER — Telehealth: Payer: Self-pay | Admitting: Cardiology

## 2023-11-03 NOTE — Telephone Encounter (Signed)
 From the valve replacement standpoint itself, he does not have any restrictions about flying.  I last saw him on 10/27/2023, when he still had residual exertional dyspnea symptoms.  I have recommended some further workup for this.  However, none of this precludes him from flying.  Thanks MJP

## 2023-11-03 NOTE — Telephone Encounter (Signed)
 Pt states that he had a Heart Valve about a month ago and would like to know if he is able to fly. Please advise

## 2023-11-03 NOTE — Telephone Encounter (Signed)
 From the valve replacement standpoint itself, he does not have any restrictions about flying.  I last saw him on 10/27/2023, when he still had residual exertional dyspnea symptoms.  I have recommended  some further workup for this.  However, none of this precludes him from flying.    Thanks  MJP    Left the pt a message to call the office back to endorse the above recommendations per Dr. Elmira.

## 2023-11-03 NOTE — Telephone Encounter (Signed)
 Will forward this message to Dr. Elmira for further review and advisement.   Triage nursing to follow-up with the pt accordingly thereafter.

## 2023-11-05 DIAGNOSIS — H35033 Hypertensive retinopathy, bilateral: Secondary | ICD-10-CM | POA: Diagnosis not present

## 2023-11-05 DIAGNOSIS — H04123 Dry eye syndrome of bilateral lacrimal glands: Secondary | ICD-10-CM | POA: Diagnosis not present

## 2023-11-05 DIAGNOSIS — Z961 Presence of intraocular lens: Secondary | ICD-10-CM | POA: Diagnosis not present

## 2023-11-05 DIAGNOSIS — E119 Type 2 diabetes mellitus without complications: Secondary | ICD-10-CM | POA: Diagnosis not present

## 2023-11-05 DIAGNOSIS — H353132 Nonexudative age-related macular degeneration, bilateral, intermediate dry stage: Secondary | ICD-10-CM | POA: Diagnosis not present

## 2023-11-08 NOTE — Telephone Encounter (Signed)
 Left detailed message for patient of dr patwardhan's recommendations. Patient has a follow up appointment 11/26/23.

## 2023-11-10 ENCOUNTER — Ambulatory Visit: Payer: Self-pay

## 2023-11-10 NOTE — Telephone Encounter (Signed)
 FYI Only or Action Required?: Action required by provider: update on patient condition.  Patient was last seen in primary care on 09/22/2023 by Purcell Emil Schanz, MD.  Called Nurse Triage reporting Triage.  Symptoms began about a month ago.  Interventions attempted: Rest, hydration, or home remedies.  Symptoms are: unchanged.  Triage Disposition: See PCP within 24 hours  Patient/caregiver understands and will follow disposition?:  Reason for Disposition  SEVERE leg swelling (e.g., swelling extends above knee, entire leg is swollen, weeping fluid)  Answer Assessment - Initial Assessment Questions Patient denies higher acuity questions. No same day availability. Advised to call cardiology office as well to report edema. ED precautions reviewed, pt verbalized understanding.    ONSET: When did the swelling start? (e.g., minutes, hours, days)     4 weeks ago  LOCATION: What part of the leg is swollen?  Are both legs swollen or just one leg?     Entire legs  SEVERITY: How bad is the swelling? (e.g., localized; mild, moderate, severe)     Wife reports appears moderate  MEDICAL HISTORY: Do you have a history of blood clots (e.g., DVT), cancer, heart failure, kidney disease, or liver failure?     Pulmonary fibrosis  OTHER SYMPTOMS: Do you have any other symptoms? (e.g., chest pain, difficulty breathing)       Denies  Protocols used: Leg Swelling and Edema-A-AH Copied from CRM #[id number]. Topic: Clinical - Red Word Triage >> Nov 10, 2023 10:55 AM Precious C wrote: [facility] that prompted transfer to Nurse Triage: SWELLING  Pt called in due swelling in legs.

## 2023-11-11 ENCOUNTER — Ambulatory Visit: Admitting: Family

## 2023-11-11 ENCOUNTER — Encounter: Payer: Self-pay | Admitting: Family

## 2023-11-11 VITALS — Temp 98.1°F | Ht 72.0 in | Wt 199.0 lb

## 2023-11-11 DIAGNOSIS — J849 Interstitial pulmonary disease, unspecified: Secondary | ICD-10-CM

## 2023-11-11 DIAGNOSIS — R600 Localized edema: Secondary | ICD-10-CM | POA: Diagnosis not present

## 2023-11-11 DIAGNOSIS — R42 Dizziness and giddiness: Secondary | ICD-10-CM | POA: Diagnosis not present

## 2023-11-11 DIAGNOSIS — R748 Abnormal levels of other serum enzymes: Secondary | ICD-10-CM | POA: Diagnosis not present

## 2023-11-11 LAB — CBC WITH DIFFERENTIAL/PLATELET
Basophils Absolute: 0.1 K/uL (ref 0.0–0.1)
Basophils Relative: 1.3 % (ref 0.0–3.0)
Eosinophils Absolute: 0.3 K/uL (ref 0.0–0.7)
Eosinophils Relative: 5.3 % — ABNORMAL HIGH (ref 0.0–5.0)
HCT: 48.2 % (ref 39.0–52.0)
Hemoglobin: 16.3 g/dL (ref 13.0–17.0)
Lymphocytes Relative: 31.2 % (ref 12.0–46.0)
Lymphs Abs: 1.9 K/uL (ref 0.7–4.0)
MCHC: 33.8 g/dL (ref 30.0–36.0)
MCV: 101.5 fl — ABNORMAL HIGH (ref 78.0–100.0)
Monocytes Absolute: 0.5 K/uL (ref 0.1–1.0)
Monocytes Relative: 8.2 % (ref 3.0–12.0)
Neutro Abs: 3.2 K/uL (ref 1.4–7.7)
Neutrophils Relative %: 54 % (ref 43.0–77.0)
Platelets: 111 K/uL — ABNORMAL LOW (ref 150.0–400.0)
RBC: 4.75 Mil/uL (ref 4.22–5.81)
RDW: 15.9 % — ABNORMAL HIGH (ref 11.5–15.5)
WBC: 6 K/uL (ref 4.0–10.5)

## 2023-11-11 LAB — COMPREHENSIVE METABOLIC PANEL WITH GFR
ALT: 22 U/L (ref 0–53)
AST: 45 U/L — ABNORMAL HIGH (ref 0–37)
Albumin: 2.9 g/dL — ABNORMAL LOW (ref 3.5–5.2)
Alkaline Phosphatase: 161 U/L — ABNORMAL HIGH (ref 39–117)
BUN: 18 mg/dL (ref 6–23)
CO2: 29 meq/L (ref 19–32)
Calcium: 8.7 mg/dL (ref 8.4–10.5)
Chloride: 102 meq/L (ref 96–112)
Creatinine, Ser: 0.73 mg/dL (ref 0.40–1.50)
GFR: 90.49 mL/min (ref >60.00)
Glucose, Bld: 130 mg/dL — ABNORMAL HIGH (ref 70–99)
Potassium: 3.7 meq/L (ref 3.5–5.1)
Sodium: 138 meq/L (ref 135–145)
Total Bilirubin: 2.1 mg/dL — ABNORMAL HIGH (ref 0.2–1.2)
Total Protein: 5.9 g/dL — ABNORMAL LOW (ref 6.0–8.3)

## 2023-11-11 LAB — BRAIN NATRIURETIC PEPTIDE: Pro B Natriuretic peptide (BNP): 945 pg/mL — ABNORMAL HIGH (ref 0.0–100.0)

## 2023-11-11 MED ORDER — AMLODIPINE BESYLATE 5 MG PO TABS: 5.0000 mg | ORAL_TABLET | Freq: Every day | ORAL | Status: AC

## 2023-11-11 NOTE — Progress Notes (Signed)
 Acute Office Visit  Subjective:   Hx   Patient ID: Tyler Hendricks, male    DOB: Mar 19, 1951, 73 y.o.   MRN: [id number]  Chief Complaint  Patient presents with   Leg Swelling    Pt currently take a water pill everyday but has been having some swelling within the past month    HPI Patient is a 73 year old, patient of [name] with a history of pulmonary fibrosis, heart disease, type 2 diabetes, and HIV disease in today with complaints of lower extremity edema that has been going on for 1 month.  He has mild discomfort in the legs bilaterally.  Does report that he gets diet has been a typical eating more salty foods and snacking more.  His wife has been gone to the [place] for the past few weeks after the death of her son.  Reports that he takes losartan  HCT that has not been helping to pull the fluid off.  He has an upcoming visit with cardiology to discuss a cardiac catheterization to replace the stent.  That appointment is September 5.  Patient also admits to dizziness that typically is with position change.  He is oxygen  dependent and does not always wear his oxygen .  In the office today he was 85% without his oxygen  but recovered well when he applied for oxygen .  Denies any headaches, no increased shortness of breath, no chest pain.  Review of Systems  Constitutional: Negative.   Respiratory: Negative.         Chronic SOB  Cardiovascular:  Positive for leg swelling. Negative for chest pain and palpitations.  Musculoskeletal: Negative.   Neurological:  Positive for dizziness.  Psychiatric/Behavioral: Negative.    All other systems reviewed and are negative.  Past Medical History:  Diagnosis Date   CAD (coronary artery disease)    Diabetes mellitus without complication (HCC)    Essential (primary) hypertension    Gallstones    Hepatic cirrhosis (HCC)    Hepatitis B core antibody positive 01/08/2019   Hepatitis C    HIV infection (HCC)    Hyperlipidemia    Hypertension     Internal hemorrhoids    Kidney stones    Pulmonary fibrosis (HCC)    S/P TAVR (transcatheter aortic valve replacement) 06/08/2023   s/p TAVR with a 26mm Edwards S3UR via the TF approach by Dr. Verlin & Dr. Maryjane   Severe aortic stenosis    Sleep apnea    severe, on CPAP   Tubular adenoma of colon     Social History   Socioeconomic History   Marital status: Married    Spouse name: Not on file   Number of children: 1   Years of education: Not on file   Highest education level: 9th grade  Occupational History   Occupation: retired   Occupation: Retired Psychologist, forensic  Tobacco Use   Smoking status: Former    Current packs/day: 0.00    Average packs/day: 2.0 packs/day for 20.0 years (40.0 ttl pk-yrs)    Types: Cigarettes    Start date: 54    Quit date: 1994    Years since quitting: 31.6    Passive exposure: Past   Smokeless tobacco: Never  Vaping Use   Vaping status: Never Used  Substance and Sexual Activity   Alcohol use: Not Currently    Comment: social-occ beer   Drug use: No   Sexual activity: Yes  Other Topics Concern   Not on file  Social History Narrative  Not on file   Social Drivers of Health   Financial Resource Strain: Low Risk  (11/02/2022)   Overall Financial Resource Strain (CARDIA)    Difficulty of Paying Living Expenses: Not hard at all  Food Insecurity: No Food Insecurity (06/10/2023)   Hunger Vital Sign    Worried About Running Out of Food in the Last Year: Never true    Ran Out of Food in the Last Year: Never true  Transportation Needs: No Transportation Needs (06/10/2023)   PRAPARE - Administrator, Civil Service (Medical): No    Lack of Transportation (Non-Medical): No  Physical Activity: Insufficiently Active (11/02/2022)   Exercise Vital Sign    Days of Exercise per Week: 2 days    Minutes of Exercise per Session: 30 min  Stress: Stress Concern Present (11/02/2022)   [employer] of Occupational Health -  Occupational Stress Questionnaire    Feeling of Stress : To some extent  Social Connections: Socially Isolated (06/09/2023)   Social Connection and Isolation Panel    Frequency of Communication with Friends and Family: Twice a week    Frequency of Social Gatherings with Friends and Family: Never    Attends Religious Services: Never    Database administrator or Organizations: No    Attends Banker Meetings: Never    Marital Status: Married  Catering manager Violence: Patient Unable To Answer (06/10/2023)   Humiliation, Afraid, Rape, and Kick questionnaire    Fear of Current or Ex-Partner: Patient unable to answer    Emotionally Abused: Patient unable to answer    Physically Abused: Patient unable to answer    Sexually Abused: Patient unable to answer    Past Surgical History:  Procedure Laterality Date   CARDIAC CATHETERIZATION Left 04/2013   chlecystectomy     CHOLECYSTECTOMY     COLONOSCOPY  05/02/2013   GAS/FLUID EXCHANGE Right 12/30/2021   Procedure: GAS/FLUID EXCHANGE;  Surgeon: Tobie Baptist, MD;  Location: [facility] OR;  Service: Ophthalmology;  Laterality: Right;  SF6   INTRAOPERATIVE TRANSTHORACIC ECHOCARDIOGRAM N/A 06/08/2023   Procedure: ECHOCARDIOGRAM, TRANSTHORACIC;  Surgeon: Verlin Lonni BIRCH, MD;  Location: [facility] INVASIVE CV LAB;  Service: Cardiovascular;  Laterality: N/A;   PARS PLANA VITRECTOMY Right 12/30/2021   Procedure: PARS PLANA VITRECTOMY WITH 25 GAUGE;  Surgeon: Tobie Baptist, MD;  Location: [facility] OR;  Service: Ophthalmology;  Laterality: Right;   PERCUTANEOUS CORONARY STENT INTERVENTION (PCI-S)     PHOTOCOAGULATION WITH LASER Right 12/30/2021   Procedure: PHOTOCOAGULATION WITH LASER;  Surgeon: Tobie Baptist, MD;  Location: [facility] OR;  Service: Ophthalmology;  Laterality: Right;   POLYPECTOMY     RIGHT/LEFT HEART CATH AND CORONARY ANGIOGRAPHY N/A 04/23/2023   Procedure: RIGHT/LEFT HEART CATH AND CORONARY ANGIOGRAPHY;  Surgeon: Elmira Newman PARAS, MD;   Location: [facility] INVASIVE CV LAB;  Service: Cardiovascular;  Laterality: N/A;   UMBILICAL HERNIA REPAIR      Family History  Problem Relation Age of Onset   Emphysema Mother        pulmonary   Heart attack Father 30   Lung cancer Sister    Esophageal cancer Brother    Esophageal cancer Brother    Colon cancer Neg Hx    Inflammatory bowel disease Neg Hx    Liver disease Neg Hx    Pancreatic cancer Neg Hx    Rectal cancer Neg Hx    Stomach cancer Neg Hx    Colon polyps Neg Hx    Sleep apnea Neg Hx  Allergies  Allergen Reactions   Integrilin [Eptifibatide] Other (See Comments)    Bleeding (non-specific)   Lopressor [Metoprolol Tartrate] Rash and Other (See Comments)    Bleeding (non-specific)    Penicillins Diarrhea and Rash    Current Outpatient Medications on File Prior to Visit  Medication Sig Dispense Refill   atenolol  (TENORMIN ) 50 MG tablet TAKE 1 TABLET BY MOUTH EVERY DAY 90 tablet 1   bictegravir-emtricitabine -tenofovir  AF (BIKTARVY ) 50-200-25 MG TABS tablet Take 1 tablet by mouth daily. 30 tablet 11   cholecalciferol (VITAMIN D3) 25 MCG (1000 UNIT) tablet Take 1,000 Units by mouth daily.     clopidogrel  (PLAVIX ) 75 MG tablet TAKE 1 TABLET BY MOUTH EVERY DAY 90 tablet 3   HYDROcodone  bit-homatropine (HYCODAN ) 5-1.5 MG/5ML syrup Take 5 mLs by mouth every 6 (six) hours as needed (chronic cough - pulmonary fibrosis). 473 mL 0   losartan -hydrochlorothiazide  (HYZAAR ) 100-25 MG tablet Take 1 tablet by mouth daily. 90 tablet 3   Multiple Vitamin (MULTIVITAMIN) tablet Take 1 tablet by mouth daily.     OFEV  100 MG CAPS TAKE 1 CAPSULE BY MOUTH 2 TIMES A DAY WITH FOOD. TAKE 12 HOURS APART. 180 capsule 1   rosuvastatin  (CRESTOR ) 10 MG tablet Take 1 tablet (10 mg total) by mouth daily. 90 tablet 3   SYNJARDY  XR 12.07-998 MG TB24 Take 2 tablets by mouth daily. 180 tablet 3   VASCEPA  1 g capsule Take 2 capsules (2 g total) by mouth 2 (two) times daily. 360 capsule 3   Oxycodone  HCl  10 MG TABS Take 10 mg by mouth 3 (three) times daily as needed (Pain). (Patient not taking: Reported on 11/11/2023)     No current facility-administered medications on file prior to visit.    Temp 98.1 F (36.7 C) (Oral)   Ht 6' (1.829 m)   Wt 199 lb (90.3 kg)   SpO2 93%   BMI 26.99 kg/m      Objective:    BP 122/80 (BP Location: Left Arm, Patient Position: Sitting, Cuff Size: Normal)   Pulse 61   Temp 98.1 F (36.7 C) (Oral)   Ht 6' (1.829 m)   Wt 199 lb (90.3 kg)   SpO2 95%   BMI 26.99 kg/m    Physical Exam Vitals reviewed.  Constitutional:      Appearance: Normal appearance. He is normal weight.  HENT:     Right Ear: Tympanic membrane, ear canal and external ear normal.     Left Ear: Tympanic membrane, ear canal and external ear normal.  Eyes:     Pupils: Pupils are equal, round, and reactive to light.  Cardiovascular:     Rate and Rhythm: Normal rate and regular rhythm.     Pulses: Normal pulses.     Heart sounds: Normal heart sounds.  Pulmonary:     Effort: Pulmonary effort is normal. No respiratory distress.     Breath sounds: Normal breath sounds. No stridor. No wheezing or rhonchi.  Abdominal:     General: Abdomen is flat. Bowel sounds are normal.     Palpations: Abdomen is soft.  Musculoskeletal:        General: Swelling present.     Cervical back: Normal range of motion and neck supple.     Right lower leg: Edema present.     Left lower leg: Edema present.     Comments: 2+ pitting edema below the knees to the feet  Skin:    General: Skin is warm and  dry.  Neurological:     General: No focal deficit present.     Mental Status: He is alert and oriented to person, place, and time. Mental status is at baseline.  Psychiatric:        Mood and Affect: Mood normal.        Behavior: Behavior normal.     No results found for any visits on 11/11/23.      Assessment & Plan:   Problem List Items Addressed This Visit     Interstitial pulmonary disease  (HCC)   Relevant Orders   CMP   CBC w/Diff   Other Visit Diagnoses       Peripheral edema    -  Primary   Relevant Orders   CMP   B Nat Peptide   CBC w/Diff     Dizziness       Relevant Orders   CMP   CBC w/Diff     Elevated liver enzymes       Relevant Orders   CMP   B Nat Peptide       Meds ordered this encounter  Medications   amLODipine  (NORVASC ) 5 MG tablet    Sig: Take 1 tablet (5 mg total) by mouth daily.   We are decreasing the amlodipine  to 5 mg from 10 mg in hopes of the swelling resolving.  Patient will notify us  pending results.  Labs were obtained as well we discussed pending results.  Orthostatic blood pressure readings stable.  However, patient does not always wear his oxygen  as he should.  Advised patient to wear his oxygen  he is up moving around walking and at night.  The office if symptoms worsen or persist.  Recheck as scheduled and sooner as needed. No follow-ups on file.  Reizy Dunlow B Kierstan Auer, FNP

## 2023-11-12 ENCOUNTER — Ambulatory Visit: Payer: Self-pay | Admitting: Family

## 2023-11-12 ENCOUNTER — Other Ambulatory Visit: Payer: Self-pay | Admitting: Family

## 2023-11-12 MED ORDER — FUROSEMIDE 20 MG PO TABS: 20.0000 mg | ORAL_TABLET | Freq: Two times a day (BID) | ORAL | 0 refills | Status: DC

## 2023-11-15 ENCOUNTER — Other Ambulatory Visit: Payer: Self-pay | Admitting: Internal Medicine

## 2023-11-16 ENCOUNTER — Other Ambulatory Visit: Payer: Self-pay | Admitting: Pulmonary Disease

## 2023-11-16 DIAGNOSIS — R053 Chronic cough: Secondary | ICD-10-CM

## 2023-11-16 MED ORDER — HYDROCODONE BIT-HOMATROP MBR 5-1.5 MG/5ML PO SOLN: 5.0000 mL | Freq: Four times a day (QID) | ORAL | 0 refills | Status: DC | PRN

## 2023-11-16 NOTE — Telephone Encounter (Signed)
**Note De-identified  [place] Obfuscation** Please advise 

## 2023-11-16 NOTE — Progress Notes (Signed)
 Patient has been scheduled for office visit as well

## 2023-11-16 NOTE — Telephone Encounter (Unsigned)
 Copied from CRM #[id number]. Topic: Clinical - Medication Refill >> Nov 16, 2023  9:32 AM Rozanna MATSU wrote: Medication: HYDROcodone  bit-homatropine (HYCODAN ) 5-1.5 MG/5ML syrup  Has the patient contacted their pharmacy? No (Agent: If no, request that the patient contact the pharmacy for the refill. If patient does not wish to contact the pharmacy document the reason why and proceed with request.) (Agent: If yes, when and what did the pharmacy advise?)  This is the patient's preferred pharmacy:  [id number] [place], [place] - [address] [place] [address] Phone: [phone] Fax: [phone]      Is this the correct pharmacy for this prescription? Yes If no, delete pharmacy and type the correct one.   Has the prescription been filled recently? Yes  Is the patient out of the medication? Yes  Has the patient been seen for an appointment in the last year OR does the patient have an upcoming appointment? Yes  Can we respond through MyChart? No  Agent: Please be advised that Rx refills may take up to 3 business days. We ask that you follow-up with your pharmacy.

## 2023-11-17 NOTE — Telephone Encounter (Signed)
Patient is calling to check on his refill request.

## 2023-11-23 DIAGNOSIS — L814 Other melanin hyperpigmentation: Secondary | ICD-10-CM | POA: Diagnosis not present

## 2023-11-23 DIAGNOSIS — L570 Actinic keratosis: Secondary | ICD-10-CM | POA: Diagnosis not present

## 2023-11-23 DIAGNOSIS — L820 Inflamed seborrheic keratosis: Secondary | ICD-10-CM | POA: Diagnosis not present

## 2023-11-23 DIAGNOSIS — Z08 Encounter for follow-up examination after completed treatment for malignant neoplasm: Secondary | ICD-10-CM | POA: Diagnosis not present

## 2023-11-23 DIAGNOSIS — D492 Neoplasm of unspecified behavior of bone, soft tissue, and skin: Secondary | ICD-10-CM | POA: Diagnosis not present

## 2023-11-23 DIAGNOSIS — D0439 Carcinoma in situ of skin of other parts of face: Secondary | ICD-10-CM | POA: Diagnosis not present

## 2023-11-23 DIAGNOSIS — Z85828 Personal history of other malignant neoplasm of skin: Secondary | ICD-10-CM | POA: Diagnosis not present

## 2023-11-23 DIAGNOSIS — D225 Melanocytic nevi of trunk: Secondary | ICD-10-CM | POA: Diagnosis not present

## 2023-11-23 DIAGNOSIS — L821 Other seborrheic keratosis: Secondary | ICD-10-CM | POA: Diagnosis not present

## 2023-11-26 ENCOUNTER — Ambulatory Visit: Admitting: Internal Medicine

## 2023-11-26 ENCOUNTER — Encounter: Payer: Self-pay | Admitting: Cardiology

## 2023-11-26 ENCOUNTER — Ambulatory Visit: Attending: Cardiology | Admitting: Cardiology

## 2023-11-26 VITALS — BP 122/62 | HR 65 | Resp 16 | Ht 72.0 in | Wt 188.0 lb

## 2023-11-26 DIAGNOSIS — I10 Essential (primary) hypertension: Secondary | ICD-10-CM | POA: Insufficient documentation

## 2023-11-26 DIAGNOSIS — I25118 Atherosclerotic heart disease of native coronary artery with other forms of angina pectoris: Secondary | ICD-10-CM | POA: Insufficient documentation

## 2023-11-26 NOTE — Patient Instructions (Addendum)
 Medication Instructions:  No medication changes were made at this visit. Continue current regimen.   *If you need a refill on your cardiac medications before your next appointment, please call your pharmacy*  Lab Work: NONE If you have labs (blood work) drawn today and your tests are completely normal, you will receive your results only by: MyChart Message (if you have MyChart) OR A paper copy in the mail If you have any lab test that is abnormal or we need to change your treatment, we will call you to review the results.  Testing/Procedures: PET/CT Stress Test   Follow-Up: At [facility], you and your health needs are our priority.  As part of our continuing mission to provide you with exceptional heart care, our providers are all part of one team.  This team includes your primary Cardiologist (physician) and Advanced Practice Providers or APPs (Physician Assistants and Nurse Practitioners) who all work together to provide you with the care you need, when you need it.  Your next appointment:   6-8 Weeks -AFTER PET/CT STRESS TEST IS DONE  Provider:   Newman JINNY Lawrence, MD    We recommend signing up for the patient portal called MyChart.  Sign up information is provided on this After Visit Summary.  MyChart is used to connect with patients for Virtual Visits (Telemedicine).  Patients are able to view lab/test results, encounter notes, upcoming appointments, etc.  Non-urgent messages can be sent to your provider as well.   To learn more about what you can do with MyChart, go to [email or web address].   Other Instructions    Please report to Radiology at the [facility] Main Entrance 30 minutes early for your test.  [address] [place], [place] [address]  How to Prepare for Your Cardiac PET/CT Stress Test:  Nothing to eat or drink, except water, 3 hours prior to arrival time.  NO caffeine/decaffeinated products, or chocolate 12 hours prior to arrival.  (Please note decaffeinated beverages (teas/coffees) still contain caffeine).  If you have caffeine within 12 hours prior, the test will need to be rescheduled.  Medication instructions: Do not take erectile dysfunction medications for 72 hours prior to test (sildenafil, tadalafil) Do not take nitrates (isosorbide mononitrate, Ranexa) the day before or day of test Do not take tamsulosin the day before or morning of test Hold theophylline containing medications for 12 hours. Hold Dipyridamole 48 hours prior to the test.  Diabetic Preparation: If able to eat breakfast prior to 3 hour fasting, you may take all medications, including your insulin . Do not worry if you miss your breakfast dose of insulin  - start at your next meal. If you do not eat prior to 3 hour fast-Hold all diabetes (oral and insulin ) medications. Patients who wear a continuous glucose monitor MUST remove the device prior to scanning.  You may take your remaining medications with water.  NO perfume, cologne or lotion on chest or abdomen area. FEMALES - Please avoid wearing dresses to this appointment.  Total time is 1 to 2 hours; you may want to bring reading material for the waiting time.  IF YOU THINK YOU MAY BE PREGNANT, OR ARE NURSING PLEASE INFORM THE TECHNOLOGIST.  In preparation for your appointment, medication and supplies will be purchased.  Appointment availability is limited, so if you need to cancel or reschedule, please call the Radiology Department Scheduler at [phone] 24 hours in advance to avoid a cancellation fee of $100.00  What to Expect When you  Arrive:  Once you arrive and check in for your appointment, you will be taken to a preparation room within the Radiology Department.  A technologist or Nurse will obtain your medical history, verify that you are correctly prepped for the exam, and explain the procedure.  Afterwards, an IV will be started in your arm and electrodes will be placed on your skin  for EKG monitoring during the stress portion of the exam. Then you will be escorted to the PET/CT scanner.  There, staff will get you positioned on the scanner and obtain a blood pressure and EKG.  During the exam, you will continue to be connected to the EKG and blood pressure machines.  A small, safe amount of a radioactive tracer will be injected in your IV to obtain a series of pictures of your heart along with an injection of a stress agent.    After your Exam:  It is recommended that you eat a meal and drink a caffeinated beverage to counter act any effects of the stress agent.  Drink plenty of fluids for the remainder of the day and urinate frequently for the first couple of hours after the exam.  Your doctor will inform you of your test results within 7-10 business days.  For more information and frequently asked questions, please visit our website: [email or web address]  For questions about your test or how to prepare for your test, please call: Cardiac Imaging Nurse Navigators Office: [phone]

## 2023-11-26 NOTE — Progress Notes (Addendum)
 Cardiology Office Note:  .   Date:  11/26/2023  ID:  Tyler Hendricks, DOB 10-16-1950, MRN [id number] PCP: Rollene Almarie LABOR, MD  [facility] Providers Cardiologist:  Newman Lawrence, MD PCP: Rollene Almarie LABOR, MD  Chief Complaint  Patient presents with   Coronary artery disease involving native coronary artery of   Follow-up      History of Present Illness: .    Tyler Hendricks is a 73 y.o. male with hypertension, hyperlipidemia,  type 2 diabetes mellitus, CAD (multiple prior PCI's), severe aortic stenosis- now s/p TAVR (06/08/23)  pulmonary fibrosis,  hep B cirrhosis, HIV  Prior stents: OM1 3.0 x 23 mm, left posterolateral 2.5 x 13 mm, diagonal 1 2.5 x 13, ostial diagonal 2.5 mm, proximal LAD- unknown size, mid LAD 3.0 x 13  - all BMS.    Patient underwent successful TAVR in 05/2023.  However, he has continued to have exertional dyspnea.  He is seeing Dr. Theophilus for pulmonary fibrosis, recently started on Ofev . Of note, cardiac catheterization pre-TAVR showed severe diagonal stent ISR, and mild pulmonary hypertension with PASP of 31 mmHg.  On further discussion with the patient, patient does not recall having any clear chest pain symptoms even before his prior PCI several years ago.   Vitals:   11/26/23 0826  BP: 122/62  Pulse: 65  Resp: 16  SpO2: 91%      ROS:  Review of Systems  Cardiovascular:  Positive for dyspnea on exertion. Negative for chest pain, leg swelling, palpitations and syncope.  Respiratory:  Positive for cough.      Studies Reviewed: SABRA        EKG 11/26/2023: Sinus rhythm with Premature atrial complexes Right bundle branch block Left anterior fascicular block Bifascicular block Inferior infarct (cited on or before 08-Jun-2023) T wave abnormality, consider lateral ischemia When compared with ECG of 09-Jun-2023 03:45, Premature atrial complexes are now Present T wave inversion now evident in Lateral leads      Echocardiogram 06/2023:    1. 26 mm S3. Gradients are stable. Trivial PVL. The aortic valve has been  repaired/replaced. Aortic valve regurgitation is trivial. There is a 26 mm  Sapien prosthetic (TAVR) valve present in the aortic position. Procedure  Date: 06/08/2023. Echo findings are consistent with normal structure and  function of the aortic valve prosthesis. Aortic valve area, by VTI measures 1.78 cm. Aortic valve mean gradient measures 15.3 mmHg. Aortic valve Vmax measures 2.68 m/s.   2. Left ventricular ejection fraction, by estimation, is 60 to 65%. The  left ventricle has normal function. The left ventricle has no regional  wall motion abnormalities. There is mild concentric left ventricular  hypertrophy. Left ventricular diastolic  parameters are consistent with Grade I diastolic dysfunction (impaired  relaxation).   3. Right ventricular systolic function is normal. The right ventricular  size is normal. Tricuspid regurgitation signal is inadequate for assessing  PA pressure.   4. The mitral valve is grossly normal. Trivial mitral valve  regurgitation. No evidence of mitral stenosis.   5. The inferior vena cava is normal in size with greater than 50%  respiratory variability, suggesting right atrial pressure of 3 mmHg.   Coronary angiography 04/23/2023: LM: No true left main seen         Patient appears to have 2 separate ostia, one for the LAD and the other for ramus and left circumflex. LAD: Prox 30% disease          Bifurcation stents in prox  mid LAD and diag 1          LAD stent has minimal 30% late lumen loss          Ostial diag 1 stent has 90% ISR Ramus: Ostial ramus tent with no restenosis Lcx: Mid 50% stenosis RCA: Minimal luminal irregularities with mid 30% disease   Right heart catheterization 04/23/2023: RA: 2 mmHg RV: 49/1 mmHg PA: 48/20 mmHg, mPAP 31 mmHg PCW: 9 mmHg   AO sats: 97% PA sats: 80% RV sats 82% RA sats 80% PA sats rather high, but no true step up seen in shunt run    CO: 7.4 L/min CI: 3.4 L/min/m2   Multivessel coronary artery disease Moderate nonobstructive disease with patent stents, except severe ISR in ostial diag stent  US  abdomen 04/2023: Cirrhosis and splenomegaly. No focal liver lesions.    Labs 09/2023: Cr 0.68. AlKP 133, Albumin 3.1, T.bili 3.1  06/2023: Hb 16.8, platelets 84 Cr 0.68. AlKP 133, Albumin 3.1, T.bili 3.1  Labs 10/2022: Cr 0.6, K 4.0 Chol 142, TG 68, HDL 70, LDL 58 TSH 1.8 HbA1C 6.8%   Physical Exam:   Physical Exam Vitals and nursing note reviewed.  Constitutional:      General: He is not in acute distress. Neck:     Vascular: No JVD.  Cardiovascular:     Rate and Rhythm: Normal rate and regular rhythm.     Pulses:          Carotid pulses are  on the right side with bruit.    Heart sounds: Murmur heard.     High-pitched blowing holosystolic murmur is present at the apex.     High-pitched blowing holosystolic murmur of grade 2/6 is also present at the apex.  Pulmonary:     Effort: Pulmonary effort is normal.     Breath sounds: Normal breath sounds. No wheezing or rales.  Musculoskeletal:     Right lower leg: No edema.     Left lower leg: No edema.      VISIT DIAGNOSES:   ICD-10-CM   1. Coronary artery disease involving native coronary artery of native heart with other form of angina pectoris (HCC)  I25.118 EKG 12-Lead    NM PET CT CARDIAC PERFUSION MULTI W/ABSOLUTE BLOODFLOW    Cardiac Stress Test: Informed Consent Details: Physician/Practitioner Attestation; Transcribe to consent form and obtain patient signature    2. Essential hypertension  I10         ASSESSMENT AND PLAN: .    Tyler Hendricks is a 73 y.o. male with hypertension, hyperlipidemia,  type 2 diabetes mellitus, CAD (multiple prior PCI's), severe aortic stenosis- now s/p TAVR (06/08/23)  pulmonary fibrosis,  hep B cirrhosis, HIV  Aortic stenosis:  Now s/p TAVR (05/2023). No heart failure signs on exam.  Exertional dyspnea: Most  likely due to pulmonary fibrosis and likely pulmonary hypertension. Other less likely differential etiology includes CAD with severe ISR bare-metal stent in sizable diagonal branch. Continue management of pulmonary fibrosis as per Dr. Theophilus. We will obtain PET/CT stress test to evaluate for objective evidence of ischemia. If ischemia is present, it is possible that part of his shortness of breath is angina equivalent, although primarily related to pulmonary fibrosis. If there is significant ischemia noted on PET/CT stress test, we could consider a coated balloon angioplasty to old bare-metal stent.  My expectations would be realistic for improvement in his shortness of breath even with that procedure, and his pulmonary fibrosis remains most likely etiology  for shortness of breath. Continue Plavix  monotherapy, Crestor  10 mg daily. Continue amlodipine  5 mg daily, atenolol  50 mg daily.  Hypertension: Well-controlled    Informed Consent   Shared Decision Making/Informed Consent The risks [chest pain, shortness of breath, cardiac arrhythmias, dizziness, blood pressure fluctuations, myocardial infarction, stroke/transient ischemic attack, nausea, vomiting, allergic reaction, radiation exposure, metallic taste sensation and life-threatening complications (estimated to be 1 in 10,000)], benefits (risk stratification, diagnosing coronary artery disease, treatment guidance) and alternatives of a cardiac PET stress test were discussed in detail with Mr. Parish and he agrees to proceed.       F/u in 6-8 weeks  Signed, Newman JINNY Lawrence, MD

## 2023-11-30 ENCOUNTER — Other Ambulatory Visit (HOSPITAL_COMMUNITY): Payer: Self-pay

## 2023-12-07 ENCOUNTER — Ambulatory Visit: Admitting: Internal Medicine

## 2023-12-07 ENCOUNTER — Encounter: Payer: Self-pay | Admitting: Internal Medicine

## 2023-12-07 ENCOUNTER — Ambulatory Visit: Payer: Self-pay | Admitting: Internal Medicine

## 2023-12-07 VITALS — BP 120/80 | HR 61 | Temp 98.0°F | Ht 72.0 in | Wt 194.0 lb

## 2023-12-07 DIAGNOSIS — K7460 Unspecified cirrhosis of liver: Secondary | ICD-10-CM | POA: Diagnosis not present

## 2023-12-07 DIAGNOSIS — E118 Type 2 diabetes mellitus with unspecified complications: Secondary | ICD-10-CM | POA: Diagnosis not present

## 2023-12-07 DIAGNOSIS — Z7985 Long-term (current) use of injectable non-insulin antidiabetic drugs: Secondary | ICD-10-CM

## 2023-12-07 DIAGNOSIS — D696 Thrombocytopenia, unspecified: Secondary | ICD-10-CM | POA: Diagnosis not present

## 2023-12-07 DIAGNOSIS — E1169 Type 2 diabetes mellitus with other specified complication: Secondary | ICD-10-CM | POA: Diagnosis not present

## 2023-12-07 DIAGNOSIS — E785 Hyperlipidemia, unspecified: Secondary | ICD-10-CM | POA: Diagnosis not present

## 2023-12-07 DIAGNOSIS — I10 Essential (primary) hypertension: Secondary | ICD-10-CM

## 2023-12-07 DIAGNOSIS — I8510 Secondary esophageal varices without bleeding: Secondary | ICD-10-CM | POA: Diagnosis not present

## 2023-12-07 LAB — CBC
HCT: 46.9 % (ref 39.0–52.0)
Hemoglobin: 15.9 g/dL (ref 13.0–17.0)
MCHC: 33.8 g/dL (ref 30.0–36.0)
MCV: 102.9 fl — ABNORMAL HIGH (ref 78.0–100.0)
Platelets: 96 K/uL — ABNORMAL LOW (ref 150.0–400.0)
RBC: 4.56 Mil/uL (ref 4.22–5.81)
RDW: 16.8 % — ABNORMAL HIGH (ref 11.5–15.5)
WBC: 4.7 K/uL (ref 4.0–10.5)

## 2023-12-07 LAB — COMPREHENSIVE METABOLIC PANEL WITH GFR
ALT: 27 U/L (ref 0–53)
AST: 51 U/L — ABNORMAL HIGH (ref 0–37)
Albumin: 2.9 g/dL — ABNORMAL LOW (ref 3.5–5.2)
Alkaline Phosphatase: 164 U/L — ABNORMAL HIGH (ref 39–117)
BUN: 14 mg/dL (ref 6–23)
CO2: 31 meq/L (ref 19–32)
Calcium: 9.2 mg/dL (ref 8.4–10.5)
Chloride: 104 meq/L (ref 96–112)
Creatinine, Ser: 0.61 mg/dL (ref 0.40–1.50)
GFR: 95.48 mL/min (ref >60.00)
Glucose, Bld: 113 mg/dL — ABNORMAL HIGH (ref 70–99)
Potassium: 4.2 meq/L (ref 3.5–5.1)
Sodium: 140 meq/L (ref 135–145)
Total Bilirubin: 2.1 mg/dL — ABNORMAL HIGH (ref 0.2–1.2)
Total Protein: 5.8 g/dL — ABNORMAL LOW (ref 6.0–8.3)

## 2023-12-07 LAB — MICROALBUMIN / CREATININE URINE RATIO
Creatinine,U: 39.7 mg/dL
Microalb Creat Ratio: 1360 mg/g — ABNORMAL HIGH (ref 0.0–30.0)
Microalb, Ur: 54 mg/dL — ABNORMAL HIGH (ref 0.0–1.9)

## 2023-12-07 LAB — LIPID PANEL
Cholesterol: 125 mg/dL (ref 0–200)
HDL: 55.3 mg/dL (ref >39.00)
LDL Cholesterol: 59 mg/dL (ref 0–99)
NonHDL: 69.94
Total CHOL/HDL Ratio: 2
Triglycerides: 54 mg/dL (ref 0.0–149.0)
VLDL: 10.8 mg/dL (ref 0.0–40.0)

## 2023-12-07 LAB — HEMOGLOBIN A1C: Hgb A1c MFr Bld: 6.7 % — ABNORMAL HIGH (ref 4.6–6.5)

## 2023-12-07 NOTE — Patient Instructions (Signed)
 We will check the labs and plan to keep you off the lasix  for now.

## 2023-12-07 NOTE — Progress Notes (Signed)
 Subjective:   Patient ID: Tyler Hendricks, male    DOB: [birth year], 73 y.o.   MRN: [id number]  Discussed the use of AI scribe software for clinical note transcription with the patient, who gave verbal consent to proceed.  History of Present Illness Tyler Hendricks is a 73 year old male with a history of heart valve replacement and other medical problems who presents with persistent cough and leg swelling.  He has been experiencing a persistent cough that significantly impacts his daily life, often disrupting his sleep. He uses hydrocodone  cough syrup for relief, but the effect is temporary and he is seeing pulmonary for treatment.  He also experiences swelling in his legs, described as tight and stretched, especially when lifting them. He was previously on furosemide  (Lasix ) for this issue but has been out of it for about a week. While on Lasix , there was some reduction in swelling, but it did not resolve completely. He has not noticed much difference in being off this. He is currently taking losartan  hydrochlorothiazide  and mentioned taking another medication for swelling but cannot recall its name.  He experiences numbness and tingling in his toes, which has been present for quite some time. There is no progression of these symptoms beyond the toes.   Review of Systems  Constitutional: Negative.   HENT: Negative.    Eyes: Negative.   Respiratory:  Positive for cough and shortness of breath. Negative for chest tightness.   Cardiovascular:  Negative for chest pain, palpitations and leg swelling.  Gastrointestinal:  Negative for abdominal distention, abdominal pain, constipation, diarrhea, nausea and vomiting.  Musculoskeletal:  Positive for arthralgias.  Skin: Negative.   Neurological: Negative.   Psychiatric/Behavioral: Negative.      Objective:  Physical Exam Constitutional:      Appearance: He is well-developed.  HENT:     Head: Normocephalic and atraumatic.  Cardiovascular:      Rate and Rhythm: Normal rate and regular rhythm.  Pulmonary:     Effort: Pulmonary effort is normal. No respiratory distress.     Breath sounds: Normal breath sounds. No wheezing or rales.     Comments: Coughing during visit, using 3L O2 via canula during visit Abdominal:     General: Bowel sounds are normal. There is no distension.     Palpations: Abdomen is soft.     Tenderness: There is no abdominal tenderness.  Musculoskeletal:     Cervical back: Normal range of motion.  Skin:    General: Skin is warm and dry.     Comments: Foot exam done  Neurological:     Mental Status: He is alert and oriented to person, place, and time.     Coordination: Coordination normal.     Vitals:   12/07/23 1050  BP: 120/80  Pulse: 61  Temp: 98 F (36.7 C)  TempSrc: Oral  SpO2: 95%  Weight: 194 lb (88 kg)  Height: 6' (1.829 m)    Assessment and Plan Assessment & Plan Chronic cough   Chronic cough persists. Send a message to the pulmonologist for further evaluation and management. He is on chronic hycodan  cough syrup which pulmonary manages. Cardiology will conduct a stress test to assess any cardiac function relation.  Lower extremity edema   Edema persists despite furosemide , with minimal improvement after discontinuing Lasix . Will stop lasix  and continue hydrochlorothiazide /losartan  for fluid management alone.  Peripheral neuropathy, toes  associated with diabetes. Peripheral neuropathy in the toes presents with numbness but no progression.  Chronic lung  disease (pulmonary fibrosis) on Ofev    Chronic lung disease is managed with Ofev  and chronic O2 seeing pulmonary.  Hypertension   Hypertension is managed with losartan  hydrochlorothiazide , atenolol , and amlodipine .  Hyperlipidemia associated with diabetes   Hyperlipidemia associated with diabetes is managed with Crestor  and Vascepa  checking lipid panel.  Type 2 diabetes mellitus with complications   Type 2 diabetes is managed with  Synjardy  checking labs today.

## 2023-12-07 NOTE — Assessment & Plan Note (Signed)
 Type 2 diabetes is managed with Synjardy  checking labs today. Complicated by neuropathy and hyperlipidemia.

## 2023-12-07 NOTE — Assessment & Plan Note (Signed)
 Without ascites or encephalopathy. Checking CMP and CBC today. Overdue for GI follow up.

## 2023-12-07 NOTE — Assessment & Plan Note (Signed)
 Hypertension is managed with losartan  hydrochlorothiazide , atenolol , and amlodipine .

## 2023-12-07 NOTE — Assessment & Plan Note (Signed)
 Taking atenolol  and due for GI follow up. Continue atenolol  no clinical signs of bleeding.

## 2023-12-07 NOTE — Assessment & Plan Note (Signed)
Checking lipid panel and adjust lipitor as needed.  

## 2023-12-07 NOTE — Assessment & Plan Note (Signed)
 Checking CBC for stability and related to his cirrhosis.

## 2023-12-13 ENCOUNTER — Other Ambulatory Visit: Payer: Self-pay

## 2023-12-13 ENCOUNTER — Other Ambulatory Visit: Payer: Self-pay | Admitting: Pulmonary Disease

## 2023-12-13 DIAGNOSIS — R053 Chronic cough: Secondary | ICD-10-CM

## 2023-12-13 NOTE — Telephone Encounter (Signed)
 Patient requesting refill on Hycodan  Syrup.  Patient last OV 10/18/2023. Per chart note:  Chronic cough Secondary to pulmonary fibrosis.  He is getting Hycodan  cough syrup We discussed coming off Hycodan  as he is also on oxy for chronic pain and he is trying to limit use of cough medication as much as possible.  Please advise.  Thank you.

## 2023-12-13 NOTE — Progress Notes (Signed)
 Specialty Pharmacy Refill Coordination Note  Tyler Hendricks is a 73 y.o. male contacted today regarding refills of specialty medication(s) Bictegravir-Emtricitab-Tenofov (Biktarvy )   Patient requested Delivery   Delivery date: 12/14/23   Verified address: [address] [place] [address]   Medication will be filled on 09.22.25.

## 2023-12-13 NOTE — Telephone Encounter (Signed)
 Copied from CRM #[id number]. Topic: Clinical - Medication Refill >> Dec 13, 2023 10:09 AM Rilla B wrote: Medication: HYDROcodone  bit-homatropine (HYCODAN ) 5-1.5 MG/5ML syrup  Has the patient contacted their pharmacy? Yes (Agent: If no, request that the patient contact the pharmacy for the refill. If patient does not wish to contact the pharmacy document the reason why and proceed with request.) (Agent: If yes, when and what did the pharmacy advise?)  This is the patient's preferred pharmacy:  [id number] [place], [place] - [address] [place] [address] Phone: [phone] Fax: [phone]  [facility] [place], [place] - [address] [place] [address] Phone: [phone] Fax: [phone]  [facility] [address] [place], [place]  [phone]  Is this the correct pharmacy for this prescription? Yes If no, delete pharmacy and type the correct one.   Has the prescription been filled recently? Yes  Is the patient out of the medication? Yes  Has the patient been seen for an appointment in the last year OR does the patient have an upcoming appointment? Yes  Can we respond through MyChart? No  Agent: Please be advised that Rx refills may take up to 3 business days. We ask that you follow-up with your pharmacy.

## 2023-12-13 NOTE — Telephone Encounter (Signed)
 Closing message another phone message open.

## 2023-12-14 ENCOUNTER — Encounter (HOSPITAL_COMMUNITY): Payer: Self-pay | Admitting: Cardiology

## 2023-12-15 ENCOUNTER — Ambulatory Visit: Payer: Self-pay | Admitting: Pulmonary Disease

## 2023-12-15 MED ORDER — HYDROCODONE BIT-HOMATROP MBR 5-1.5 MG/5ML PO SOLN: 5.0000 mL | Freq: Four times a day (QID) | ORAL | 0 refills | Status: DC | PRN

## 2023-12-15 NOTE — Telephone Encounter (Signed)
 Refill already completed today.     Copied from CRM #[id number]. Topic: Clinical - Medication Refill >> Dec 13, 2023 10:09 AM Rilla B wrote: Medication: HYDROcodone  bit-homatropine (HYCODAN ) 5-1.5 MG/5ML syrup  Has the patient contacted their pharmacy? Yes (Agent: If no, request that the patient contact the pharmacy for the refill. If patient does not wish to contact the pharmacy document the reason why and proceed with request.) (Agent: If yes, when and what did the pharmacy advise?)  This is the patient's preferred pharmacy:  [id number] [place], [place] - [address] [facility] [address] [place] [address] Phone: [phone] Fax: [phone]  [facility] [place], [place] - [address] [place] [address] Phone: [phone] Fax: [phone]  [facility] [address] [place], [place]  [phone]  Is this the correct pharmacy for this prescription? Yes If no, delete pharmacy and type the correct one.   Has the prescription been filled recently? Yes  Is the patient out of the medication? Yes  Has the patient been seen for an appointment in the last year OR does the patient have an upcoming appointment? Yes  Can we respond through MyChart? No  Agent: Please be advised that Rx refills may take up to 3 business days. We ask that you follow-up with your pharmacy. >> Dec 15, 2023  1:04 PM Ismael A wrote: Patient states that pharmacy does not have the medication in stock - we would like it to be sent to [facility] Address: [address] [place], [place] [address] Phone: [phone] - he states he needs it asap due to cough >> Dec 14, 2023  3:17 PM Rozanna G wrote: Pt calling back about prescripton for cough medicine, advised him that it normally takes 3 business days for prescriptions to be filled. Stated he was not advised. Thanks >> Dec 13, 2023  3:00 PM Corean R  wrote: Patient is requesting an update on if and when Dr. Theophilus will order the medication .

## 2023-12-15 NOTE — Telephone Encounter (Signed)
 Patient [name]   MRN #[id number]   The office spoke with the patient two days ago about cough medication---Patient states that pharmacy does not have the medication in stock - we would like it to be sent to [facility] Address: [address], [place], [place] [address] Phone: [phone] - he states he needs it asap due to cough.    Do you all want me to call him or just forward yall this chart?  I dont mind either way   Received message from E2C2 nurse, the pharmacy that we sent Hycodan  cough syrup to is out of stock.  Pt is requesting med be sent to [facility]  I have changed the pharmacy and pended the RX. Routing back to you Dr. Theophilus

## 2023-12-15 NOTE — Telephone Encounter (Signed)
Med refill has been sent to pharmacy. 

## 2023-12-15 NOTE — Telephone Encounter (Signed)
 Pt is aware rx sent to pharmacy.  Nothing further needed.

## 2023-12-15 NOTE — Addendum Note (Signed)
 Addended byBETHA FRIES, Vicky Mccanless A on: 12/15/2023 01:24 PM   Modules accepted: Orders

## 2023-12-15 NOTE — Addendum Note (Signed)
 Addended byBETHA THEOPHILUS ROOSEVELT on: 12/15/2023 12:23 PM   Modules accepted: Orders

## 2023-12-16 ENCOUNTER — Other Ambulatory Visit (HOSPITAL_BASED_OUTPATIENT_CLINIC_OR_DEPARTMENT_OTHER): Payer: Self-pay

## 2023-12-16 ENCOUNTER — Other Ambulatory Visit: Payer: Self-pay

## 2023-12-16 ENCOUNTER — Ambulatory Visit: Payer: Self-pay | Admitting: Pulmonary Disease

## 2023-12-16 ENCOUNTER — Other Ambulatory Visit (HOSPITAL_COMMUNITY): Payer: Self-pay

## 2023-12-16 DIAGNOSIS — R053 Chronic cough: Secondary | ICD-10-CM

## 2023-12-16 MED ORDER — HYDROCODONE BIT-HOMATROP MBR 5-1.5 MG/5ML PO SOLN
5.0000 mL | Freq: Four times a day (QID) | ORAL | 0 refills | Status: DC | PRN
  Filled 2023-12-16 (×2): qty 353, 18d supply, fill #0
  Filled 2023-12-16: qty 473, 24d supply, fill #0
  Filled 2023-12-16: qty 120, 6d supply, fill #0

## 2023-12-16 NOTE — Telephone Encounter (Signed)
 FYI Only or Action Required?: Action required by provider: medication refill request and pt requesting Rx sent to different pharmacy that has Rx in stock-- see pended med.  Patient is followed in Pulmonology for ILD, last seen on 10/18/2023 by Mannam, Praveen, MD.  Called Nurse Triage reporting Medication Refill.  Symptoms began several days ago.  Triage Disposition: Call PCP Now  Patient/caregiver understands and will follow disposition?: Yes      Copied from CRM [id number]. Topic: Clinical - Prescription Issue >> Dec 16, 2023 11:04 AM Rozanna MATSU wrote: Reason for CRM: Patient called in regarding his medication HYDROcodone  bit-homatropine (HYCODAN ) 5-1.5 MG/5ML syrup. Patient states that the [facility] does not have the medication and he would now like for it to be sent to the pharmacy on file below:   [place] [facility] [address] [place] [address] Pt stated he called [facility] and they did say they had the medicine. Please call pt and advise Reason for Disposition  [1] Prescription not at pharmacy AND [2] was prescribed by doctor (or NP/PA) recently  (Exception: [facility] has access to EMR and prescription is recorded there. Go to Home Care and confirm prescription for pharmacy.)    Pt has called multiple times today. [facility] pended Rx requested with [facility] that has Rx in stock.  Answer Assessment - Initial Assessment Questions 1. DRUG NAME: What medicine do you need to have refilled?     HYDROcodone  bit-homatropine (HYCODAN ) 5-1.5 MG/5ML syrup 2. REFILLS REMAINING: How many refills are remaining? Notes: The label on the medicine or pill bottle will show how many refills are remaining. If there are no refills remaining, then a renewal may be needed.     0 3. EXPIRATION DATE: What is the expiration date? Note: The label states when the prescription will expire, and thus can no longer be refilled.)     N/a 4. PRESCRIBER: Who prescribed it?  Note: The prescribing doctor or group is responsible for refill approvals..     Mannam, Praveen, MD 5. PHARMACY: Have you contacted your pharmacy (drugstore)? Note: Some pharmacies will contact the doctor (or NP/PA).      [facility] not [facility] in stock. Pt has called [facility] that does have Rx in stock. Pt requesting Rx to be switched. P:[phone] 6. SYMPTOMS: Do you have any symptoms?     Yes, cough 7. PREGNANCY: Is there any chance that you are pregnant? When was your last menstrual period?     N/a  Protocols used: Medication Refill and Renewal Call-A-AH

## 2023-12-16 NOTE — Telephone Encounter (Signed)
 Prescription has been sent to [facility] as requested by patient

## 2023-12-17 ENCOUNTER — Other Ambulatory Visit (HOSPITAL_BASED_OUTPATIENT_CLINIC_OR_DEPARTMENT_OTHER): Payer: Self-pay

## 2023-12-17 ENCOUNTER — Other Ambulatory Visit (HOSPITAL_COMMUNITY): Payer: Self-pay

## 2023-12-17 ENCOUNTER — Other Ambulatory Visit: Payer: Self-pay

## 2024-01-03 ENCOUNTER — Other Ambulatory Visit (HOSPITAL_COMMUNITY): Payer: Self-pay

## 2024-01-03 ENCOUNTER — Other Ambulatory Visit: Payer: Self-pay

## 2024-01-03 DIAGNOSIS — Z113 Encounter for screening for infections with a predominantly sexual mode of transmission: Secondary | ICD-10-CM

## 2024-01-03 DIAGNOSIS — B20 Human immunodeficiency virus [HIV] disease: Secondary | ICD-10-CM

## 2024-01-04 ENCOUNTER — Other Ambulatory Visit: Payer: Self-pay

## 2024-01-04 ENCOUNTER — Other Ambulatory Visit

## 2024-01-04 DIAGNOSIS — Z113 Encounter for screening for infections with a predominantly sexual mode of transmission: Secondary | ICD-10-CM

## 2024-01-04 DIAGNOSIS — B20 Human immunodeficiency virus [HIV] disease: Secondary | ICD-10-CM

## 2024-01-05 ENCOUNTER — Other Ambulatory Visit: Payer: Self-pay

## 2024-01-05 ENCOUNTER — Other Ambulatory Visit: Payer: Self-pay | Admitting: Family

## 2024-01-05 DIAGNOSIS — Z23 Encounter for immunization: Secondary | ICD-10-CM | POA: Diagnosis not present

## 2024-01-05 DIAGNOSIS — B20 Human immunodeficiency virus [HIV] disease: Secondary | ICD-10-CM

## 2024-01-05 LAB — T-HELPER CELL (CD4) - (RCID CLINIC ONLY)
CD4 % Helper T Cell: 61 % (ref 33–65)
CD4 T Cell Abs: 669 /uL (ref 400–1790)

## 2024-01-05 MED ORDER — BIKTARVY 50-200-25 MG PO TABS
1.0000 | ORAL_TABLET | Freq: Every day | ORAL | 5 refills | Status: AC
  Filled 2024-01-05 – 2024-01-07 (×3): qty 30, 30d supply, fill #0
  Filled 2024-02-02: qty 30, 30d supply, fill #1
  Filled 2024-03-03 – 2024-03-06 (×2): qty 30, 30d supply, fill #2
  Filled 2024-03-30: qty 30, 30d supply, fill #3

## 2024-01-06 ENCOUNTER — Other Ambulatory Visit: Payer: Self-pay | Admitting: Internal Medicine

## 2024-01-06 DIAGNOSIS — E118 Type 2 diabetes mellitus with unspecified complications: Secondary | ICD-10-CM

## 2024-01-06 LAB — CBC WITH DIFFERENTIAL/PLATELET
Absolute Lymphocytes: 1273 {cells}/uL (ref 850–3900)
Absolute Monocytes: 350 {cells}/uL (ref 200–950)
Basophils Absolute: 80 {cells}/uL (ref 0–200)
Basophils Relative: 2.1 %
Eosinophils Absolute: 350 {cells}/uL (ref 15–500)
Eosinophils Relative: 9.2 %
HCT: 47.1 % (ref 38.5–50.0)
Hemoglobin: 15.8 g/dL (ref 13.2–17.1)
MCH: 34.4 pg — ABNORMAL HIGH (ref 27.0–33.0)
MCHC: 33.5 g/dL (ref 32.0–36.0)
MCV: 102.6 fL — ABNORMAL HIGH (ref 80.0–100.0)
MPV: 11.2 fL (ref 7.5–12.5)
Monocytes Relative: 9.2 %
Neutro Abs: 1748 {cells}/uL (ref 1500–7800)
Neutrophils Relative %: 46 %
Platelets: 93 Thousand/uL — ABNORMAL LOW (ref 140–400)
RBC: 4.59 Million/uL (ref 4.20–5.80)
RDW: 13.5 % (ref 11.0–15.0)
Total Lymphocyte: 33.5 %
WBC: 3.8 Thousand/uL (ref 3.8–10.8)

## 2024-01-06 LAB — RPR: RPR Ser Ql: NONREACTIVE

## 2024-01-06 LAB — COMPLETE METABOLIC PANEL WITHOUT GFR
AG Ratio: 1.1 (calc) (ref 1.0–2.5)
ALT: 22 U/L (ref 9–46)
AST: 44 U/L — ABNORMAL HIGH (ref 10–35)
Albumin: 2.7 g/dL — ABNORMAL LOW (ref 3.6–5.1)
Alkaline phosphatase (APISO): 169 U/L — ABNORMAL HIGH (ref 35–144)
BUN: 15 mg/dL (ref 7–25)
CO2: 33 mmol/L — ABNORMAL HIGH (ref 20–32)
Calcium: 8.8 mg/dL (ref 8.6–10.3)
Chloride: 104 mmol/L (ref 98–110)
Creat: 0.75 mg/dL (ref 0.70–1.28)
Globulin: 2.5 g/dL (ref 1.9–3.7)
Glucose, Bld: 131 mg/dL — ABNORMAL HIGH (ref 65–99)
Potassium: 4.3 mmol/L (ref 3.5–5.3)
Sodium: 140 mmol/L (ref 135–146)
Total Bilirubin: 1.8 mg/dL — ABNORMAL HIGH (ref 0.2–1.2)
Total Protein: 5.2 g/dL — ABNORMAL LOW (ref 6.1–8.1)

## 2024-01-06 LAB — HIV-1 RNA QUANT-NO REFLEX-BLD
HIV 1 RNA Quant: NOT DETECTED {copies}/mL
HIV-1 RNA Quant, Log: NOT DETECTED {Log_copies}/mL

## 2024-01-07 ENCOUNTER — Other Ambulatory Visit: Payer: Self-pay

## 2024-01-07 ENCOUNTER — Other Ambulatory Visit (HOSPITAL_COMMUNITY): Payer: Self-pay

## 2024-01-07 ENCOUNTER — Other Ambulatory Visit: Payer: Self-pay | Admitting: Internal Medicine

## 2024-01-07 DIAGNOSIS — E781 Pure hyperglyceridemia: Secondary | ICD-10-CM

## 2024-01-07 DIAGNOSIS — I2510 Atherosclerotic heart disease of native coronary artery without angina pectoris: Secondary | ICD-10-CM

## 2024-01-07 NOTE — Progress Notes (Signed)
 Specialty Pharmacy Refill Coordination Note  Tyler Hendricks is a 73 y.o. male contacted today regarding refills of specialty medication(s) Bictegravir-Emtricitab-Tenofov (Biktarvy )   Patient requested Delivery   Delivery date: 01/12/24   Verified address: [address] [name] [address]   Medication will be filled on 01/11/24.

## 2024-01-10 ENCOUNTER — Other Ambulatory Visit: Payer: Self-pay

## 2024-01-11 ENCOUNTER — Other Ambulatory Visit: Payer: Self-pay | Admitting: Pulmonary Disease

## 2024-01-11 DIAGNOSIS — R053 Chronic cough: Secondary | ICD-10-CM

## 2024-01-11 MED ORDER — HYDROCODONE BIT-HOMATROP MBR 5-1.5 MG/5ML PO SOLN: 5.0000 mL | Freq: Four times a day (QID) | ORAL | 0 refills | Status: DC | PRN

## 2024-01-11 NOTE — Telephone Encounter (Unsigned)
 Copied from CRM #[id number]. Topic: Clinical - Medication Refill >> Jan 11, 2024 10:20 AM Rozanna MATSU wrote: Medication: Rx #: [id number]  HYDROcodone  bit-homatropine (HYCODAN ) 5-1.5 MG/5ML syrup [[id number]]    Has the patient contacted their pharmacy? Yes (Agent: If no, request that the patient contact the pharmacy for the refill. If patient does not wish to contact the pharmacy document the reason why and proceed with request.) (Agent: If yes, when and what did the pharmacy advise?)  This is the patient's preferred pharmacy:  [id number] [place], [place] - [address] [place] [address] Phone: [phone] Fax: [phone]   Is this the correct pharmacy for this prescription? Yes If no, delete pharmacy and type the correct one.   Has the prescription been filled recently? Yes  Is the patient out of the medication? No  Has the patient been seen for an appointment in the last year OR does the patient have an upcoming appointment? Yes  Can we respond through MyChart? No  Agent: Please be advised that Rx refills may take up to 3 business days. We ask that you follow-up with your pharmacy.

## 2024-01-17 ENCOUNTER — Encounter (HOSPITAL_COMMUNITY): Payer: Self-pay

## 2024-01-18 ENCOUNTER — Ambulatory Visit: Payer: Self-pay | Admitting: Family

## 2024-01-18 ENCOUNTER — Encounter: Payer: Self-pay | Admitting: Family

## 2024-01-18 ENCOUNTER — Other Ambulatory Visit: Payer: Self-pay

## 2024-01-18 VITALS — BP 157/91 | HR 80 | Temp 98.0°F | Wt 204.0 lb

## 2024-01-18 DIAGNOSIS — B20 Human immunodeficiency virus [HIV] disease: Secondary | ICD-10-CM

## 2024-01-18 DIAGNOSIS — Z0000 Encounter for general adult medical examination without abnormal findings: Secondary | ICD-10-CM | POA: Diagnosis not present

## 2024-01-18 DIAGNOSIS — J849 Interstitial pulmonary disease, unspecified: Secondary | ICD-10-CM | POA: Diagnosis not present

## 2024-01-18 NOTE — Patient Instructions (Addendum)
 Nice to see you.  Continue to take your medication daily as prescribed.  Refills have been sent to the pharmacy.  Plan for follow up in 6 months or sooner if needed with lab work 1-2 weeks prior to appointment.   Have a great day and stay safe!

## 2024-01-18 NOTE — Assessment & Plan Note (Signed)
 Vaccinations reviewed and up-to-date. Routine dental care up-to-date. Colon cancer screening up-to-date.

## 2024-01-18 NOTE — Progress Notes (Signed)
 Brief Narrative   Patient ID: Tyler Hendricks, male    DOB: 1951/02/05, 73 y.o.   MRN: [id number]  Tyler Hendricks is a 73 y/o caucasian male diagnosed with HIV disease in 1989 with risk factor being IV drug use. Initial viral load is unknown with CD4 nadir of 510. No history of opportunistic infection. [id number] negative. Previous ART history with Zerit/Stauvadine-Epivir, Atripla, Complera, Odefsey  and currently Biktarvy .     Subjective:   Chief Complaint  Patient presents with   Follow-up    HPI:  Tyler Hendricks is a 73 y.o. male with HIV disease last seen on 07/16/2023 with well-controlled virus and good adherence and tolerance to Biktarvy .  Viral load was undetectable with CD4 count 757.  Kidney function, liver function, electrolytes within normal ranges.  Most recent blood work completed on 01/04/2024 with viral load that remains undetectable and CD4 count 669.  Kidney function, liver function, electrolytes within normal ranges.  Red blood cell size remains consistent with macrocytosis.  Here today for routine follow-up.  Tyler Hendricks has been doing okay since his last office visit and continues to take Biktarvy  as prescribed with no adverse side effects or problems obtaining medication from pharmacy.  Covered by Medicare.  Has had increased use of oxygen  now wearing it nearly 24 hours a day.  Increased levels of fatigue at times.  Has questions about receiving RSV vaccine.  Family concerned about memory and forgetfulness.  Appears to have gradual decline over the past 3 months.  Housing, transportation, access to food are stable.  Healthcare maintenance reviewed.   Denies fevers, chills, night sweats, headaches, changes in vision, neck pain/stiffness, nausea, diarrhea, vomiting, lesions or rashes.  Lab Results  Component Value Date   CD4TCELL 61 01/04/2024   CD4TABS 669 01/04/2024   Lab Results  Component Value Date   HIV1RNAQUANT NOT DETECTED 01/04/2024     Allergies  Allergen Reactions    Integrilin [Eptifibatide] Other (See Comments)    Bleeding (non-specific)   Lopressor [Metoprolol Tartrate] Rash and Other (See Comments)    Bleeding (non-specific)    Penicillins Diarrhea and Rash      Outpatient Medications Prior to Visit  Medication Sig Dispense Refill   amLODipine  (NORVASC ) 5 MG tablet Take 1 tablet (5 mg total) by mouth daily.     atenolol  (TENORMIN ) 50 MG tablet TAKE 1 TABLET BY MOUTH EVERY DAY 90 tablet 1   bictegravir-emtricitabine -tenofovir  AF (BIKTARVY ) 50-200-25 MG TABS tablet Take 1 tablet by mouth daily. 30 tablet 5   cholecalciferol (VITAMIN D3) 25 MCG (1000 UNIT) tablet Take 1,000 Units by mouth daily.     clopidogrel  (PLAVIX ) 75 MG tablet TAKE 1 TABLET BY MOUTH EVERY DAY 90 tablet 3   HYDROcodone  bit-homatropine (HYCODAN ) 5-1.5 MG/5ML syrup Take 5 mLs by mouth every 6 (six) hours as needed (chronic cough - pulmonary fibrosis). 473 mL 0   icosapent  Ethyl (VASCEPA ) 1 g capsule TAKE 2 CAPSULES BY MOUTH 2 TIMES DAILY. 360 capsule 0   losartan -hydrochlorothiazide  (HYZAAR ) 100-25 MG tablet TAKE 1 TABLET BY MOUTH EVERY DAY 90 tablet 3   Multiple Vitamin (MULTIVITAMIN) tablet Take 1 tablet by mouth daily.     OFEV  100 MG CAPS TAKE 1 CAPSULE BY MOUTH 2 TIMES A DAY WITH FOOD. TAKE 12 HOURS APART. 180 capsule 1   Oxycodone  HCl 10 MG TABS Take 10 mg by mouth 3 (three) times daily as needed (Pain). (Patient taking differently: Take 10 mg by mouth 4 (four) times daily as needed (  Pain).)     SYNJARDY  XR 12.07-998 MG TB24 TAKE 2 TABLETS BY MOUTH EVERY DAY 180 tablet 0   rosuvastatin  (CRESTOR ) 10 MG tablet Take 1 tablet (10 mg total) by mouth daily. 90 tablet 3   No facility-administered medications prior to visit.     Past Medical History:  Diagnosis Date   CAD (coronary artery disease)    Diabetes mellitus without complication (HCC)    Essential (primary) hypertension    Gallstones    Hepatic cirrhosis (HCC)    Hepatitis B core antibody positive 01/08/2019    Hepatitis C    HIV infection (HCC)    Hyperlipidemia    Hypertension    Internal hemorrhoids    Kidney stones    Pulmonary fibrosis (HCC)    S/P TAVR (transcatheter aortic valve replacement) 06/08/2023   s/p TAVR with a 26mm Edwards S3UR via the TF approach by Dr. Verlin & Dr. Maryjane   Severe aortic stenosis    Sleep apnea    severe, on CPAP   Tubular adenoma of colon      Past Surgical History:  Procedure Laterality Date   CARDIAC CATHETERIZATION Left 04/2013   chlecystectomy     CHOLECYSTECTOMY     COLONOSCOPY  05/02/2013   GAS/FLUID EXCHANGE Right 12/30/2021   Procedure: GAS/FLUID EXCHANGE;  Surgeon: Tobie Baptist, MD;  Location: [facility] OR;  Service: Ophthalmology;  Laterality: Right;  SF6   INTRAOPERATIVE TRANSTHORACIC ECHOCARDIOGRAM N/A 06/08/2023   Procedure: ECHOCARDIOGRAM, TRANSTHORACIC;  Surgeon: Verlin Lonni BIRCH, MD;  Location: [facility] INVASIVE [facility] LAB;  Service: Cardiovascular;  Laterality: N/A;   PARS PLANA VITRECTOMY Right 12/30/2021   Procedure: PARS PLANA VITRECTOMY WITH 25 GAUGE;  Surgeon: Tobie Baptist, MD;  Location: [facility] OR;  Service: Ophthalmology;  Laterality: Right;   PERCUTANEOUS CORONARY STENT INTERVENTION (PCI-S)     PHOTOCOAGULATION WITH LASER Right 12/30/2021   Procedure: PHOTOCOAGULATION WITH LASER;  Surgeon: Tobie Baptist, MD;  Location: [facility] OR;  Service: Ophthalmology;  Laterality: Right;   POLYPECTOMY     RIGHT/LEFT HEART CATH AND CORONARY ANGIOGRAPHY N/A 04/23/2023   Procedure: RIGHT/LEFT HEART CATH AND CORONARY ANGIOGRAPHY;  Surgeon: Elmira Newman PARAS, MD;  Location: [facility] INVASIVE CV LAB;  Service: Cardiovascular;  Laterality: N/A;   UMBILICAL HERNIA REPAIR          Review of Systems  Constitutional:  Negative for appetite change, chills, fatigue, fever and unexpected weight change.  Eyes:  Negative for visual disturbance.  Respiratory:  Negative for cough, chest tightness, shortness of breath and wheezing.   Cardiovascular:  Negative  for chest pain and leg swelling.  Gastrointestinal:  Negative for abdominal pain, constipation, diarrhea, nausea and vomiting.  Genitourinary:  Negative for dysuria, flank pain, frequency, genital sores, hematuria and urgency.  Skin:  Negative for rash.  Allergic/Immunologic: Negative for immunocompromised state.  Neurological:  Negative for dizziness and headaches.     Objective:   BP (!) 157/91   Pulse 80   Temp 98 F (36.7 C) (Oral)   Wt 204 lb (92.5 kg)   SpO2 (!) 89%   BMI 27.67 kg/m  Nursing note and vital signs reviewed.  Physical Exam Constitutional:      General: He is not in acute distress.    Appearance: He is well-developed.     Interventions: Nasal cannula in place.  Eyes:     Conjunctiva/sclera: Conjunctivae normal.  Cardiovascular:     Rate and Rhythm: Normal rate and regular rhythm.     Heart sounds: Normal  heart sounds. No murmur heard.    No friction rub. No gallop.  Pulmonary:     Effort: Pulmonary effort is normal. No respiratory distress.     Breath sounds: Normal breath sounds. No wheezing or rales.  Chest:     Chest wall: No tenderness.  Abdominal:     General: Bowel sounds are normal.     Palpations: Abdomen is soft.     Tenderness: There is no abdominal tenderness.  Musculoskeletal:     Cervical back: Neck supple.  Lymphadenopathy:     Cervical: No cervical adenopathy.  Skin:    General: Skin is warm and dry.     Findings: No rash.  Neurological:     Mental Status: He is alert and oriented to person, place, and time.  Psychiatric:        Behavior: Behavior normal.        Thought Content: Thought content normal.        Judgment: Judgment normal.          12/07/2023   10:57 AM 09/22/2023    9:44 AM 07/22/2023    1:56 PM 07/16/2023   10:40 AM 04/14/2023   10:36 AM  Depression screen PHQ 2/9  Decreased Interest 0 0 0 0 0  Down, Depressed, Hopeless 0 0 0 0 0  PHQ - 2 Score 0 0 0 0 0  Altered sleeping   1 0 0  Tired, decreased energy    0 0 0  Change in appetite   0 0 0  Feeling bad or failure about yourself    0 0 0  Trouble concentrating   0 0 0  Moving slowly or fidgety/restless   0 0 0  Suicidal thoughts   0 0 0  PHQ-9 Score   1 0 0  Difficult doing work/chores   Not difficult at all Somewhat difficult Not difficult at all        07/16/2023   10:41 AM  GAD 7 : Generalized Anxiety Score  Nervous, Anxious, on Edge 0  Control/stop worrying 0  Worry too much - different things 0  Trouble relaxing 0  Restless 0  Easily annoyed or irritable 0  Afraid - awful might happen 0  Total GAD 7 Score 0  Anxiety Difficulty Somewhat difficult     The ASCVD Risk score (Arnett DK, et al., 2019) failed to calculate for the following reasons:   The valid total cholesterol range is 130 to 320 mg/dL      Assessment & Plan:    Patient Active Problem List   Diagnosis Date Noted   Exertional dyspnea 10/27/2023   Other secondary pulmonary hypertension (HCC) 10/27/2023   Infected sebaceous cyst 09/22/2023   At increased risk for cardiovascular disease 07/16/2023   S/P TAVR (transcatheter aortic valve replacement) 06/08/2023   Acute on chronic heart failure with preserved ejection fraction (HFpEF) (HCC) 06/08/2023   Hepatitis C    Pain of finger of left hand 01/01/2023   Pain due to onychomycosis of toenails of both feet 10/08/2022   Degeneration of lumbar intervertebral disc 02/18/2022   Long-term current use of opiate analgesic 06/11/2020   Lumbar pain 04/24/2020   Degenerative lumbar spinal stenosis 04/24/2020   Scoliosis deformity of spine 04/24/2020   Neural foraminal stenosis of cervical spine 01/03/2020   Chronic chest wall pain 11/16/2019   Hyperlipidemia associated with type 2 diabetes mellitus (HCC) 11/15/2019   Benign prostatic hyperplasia without lower urinary tract symptoms 11/15/2019  Interstitial pulmonary disease (HCC) 07/18/2019   Chronic bilateral low back pain without sciatica 03/02/2019   Muscle  cramps 03/02/2019   Hepatic cirrhosis (HCC) 01/08/2019   Secondary esophageal varices without bleeding (HCC) 01/08/2019   Intestinal metaplasia of gastric mucosa 01/08/2019   Chronic cough 06/24/2018   Healthcare maintenance 05/11/2018   Hx of colonic polyp 04/27/2018   Ventral hernia without obstruction or gangrene 04/27/2018   Thrombocytopenia 04/27/2018   Hypersplenism 04/27/2018   Coronary artery disease 02/25/2018   Obstructive sleep apnea 10/25/2017   Extrinsic asthma without complication 08/25/2017   Vitamin D  deficiency 08/25/2017   HIV disease (HCC) 04/15/2017   Primary osteoarthritis of both knees 04/15/2017   Type 2 diabetes with complication (HCC) 04/15/2017   Essential hypertension 04/15/2017     Problem List Items Addressed This Visit       Respiratory   Interstitial pulmonary disease (HCC)   Now wearing oxygen  almost 24 hours a day and gets fatigued with activity.  Pulmonology following.        Other   HIV disease (HCC) - Primary   Mr. Cunnington continues to have well-controlled virus with good adherence and tolerance to Biktarvy .  Reviewed lab work and discussed plan of care and U equals U.  No problems obtaining medication from the pharmacy and covered by Medicare.  Social determinants of health reviewed with no interventions indicated.  Continue current dose of Biktarvy .  Plan for follow-up in 6 months or sooner if needed with lab work 1 to 2 weeks prior to appointment.      Healthcare maintenance   Vaccinations reviewed and up-to-date. Routine dental care up-to-date. Colon cancer screening up-to-date.        I am having Tyler Hendricks maintain his multivitamin, Oxycodone  HCl, rosuvastatin , clopidogrel , cholecalciferol, Ofev , atenolol , amLODipine , losartan -hydrochlorothiazide , Biktarvy , Synjardy  XR, icosapent  Ethyl, and HYDROcodone  bit-homatropine.   Follow-up: Return in about 6 months (around 07/18/2024). or sooner if needed.    Cathlyn July, MSN,  FNP-C Nurse Practitioner [facility] Main number: [phone]

## 2024-01-18 NOTE — Assessment & Plan Note (Addendum)
 Now wearing oxygen  almost 24 hours a day and gets fatigued with activity. Pulmonology following.

## 2024-01-18 NOTE — Assessment & Plan Note (Signed)
 [name] continues to have well-controlled virus with good adherence and tolerance to Biktarvy .  Reviewed lab work and discussed plan of care and U equals U.  No problems obtaining medication from the pharmacy and covered by Medicare.  Social determinants of health reviewed with no interventions indicated.  Continue current dose of Biktarvy .  Plan for follow-up in 6 months or sooner if needed with lab work 1 to 2 weeks prior to appointment.

## 2024-01-19 ENCOUNTER — Encounter (HOSPITAL_COMMUNITY): Payer: Self-pay

## 2024-01-19 ENCOUNTER — Ambulatory Visit (HOSPITAL_COMMUNITY): Admission: RE | Admit: 2024-01-19 | Source: Ambulatory Visit

## 2024-01-20 ENCOUNTER — Other Ambulatory Visit: Payer: Self-pay | Admitting: Internal Medicine

## 2024-01-24 ENCOUNTER — Ambulatory Visit: Admitting: Podiatry

## 2024-01-24 ENCOUNTER — Encounter: Payer: Self-pay | Admitting: Podiatry

## 2024-01-24 DIAGNOSIS — M79674 Pain in right toe(s): Secondary | ICD-10-CM

## 2024-01-24 DIAGNOSIS — E118 Type 2 diabetes mellitus with unspecified complications: Secondary | ICD-10-CM | POA: Diagnosis not present

## 2024-01-24 DIAGNOSIS — B351 Tinea unguium: Secondary | ICD-10-CM | POA: Diagnosis not present

## 2024-01-24 DIAGNOSIS — M79675 Pain in left toe(s): Secondary | ICD-10-CM

## 2024-01-24 NOTE — Progress Notes (Signed)
 This patient returns to my office for at risk foot care.  This patient requires this care by a professional since this patient will be at risk due to having diabetes and HIV.  This patient is unable to cut nails himself since the patient cannot reach his nails.These nails are painful walking and wearing shoes.  This patient presents for at risk foot care today.  General Appearance  Alert, conversant and in no acute stress.  Vascular  Dorsalis pedis and posterior tibial  pulses are palpable  bilaterally.  Capillary return is within normal limits  bilaterally. Temperature is within normal limits  bilaterally.  Neurologic  Senn-Weinstein monofilament wire test within normal limits  bilaterally. Muscle power within normal limits bilaterally.  Nails Thick disfigured discolored nails with subungual debris  hallux nails bilaterally. No evidence of bacterial infection or drainage bilaterally.  Orthopedic  No limitations of motion  feet .  No crepitus or effusions noted.  No bony pathology or digital deformities noted.  Skin  normotropic skin with no porokeratosis noted bilaterally.  No signs of infections or ulcers noted.     Onychomycosis  Pain in right toes  Pain in left toes  Consent was obtained for treatment procedures.   Mechanical debridement of nails 1-5  bilaterally performed with a nail nipper.  Filed with dremel without incident.    Return office visit   3 months                   Told patient to return for periodic foot care and evaluation due to potential at risk complications.   Cordella Bold DPM

## 2024-01-25 ENCOUNTER — Other Ambulatory Visit: Payer: Self-pay | Admitting: Internal Medicine

## 2024-01-31 ENCOUNTER — Telehealth: Payer: Self-pay | Admitting: *Deleted

## 2024-01-31 ENCOUNTER — Ambulatory Visit: Admitting: Pulmonary Disease

## 2024-01-31 ENCOUNTER — Ambulatory Visit: Payer: Self-pay | Admitting: Pulmonary Disease

## 2024-01-31 DIAGNOSIS — G4733 Obstructive sleep apnea (adult) (pediatric): Secondary | ICD-10-CM

## 2024-01-31 DIAGNOSIS — J84112 Idiopathic pulmonary fibrosis: Secondary | ICD-10-CM | POA: Diagnosis not present

## 2024-01-31 DIAGNOSIS — R053 Chronic cough: Secondary | ICD-10-CM | POA: Diagnosis not present

## 2024-01-31 DIAGNOSIS — K7460 Unspecified cirrhosis of liver: Secondary | ICD-10-CM | POA: Diagnosis not present

## 2024-01-31 DIAGNOSIS — R634 Abnormal weight loss: Secondary | ICD-10-CM | POA: Diagnosis not present

## 2024-01-31 DIAGNOSIS — Z87891 Personal history of nicotine dependence: Secondary | ICD-10-CM | POA: Diagnosis not present

## 2024-01-31 MED ORDER — PANTOPRAZOLE SODIUM 40 MG PO TBEC: 40.0000 mg | DELAYED_RELEASE_TABLET | Freq: Every day | ORAL | 3 refills | Status: AC

## 2024-01-31 MED ORDER — BENZONATATE 200 MG PO CAPS: 200.0000 mg | ORAL_CAPSULE | Freq: Three times a day (TID) | ORAL | 1 refills | Status: DC | PRN

## 2024-01-31 MED ORDER — HYDROCODONE BIT-HOMATROP MBR 5-1.5 MG/5ML PO SOLN: 5.0000 mL | ORAL | 0 refills | Status: DC | PRN

## 2024-01-31 NOTE — Telephone Encounter (Signed)
 1st attempt to contact patient, no answer, routing for additional attempts.      Reason for Triage: Patient is experiencing a cough thats getting worse & has alot of phlegm.   Callback number: [phone]

## 2024-01-31 NOTE — Telephone Encounter (Signed)
 FYI Only or Action Required?: FYI only for provider: appointment scheduled on 01/31/2024 at 3 PM.  Patient is followed in Pulmonology for pulmonary fibrosis, last seen on 10/18/2023 by Mannam, Praveen, MD.  Called Nurse Triage reporting Shortness of Breath and Cough.  Symptoms began several weeks ago.  Interventions attempted: Home oxygen  use and Increased fluids/rest.  Symptoms are: unchanged.  Triage Disposition: See HCP Within 4 Hours (Or PCP Triage)  Patient/caregiver understands and will follow disposition?: Yes  E2C2 Pulmonary Triage - Initial Assessment Questions "Chief Complaint (e.g., cough, sob, wheezing, fever, chills, sweat or additional symptoms) *Go to specific symptom protocol after initial questions. Patient reporting productive cough that he says is worse than normal. Reports shortness of breath-moderate. No wheezing, fever or chills. Patient initially says no to appointment with pulmonary today at 3 PM. Patient states I can't get there. Patient states he is wanting his cough medication refilled. Educated patient on the importance of being seen in person with his symptoms having worsened. After verifying that patient doesn't have an upcoming appointment with pulmonary, patient agrees to appointment with pulmonary today at 3 PM.  "How long have symptoms been present?" Started about a month ago.   Have you tested for COVID or Flu? Note: If not, ask patient if a home test can be taken. If so, instruct patient to call back for positive results. No  MEDICINES:   "Have you used any OTC meds to help with symptoms?" No If yes, ask "What medications?"   "Have you used your inhalers/maintenance medication?" No If yes, "What medications?"   If inhaler, ask "How many puffs and how often?" Note: Review instructions on medication in the chart.   OXYGEN : "Do you wear supplemental oxygen ?" Yes If yes, "How many liters are you supposed to use?" 3L  "Do you monitor your  oxygen  levels?" Yes If yes, What is your reading (oxygen  level) today? 89-90%  What is your usual oxygen  saturation reading?  (Note: Pulmonary O2 sats should be 90% or greater) 89-90%   Reason for Triage: Patient is experiencing a cough thats getting worse & has alot of phlegm.    Callback number: [phone]    Reason for Disposition  [1] Longstanding difficulty breathing (e.g., CHF, COPD, emphysema) AND [2] WORSE than normal  Answer Assessment - Initial Assessment Questions 6. CARDIAC HISTORY: Do you have any history of heart disease? (e.g., heart attack, angina, bypass surgery, angioplasty)      yes 7. LUNG HISTORY: Do you have any history of lung disease?  (e.g., pulmonary embolus, asthma, emphysema)     yes      12. TRAVEL: Have you traveled out of the country in the last month? (e.g., travel history, exposures)       no  Protocols used: Breathing Difficulty-A-AH

## 2024-01-31 NOTE — Telephone Encounter (Signed)
 Please advise patient currentyl taking Hycodan  for cough. Patient [phone] wants to be seen sooner with Dr. Theophilus, coughing phlgem clear is getting worse especially when talking and keeping him up at night. Patient denies shortness of breath, wheezing, pain, nor fever. Patient wants to be seen next week. Informed patient, Dr. Theophilus does not have an appointment for next week and next availability is 02/2024. Patient states will call back on Monday and hung up the call. Copied from CRM #[id number]. Topic: Appointments - Appointment Scheduling >> Jan 28, 2024  3:43 PM Chantha C wrote: Patient/patient representative is calling to schedule an appointment. Refer to attachments for appointment information.

## 2024-01-31 NOTE — Patient Instructions (Addendum)
  VISIT SUMMARY: During your visit, we discussed your worsening cough and shortness of breath, which are likely due to your idiopathic pulmonary fibrosis. We reviewed your current medications and made some adjustments to help manage your symptoms.  YOUR PLAN: IDIOPATHIC PULMONARY FIBROSIS: Your chronic lung condition is causing a persistent cough, shortness of breath, and weight loss. -Continue taking Ofev  (nintedanib) 100 mg daily as you have been. -We discussed the possibility of switching to Jascayd or Esbriet, but you decided to stay on Ofev  due to concerns about side effects.  COUGH: Your cough has worsened and is causing significant discomfort, especially at night. -Start taking Protonix  to see if acid reflux is contributing to your cough. -Begin using Tessalon  Pearls to help suppress your cough. -Your prescription for Hycodin has been renewed for continued cough management. -All prescriptions have been sent to the pharmacy on [address].  SHORTNESS OF BREATH: You are experiencing increased shortness of breath, particularly with exertion. -Continue using supplemental oxygen  as needed to help with your breathing.  WEIGHT LOSS: You have been losing weight, likely due to decreased appetite from your lung condition. -Continue supplementing your diet with Ensure shakes to help maintain your weight.                                    Contains text generated by [employer].

## 2024-01-31 NOTE — Progress Notes (Addendum)
 Tyler Hendricks    [id number]    08-12-1950  Primary Care Physician:Crawford, Almarie LABOR, MD  Referring Physician: Rollene Almarie LABOR, MD [address] [place],  [place] [address]  Problem list: Follow-up for idiopathic pulmonary fibrosis Started on Ofev  in late June 2022.   HPI: 73 y.o.  with history of coronary artery disease, HIV, hepatitis C, cirrhosis Complains of chronic cough for the past several years. He has paroxysms of cough associated with dyspnea. Mild chest congestion History notable for COVID-19 infection in December 2020. He had very mild symptoms and did not require hospitalization.  CT scan with probable UIP pattern pulmonary fibrosis. He has been referred to the ILD clinic for further evaluation.  Started on Ofev  in 2022 for diagnosis of IPF  Interim history: Discussed the use of AI scribe software for clinical note transcription with the patient, who gave verbal consent to proceed.  History of Present Illness Tyler Hendricks is a 73 year old male with idiopathic pulmonary fibrosis who presents with worsening cough and shortness of breath.  Cough - Persistent cough worsened over the past two months - Described as feeling like 'being punched in the chest' and more intense than previous episodes - Productive of significant amount of clear mucus, especially at night - Cough disrupts sleep - No associated sinus issues or congestion - Currently taking cough medication and requires a refill  Dyspnea - Increased shortness of breath, particularly with exertion such as walking outside - Requires supplemental oxygen  for assistance with breathing  Idiopathic pulmonary fibrosis management - On Ofev  (nintedanib) 100 mg since 2021 - No reported side effects from Ofev  - Loss of appetite present, supplementing diet with Ensure shakes  Recent cardiac surgery - Underwent heart valve replacement a few months ago - Cough predates cardiac  procedure  Gastrointestinal symptoms - No diarrhea - Cautious about medications that may cause diarrhea   Relevant pulmonary history Pets: Has a dog. No cats, birds, farm animals Occupation: Retired psychologist, forensic Exposures: No known exposures. No mold, hot tub, Jacuzzi. No down pillows or comforters ILD questionnaire 10/03/2019-negative Smoking history: 60-pack-year smoker. Quit in late 1990s Travel history: Originally from [place] . Moved to [place]  in 2018 Relevant family history: Mother had emphysema. She was a heavy smoker.  Outpatient Encounter Medications as of 01/31/2024  Medication Sig   amLODipine  (NORVASC ) 5 MG tablet Take 1 tablet (5 mg total) by mouth daily.   atenolol  (TENORMIN ) 50 MG tablet TAKE 1 TABLET BY MOUTH EVERY DAY   bictegravir-emtricitabine -tenofovir  AF (BIKTARVY ) 50-200-25 MG TABS tablet Take 1 tablet by mouth daily.   cholecalciferol (VITAMIN D3) 25 MCG (1000 UNIT) tablet Take 1,000 Units by mouth daily.   clopidogrel  (PLAVIX ) 75 MG tablet TAKE 1 TABLET BY MOUTH EVERY DAY   HYDROcodone  bit-homatropine (HYCODAN ) 5-1.5 MG/5ML syrup Take 5 mLs by mouth every 6 (six) hours as needed (chronic cough - pulmonary fibrosis).   icosapent  Ethyl (VASCEPA ) 1 g capsule TAKE 2 CAPSULES BY MOUTH 2 TIMES DAILY.   losartan -hydrochlorothiazide  (HYZAAR ) 100-25 MG tablet TAKE 1 TABLET BY MOUTH EVERY DAY   Multiple Vitamin (MULTIVITAMIN) tablet Take 1 tablet by mouth daily.   OFEV  100 MG CAPS TAKE 1 CAPSULE BY MOUTH 2 TIMES A DAY WITH FOOD. TAKE 12 HOURS APART.   Oxycodone  HCl 10 MG TABS Take 10 mg by mouth 3 (three) times daily as needed (Pain). (Patient taking differently: Take 10 mg by mouth 4 (four)  times daily as needed (Pain).)   rosuvastatin  (CRESTOR ) 10 MG tablet TAKE 1 TABLET BY MOUTH EVERY DAY   SYNJARDY  XR 12.07-998 MG TB24 TAKE 2 TABLETS BY MOUTH EVERY DAY   No facility-administered encounter medications on file as of 01/31/2024.   Vitals:    01/31/24 1505 01/31/24 1542  BP: (!) 141/85 (!) 140/91  Pulse: 61   Temp: 97.7 F (36.5 C)   Height: 6' (1.829 m)   Weight: 203 lb (92.1 kg)   SpO2: (!) 89% Comment: Ro9om air   TempSrc: Oral   BMI (Calculated): 27.53      Physical Exam GEN: No acute distress. CV: Regular rate and rhythm, no murmurs. LUNGS: Crackles heard due to lung scarring, otherwise clear to auscultation bilaterally with normal respiratory effort. SKIN JOINTS: Warm and dry, no rash.    Data Reviewed: Imaging: High-res CT 07/13/2019-groundglass attenuation with septal thickening, cylindrical bronchiectasis with mild basal gradient Probable UIP pattern High-res CT 06/24/2020-stable pulmonary fibrosis and probable UIP pattern High-res CT 04/21/2022-stable pattern of probable UIP pattern pulmonary fibrosis High-res CT 03/25/2023- progression of probable UIP pattern pulmonary fibrosis CT coronaries 05/07/2023-redemonstration of pulmonary fibrosis and probable UIP pattern I have reviewed the images personally  PFTs: 09/01/2019 FVC 3.78 [78%], FEV1 3.14 [87%], F/F 83, TLC 6.40 [86%], DLCO 22.76 [81%] Normal test  06/28/2020 FVC 3.70 [71%], FEV1 3.05 [85%], F/F 82, TLC 5.76 [79%], DLCO 19.32 [69%] Minimal restriction, mild diffusion defect  07/22/2021 FVC 3.70 [1 7%], FEV1 3.01 [85%], F/F 81, TLC 5.84 [78%], DLCO 15.89 [57%] Minimal restriction, moderate diffusion defect  04/17/2022 FVC 3.69 [77%], FEV1 2.86 [82%], F/F78, TLC 5.49 [73%], DLCO 14.25 [52%] Mild restriction, moderate diffusion defect  03/25/2023 FVC 3.61 [76%], FEV1 2.89 [83%], F/F80, TLC 4.62 (2%], DLCO 15.22 [55%] Mild restriction, moderate diffusion defect  Labs: CTD serologies 08/30/2019-positive for ANA 1:40, nuclear, speckled  Hepatic panel 12/30/2021-significant for AST 42, total bilirubin 4.3  Sleep: CPAP download 11/25/2019-2% compliance  Cardiac: Echocardiogram  09/06/2020-LVEF 60 to 65%, normal RV systolic size and function, TAPSE 1.5  cm 07/12/2023-LVEF 60-65%, grade 1 diastolic dysfunction, normal RV systolic size and function, TAPSE 3.2 cm   Assessment & Plan Idiopathic pulmonary fibrosis with chronic cough, dyspnea, and weight loss Has probable UIP pattern on imaging There is no symptoms of connective tissue disease. Borderline positive ANA is likely nonspecific He did have Covid in December 2020 but it was a very mild case with no hospitalization, besides his symptoms preceded the Covid infection  He likely has IPF based on CT appearance in male smoker. Discussed further work-up such as bronchoscopy, lung biopsy but he wants to avoid invasive procedures After multiple discussions informed decision making with the patient we have started treatment with Ofev  which is tolerating well so far.  We need to be cautious given history of cirrhosis  Chronic cough, dyspnea, and weight loss likely due to idiopathic pulmonary fibrosis (IPF). Cough is severe, causing chest pain, and is more intense at night. Dyspnea is present, especially when walking. Weight loss is attributed to decreased appetite, possibly related to chronic lung disease. Currently on Ofev  (nintedanib) 100 mg since 2021, with no side effects reported. Discussed potential switch to Jascayd or Esbriet, but he prefers to continue current medication due to concerns about side effects such as diarrhea. Gabapentin  was considered for neurogenic cough but he declined due to potential GI side effects. Protonix  considered for potential acid reflux contributing to cough. Tessalon  Pearls considered as an alternative for cough suppression. -  Continue Ofev  100 mg daily. - Prescribed Protonix  for potential acid reflux. - Prescribed Tessalon  Pearls for cough suppression. - Renewed Hycodan  prescription for cough management.   Weight Loss Noted loss of appetite and weight, potentially due to Ofev  and disease progression. -Encouraged high calorie, protein rich diet. -Over-the-counter  supplementation with Ensure.  Severe OSA Noncompliant with CPAP.  Encouraged him to use it daily.  Health maintenance Up-to-date with Covid booster and flu vaccine   Plan/Recommendations: Continue Ofev  Labs for monitoring Protonix  for acid reflux Tessalon  Perles Cough medication  I personally spent a total of 43 minutes in the care of the patient today including preparing to see the patient, getting/reviewing separately obtained history, referring and communicating with other health care professionals, documenting clinical information in the EHR, independently interpreting results, and communicating results.   Lonna Coder MD [facility] 01/31/2024, 3:14 PM  CC: Rollene Almarie LABOR, *

## 2024-01-31 NOTE — Telephone Encounter (Signed)
 Pt is scheduled to see Dr Theophilus today. NFN

## 2024-02-02 ENCOUNTER — Other Ambulatory Visit: Payer: Self-pay

## 2024-02-02 NOTE — Telephone Encounter (Signed)
 Patient was seen in clinic 01/31/24.Tyler Hendricks

## 2024-02-02 NOTE — Progress Notes (Signed)
 Specialty Pharmacy Ongoing Clinical Assessment Note  Tyler Hendricks is a 73 y.o. male who is being followed by the specialty pharmacy service for RxSp HIV   Patient's specialty medication(s) reviewed today: Bictegravir-Emtricitab-Tenofov (Biktarvy )   Missed doses in the last 4 weeks: 0   Patient/Caregiver did not have any additional questions or concerns.   Therapeutic benefit summary: Patient is achieving benefit   Adverse events/side effects summary: No adverse events/side effects   Patient's therapy is appropriate to: Continue    Goals Addressed             This Visit's Progress    Achieve Undetectable HIV Viral Load < 20   On track    Patient is on track. Patient will maintain adherence         Follow up: 12 months  Leaner Morici M Caterine Mcmeans Specialty Pharmacist

## 2024-02-02 NOTE — Progress Notes (Signed)
 Specialty Pharmacy Refill Coordination Note  Tyler Hendricks is a 73 y.o. male contacted today regarding refills of specialty medication(s) Bictegravir-Emtricitab-Tenofov (Biktarvy )   Patient requested Delivery   Delivery date: 02/11/24   Verified address: [address] [place] [address]   Medication will be filled on: 02/10/24

## 2024-02-03 ENCOUNTER — Other Ambulatory Visit: Payer: Self-pay | Admitting: Pulmonary Disease

## 2024-02-03 DIAGNOSIS — J84112 Idiopathic pulmonary fibrosis: Secondary | ICD-10-CM

## 2024-02-03 NOTE — Telephone Encounter (Signed)
 Pt requesting refill of specialty medication - routing to Rx team to advise.

## 2024-02-04 NOTE — Telephone Encounter (Signed)
 Refill sent for OFEV  to [facility]  Dose: 100mg  by mouth twice daily   Last OV: 01/31/24 Provider: Dr. Theophilus Pertinent labs: LFTs 01/31/24 stable from previous  Next OV: 05/01/24   Tyler Hendricks, PharmD, BCPS Clinical Pharmacist  [facility]

## 2024-02-10 ENCOUNTER — Other Ambulatory Visit: Payer: Self-pay

## 2024-02-14 ENCOUNTER — Encounter (HOSPITAL_COMMUNITY): Payer: Self-pay

## 2024-02-15 DIAGNOSIS — J8410 Pulmonary fibrosis, unspecified: Secondary | ICD-10-CM | POA: Diagnosis not present

## 2024-02-15 DIAGNOSIS — R413 Other amnesia: Secondary | ICD-10-CM | POA: Diagnosis not present

## 2024-02-15 DIAGNOSIS — M5416 Radiculopathy, lumbar region: Secondary | ICD-10-CM | POA: Diagnosis not present

## 2024-02-15 DIAGNOSIS — Z79899 Other long term (current) drug therapy: Secondary | ICD-10-CM | POA: Diagnosis not present

## 2024-02-16 ENCOUNTER — Ambulatory Visit (HOSPITAL_COMMUNITY)
Admission: RE | Admit: 2024-02-16 | Discharge: 2024-02-16 | Disposition: A | Discharge status: Home / Self Care | Source: Ambulatory Visit | Attending: Cardiology | Admitting: Cardiology

## 2024-02-16 ENCOUNTER — Other Ambulatory Visit (HOSPITAL_COMMUNITY): Payer: Self-pay | Admitting: Cardiology

## 2024-02-16 DIAGNOSIS — I25118 Atherosclerotic heart disease of native coronary artery with other forms of angina pectoris: Secondary | ICD-10-CM | POA: Insufficient documentation

## 2024-02-16 MED ORDER — REGADENOSON 0.4 MG/5ML IV SOLN
INTRAVENOUS | Status: AC
Start: 2024-02-16 — End: 2024-02-16
  Filled 2024-02-16: qty 5

## 2024-02-16 MED ORDER — RUBIDIUM RB82 GENERATOR (RUBYFILL)
24.0100 | PACK | Freq: Once | INTRAVENOUS | Status: AC
  Administered 2024-02-16: 24.01 via INTRAVENOUS

## 2024-02-16 MED ORDER — RUBIDIUM RB82 GENERATOR (RUBYFILL)
24.1600 | PACK | Freq: Once | INTRAVENOUS | Status: AC
  Administered 2024-02-16: 24.16 via INTRAVENOUS

## 2024-02-16 MED ORDER — REGADENOSON 0.4 MG/5ML IV SOLN
0.4000 mg | Freq: Once | INTRAVENOUS | Status: AC
  Administered 2024-02-16: 0.4 mg via INTRAVENOUS

## 2024-02-22 ENCOUNTER — Ambulatory Visit: Payer: Self-pay | Admitting: Cardiology

## 2024-02-22 LAB — NM PET CT CARDIAC PERFUSION MULTI W/ABSOLUTE BLOODFLOW
LV dias vol: 146 mL (ref 62–150)
LV sys vol: 70 mL (ref 4.2–5.8)
MBFR: 2.42
Nuc Rest EF: 52 %
Nuc Stress EF: 58 %
Rest MBF: 0.98 ml/g/min
Rest Nuclear Isotope Dose: 24.2 mCi
SDS: 5
SRS: 2
SSS: 7
ST Depression (mm): 0 mm
Stress MBF: 2.37 ml/g/min
Stress Nuclear Isotope Dose: 24 mCi

## 2024-02-22 NOTE — Progress Notes (Signed)
 Mild ischemia consistent with diagonal artery stent narrowing, but overall symptoms of cough and dyspnea more likely related to pulmonary fibrosis. Continue current medical management.. Non urgent f/u in next 2-3 months, unless any severe chest pain symptoms occur before then.  Thanks MJP

## 2024-02-26 ENCOUNTER — Encounter: Payer: Self-pay | Admitting: Internal Medicine

## 2024-03-01 ENCOUNTER — Other Ambulatory Visit (HOSPITAL_COMMUNITY): Payer: Self-pay

## 2024-03-01 NOTE — Progress Notes (Signed)
Looks like appointment was scheduled

## 2024-03-02 ENCOUNTER — Ambulatory Visit: Payer: Self-pay | Admitting: Pulmonary Disease

## 2024-03-02 DIAGNOSIS — R053 Chronic cough: Secondary | ICD-10-CM

## 2024-03-02 NOTE — Telephone Encounter (Signed)
 Dr. Theophilus, Patient is requesting a refill of the Hycodan  cough medication.  Please advise.  Thank you.

## 2024-03-02 NOTE — Telephone Encounter (Signed)
 FYI Only or Action Required?: Action required by provider: medication refill request.  Patient is followed in Pulmonology for Chronic Cough, last seen on 01/31/2024 by Mannam, Praveen, MD.  Called Nurse Triage reporting Cough.  Symptoms began on going.  Interventions attempted: Nothing.  Symptoms are: stable.  Triage Disposition: See PCP Within 2 Weeks  Patient/caregiver understands and will follow disposition?: Yes  Reason for Disposition  Cough lasts > 3 weeks  Answer Assessment - Initial Assessment Questions Patient reports he's calling in for a refill HYDROcodone  bit-homatropine (HYCODAN ) 5-1.5 MG/5ML syrup.   1. ONSET: When did the cough begin?      Pulm Fibrosis - chonric  2. SEVERITY: How bad is the cough today?      Worsening a little  3. SPUTUM: Describe the color of your sputum (e.g., none, dry cough; clear, white, yellow, green)     Normal amount, clear  4. HEMOPTYSIS: Are you coughing up any blood? If so ask: How much? (e.g., flecks, streaks, tablespoons, etc.)     Denies  5. DIFFICULTY BREATHING: Are you having difficulty breathing? If Yes, ask: How bad is it? (e.g., mild, moderate, severe)      Denies  6. FEVER: Do you have a fever? If Yes, ask: What is your temperature, how was it measured, and when did it start?     Denies  7. CARDIAC HISTORY: Do you have any history of heart disease? (e.g., heart attack, congestive heart failure)      Denies  8. LUNG HISTORY: Do you have any history of lung disease?  (e.g., pulmonary embolus, asthma, emphysema)     Pulmonary Fibrosis  9. OTHER SYMPTOMS: Do you have any other symptoms? (e.g., runny nose, wheezing, chest pain)       Denies  Protocols used: Cough - Chronic-A-AH  Copied from CRM #[id number]. Topic: Clinical - Red Word Triage >> Mar 02, 2024  8:23 AM Corean SAUNDERS wrote: Red Word that prompted transfer to Nurse Triage: Worsening cough  Seeking cough medication refill.

## 2024-03-03 ENCOUNTER — Other Ambulatory Visit (HOSPITAL_COMMUNITY): Payer: Self-pay

## 2024-03-03 ENCOUNTER — Ambulatory Visit: Payer: Self-pay

## 2024-03-03 ENCOUNTER — Ambulatory Visit: Payer: Self-pay | Admitting: Pulmonary Disease

## 2024-03-03 NOTE — Telephone Encounter (Signed)
° °  FYI Only or Action Required?: FYI only for provider: appointment scheduled on 03/06/2024.  Patient was last seen in primary care on 12/07/2023 by Rollene Almarie LABOR, MD.  Called Nurse Triage reporting Leg Swelling and Memory Loss.  Symptoms began a week ago.  Interventions attempted: Rest, hydration, or home remedies.  Symptoms are: unchanged.  Triage Disposition: See Physician Within 24 Hours  Patient/caregiver understands and will follow disposition?: Yes Copied from CRM #[id number]. Topic: Clinical - Red Word Triage >> Mar 03, 2024  9:58 AM Larissa RAMAN wrote: [facility] that prompted transfer to Nurse Triage: swelling in both legs, confusion Reason for Disposition  [1] MODERATE leg swelling (e.g., swelling extends up to knees) AND [2] new-onset or getting worse  Answer Assessment - Initial Assessment Questions 1. ONSET: When did the swelling start? (e.g., minutes, hours, days)     One week ago, leg swelling began to worsen 2. LOCATION: What part of the leg is swollen?  Are both legs swollen or just one leg?     Bilateral legs, up to knees 3. SEVERITY: How bad is the swelling? (e.g., localized; mild, moderate, severe)     moderate 4. REDNESS: Is there redness or signs of infection?     denies 5. PAIN: Is the swelling painful to touch? If Yes, ask: How painful is it?   (Scale 1-10; mild, moderate or severe)     denies 6. FEVER: Do you have a fever? If Yes, ask: What is it, how was it measured, and when did it start?      denies  9. RECURRENT SYMPTOM: Have you had leg swelling before? If Yes, ask: When was the last time? What happened that time?     Yes, received lasix  10. OTHER SYMPTOMS: Do you have any other symptoms? (e.g., chest pain, difficulty breathing)       Underlying lung disease that causes SOB with activity  Protocols used: Leg Swelling and Edema-A-AH

## 2024-03-03 NOTE — Telephone Encounter (Signed)
° °  FYI Only or Action Required?: Action required by provider: medication refill request.  Patient was last seen in primary care on 12/07/2023 by Tyler Almarie LABOR, MD.  Called Nurse Triage reporting No chief complaint on file..  Symptoms began several days ago.    Symptoms are: gradually worsening.  Triage Disposition: No triage- already triage 12/11,12/12  Patient/caregiver understands and will follow disposition?: yes   Copied from CRM #[id number]. Topic: Clinical - Prescription Issue >> Mar 03, 2024  3:53 PM Tyler Hendricks wrote: Reason for CRM: Patient has been waiting for 2 day for Dr. Theophilus to fill his cough syrup medication and called this morning to follow up and still no response.  Patient is calling for follow up for medication request. Office is presently closed. Request has been pended for provider review - but not addressed. Have called supervisor to see if there is anything else that can be done on our end- she will follow up.  Attempted to call patient back- left message: Unable to get response until Monday regarding medication refill- please be seen at ED if symptoms get worse/SOB.

## 2024-03-03 NOTE — Telephone Encounter (Signed)
 Patient states he's still coughing up clear mucus, sob not worse, no chest pain, on 3 liters of home oxygen , And wants a prescription of Hydrocodone  sent to his pharmacy.

## 2024-03-06 ENCOUNTER — Ambulatory Visit: Admitting: Internal Medicine

## 2024-03-06 ENCOUNTER — Encounter: Payer: Self-pay | Admitting: Internal Medicine

## 2024-03-06 ENCOUNTER — Telehealth: Payer: Self-pay | Admitting: *Deleted

## 2024-03-06 ENCOUNTER — Other Ambulatory Visit (HOSPITAL_COMMUNITY): Payer: Self-pay

## 2024-03-06 VITALS — BP 136/80 | HR 68 | Temp 97.6°F | Ht 72.0 in | Wt 193.4 lb

## 2024-03-06 DIAGNOSIS — I8510 Secondary esophageal varices without bleeding: Secondary | ICD-10-CM

## 2024-03-06 DIAGNOSIS — J849 Interstitial pulmonary disease, unspecified: Secondary | ICD-10-CM | POA: Diagnosis not present

## 2024-03-06 DIAGNOSIS — I5033 Acute on chronic diastolic (congestive) heart failure: Secondary | ICD-10-CM

## 2024-03-06 DIAGNOSIS — J9611 Chronic respiratory failure with hypoxia: Secondary | ICD-10-CM | POA: Diagnosis not present

## 2024-03-06 DIAGNOSIS — Z79891 Long term (current) use of opiate analgesic: Secondary | ICD-10-CM

## 2024-03-06 DIAGNOSIS — K769 Liver disease, unspecified: Secondary | ICD-10-CM | POA: Diagnosis not present

## 2024-03-06 DIAGNOSIS — K7460 Unspecified cirrhosis of liver: Secondary | ICD-10-CM | POA: Diagnosis not present

## 2024-03-06 DIAGNOSIS — R4189 Other symptoms and signs involving cognitive functions and awareness: Secondary | ICD-10-CM | POA: Diagnosis not present

## 2024-03-06 MED ORDER — FUROSEMIDE 20 MG PO TABS: 20.0000 mg | ORAL_TABLET | Freq: Every day | ORAL | 3 refills | Status: AC | PRN

## 2024-03-06 NOTE — Telephone Encounter (Unsigned)
 Copied from CRM #[id number]. Topic: Clinical - Red Word Triage >> Mar 03, 2024 11:59 AM Corean SAUNDERS wrote: Patient is requesting a update on the cough medicine, requesting it be sent to:  [id number] [place], [place] - [address]  [address] [place] [address]  Phone: [phone] Fax: [phone]

## 2024-03-06 NOTE — Telephone Encounter (Signed)
 Copied from CRM #[id number]. Topic: Clinical - Prescription Issue >> Mar 03, 2024  3:12 PM Rilla B wrote: Reason for CRM: Patient calling to see if his prescription for Hydrocodone  cough syrup was sent to pharmacy. I do see [facility] note inquiring.SABRASABRAPatient seems frustrated and wants the medication sent over. Please call patient and update on status @ [phone]   ----------------------------------------------------------------------- From previous Reason for Contact - Other: Reason for CRM:

## 2024-03-06 NOTE — Progress Notes (Unsigned)
 Subjective:   Patient ID: Tyler Hendricks, male    DOB: 09-27-1950, 73 y.o.   MRN: [id number]  Discussed the use of AI scribe software for clinical note transcription with the patient, who gave verbal consent to proceed.  History of Present Illness Tyler Hendricks is a 73 year old male with pulmonary fibrosis and cirrhosis who presents with leg swelling and breathing difficulties.  He has been experiencing leg swelling for the past couple of weeks. The swelling is persistent throughout the day without significant fluctuation and is not associated with pain.  He has been experiencing difficulty breathing, particularly when walking, necessitating rest after short distances. This has been more noticeable over the past couple of weeks. He attributes some of the breathing difficulties to cold air and has been less active due to not feeling well and the weather conditions.  He has a history of pulmonary fibrosis and uses oxygen  therapy. Removing his oxygen  leads to a rapid drop in oxygen  saturation, causing confusion and forgetfulness. He does take the oxygen  off from time to time which his wife can notice when he does. He has been experiencing memory issues, which have improved slightly, but he still requires assistance with medication management to avoid dosing errors.He is taking hydrocodone  cough syrup. He is prescribed about 15 mL daily of this (about 15 mg hydrocodone ) and does not typically measure but just drinks as much as he feels appropriate. His wife notices change in mentation at times around this. He is also taking oxycodone  10 mg BID and she has recently asked the prescriber of this to lower dose and it has been changed to 5 mg. He has confusion when taking oxycodone  and hydrocodone  too close together. Then he will sleep. When he wakes up he usually asks right away for the oxycodone .  He recently stopped pantoprazole  due to stomach discomfort, which resolved after discontinuation. No new  gastrointestinal symptoms such as diarrhea, constipation, or blood in the stool. He mentions previous stomach swelling and distention.  Review of Systems  Constitutional:  Positive for activity change and fatigue. Negative for chills and fever.  HENT: Negative.    Eyes: Negative.   Respiratory:  Positive for cough and shortness of breath. Negative for chest tightness.   Cardiovascular:  Positive for leg swelling. Negative for chest pain and palpitations.  Gastrointestinal:  Positive for abdominal distention. Negative for abdominal pain, constipation, diarrhea, nausea and vomiting.  Musculoskeletal:  Positive for arthralgias and back pain.  Skin: Negative.   Neurological:  Positive for weakness.  Psychiatric/Behavioral:  Positive for confusion and decreased concentration.     Objective:  Physical Exam Constitutional:      Appearance: He is well-developed.  HENT:     Head: Normocephalic and atraumatic.  Cardiovascular:     Rate and Rhythm: Normal rate and regular rhythm.  Pulmonary:     Effort: Pulmonary effort is normal. No respiratory distress.     Breath sounds: Normal breath sounds. No wheezing or rales.  Abdominal:     General: Bowel sounds are normal. There is distension.     Palpations: Abdomen is soft.     Tenderness: There is no abdominal tenderness.     Comments: Abdomen distention noted without tenderness  Musculoskeletal:     Cervical back: Normal range of motion.     Right lower leg: Edema present.     Left lower leg: Edema present.     Comments: 2+ hard edema bilaterally to thighs  Skin:  General: Skin is warm and dry.  Neurological:     Mental Status: He is alert and oriented to person, place, and time.     Coordination: Coordination normal.     Vitals:   03/06/24 1425  BP: 136/80  Pulse: 68  Temp: 97.6 F (36.4 C)  TempSrc: Oral  SpO2: 97%  Weight: 193 lb 6.4 oz (87.7 kg)  Height: 6' (1.829 m)    Assessment and Plan Assessment & Plan Cirrhosis  of liver with hepatic lesion   Cirrhosis with a 3.2 cm hepatic lesion. Previous MRI was canceled by patient due to claustrophobia. Noted on CT scan 04/2023 and not evaluated since. An ultrasound is ordered to assess the lesion. He is not on diuretics and is having leg and abdomen swelling. No signs of peritonitis today. No clear signs of encephalopathy however mentation can change throughout the day per wife. Referral to hepatology as he is overdue for GI follow up.   Pulmonary fibrosis with chronic respiratory failure   Chronic respiratory failure due to pulmonary fibrosis. He experiences increased dyspnea and fatigue with exertion, with significant oxygen  desaturation affecting cognition. Continue supplemental oxygen  therapy. I do have serious concerns about usage of hydrocodone  cough syrup with concurrent oxycodone  usage and that he is not using appropriately. Will let him pulmonary provider know so they can consider weaning.   Cognitive impairment due to medications and hypoxemia   Cognitive impairment likely from opioid use and intermittent hypoxemia. Confusion and memory issues are worsened by increased medication doses. Hydrocodone  and oxycodone  may contribute, especially with liver function changes. Consider reducing or discontinuing hydrocodone  or oxycodone  to mitigate cognitive side effects. Ensure proper medication management to prevent dosing errors. He has been taking the hydrocodone  however he feels like and I would guess he typically runs out every month before this is due. I have asked them to strongly consider only 1 opioid medication. I have also encouraged they need to start measuring hydrocodone  cough syrup as he could be taking dangerous amounts at one time. Discussed Ct head as never any brain imaging and he declines at this time. We talked about acute and chronic hypoxic changes.

## 2024-03-06 NOTE — Patient Instructions (Signed)
 We have sent in lasix  (furosemide ) to take 1 pill daily to take as needed for fluid.  We will get the ultrasound to check the liver and will have the GI doctor get in touch with you.

## 2024-03-07 ENCOUNTER — Other Ambulatory Visit: Payer: Self-pay

## 2024-03-07 DIAGNOSIS — R4189 Other symptoms and signs involving cognitive functions and awareness: Secondary | ICD-10-CM | POA: Insufficient documentation

## 2024-03-07 DIAGNOSIS — J9611 Chronic respiratory failure with hypoxia: Secondary | ICD-10-CM | POA: Insufficient documentation

## 2024-03-07 MED ORDER — HYDROCODONE BIT-HOMATROP MBR 5-1.5 MG/5ML PO SOLN: 5.0000 mL | Freq: Four times a day (QID) | ORAL | 0 refills | Status: DC | PRN

## 2024-03-07 NOTE — Assessment & Plan Note (Signed)
 Cirrhosis with a 3.2 cm hepatic lesion. Previous MRI was canceled by patient due to claustrophobia. Noted on CT scan 04/2023 and not evaluated since. An ultrasound is ordered to assess the lesion. Concerning given concurrent cirrhosis.

## 2024-03-07 NOTE — Telephone Encounter (Signed)
 Copied from CRM #[id number]. Topic: Clinical - Prescription Issue >> Mar 07, 2024  9:24 AM Benton KIDD wrote: Patient is calling back concerning his prescription HYDROcodone  bit-homatropine (HYCODAN ) 5-1.5 MG/5ML syrup being filled . Its cough syrup . Patient has called several times and is in need of prescription . Please reach out to patient with a update on the fill of the cough syrup [phone]   Awaiting Dr. Theophilus response.

## 2024-03-07 NOTE — Assessment & Plan Note (Signed)
 Cognitive impairment likely from opioid use and intermittent hypoxemia. Confusion and memory issues are worsened by increased medication doses. Hydrocodone  and oxycodone  may contribute, especially with liver function changes. Consider reducing or discontinuing hydrocodone  or oxycodone  to mitigate cognitive side effects. Ensure proper medication management to prevent dosing errors. He has been taking the hydrocodone  however he feels like and I would guess he typically runs out every month before this is due. I have asked them to strongly consider only 1 opioid medication. I have also encouraged they need to start measuring hydrocodone  cough syrup as he could be taking dangerous amounts at one time. Discussed Ct head as never any brain imaging and he declines at this time. We talked about acute and chronic hypoxic changes.

## 2024-03-07 NOTE — Telephone Encounter (Signed)
 Prescription for cough syrup sent in today. Patient informed. Nfn

## 2024-03-07 NOTE — Telephone Encounter (Signed)
Prescription has been refilled.

## 2024-03-07 NOTE — Telephone Encounter (Signed)
 Patient aware.NFN

## 2024-03-07 NOTE — Assessment & Plan Note (Signed)
 Is taking atenolol  and overdue for follow up. Referral to hepatology for monitoring.

## 2024-03-07 NOTE — Assessment & Plan Note (Signed)
 Cirrhosis with a 3.2 cm hepatic lesion. Previous MRI was canceled by patient due to claustrophobia. Noted on CT scan 04/2023 and not evaluated since. An ultrasound is ordered to assess the lesion. He is not on diuretics and is having leg and abdomen swelling. No signs of peritonitis today. No clear signs of encephalopathy however mentation can change throughout the day per wife. Referral to hepatology as he is overdue for GI follow up.

## 2024-03-07 NOTE — Assessment & Plan Note (Signed)
 Chronic respiratory failure due to pulmonary fibrosis. He experiences increased dyspnea and fatigue with exertion, with significant oxygen  desaturation affecting cognition. He is encouraged to use oxygen  all the time. He did remove the oxygen  several times during the visit today. Continue supplemental oxygen  therapy. I do have serious concerns about usage of hydrocodone  cough syrup with concurrent oxycodone  usage and that he is not using appropriately. Will let him pulmonary provider know so they can consider weaning.

## 2024-03-07 NOTE — Progress Notes (Signed)
 Clinical Intervention Note  Clinical Intervention Notes: Patient reported starting furosemide  once daily as needed for fluid collection. No DDIs identified with Biktarvy .   Clinical Intervention Outcomes: Prevention of an adverse drug event   [employer]

## 2024-03-07 NOTE — Assessment & Plan Note (Signed)
 Confusion and memory issues are worsened by increased medication doses. Hydrocodone  and oxycodone  may contribute, especially with liver function changes. Consider reducing or discontinuing hydrocodone  or oxycodone  to mitigate cognitive side effects. Ensure proper medication management to prevent dosing errors. He has been taking the hydrocodone  however he feels like and I would guess he typically runs out every month before this is due. I have asked them to strongly consider only 1 opioid medication. I have also encouraged they need to start measuring hydrocodone  cough syrup as he could be taking dangerous amounts at one time.

## 2024-03-07 NOTE — Assessment & Plan Note (Signed)
 With swelling in legs and abdomen and some worsening SOB. Add lasix  20 mg daily and needs follow up labs in 2-4 weeks. He is on ARB.

## 2024-03-07 NOTE — Assessment & Plan Note (Signed)
 Chronic respiratory failure due to pulmonary fibrosis. He experiences increased dyspnea and fatigue with exertion, with significant oxygen  desaturation affecting cognition. Continue supplemental oxygen  therapy. I do have serious concerns about usage of hydrocodone  cough syrup with concurrent oxycodone  usage and that he is not using appropriately. Will let him pulmonary provider know so they can consider weaning.

## 2024-03-07 NOTE — Progress Notes (Signed)
 Specialty Pharmacy Refill Coordination Note  Tyler Hendricks is a 73 y.o. male contacted today regarding refills of specialty medication(s) Bictegravir-Emtricitab-Tenofov (Biktarvy )   Patient requested (Patient-Rptd) Delivery   Delivery date: 03/13/24   Verified address: (Patient-Rptd) [address] [place] [address]   Medication will be filled on: 03/10/24

## 2024-03-10 ENCOUNTER — Other Ambulatory Visit: Payer: Self-pay

## 2024-03-28 ENCOUNTER — Other Ambulatory Visit

## 2024-03-28 ENCOUNTER — Other Ambulatory Visit: Payer: Self-pay | Admitting: Pulmonary Disease

## 2024-03-28 NOTE — Telephone Encounter (Unsigned)
 Copied from CRM [id number]. Topic: Clinical - Medication Refill >> Mar 28, 2024 11:01 AM Rozanna MATSU wrote: Medication: HYDROcodone  bit-homatropine (HYCODAN ) 5-1.5 MG/5ML syrup  Has the patient contacted their pharmacy? No (Agent: If no, request that the patient contact the pharmacy for the refill. If patient does not wish to contact the pharmacy document the reason why and proceed with request.) (Agent: If yes, when and what did the pharmacy advise?)  This is the patient's preferred pharmacy:  [id number] [place], [place] - [address] [facility] [address] [address] [place] [address] Phone: [phone] Fax: [phone]     Is this the correct pharmacy for this prescription? Yes If no, delete pharmacy and type the correct one.   Has the prescription been filled recently? Yes  Is the patient out of the medication? Yes  Has the patient been seen for an appointment in the last year OR does the patient have an upcoming appointment? Yes  Can we respond through MyChart? Yes  Agent: Please be advised that Rx refills may take up to 3 business days. We ask that you follow-up with your pharmacy.

## 2024-03-29 ENCOUNTER — Ambulatory Visit
Admission: RE | Admit: 2024-03-29 | Discharge: 2024-03-29 | Disposition: A | Discharge status: Home / Self Care | Source: Ambulatory Visit | Attending: Internal Medicine | Admitting: Internal Medicine

## 2024-03-29 ENCOUNTER — Ambulatory Visit
Admission: RE | Admit: 2024-03-29 | Discharge: 2024-03-29 | Disposition: A | Discharge status: Home / Self Care | Source: Ambulatory Visit | Attending: Pulmonary Disease | Admitting: Pulmonary Disease

## 2024-03-29 DIAGNOSIS — K769 Liver disease, unspecified: Secondary | ICD-10-CM

## 2024-03-29 DIAGNOSIS — K7460 Unspecified cirrhosis of liver: Secondary | ICD-10-CM

## 2024-03-29 DIAGNOSIS — J84112 Idiopathic pulmonary fibrosis: Secondary | ICD-10-CM

## 2024-03-29 NOTE — Telephone Encounter (Signed)
 Pt requesting Hycodan  refill. Please advise, thank you!  LOV: 01/31/24 Last Fill: 03/07/24

## 2024-03-30 ENCOUNTER — Other Ambulatory Visit (HOSPITAL_COMMUNITY): Payer: Self-pay

## 2024-03-30 ENCOUNTER — Other Ambulatory Visit: Payer: Self-pay

## 2024-03-30 ENCOUNTER — Other Ambulatory Visit: Payer: Self-pay | Admitting: Pharmacy Technician

## 2024-03-30 MED ORDER — HYDROCODONE BIT-HOMATROP MBR 5-1.5 MG/5ML PO SOLN: 5.0000 mL | Freq: Four times a day (QID) | ORAL | 0 refills | Status: DC | PRN

## 2024-03-30 NOTE — Telephone Encounter (Signed)
 Copied from CRM [id number]. Topic: Clinical - Medication Refill >> Mar 28, 2024 11:01 AM Rozanna MATSU wrote: Medication: HYDROcodone  bit-homatropine (HYCODAN ) 5-1.5 MG/5ML syrup  Has the patient contacted their pharmacy? No (Agent: If no, request that the patient contact the pharmacy for the refill. If patient does not wish to contact the pharmacy document the reason why and proceed with request.) (Agent: If yes, when and what did the pharmacy advise?)  This is the patient's preferred pharmacy:  [id number] [place], [place] - [address] [facility] [address] [place] [address] Phone: [phone] Fax: [phone]     Is this the correct pharmacy for this prescription? Yes If no, delete pharmacy and type the correct one.   Has the prescription been filled recently? Yes  Is the patient out of the medication? Yes  Has the patient been seen for an appointment in the last year OR does the patient have an upcoming appointment? Yes  Can we respond through MyChart? Yes  Agent: Please be advised that Rx refills may take up to 3 business days. We ask that you follow-up with your pharmacy. >> Mar 29, 2024  3:58 PM Chantha C wrote: Patient is checking if HYDROcodone  bit-homatropine (HYCODAN ) 5-1.5 MG/5ML syrup was refilled, patient called yesterday. Informed patient, that Rx refills may take up to 3 business days and we ask that you follow-up with your pharmacy. Patient states he knows all that, but insisted another message be sent that patient is waiting for the refill. Please advise and call back [phone].    Awaiting Dr. Theophilus reponse

## 2024-03-30 NOTE — Telephone Encounter (Signed)
 Copied from CRM #[id number]. Topic: Clinical - Prescription Issue >> Mar 30, 2024 11:17 AM Dedra NOVAK wrote: Reason for CRM: Patient called earlier regarding HYDROcodone  bit-homatropine (HYCODAN ) 5-1.5 MG/5ML syrup. Informed him that it was sent to pharmacy this morning. Patient is now calling back saying [facility].  Rx sent 1/8 & received by pharmacy. Tried calling the pharmacy and they was closed for lunch.  Will call back later

## 2024-03-30 NOTE — Progress Notes (Signed)
 Specialty Pharmacy Refill Coordination Note  Tyler Hendricks is a 74 y.o. male contacted today regarding refills of specialty medication(s) Bictegravir-Emtricitab-Tenofov (Biktarvy )   Patient requested (Patient-Rptd) Delivery   Delivery date: 04/11/24   Verified address: [address] [place] [address]   Medication will be filled on: 04/10/24

## 2024-04-03 ENCOUNTER — Telehealth: Payer: Self-pay

## 2024-04-03 ENCOUNTER — Ambulatory Visit: Payer: Self-pay | Admitting: Internal Medicine

## 2024-04-03 DIAGNOSIS — K769 Liver disease, unspecified: Secondary | ICD-10-CM

## 2024-04-03 DIAGNOSIS — K7460 Unspecified cirrhosis of liver: Secondary | ICD-10-CM

## 2024-04-03 NOTE — Telephone Encounter (Signed)
 Copied from CRM [id number]. Topic: Clinical - Lab/Test Results >> Apr 03, 2024  9:25 AM Benton KIDD wrote: Reason for CRM: tiffany with [facility] radiology calling to see did provider receive results . The results are in patients chart  Called; radiology department was unsure what tiffany called. Will have her call me back shortly.

## 2024-04-03 NOTE — Telephone Encounter (Signed)
 Copied from CRM #[id number]. Topic: Clinical - Request for Lab/Test Order >> Apr 03, 2024  2:57 PM Jasmin G wrote: Reason for CRM: [facility] called stating that recent request sent needs to be resent as a liver or abdomen protocol. Call back at [phone] ext [phone] if needed.

## 2024-04-04 ENCOUNTER — Other Ambulatory Visit: Payer: Self-pay | Admitting: Nurse Practitioner

## 2024-04-04 ENCOUNTER — Other Ambulatory Visit (HOSPITAL_COMMUNITY): Payer: Self-pay

## 2024-04-04 DIAGNOSIS — D376 Neoplasm of uncertain behavior of liver, gallbladder and bile ducts: Secondary | ICD-10-CM

## 2024-04-04 DIAGNOSIS — K7469 Other cirrhosis of liver: Secondary | ICD-10-CM

## 2024-04-05 ENCOUNTER — Other Ambulatory Visit: Payer: Self-pay | Admitting: Internal Medicine

## 2024-04-05 ENCOUNTER — Other Ambulatory Visit: Payer: Self-pay

## 2024-04-05 DIAGNOSIS — E781 Pure hyperglyceridemia: Secondary | ICD-10-CM

## 2024-04-05 DIAGNOSIS — I2510 Atherosclerotic heart disease of native coronary artery without angina pectoris: Secondary | ICD-10-CM

## 2024-04-10 ENCOUNTER — Telehealth: Payer: Self-pay

## 2024-04-10 NOTE — Telephone Encounter (Unsigned)
 Copied from CRM [id number]. Topic: Clinical - Medication Refill >> Apr 10, 2024 12:57 PM Dedra B wrote: Medication:  benzonatate  (TESSALON ) 200 MG capsule  HYDROcodone  bit-homatropine (HYCODAN ) 5-1.5 MG/5ML syrup   Has the patient contacted their pharmacy? Yes, no refills  This is the patient's preferred pharmacy:  [id number] [place], [place] - [address] [facility] [address] [place] [address] Phone: [phone] Fax: [phone]  Is this the correct pharmacy for this prescription? Yes  Has the prescription been filled recently? No  Is the patient out of the medication? Yes  Has the patient been seen for an appointment in the last year OR does the patient have an upcoming appointment? Yes  Can we respond through MyChart? Yes  Agent: Please be advised that Rx refills may take up to 3 business days. We ask that you follow-up with your pharmacy.

## 2024-04-11 MED ORDER — HYDROCODONE BIT-HOMATROP MBR 5-1.5 MG/5ML PO SOLN: 5.0000 mL | Freq: Four times a day (QID) | ORAL | 0 refills | Status: DC | PRN

## 2024-04-11 NOTE — Telephone Encounter (Signed)
 Refill for medication has been sent to pharmacy

## 2024-04-13 ENCOUNTER — Other Ambulatory Visit: Payer: Self-pay | Admitting: Internal Medicine

## 2024-04-13 DIAGNOSIS — E118 Type 2 diabetes mellitus with unspecified complications: Secondary | ICD-10-CM

## 2024-04-14 ENCOUNTER — Other Ambulatory Visit: Payer: Self-pay | Admitting: Internal Medicine

## 2024-04-18 MED ORDER — HYDROCODONE BIT-HOMATROP MBR 5-1.5 MG/5ML PO SOLN: 5.0000 mL | Freq: Four times a day (QID) | ORAL | 0 refills | Status: DC | PRN

## 2024-04-18 NOTE — Addendum Note (Signed)
 Addended byBETHA THEOPHILUS ROOSEVELT on: 04/18/2024 01:10 PM   Modules accepted: Orders

## 2024-04-18 NOTE — Progress Notes (Signed)
 I called and reviewed CT scan 03/29/2024 with patient.  CT shows progressive ILD and pulmonary fibrosis. Incidental findings of liver lesion is being worked up by primary care. Symptomatically is getting worse as well with worsening cough. I called in a prescription for Hycodan , echocardiogram ordered and referral made for [facility] transplant

## 2024-04-21 ENCOUNTER — Ambulatory Visit

## 2024-04-21 ENCOUNTER — Telehealth: Payer: Self-pay | Admitting: Internal Medicine

## 2024-04-21 NOTE — Telephone Encounter (Unsigned)
 Copied from CRM #[id number]. Topic: General - Running Late >> Apr 21, 2024 10:51 AM Tiffini S wrote: Patient/patient representative is calling because they are running late for an appointment. Will be about 15 minutes late- explained the 10 grace period and offered to reschedule if not arrived by 11:00am- patient spouse  Darice refused stating that the roads are bad but can still make it to the appointment  If needed, please call the patient back at [phone]

## 2024-04-24 ENCOUNTER — Ambulatory Visit: Admitting: Podiatry

## 2024-04-25 ENCOUNTER — Other Ambulatory Visit: Payer: Self-pay | Admitting: Pulmonary Disease

## 2024-04-26 ENCOUNTER — Telehealth: Payer: Self-pay

## 2024-04-26 NOTE — Telephone Encounter (Unsigned)
 Copied from CRM [id number]. Topic: Clinical - Medication Refill >> Apr 26, 2024 11:46 AM Rilla B wrote: Medication: HYDROcodone  bit-homatropine (HYCODAN ) 5-1.5 MG/5ML syrup  Has the patient contacted their pharmacy? No (Agent: If no, request that the patient contact the pharmacy for the refill. If patient does not wish to contact the pharmacy document the reason why and proceed with request.) (Agent: If yes, when and what did the pharmacy advise?)  This is the patient's preferred pharmacy:  [id number] [place], [place] - [address] [facility] [address] [place] [address] Phone: [phone] Fax: [phone]   Is this the correct pharmacy for this prescription? Yes If no, delete pharmacy and type the correct one.   Has the prescription been filled recently? Yes  Is the patient out of the medication? Yes  Has the patient been seen for an appointment in the last year OR does the patient have an upcoming appointment? Yes  Can we respond through MyChart? No  Agent: Please be advised that Rx refills may take up to 3 business days. We ask that you follow-up with your pharmacy.

## 2024-04-27 ENCOUNTER — Telehealth: Payer: Self-pay

## 2024-04-27 ENCOUNTER — Telehealth (HOSPITAL_BASED_OUTPATIENT_CLINIC_OR_DEPARTMENT_OTHER): Payer: Self-pay

## 2024-04-27 MED ORDER — HYDROCODONE BIT-HOMATROP MBR 5-1.5 MG/5ML PO SOLN: 5.0000 mL | Freq: Four times a day (QID) | ORAL | 0 refills | Status: DC | PRN

## 2024-04-27 NOTE — Telephone Encounter (Unsigned)
 Copied from CRM #[id number]. Topic: Clinical - Prescription Issue >> Apr 27, 2024 11:24 AM Ismael A wrote: Reason for CRM: [phone] - states pt has been constantly calling to get HYDROcodone  bit-homatropine (HYCODAN ) 5-1.5 MG/5ML syrup filled ASAP as soon as refill request is sent by us  and [facility] to [facility] on [facility], Karleen stated that the pt is also on Oxycodone  HCl 10 MG TABS oxyCODONE -acetaminophen  (PERCOCET/ROXICET) 5-325 MG tablet prescribed by another provider which he calls them on a daily basis for as well - Karleen is requesting assistance as it is becoming a nuisance for them and they are considering cutting ties with patient if this continues

## 2024-04-27 NOTE — Telephone Encounter (Signed)
 Copied from CRM #[id number]. Topic: Clinical - Prescription Issue >> Apr 27, 2024 11:24 AM Ismael A wrote: Reason for CRM: [facility] [phone] - states pt has been constantly calling to get HYDROcodone  bit-homatropine (HYCODAN ) 5-1.5 MG/5ML syrup filled ASAP as soon as refill request is sent by us  and [facility] is [facility] a [facility] basis to check on refill, Karleen stated that the pt is also on Oxycodone  HCl 10 MG TABS oxyCODONE -acetaminophen  (PERCOCET/ROXICET) 5-325 MG tablet prescribed by another provider which he calls them on a daily basis for as well - Karleen is requesting assistance as it is becoming a nuisance for them and they are considering cutting ties with patient if this continues    Dr. Bevin is aware

## 2024-04-27 NOTE — Telephone Encounter (Signed)
Refill has been called into the pharmacy.

## 2024-04-27 NOTE — Telephone Encounter (Signed)
 Pt notified per ov dated 04/27/2024

## 2024-04-27 NOTE — Telephone Encounter (Signed)
 Pt notified. NFN

## 2024-04-27 NOTE — Telephone Encounter (Signed)
 Copied from CRM [id number]. Topic: Clinical - Medication Refill >> Apr 26, 2024 11:46 AM Rilla B wrote: Medication: HYDROcodone  bit-homatropine (HYCODAN ) 5-1.5 MG/5ML syrup  Has the patient contacted their pharmacy? No (Agent: If no, request that the patient contact the pharmacy for the refill. If patient does not wish to contact the pharmacy document the reason why and proceed with request.) (Agent: If yes, when and what did the pharmacy advise?)  This is the patient's preferred pharmacy:  [id number] [place], [place] - [address] [facility] [address] [place] [address] Phone: [phone] Fax: [phone]   Is this the correct pharmacy for this prescription? Yes If no, delete pharmacy and type the correct one.   Has the prescription been filled recently? Yes  Is the patient out of the medication? Yes  Has the patient been seen for an appointment in the last year OR does the patient have an upcoming appointment? Yes  Can we respond through MyChart? No  Agent: Please be advised that Rx refills may take up to 3 business days. We ask that you follow-up with your pharmacy. >> Apr 27, 2024  9:11 AM CMA Warren ORN wrote: Duplicate >> Apr 26, 2024  4:17 PM Chantha C wrote: Patient is checking on HYDROcodone  bit-homatropine (HYCODAN ) 5-1.5 MG/5ML syrup if it was refilled? Informed patient, they're still working on it, a message was sent.    Already been addressed - see phone note from 04/26/24

## 2024-04-27 NOTE — Telephone Encounter (Signed)
 Duplicate   Copied from CRM #[id number]. Topic: Clinical - Medication Refill >> Apr 26, 2024 11:46 AM Rilla B wrote: Medication: HYDROcodone  bit-homatropine (HYCODAN ) 5-1.5 MG/5ML syrup  Has the patient contacted their pharmacy? No (Agent: If no, request that the patient contact the pharmacy for the refill. If patient does not wish to contact the pharmacy document the reason why and proceed with request.) (Agent: If yes, when and what did the pharmacy advise?)  This is the patient's preferred pharmacy:  [id number] [place], [place] - [address] [facility] [address] [place] [address] Phone: [phone] Fax: [phone]   Is this the correct pharmacy for this prescription? Yes If no, delete pharmacy and type the correct one.   Has the prescription been filled recently? Yes  Is the patient out of the medication? Yes  Has the patient been seen for an appointment in the last year OR does the patient have an upcoming appointment? Yes  Can we respond through MyChart? No  Agent: Please be advised that Rx refills may take up to 3 business days. We ask that you follow-up with your pharmacy. >> Apr 26, 2024  4:17 PM Chantha C wrote: Patient is checking on HYDROcodone  bit-homatropine (HYCODAN ) 5-1.5 MG/5ML syrup if it was refilled? Informed patient, they're still working on it, a message was sent.

## 2024-04-28 MED ORDER — HYDROCODONE BIT-HOMATROP MBR 5-1.5 MG/5ML PO SOLN: 5.0000 mL | Freq: Four times a day (QID) | ORAL | 0 refills | Status: AC | PRN

## 2024-04-28 NOTE — Telephone Encounter (Signed)
 I called and [facility] Patient will continue to need cough medication as he has severe cough from progressive IPF and Hycodan  is only thing that keeps it under control We have decided that we will give him a refills of 480 mL that should last him about 24 days and will not entertain any other refill requests before that.  This is to prevent him from calling every few weeks to get a refill.  I tried to call the patient to review this with him but got voicemail.  Left message to call back.  Lonna Coder MD [facility] 04/28/2024, 12:57 PM

## 2024-04-28 NOTE — Telephone Encounter (Signed)
 Spoke to patient regarding message. Patient verbalized agreement and understanding. NFn

## 2024-04-28 NOTE — Addendum Note (Signed)
 Addended byBETHA THEOPHILUS ROOSEVELT on: 04/28/2024 01:01 PM   Modules accepted: Orders

## 2024-05-01 ENCOUNTER — Ambulatory Visit: Admitting: Adult Health

## 2024-05-15 ENCOUNTER — Ambulatory Visit: Admitting: Pulmonary Disease

## 2024-05-19 ENCOUNTER — Ambulatory Visit: Admitting: Podiatry

## 2024-05-22 ENCOUNTER — Ambulatory Visit (HOSPITAL_COMMUNITY)

## 2024-05-23 ENCOUNTER — Ambulatory Visit: Admitting: Cardiology

## 2024-06-05 ENCOUNTER — Other Ambulatory Visit (HOSPITAL_COMMUNITY)

## 2024-06-05 ENCOUNTER — Ambulatory Visit: Admitting: Physician Assistant

## 2024-06-27 ENCOUNTER — Other Ambulatory Visit: Payer: Self-pay

## 2024-07-11 ENCOUNTER — Ambulatory Visit: Admitting: Family
